# Patient Record
Sex: Female | Born: 1940 | Race: White | Hispanic: No | Smoking: Former smoker
Health system: Southern US, Community
[De-identification: ages and names within clinical notes are randomized; demographics above are authoritative.]

## PROBLEM LIST (undated history)

## (undated) DIAGNOSIS — M797 Fibromyalgia: Secondary | ICD-10-CM

## (undated) DIAGNOSIS — Z8619 Personal history of other infectious and parasitic diseases: Secondary | ICD-10-CM

## (undated) DIAGNOSIS — M1990 Unspecified osteoarthritis, unspecified site: Secondary | ICD-10-CM

## (undated) DIAGNOSIS — K5792 Diverticulitis of intestine, part unspecified, without perforation or abscess without bleeding: Secondary | ICD-10-CM

## (undated) DIAGNOSIS — H3530 Unspecified macular degeneration: Secondary | ICD-10-CM

## (undated) DIAGNOSIS — IMO0001 Reserved for inherently not codable concepts without codable children: Secondary | ICD-10-CM

## (undated) DIAGNOSIS — K219 Gastro-esophageal reflux disease without esophagitis: Secondary | ICD-10-CM

## (undated) DIAGNOSIS — I10 Essential (primary) hypertension: Secondary | ICD-10-CM

## (undated) DIAGNOSIS — I739 Peripheral vascular disease, unspecified: Secondary | ICD-10-CM

## (undated) DIAGNOSIS — R0600 Dyspnea, unspecified: Secondary | ICD-10-CM

## (undated) DIAGNOSIS — G459 Transient cerebral ischemic attack, unspecified: Secondary | ICD-10-CM

## (undated) DIAGNOSIS — I6529 Occlusion and stenosis of unspecified carotid artery: Secondary | ICD-10-CM

## (undated) DIAGNOSIS — I8390 Asymptomatic varicose veins of unspecified lower extremity: Secondary | ICD-10-CM

## (undated) DIAGNOSIS — I2510 Atherosclerotic heart disease of native coronary artery without angina pectoris: Secondary | ICD-10-CM

## (undated) DIAGNOSIS — IMO0002 Reserved for concepts with insufficient information to code with codable children: Secondary | ICD-10-CM

## (undated) DIAGNOSIS — C801 Malignant (primary) neoplasm, unspecified: Secondary | ICD-10-CM

## (undated) DIAGNOSIS — E785 Hyperlipidemia, unspecified: Secondary | ICD-10-CM

## (undated) DIAGNOSIS — H9190 Unspecified hearing loss, unspecified ear: Secondary | ICD-10-CM

## (undated) DIAGNOSIS — R011 Cardiac murmur, unspecified: Secondary | ICD-10-CM

## (undated) DIAGNOSIS — H269 Unspecified cataract: Secondary | ICD-10-CM

## (undated) HISTORY — DX: Unspecified cataract: H26.9

## (undated) HISTORY — DX: Unspecified hearing loss, unspecified ear: H91.90

## (undated) HISTORY — DX: Reserved for concepts with insufficient information to code with codable children: IMO0002

## (undated) HISTORY — DX: Transient cerebral ischemic attack, unspecified: G45.9

## (undated) HISTORY — DX: Personal history of other infectious and parasitic diseases: Z86.19

## (undated) HISTORY — DX: Reserved for inherently not codable concepts without codable children: IMO0001

## (undated) HISTORY — DX: Asymptomatic varicose veins of unspecified lower extremity: I83.90

## (undated) HISTORY — PX: OTHER SURGICAL HISTORY: SHX169

## (undated) HISTORY — DX: Essential (primary) hypertension: I10

## (undated) HISTORY — PX: BREAST BIOPSY: SHX20

## (undated) HISTORY — DX: Gastro-esophageal reflux disease without esophagitis: K21.9

## (undated) HISTORY — DX: Unspecified osteoarthritis, unspecified site: M19.90

## (undated) HISTORY — DX: Occlusion and stenosis of unspecified carotid artery: I65.29

## (undated) HISTORY — PX: APPENDECTOMY: SHX54

## (undated) HISTORY — DX: Atherosclerotic heart disease of native coronary artery without angina pectoris: I25.10

## (undated) HISTORY — DX: Malignant (primary) neoplasm, unspecified: C80.1

## (undated) HISTORY — DX: Diverticulitis of intestine, part unspecified, without perforation or abscess without bleeding: K57.92

## (undated) HISTORY — DX: Dyspnea, unspecified: R06.00

## (undated) HISTORY — DX: Hyperlipidemia, unspecified: E78.5

## (undated) HISTORY — DX: Peripheral vascular disease, unspecified: I73.9

## (undated) HISTORY — DX: Fibromyalgia: M79.7

## (undated) HISTORY — PX: ANGIOPLASTY: SHX39

## (undated) HISTORY — DX: Unspecified macular degeneration: H35.30

## (undated) HISTORY — PX: CARDIAC CATHETERIZATION: SHX172

---

## 1971-03-12 HISTORY — PX: TOTAL ABDOMINAL HYSTERECTOMY: SHX209

## 1986-03-11 HISTORY — PX: KIDNEY STONE SURGERY: SHX686

## 1995-03-12 HISTORY — PX: SPINE SURGERY: SHX786

## 2001-06-09 ENCOUNTER — Encounter: Payer: Self-pay | Admitting: Vascular Surgery

## 2001-06-11 ENCOUNTER — Ambulatory Visit (HOSPITAL_COMMUNITY): Admission: RE | Admit: 2001-06-11 | Discharge: 2001-06-11 | Payer: Self-pay | Admitting: Vascular Surgery

## 2001-07-02 ENCOUNTER — Ambulatory Visit (HOSPITAL_COMMUNITY): Admission: RE | Admit: 2001-07-02 | Discharge: 2001-07-03 | Payer: Self-pay | Admitting: Vascular Surgery

## 2003-01-03 ENCOUNTER — Ambulatory Visit (HOSPITAL_COMMUNITY): Admission: RE | Admit: 2003-01-03 | Discharge: 2003-01-03 | Payer: Self-pay | Admitting: Internal Medicine

## 2005-12-09 ENCOUNTER — Ambulatory Visit: Payer: Self-pay | Admitting: Cardiology

## 2005-12-13 ENCOUNTER — Ambulatory Visit: Payer: Self-pay | Admitting: Cardiology

## 2005-12-18 ENCOUNTER — Inpatient Hospital Stay (HOSPITAL_BASED_OUTPATIENT_CLINIC_OR_DEPARTMENT_OTHER): Admission: RE | Admit: 2005-12-18 | Discharge: 2005-12-18 | Payer: Self-pay | Admitting: Internal Medicine

## 2005-12-18 ENCOUNTER — Ambulatory Visit: Payer: Self-pay | Admitting: Cardiology

## 2006-01-16 ENCOUNTER — Ambulatory Visit: Payer: Self-pay | Admitting: Cardiology

## 2006-02-21 ENCOUNTER — Ambulatory Visit: Payer: Self-pay | Admitting: Cardiology

## 2006-04-02 ENCOUNTER — Encounter: Payer: Self-pay | Admitting: Cardiology

## 2006-08-19 ENCOUNTER — Ambulatory Visit: Payer: Self-pay | Admitting: Vascular Surgery

## 2006-11-25 ENCOUNTER — Ambulatory Visit: Payer: Self-pay | Admitting: Vascular Surgery

## 2006-11-28 ENCOUNTER — Ambulatory Visit: Payer: Self-pay

## 2006-12-03 ENCOUNTER — Ambulatory Visit: Payer: Self-pay | Admitting: Vascular Surgery

## 2006-12-03 ENCOUNTER — Inpatient Hospital Stay (HOSPITAL_COMMUNITY): Admission: RE | Admit: 2006-12-03 | Discharge: 2006-12-04 | Payer: Self-pay | Admitting: Vascular Surgery

## 2006-12-03 ENCOUNTER — Encounter: Payer: Self-pay | Admitting: Vascular Surgery

## 2006-12-03 HISTORY — PX: CAROTID ENDARTERECTOMY: SUR193

## 2006-12-23 ENCOUNTER — Ambulatory Visit: Payer: Self-pay | Admitting: Vascular Surgery

## 2007-03-12 HISTORY — PX: KNEE ARTHROSCOPY: SUR90

## 2007-06-30 ENCOUNTER — Ambulatory Visit: Payer: Self-pay | Admitting: Vascular Surgery

## 2007-12-07 ENCOUNTER — Encounter: Payer: Self-pay | Admitting: Cardiology

## 2008-01-08 ENCOUNTER — Ambulatory Visit: Payer: Self-pay | Admitting: Vascular Surgery

## 2008-02-19 ENCOUNTER — Encounter: Payer: Self-pay | Admitting: Cardiology

## 2008-05-19 ENCOUNTER — Encounter: Payer: Self-pay | Admitting: Cardiology

## 2008-07-26 ENCOUNTER — Ambulatory Visit: Payer: Self-pay | Admitting: Cardiology

## 2008-10-11 ENCOUNTER — Encounter: Payer: Self-pay | Admitting: Cardiology

## 2008-11-22 DIAGNOSIS — R0602 Shortness of breath: Secondary | ICD-10-CM | POA: Insufficient documentation

## 2008-11-22 DIAGNOSIS — E782 Mixed hyperlipidemia: Secondary | ICD-10-CM

## 2008-11-22 DIAGNOSIS — I10 Essential (primary) hypertension: Secondary | ICD-10-CM

## 2008-11-22 DIAGNOSIS — I739 Peripheral vascular disease, unspecified: Secondary | ICD-10-CM

## 2008-12-26 ENCOUNTER — Ambulatory Visit: Payer: Self-pay | Admitting: Vascular Surgery

## 2008-12-26 ENCOUNTER — Encounter: Payer: Self-pay | Admitting: Cardiology

## 2009-03-10 ENCOUNTER — Encounter: Payer: Self-pay | Admitting: Cardiology

## 2009-03-11 HISTORY — PX: JOINT REPLACEMENT: SHX530

## 2009-06-20 ENCOUNTER — Ambulatory Visit: Payer: Self-pay | Admitting: Vascular Surgery

## 2009-06-28 ENCOUNTER — Encounter: Payer: Self-pay | Admitting: Cardiology

## 2009-07-31 ENCOUNTER — Ambulatory Visit: Payer: Self-pay | Admitting: Cardiology

## 2009-07-31 DIAGNOSIS — I2510 Atherosclerotic heart disease of native coronary artery without angina pectoris: Secondary | ICD-10-CM

## 2010-01-19 ENCOUNTER — Telehealth (INDEPENDENT_AMBULATORY_CARE_PROVIDER_SITE_OTHER): Payer: Self-pay | Admitting: *Deleted

## 2010-01-26 ENCOUNTER — Ambulatory Visit: Payer: Self-pay | Admitting: Internal Medicine

## 2010-01-26 ENCOUNTER — Inpatient Hospital Stay (HOSPITAL_COMMUNITY)
Admission: RE | Admit: 2010-01-26 | Discharge: 2010-01-29 | Payer: Self-pay | Source: Home / Self Care | Admitting: Orthopedic Surgery

## 2010-02-27 ENCOUNTER — Ambulatory Visit: Payer: Self-pay | Admitting: Vascular Surgery

## 2010-03-16 ENCOUNTER — Telehealth (INDEPENDENT_AMBULATORY_CARE_PROVIDER_SITE_OTHER): Payer: Self-pay | Admitting: *Deleted

## 2010-04-12 NOTE — Progress Notes (Signed)
Summary: med change  Phone Note Call from Patient   Summary of Call: States she has been on Adalat CC 30mg .  Pharm has always filled with Nifediac.  Insurance will no longer cover this.  Okay to change to Nifedipine ER 30mg  daily.  Advised her that is probably okay, but will double check with MD.  Has about 6 pills left on old rx.  Patient verbalized understanding.  Initial call taken by: Hoover Brunette, LPN,  March 16, 2010 2:32 PM  Follow-up for Phone Call        yes no problem Follow-up by: Lewayne Bunting, MD, [facility],  March 16, 2010 2:51 PM  Additional Follow-up for Phone Call Additional follow up Details #1::        Patient notified.   Will send to [facility] at patient request. Additional Follow-up by: Hoover Brunette, LPN,  March 19, 2010 5:56 PM    New/Updated Medications: NIFEDIPINE 30 MG XR24H-TAB (NIFEDIPINE) Take 1 tablet by mouth once a day Prescriptions: NIFEDIPINE 30 MG XR24H-TAB (NIFEDIPINE) Take 1 tablet by mouth once a day  #30 x 6   Entered by:   Hoover Brunette, LPN   Authorized by:   Lewayne Bunting, MD, [facility]   Signed by:   Hoover Brunette, LPN on 52/84/1324   Method used:   Electronically to        [facility]* (retail)       [address]       [place], [place]  [address]       Ph: [phone]       Fax: [phone]   RxID:   [id number]

## 2010-04-12 NOTE — Assessment & Plan Note (Signed)
Summary: 1 YR FU PER MAY REMINDER   Visit Type:  Follow-up Primary Provider:  Dr. Kirstie Peri   History of Present Illness: the patient is a very pleasant 70 year old female with a history of single-vessel coronary artery disease with without recurrent chest pain. Her last cardiac catheterization was in 2007. Chest is of chronic dependent edema secondary to venous insufficiency. She also states her hypertension. She has chronic knee problems and is limited in her mobility. She has a historyof  vascular disease and is status post left carotid endarterectomy as well as angioplasty of the left lower extremity. She has type 2 diabetes mellitus hypertension and dyslipidemia.  The patient reports no cardiovascular symptoms. She denies any chest pain shortness of breath orthopnea PND. She does complain of significant fatigue which has worsened over the last 4 months. Unfortunately a significant insomnia and does not take her trazodone every night. Sometimes she takes melatonin. She has started exercising slowly but is unable to do much given her chronic knee problems. She also reports some indigestion likely related to her Fosamax. She also has fibromyalgias for which he takes Flexeril at times, which could also contribute to her fatigue.  Preventive Screening-Counseling & Management  Alcohol-Tobacco     Smoking Status: quit  Comments: quit smoking about 25 yrs ago, started smoking at 70 yrs old  Current Medications (verified): 1)  Prilosec Otc 20 Mg Tbec (Omeprazole Magnesium) .... Take 1 Tablet By Mouth Once A Day 2)  Demadex 20 Mg Tabs (Torsemide) .... Take 1-2 Tablets By Mouth Once Daily. 3)  Valturna 300-320 Mg Tabs (Aliskiren-Valsartan) .... Take 1 Tablet By Mouth Once A Day 4)  Crestor 10 Mg Tabs (Rosuvastatin Calcium) .... Take One By Mouth 3 Times Week 5)  Metoprolol Succinate 25 Mg Xr24h-Tab (Metoprolol Succinate) .... Take 1 Tablet By Mouth Once A Day 6)  Trazodone Hcl 50 Mg Tabs  (Trazodone Hcl) .... Take 1 Tablet By Mouth Once A Day As Needed 7)  Caltrate 600+d 600-400 Mg-Unit Tabs (Calcium Carbonate-Vitamin D) .... Take 1 Tablet By Mouth Four Times A Day 8)  Mag-Ox 400 400 Mg Tabs (Magnesium Oxide) .... Take 1 Tablet By Mouth Once A Day 9)  Folic Acid 800 Mcg Tabs (Folic Acid) .... Take 1 Tablet By Mouth Once A Day 10)  Fish Oil 1000 Mg Caps (Omega-3 Fatty Acids) .... Take 1 Tablet By Mouth Once A Day 11)  Ecotrin 325 Mg Tbec (Aspirin) .... Take 1 Tablet By Mouth Once A Day 12)  Multivitamins  Tabs (Multiple Vitamin) .... Take 1 Tablet By Mouth Once A Day 13)  Osteo Bi-Flex Regular Strength 250-200 Mg Tabs (Glucosamine-Chondroitin) .... Take 2 Tablets By Mouth Once A Day 14)  Vitamin D 1000 Unit Tabs (Cholecalciferol) .... Take 2 Tablet By Mouth Once A Day 15)  Adalat Cc 30 Mg Xr24h-Tab (Nifedipine) .... Take 1 Tablet By Mouth Once A Day 16)  Clonidine Hcl 0.1 Mg Tabs (Clonidine Hcl) .... Take One Tablet By Mouth Once Daily 17)  Januvia 100 Mg Tabs (Sitagliptin Phosphate) .... Take 1 Tablet By Mouth Once A Day 18)  Cyclobenzaprine Hcl 10 Mg Tabs (Cyclobenzaprine Hcl) .... Take 1 Tab By Mouth At Bedtime 19)  Alendronate Sodium 70 Mg Tabs (Alendronate Sodium) .... Take 1 Tablet By Mouth Once Per Week. 20)  Oxycodone-Acetaminophen 5-325 Mg Tabs (Oxycodone-Acetaminophen) .... As Needed 21)  Gaviscon Extra Relief Formula 160-105 Mg Chew (Alum Hydroxide-Mag Carbonate) .... As Needed  Allergies (verified): 1)  Septra Ds 2)  Erythromycin 3)  Motrin 4)  Prednisone 5)  Naprosyn 6)  Augmentin  Comments:  Nurse/Medical Assistant: The patient's medications were reviewed with the patient and were updated in the Medication List. Pt brought a list of medications to office visit.  Cyril Loosen, RN, BSN (Jul 31, 2009 10:28 AM)  Past History:  Past Medical History: Last updated: 11/22/2008 HYPERTENSION, UNSPECIFIED (ICD-401.9) HYPERLIPIDEMIA-MIXED (ICD-272.4) PVD  (ICD-443.9) DYSPNEA (ICD-786.05) Type 2 diabetes mellitus  Past Surgical History: Last updated: 11/22/2008 Breast biopsy.  Kidney stones.  Aspiration of a cyst in the right breast.  Abdominal Hysterectomy-Total Appendectomy Back Surgery  Family History: Last updated: 11/22/2008 Family History of Coronary Artery Disease:  Family History of CVA or Stroke:   Social History: Last updated: 11/22/2008 Retired  Married  Tobacco Use - Former.  Alcohol Use - no  Risk Factors: Smoking Status: quit (07/31/2009)  Vital Signs:  Patient profile:   70 year old female Height:      63 inches Weight:      209.50 pounds BMI:     37.25 Pulse rate:   60 / minute BP sitting:   116 / 79  (left arm) Cuff size:   large  Vitals Entered By: Cyril Loosen, RN, BSN (Jul 31, 2009 10:16 AM)  Nutrition Counseling: Patient's BMI is greater than 25 and therefore counseled on weight management options. Comments 1 yr follow up    Physical Exam  Additional Exam:  General: Well-developed, well-nourished in no distress head: Normocephalic and atraumatic eyes PERRLA/EOMI intact, conjunctiva and lids normal nose: No deformity or lesions mouth normal dentition, normal posterior pharynx neck: Supple, no JVD.  No masses, thyromegaly or abnormal cervical nodesStatus post left carotid endarterectomy lungs: Normal breath sounds bilaterally without wheezing.  Normal percussion heart: regular rate and rhythm with normal S1 and S2, no S3 or S4.  PMI is normal.  No pathological murmurs abdomen: Normal bowel sounds, abdomen is soft and nontender without masses, organomegaly or hernias noted.  No hepatosplenomegaly musculoskeletal: Back normal, normal gait muscle strength and tone normal pulsus: Pulse is normal in all 4 extremities Extremities: No peripheral pitting edema neurologic: Alert and oriented x 3 skin: Intact without lesions or rashes cervical nodes: No significant adenopathy psychologic: Normal  affect    Impression & Recommendations:  Problem # 1:  HYPERTENSION, UNSPECIFIED (ICD-401.9) nifedipine was refilled today The following medications were removed from the medication list:    Amlodipine Besylate 5 Mg Tabs (Amlodipine besylate) .Marland Kitchen... Take 1 tablet by mouth once a day Her updated medication list for this problem includes:    Demadex 20 Mg Tabs (Torsemide) .Marland Kitchen... Take 1-2 tablets by mouth once daily.    Valturna 300-320 Mg Tabs (Aliskiren-valsartan) .Marland Kitchen... Take 1 tablet by mouth once a day    Metoprolol Succinate 25 Mg Xr24h-tab (Metoprolol succinate) .Marland Kitchen... Take 1 tablet by mouth once a day    Ecotrin 325 Mg Tbec (Aspirin) .Marland Kitchen... Take 1 tablet by mouth once a day    Adalat Cc 30 Mg Xr24h-tab (Nifedipine) .Marland Kitchen... Take 1 tablet by mouth once a day    Clonidine Hcl 0.1 Mg Tabs (Clonidine hcl) .Marland Kitchen... Take one tablet by mouth once daily  Problem # 2:  PVD (ICD-443.9) Assessment: Comment Only status post left carotid endarterectomy and status post PTA of the left lower extremity.  Problem # 3:  CAD (ICD-414.00) single-vessel coronary artery is a snow recurrent chest pain. Continue current medical therapy The following medications were removed from the medication list:  Amlodipine Besylate 5 Mg Tabs (Amlodipine besylate) .Marland Kitchen... Take 1 tablet by mouth once a day Her updated medication list for this problem includes:    Metoprolol Succinate 25 Mg Xr24h-tab (Metoprolol succinate) .Marland Kitchen... Take 1 tablet by mouth once a day    Ecotrin 325 Mg Tbec (Aspirin) .Marland Kitchen... Take 1 tablet by mouth once a day    Adalat Cc 30 Mg Xr24h-tab (Nifedipine) .Marland Kitchen... Take 1 tablet by mouth once a day  Patient Instructions: 1)  Your physician recommends that you continue on your current medications as directed. Please refer to the Current Medication list given to you today. 2)  Follow up in  1 year Prescriptions: ADALAT CC 30 MG XR24H-TAB (NIFEDIPINE) Take 1 tablet by mouth once a day  #30 x 11   Entered by:    Hoover Brunette, LPN   Authorized by:   Lewayne Bunting, MD, [facility]   Signed by:   Hoover Brunette, LPN on 16/12/9602   Method used:   Electronically to        [facility]* (retail)       [address]       [place], [place]  [address]       Ph: [phone]       Fax: [phone]   RxID:   [id number]

## 2010-04-12 NOTE — Progress Notes (Signed)
Summary: Records Request  Faxed 2008 Stress to [facility] [place] [facility] ([phone]). Debby Freiberg  January 19, 2010 11:59 AM

## 2010-05-22 LAB — BASIC METABOLIC PANEL
BUN: 14 mg/dL (ref 6–23)
CO2: 25 mEq/L (ref 19–32)
Calcium: 8.3 mg/dL — ABNORMAL LOW (ref 8.4–10.5)
Chloride: 103 mEq/L (ref 96–112)
Creatinine, Ser: 1.24 mg/dL — ABNORMAL HIGH (ref 0.4–1.2)
GFR calc Af Amer: 56 mL/min — ABNORMAL LOW (ref >60)
GFR calc non Af Amer: 46 mL/min — ABNORMAL LOW (ref >60)
Glucose, Bld: 156 mg/dL — ABNORMAL HIGH (ref 70–99)
Potassium: 3.7 mEq/L (ref 3.5–5.1)
Potassium: 4.1 mEq/L (ref 3.5–5.1)
Sodium: 139 mEq/L (ref 135–145)

## 2010-05-22 LAB — PROTIME-INR
INR: 0.98 (ref 0.00–1.49)
INR: 1.13 (ref 0.00–1.49)
Prothrombin Time: 13.2 seconds (ref 11.6–15.2)
Prothrombin Time: 14.7 seconds (ref 11.6–15.2)

## 2010-05-22 LAB — CBC
HCT: 23.5 % — ABNORMAL LOW (ref 36.0–46.0)
HCT: 25 % — ABNORMAL LOW (ref 36.0–46.0)
HCT: 26 % — ABNORMAL LOW (ref 36.0–46.0)
HCT: 33.8 % — ABNORMAL LOW (ref 36.0–46.0)
Hemoglobin: 9.1 g/dL — ABNORMAL LOW (ref 12.0–15.0)
MCH: 31.7 pg (ref 26.0–34.0)
MCH: 32 pg (ref 26.0–34.0)
MCHC: 34.9 g/dL (ref 30.0–36.0)
MCHC: 35.2 g/dL (ref 30.0–36.0)
MCV: 92.5 fL (ref 78.0–100.0)
MCV: 92.7 fL (ref 78.0–100.0)
MCV: 92.9 fL (ref 78.0–100.0)
Platelets: 221 10*3/uL (ref 150–400)
Platelets: 327 10*3/uL (ref 150–400)
RBC: 2.83 MIL/uL — ABNORMAL LOW (ref 3.87–5.11)
RBC: 3.64 MIL/uL — ABNORMAL LOW (ref 3.87–5.11)
RDW: 14.4 % (ref 11.5–15.5)
RDW: 14.6 % (ref 11.5–15.5)
WBC: 10 10*3/uL (ref 4.0–10.5)
WBC: 8.9 10*3/uL (ref 4.0–10.5)

## 2010-05-22 LAB — URINALYSIS, ROUTINE W REFLEX MICROSCOPIC
Bilirubin Urine: NEGATIVE
Glucose, UA: NEGATIVE mg/dL
Ketones, ur: NEGATIVE mg/dL
Protein, ur: NEGATIVE mg/dL

## 2010-05-22 LAB — GLUCOSE, CAPILLARY
Glucose-Capillary: 117 mg/dL — ABNORMAL HIGH (ref 70–99)
Glucose-Capillary: 132 mg/dL — ABNORMAL HIGH (ref 70–99)
Glucose-Capillary: 137 mg/dL — ABNORMAL HIGH (ref 70–99)
Glucose-Capillary: 138 mg/dL — ABNORMAL HIGH (ref 70–99)
Glucose-Capillary: 150 mg/dL — ABNORMAL HIGH (ref 70–99)
Glucose-Capillary: 156 mg/dL — ABNORMAL HIGH (ref 70–99)
Glucose-Capillary: 165 mg/dL — ABNORMAL HIGH (ref 70–99)
Glucose-Capillary: 170 mg/dL — ABNORMAL HIGH (ref 70–99)
Glucose-Capillary: 175 mg/dL — ABNORMAL HIGH (ref 70–99)

## 2010-05-22 LAB — COMPREHENSIVE METABOLIC PANEL
Alkaline Phosphatase: 56 U/L (ref 39–117)
BUN: 25 mg/dL — ABNORMAL HIGH (ref 6–23)
CO2: 30 mEq/L (ref 19–32)
Chloride: 103 mEq/L (ref 96–112)
Creatinine, Ser: 1.19 mg/dL (ref 0.4–1.2)
GFR calc non Af Amer: 45 mL/min — ABNORMAL LOW (ref >60)
Glucose, Bld: 107 mg/dL — ABNORMAL HIGH (ref 70–99)
Potassium: 4.9 mEq/L (ref 3.5–5.1)
Total Bilirubin: 0.5 mg/dL (ref 0.3–1.2)

## 2010-05-22 LAB — URINE MICROSCOPIC-ADD ON

## 2010-05-22 LAB — TYPE AND SCREEN
ABO/RH(D): A POS
Antibody Screen: NEGATIVE

## 2010-05-22 LAB — APTT: aPTT: 29 seconds (ref 24–37)

## 2010-07-24 NOTE — Procedures (Signed)
CAROTID DUPLEX EXAM   INDICATION:  Follow up carotid disease, left side endarterectomy.   HISTORY:  Diabetes:  yes  Cardiac:  no  Hypertension:  yes  Smoking:  Previous, quit 25 years ago  Previous Surgery:  Left CEA on 11/30/2006  CV History:  Multiple TIAs  Amaurosis Fugax No, Paresthesias No, Hemiparesis No                                       RIGHT             LEFT  Brachial systolic pressure:         167               153  Brachial Doppler waveforms:         wnl               wnl  Vertebral direction of flow:        antegrade         antegrade  DUPLEX VELOCITIES (cm/sec)  CCA peak systolic                   124               121  ECA peak systolic                   212               143  ICA peak systolic                   94                154  ICA end diastolic                   25                23  PLAQUE MORPHOLOGY:                  calcific          heterogenous  PLAQUE AMOUNT:                      minimal           minimal  PLAQUE LOCATION:                    ICA/ECA           ICA   IMPRESSION:  1. Right internal carotid artery shows evidence of 20% to 39%      stenosis.  2. Patent left endarterectomy site.  3. Left internal carotid artery shows evidence of 40% to 59% stenosis      (low end of range).  4. Bilateral external carotid artery stenosis.   ___________________________________________  [name], M.D.   CB/MEDQ  D:  12/26/2008  T:  12/27/2008  Job:  [id number]

## 2010-07-24 NOTE — H&P (Signed)
HISTORY AND PHYSICAL EXAMINATION   November 25, 2006   Re:  Sava, [place] L                  DOB:  Feb 28, 1941   CHIEF COMPLAINT:  Severe left internal carotid stenosis, asymptomatic.   HISTORY OF PRESENT ILLNESS:  This 70 year old female patient has a  history of vascular occlusive disease, having previously undergone a PTA  of her left superficial femoral artery by Dr. Hart Rochester in 2003 for severe  claudication symptoms in the left calf.  These symptoms have remained  relieved following that procedure.  She has been known to have some  carotid occlusive disease and has been followed by Dr. Eliberto Ivory  previously, and she recently had a duplex scan ordered by Dr. Sherryll Burger on  September 15th which revealed severe progression of disease in her left  internal carotid artery to a 90% stenosis and severity.  She has minimal  disease on the right side.  She denied any hemispheric __________, TIAs,  amaurosis fugax, diplopia, blurred vision, or syncope.  She is now  scheduled for an elective left carotid endarterectomy for this  asymptomatic lesion.   PAST MEDICAL HISTORY:  1. Type 2 diabetes mellitus.  2. Hypertension.  3. Hyperlipidemia.  4. Coronary artery disease.  Previous cardiac cath by Dr. Andee Lineman with      a few blockages which were not severe.  She is currently      asymptomatic.  5. Negative for COPD, stroke.   PAST SURGICAL HISTORY:  1. Hysterectomy.  2. Appendectomy.  3. Breast biopsy.  4. Kidney stones.  5. Aspiration of a cyst in the right breast.  6. Surgery on the cervical disk at C5-6.  7. Drainage of an abscess and appendectomy.   FAMILY HISTORY:  Positive for stroke in her mother.  Coronary artery  disease and diabetes in her father as well as a stroke in her father.   SOCIAL HISTORY:  She is married, has one child, and is retired.  She has  not smoked in 21 years and does not use alcohol.   REVIEW OF SYSTEMS:  Denies anorexia, weight loss, fever,  chest pain,  dyspnea on exertion but does have some palpitations on occasion.  She  also has symptoms of reflux esophagitis on occasion.   ALLERGIES:  She has sensitivity to SEPTRA DS, ERYTHROMYCIN, MOTRIN,  PREDNISONE, NAPROSYN, AUGMENTIN, METOPROLOL.   PREVIOUS MEDICATIONS:  1. Prilosec OTC 20 mg a day.  2. Demadex 20 mg a day.  3. Sular 40 mg a day.  4. Diovan 320 mg a day.  5. Glucophage XR 500 mg a day.  6. Crestor 10 mg a day.  7. Propoxyphene 65/650 p.r.n.  8. Oxycodone 5/325 p.r.n.  9. Ambien 10 mg 3-4 per month.  10.Citrucel supplement with vitamin D 500 mg 2 daily.  11.Omega III 1000 mg a day.  12.Ecotrin 325 mg a day.  13.Women's Golden multivitamin with minerals 1 every other day.  14.Gaviscon p.r.n.  15.Atenolol 25 mg per day.   PHYSICAL EXAMINATION:  Blood pressure is 122/60, heart rate 68,  respirations 16.  GENERAL:  She is a healthy-appearing female in no apparent distress.  Alert and oriented x3.  NECK:  Supple with 3+ carotid pulses.  There is a soft bruit on the left  side.  No palpable adenopathy in the neck.  NEUROLOGIC:  Normal.  CHEST:  Clear to auscultation.  CARDIOVASCULAR:  Regular rhythm with no murmurs.  ABDOMEN:  Soft and nontender with no masses.  She has 3+ femoral and 2+ popliteal pulses palpable bilaterally.  Both  feet are well perfused.   I reviewed the carotid duplex exam performed at [facility],  which revealed a 90% left internal carotid stenosis and mild flow  reduction on the right side.   IMPRESSION:  1. Left internal carotid stenosis, severe.  Asymptomatic.  2. Type 2 diabetes mellitus.  3. Hypertension.  4. Hyperlipidemia.  5. Coronary artery disease, currently asymptomatic.   PLAN:  Admit the patient on September 24th for an elective left carotid  endarterectomy.  She will have a Cardiolite study performed between now  and that date, to be reviewed by the cardiologist to see if any  preoperative evaluation is  indicated.  Risks and benefits of the surgery  were thoroughly discussed, and she would like to proceed.   [name], M.D.  Electronically Signed   JDL/MEDQ  D:  11/25/2006  T:  11/26/2006  Job:  [id number]   cc:   Kirstie Peri, MD

## 2010-07-24 NOTE — Procedures (Signed)
CAROTID DUPLEX EXAM   INDICATION:  Follow up carotid artery disease.   HISTORY:  Diabetes:  Yes.  Cardiac:  Murmur.  Hypertension:  Yes.  Smoking:  Quit.  Previous Surgery:  Left CEA with DPA on 11/30/2006 by Dr. Hart Rochester.  CV History:  No.  Amaurosis Fugax No, Paresthesias No, Hemiparesis No.                                       RIGHT             LEFT  Brachial systolic pressure:         153               139  Brachial Doppler waveforms:         Biphasic          Biphasic  Vertebral direction of flow:        Antegrade         Antegrade  DUPLEX VELOCITIES (cm/sec)  CCA peak systolic                   85                108  ECA peak systolic                   214               195  ICA peak systolic                   93                143  ICA end diastolic                   29                45  PLAQUE MORPHOLOGY:                  Calcific          Homogenous  PLAQUE AMOUNT:                      Minimal           Minimal  PLAQUE LOCATION:                    ICA               ICA   IMPRESSION:  1. Right internal carotid artery shows evidence of 20-39% stenosis.  2. Left internal carotid artery velocities, status post carotid      endarterectomy, are suggestive of 40-59% stenosis (low end of      range).  3. No significant internal carotid artery velocity changes from      previous study.  4. Bilateral external carotid artery stenosis.   ___________________________________________  [name], M.D.   AS/MEDQ  D:  01/08/2008  T:  01/08/2008  Job:  [id number]

## 2010-07-24 NOTE — Assessment & Plan Note (Signed)
[facility]                          [facility] OFFICE NOTE   NAME:Canizares, DONOVAN GATCHEL                         MRN:          [id number]  DATE:07/26/2008                            DOB:          Dec 01, 1940    HISTORY OF PRESENT ILLNESS:  The patient is a 70 year old female with a  history of nonobstructive coronary artery disease.  The patient  underwent cardiac catheterization in 2007.  She is here for routine  followup.  Unfortunately, she remains quite hypertensive.  She denies  any chest pain.  She does report shortness of breath on moderate  exertion.  This has been a chronic problem; however, she has not seen a  change in her exercise tolerance overall.  Her husband reminded that our  office did not call for routine followup in 1 year.  Prior  echocardiographic study has also shown normal cardiac function and no  valvular heart disease.   MEDICATIONS:  1. Prilosec over-the-counter 20 mg p.o. daily.  2. Demadex 20 mg daily may take an extra p.r.n.  3. Valturna 300/320 mg p.o. daily.  4. Active plus met 50/850 mg p.o. daily.  5. Crestor 10 mg 3 times a week.  6. Metoprolol ER 50 mg p.o. daily.  7. Trazodone 50 mg p.o. nightly.  8. Calcium.  9. Vitamin D.  10.Magnesium t.i.d.  11.Folic acid 800 mcg p.o. daily.  12.Omega-3 fish oil.  13.Ecotrin 325 daily.  14.Multivitamin.  15.Osteo Bi-Flex daily.  16.Vitamin D 2000 units daily.   PHYSICAL EXAMINATION:  VITAL SIGNS:  Blood pressure is 186/88 in the  left arm and 110/78.  On repeat, heart rate 49, weight 197 pounds.  GENERAL:  Well-nourished, white female, in no apparent distress.  HEENT:  Pupils, eyes are clear.  Conjunctivae clear.  NECK:  Supple.  Normal carotid upstroke and no carotid bruits.  LUNGS:  Clear breath sounds bilaterally.  HEART:  Regular rate and rhythm with normal S1 and S2, and no murmur,  rubs, or gallops.  ABDOMEN:  Soft, nontender with no rebound or guarding.  Good  bowel  sounds.  EXTREMITIES:  No cyanosis, clubbing, or edema.   PROBLEM LIST:  1. Dyspnea stable.      a.     Nonobstructive coronary artery disease by catheterization       (70% mid right coronary artery stenosis).      b.     Normal left ventricular function.      c.     Deconditioning.      d.     Limited mobility due to chronic right knee condition.  2. Peripheral vascular disease.      a.     Status post left carotid endarterectomy.      b.     Status post percutaneous transluminal angioplasty left lower       extremity by Dr. Hart Rochester.  3. Type 2 diabetes mellitus.  4. Hypertension.  5. Dyslipidemia.   PLAN:  1. The patient's blood pressure is poorly controlled.  We will add      Adalat 30 mg  p.o. daily to her medical regimen given the fact that      she responds very well to Sular in the past, but became too      expensive as it was not covered by her insurance plan.  2. In 1-year from now, the patient will need a repeat stress test, but      this can be held off on for now.  3. We will decrease the patient's metoprolol to 25 mg p.o. daily.      Learta Codding, MD,FACC  Electronically Signed    GED/MedQ  DD: 07/26/2008  DT: 07/27/2008  Job #: [id number]   cc:   Kirstie Peri, MD

## 2010-07-24 NOTE — Procedures (Signed)
CAROTID DUPLEX EXAM   INDICATION:  Follow-up evaluation of known carotid artery disease.   HISTORY:  Diabetes:  Yes.  Cardiac:  No.  Hypertension:  Yes.  Smoking:  Previous smoker.  Previous Surgery:  Left carotid endarterectomy with Dacron patch  angioplasty on 11/30/06 by Dr. Hart Rochester.  CV History:  Previous duplex (preop):  0-39% right ICA stenosis and 80-  99% left ICA stenosis.  Amaurosis Fugax No, Paresthesias No, Hemiparesis No                                       RIGHT             LEFT  Brachial systolic pressure:         132               125  Brachial Doppler waveforms:         Triphasic         Triphasic  Vertebral direction of flow:        Antegrade         Antegrade  DUPLEX VELOCITIES (cm/sec)  CCA peak systolic                   91                104  ECA peak systolic                   285               154  ICA peak systolic                   85                118  ICA end diastolic                   23                38  PLAQUE MORPHOLOGY:                  Mixed             None  PLAQUE AMOUNT:                      Mild              None  PLAQUE LOCATION:                    Proximal ICA      None   IMPRESSION:  1. 20-39% right internal carotid artery stenosis.  2. No left internal carotid artery stenosis, status post      endarterectomy.   ___________________________________________  [name], M.D.   MC/MEDQ  D:  06/30/2007  T:  06/30/2007  Job:  [id number]

## 2010-07-24 NOTE — Procedures (Signed)
CAROTID DUPLEX EXAM   INDICATION:  Followup carotid artery disease, left carotid  endarterectomy.   HISTORY:  Diabetes:  Yes.  Cardiac:  No.  Hypertension:  Yes.  Smoking:  Previous.  Previous Surgery:  Left carotid endarterectomy 11/30/2006.  CV History:  The patient is currently asymptomatic.  Amaurosis Fugax No, Paresthesias No, Hemiparesis No                                       RIGHT             LEFT  Brachial systolic pressure:         168               165  Brachial Doppler waveforms:         WNL               WNL  Vertebral direction of flow:        Antegrade         Antegrade  DUPLEX VELOCITIES (cm/sec)  CCA peak systolic                   73                73  ECA peak systolic                   172               128  ICA peak systolic                   91                121  ICA end diastolic                   27                32  PLAQUE MORPHOLOGY:                  Heterogeneous  PLAQUE AMOUNT:                      Minimal  PLAQUE LOCATION:                    ICA and ECA   IMPRESSION:  Right internal carotid artery no hemodynamically  significant stenosis.  Patent and durable left carotid endarterectomy.  The left internal carotid artery shows evidence of 40%-59% stenosis (the  low end of range).  This is secondary to vessel tortuosity.  Right  external carotid artery stenosis.  A little bit of intimal thickening  within the right common carotid artery.   ___________________________________________  [name], M.D.   OD/MEDQ  D:  02/27/2010  T:  02/27/2010  Job:  [id number]

## 2010-07-24 NOTE — Discharge Summary (Signed)
Debra Hopkins, Debra Hopkins                  ACCOUNT NO.:  [id number]   MEDICAL RECORD NO.:  [id number]          PATIENT TYPE:  INP   LOCATION:  3312                         FACILITY:  [facility]   PHYSICIAN:  Quita Skye. Hart Rochester, M.D.  DATE OF BIRTH:  10-23-1940   DATE OF ADMISSION:  12/03/2006  DATE OF DISCHARGE:  12/04/2006                               DISCHARGE SUMMARY   FINAL DIAGNOSES:  1. Severe left internal carotid stenosis - asymptomatic.  2. Type-2 diabetes mellitus.  3. Hypertension.  4. Hyperlipidemia.  5. Coronary artery disease, currently asymptomatic.   HISTORY AND PRESENT ILLNESS:  This 70 year old female with a history of  vascular occlusive disease has previously undergone a left superficial  femoral PTA by Dr. Hart Rochester in 2003 for claudication symptoms in the left  calf, and these symptoms have continued to be relieved.  She has been  known to have carotid occlusive disease which has been followed by  duplex scanning, and a recent scan ordered by Dr. Sherryll Burger in September,  2008, revealed severe progression of disease on the left side with a  greater than 80-90% stenosis.  She has had no neurologic symptoms such  as hemiparesis, aphasia, amaurosis fugax, diplopia, blurred vision or  syncope; and she was scheduled for an elective left carotid  endarterectomy for this symptomatic lesion.   PAST MEDICAL HISTORY:  As above.   PREVIOUS SURGERY:  Includes hysterectomy, appendectomy, breast biopsy  and kidney stones.   HOSPITAL COURSE:  The patient was admitted on December 03, 2006, and  taken to the operating room for an elective left carotid endarterectomy.  The procedure was uneventful with Dr. Hart Rochester performing the procedure  and Dr. Durene Cal being the first assistant.  The patient awoke  promptly in the recovery room with no central neurologic deficits with a  stable blood pressure requiring no pressors.  She did have a mild left  marginal mandibular nerve paresis which should  resolve over the next few  weeks.  She was transferred to 3300 after a few hours of monitoring in  the Post-Anesthesia Care Unit and was stable hemodynamically.  She  swallowed well, tolerated her diet, ambulated with assistance and  independently.  On the first postoperative day, she had her IV  discontinued; voided without difficulty.  She was discharged with  instructions to cleanse the incision with soap and water and not drive  an automobile for 2 weeks and to gradually increase her activity level.   DISCHARGE MEDICATIONS:  1. Prilosec OTC 20 mg a day.  2. Demadex 20 mg a day.  3. Sular 40 mg a day.  4. Diovan 320 mg a day.  5. Glucophage XR 500 mg a day.  6. Crestor 10 mg a day.  7. Propoxyphene 65/650 p.r.n.  8. Oxycodone 5/325 p.r.n.  9. Ambien 10 mg 3-4 per month p.r.n. sleep.  10.Citrucel supplement with vitamin D 500 mg 2 daily.  11.Omega-3 at 1000 mg a day.  12.Ecotrin 325 mg a day.  13.Women's Golden Multivitamin with minerals 1 every other day.  14.Gaviscon p.r.n.  15.Atenolol 25  mg per day.  16.Tylox p.r.n. for pain.   PROCEDURES PERFORMED DURING HOSPITALIZATION:  On December 03, 2006, she  had a left carotid endarterectomy with Dacron patch angioplasty.  Surgeon Hart Rochester; first assistant Dr. Myra Gianotti.   FINAL DIAGNOSES:  As above.   FOLLOWUP:  The patient will return to see Dr. Hart Rochester in his office in 2  weeks for followup.   DISCHARGE CONDITION:  Improved.      [name], M.D.  Electronically Signed     JDL/MEDQ  D:  12/03/2006  T:  12/04/2006  Job:  [id number]

## 2010-07-24 NOTE — Assessment & Plan Note (Signed)
OFFICE VISIT   BISWAS, Baby L  DOB:  [birth year]                                       12/23/2006  EAVWU#:[id number]   OFFICE NOTE   The patient is status post left carotid endarterectomy and Dacron patch  angioplasty done on December 03, 2006 for severe, but asymptomatic left  internal carotid stenosis.  She has had no neurologic complications or  specific complaints since her discharge.  She swallows well, has had no  hoarseness and has resumed her aspirin 325 mg per day.  She did develop  a mild left marginal mandibular nerve paresis which I have discussed  with her and should resolve over the next few months.   On examination, blood pressure 170/64, heart rate 72, respirations 16.  Left neck incision is healed nicely.  Carotid pulses are 3+ with no  audible bruit on the left, a soft bruit on the right.  Neurologic exam  is normal.   Her carotid duplex exam preoperatively revealed some mild right external  carotid stenosis, but no significant internal carotid stenosis.  I have  reassured her regarding her progress and we will see her back in 6  months with a follow-up carotid duplex exam at that time unless she  develops any symptoms again.   [name], M.D.  Electronically Signed   JDL/MEDQ  D:  12/23/2006  T:  12/24/2006  Job:  [id number]

## 2010-07-24 NOTE — Assessment & Plan Note (Signed)
OFFICE VISIT   [name], Debra Hopkins  DOB:  March 19, 1940                                       06/30/2007  VZDGL#:[id number]   The patient is 6 months status post left carotid endarterectomy for  asymptomatic severe left internal carotid stenosis.  She had no  neurologic symptoms including hemiparesis, aphasia, amaurosis fugax,  diplopia, blurred vision or syncope.  She has some numbness anterior to  her left neck incision which is slightly improving and has had some  cramping in the left posterior aspect of her neck.  She does have  history of shoulder problems.  She also has no claudication symptoms in  her left calf having undergone PTA of left superficial femoral artery by  me in 2003.   His present blood pressure 145/79, heart rate 59, respirations 14.  Her  left neck incision is healing nicely with a prominent scar, but no  keloid or infection.  She has excellent carotid pulses with no bruits  audible.  Neurologic:  Normal.  Chest:  Clear to auscultation.  Abdomen:  Soft, nontender, no palpable masses.  She has 3+ femoral pulses  bilaterally with well-perfused lower extremities.   Carotid duplex exam reveals widely patent left internal carotid  endarterectomy site and right internal carotid with mild occlusive  disease.  Lower extremity ABIs are 1.0.   She is reassured regarding these findings.  She will continue to take  one Ecotrin daily and return in 6 months for carotid protocol.   [name], M.D.  Electronically Signed   JDL/MEDQ  D:  06/30/2007  T:  07/01/2007  Job:  [id number]   cc:   Kirstie Peri, MD

## 2010-07-24 NOTE — Op Note (Signed)
NAMELILLEIGH, Debra Hopkins                  ACCOUNT NO.:  [id number]   MEDICAL RECORD NO.:  [id number]          PATIENT TYPE:  INP   LOCATION:  3312                         FACILITY:  [facility]   PHYSICIAN:  Quita Skye. Hart Rochester, M.D.  DATE OF BIRTH:  1940/08/18   DATE OF PROCEDURE:  12/03/2006  DATE OF DISCHARGE:                               OPERATIVE REPORT   PREOPERATIVE DIAGNOSIS:  Severe left internal carotid stenosis -  asymptomatic.   POSTOPERATIVE DIAGNOSIS:  Severe left internal carotid stenosis -  asymptomatic.   OPERATION:  Left carotid endarterectomy with Dacron patch angioplasty.   SURGEON:  Quita Skye. Hart Rochester, M.D.   FIRST ASSISTANT:  1. Charlena Cross, M.D.   ANESTHESIA:  General endotracheal.   BRIEF HISTORY:  This patient has been followed with carotid occlusive  disease and a recent duplex scan revealed progression of disease to an  80-90% severity.  She is asymptomatic and has minimal disease in the  contralateral right side and is scheduled for left carotid  endarterectomy.   PROCEDURE:  The patient was taken the operating room and placed in the  supine position at which time satisfactory general endotracheal  anesthesia was administered.  The left neck was prepped with Betadine  scrub solution and draped in a routine sterile manner.  An incision was  made along the anterior border of the sternocleidomastoid muscle and  carried down through the subcutaneous tissue and platysma using the  Bovie.  The common facial vein and external jugular vein was ligated  with 3-0 silk ties and divided exposing the common, internal, and  external carotid arteries.  Care was taken not to injure the vagus or  hypoglossal nerves, both which were exposed.  There was calcified  atherosclerotic plaque at the carotid bifurcation extending up about 3  cm posteriorly, the distal vessel appeared normal.  A #10 shunt was  prepared and the patient was heparinized.   The carotid vessels were  occluded with vascular clamps.  A longitudinal  opening was made in the common carotid with a 15 blade and extended up  the internal carotid with Potts scissors to a point distal to the  disease.  A #10 shunt was inserted without difficulty re-establishing  flow in about two minutes.  Standard endarterectomy was then performed  using the elevator and Potts scissors with an eversion endarterectomy of  the external carotid.  The plaque feathered off in the distal internal  carotid artery nicely not requiring any tacking sutures.  The lumen was  thoroughly irrigated with heparin saline.  All loose debris carefully  removed and arteriotomy was closed with a patch using continuous 6-0  Prolene.  Prior to completion of closure, the shunt was removed after  about 30 minutes shunt time. Following antegrade and retrograde  flushing, closure was completed, re-establishing flow initially up the  external and then the internal branch.  The carotid was occluded for  less than two minutes for removal of the shunt.   Protamine was then given to reverse the heparin. Following adequate  hemostasis, the wound was irrigated with  saline and closed in layers  with Vicryl in a subcuticular fashion.  A sterile dressing was applied.  The patient was taken to the recovery room in satisfactory condition.      [name], M.D.  Electronically Signed     JDL/MEDQ  D:  12/03/2006  T:  12/03/2006  Job:  [id number]

## 2010-07-27 NOTE — Assessment & Plan Note (Signed)
[facility]                            [facility] OFFICE NOTE   NAME:Debra Hopkins, Debra Hopkins                         MRN:          [id number]  DATE:01/16/2006                            DOB:          October 16, 1940    REASON FOR VISIT:  Post-catheterization followup.   Debra Hopkins is a very pleasant 70 year old female, recently referred to Dr.  Andee Lineman, for evaluation of persistent exertional dyspnea and a recent  abnormal adenosine stress test. Despite the absence of any perfusion  defects, the stress test was notable for associated chest pain and dramatic  ST segment changes, suggestive of inferior lateral ischemia. The patient was  thus referred for a diagnostic cardiac catheterization.   However, a coronary angiography, performed by Dr. Charlies Constable, revealed non-  obstructive CAD with a 40% proximal LAD lesion and 60% mid/70% distal RCA  stenosis. Left ventricular function was normal.   Dr. Juanda Chance, commented that these lesions did not correspond with any  associated ischemia on the recent perfusion study.   Clinically, Debra Hopkins continues to have exertional dyspnea which is  significant when walking up an incline. Along the ground, however, she does  not have significant dyspnea. As noted earlier, she has Debra Hopkins exertional chest  discomfort. Her dyspnea seems to have worsened over these past few months.   MEDICATIONS:  Unchanged since October 5 office visit.   PHYSICAL EXAMINATION:  VITAL SIGNS: Blood pressure 120/58, pulse 50,  regular, weight 208.  GENERAL: 70 year old female sitting upright in Debra Hopkins apparent distress.  NECK: Palpable bilateral carotid pulses with bilateral carotid bruits.  LUNGS: Clear to auscultation.  HEART: Regular rate and rhythm (S1, S2) Debra Hopkins significant murmurs.  EXTREMITIES: Right groin stable with Debra Hopkins hematoma, ecchymosis, or bruit on  auscultation; a palpable right femoral and peripheral pulses.  NEUROLOGIC: Debra Hopkins focal deficit.   IMPRESSION:  1. Persistent exertional dyspnea.      a.     Non-obstructive coronary artery disease by recent       catheterization.      b.     Normal left ventricular function.  2. Peripheral vascular disease.      a.     60-79% left ICA stenosis.      b.     Status post PTA left lower extremity by Dr. Hart Rochester.  3. Type-2 diabetes mellitus.  4. Hypertension.  5. Dyslipidemia.   PLAN:  1. Following review of recent catheterization results with Dr. Andee Lineman, the      following additional workup is recommended: A D-Dimer to exclude      thromboembolic disease, 2D echocardiogram for assessment of left      ventricular function and rule out of underlying structural heart      disease, and the addition of low-dose Imdur in the event that the      exertional dyspnea is related to microvascular disease.  2. Return clinic followup with myself and Dr. Andee Lineman in one month for      review of test results and further recommendations.      Gene Serpe, PA-C  Electronically  Signed      Learta Codding, MD,FACC  Electronically Signed   GS/MedQ  DD: 01/16/2006  DT: 01/17/2006  Job #: [id number]   cc:   Weyman Pedro

## 2010-07-27 NOTE — Op Note (Signed)
[place]  Patient:    Debra Hopkins, Debra Hopkins Visit Number: [id number] MRN: [id number]          Service Type: DSU Location: [facility] 2899 15 Attending Physician:  Colvin Caroli Dictated by:   [name], M.D. Proc. Date: 06/11/01 Admit Date:  06/11/2001 Discharge Date: 06/11/2001                             Operative Report  PREOPERATIVE DIAGNOSIS:  Severe claudication left leg secondary to left iliac or superficial femoral occlusive disease.  PROCEDURE:  Abdominal aortogram with bilateral lower extremity runoff via left common femoral approach.  SURGEON:  Dr. Hart Rochester.  ANESTHESIA:  Local Xylocaine, versed 2 mg intravenously.  DESCRIPTION OF PROCEDURE:  The patient was taken to the [facility] Peripheral Endovascular lab and placed in the supine position at which time both groins were prepped with alcohol and draped in routine sterile manner.  The left common femoral artery was entered percutaneously after infiltrating with 1% Xylocaine.  A guidewire was passed into the suprarenal aorta and a 5 french sheath and dilator passed over the guidewire.  Dilator removed and standard pigtail catheter positioned in suprarenal aorta.  Flushing abdominal aortogram was performed injecting 20 cc. of contrast at 20 cc/sec.  This revealed the aorta to be normal in appearance with single renal arteries bilaterally which were widely patent.  The inferior mesenteric artery was small but patent and both internal, common, and external iliac arteries were widely patent.  The catheter was withdrawn into the terminal aorta and bilateral lower extremity runoff performed injecting 88 cc. of contrast at 8 cc/sec using a step procedure.  This revealed the iliac systems to be widely patent.  There was mild diffuse right superficial femoral occlusive disease with no significant stenosis and a patent popliteal artery with 3 vessel runoff on the right.  On the left  there was a severe superficial femoral stenosis about 8 cm. distal to its origin which caused about a 90% narrowing over about 1 cm.  There was another intraluminal filling defect about 3 cm distal to this and some mild to moderate plaque throughout.  The vessel, however, had no other areas of significant stenosis.  The popliteal artery was patent across the knee joint and there was 3 vessel runoff with a small anterior tibial artery.  These lesions were better identified using the peak hold technique both in the proximal thigh and at the popliteal level.  Having tolerated the procedure well the catheter was removed over the guidewire, the sheath removed, no complications ensured.  FINDINGS: 1. Widely patent aortoiliac system with single widely patent renal arteries    bilaterally. 2. Diffuse left superficial femoral occlusive disease with severe stenosis    proximal 3rd, 95%, over a short segment. 3. Normal runoff on the left with a small anterior tibial artery. 4. Mild diffuse right superficial femoral occlusive disease with no    significant stenosis and normal runoff. Dictated by:   [name], M.D. Attending Physician:  Colvin Caroli DD:  06/11/01 TD:  06/11/01 Job: [id number]

## 2010-07-27 NOTE — Assessment & Plan Note (Signed)
[facility]                          [facility] OFFICE NOTE   NAME:Debra Hopkins, Debra Hopkins                         MRN:          [id number]  DATE:02/21/2006                            DOB:          14-Aug-1940    HISTORY OF PRESENT ILLNESS:  The patient is a 70 year old female with a  history of nonobstructive coronary artery disease.  The patient  underwent recent catheterization.  She has been stable from a cardiac  perspective.  She reports no chest pain or shortness of breath.  She  does state that shortly after she takes all her blood pressure  medication she has a drop in blood pressure with a sensation of  dizziness and weakness.  Since her last office visit she had an  echocardiographic study, that which showed normal LV size and function  with no significant valvular lesions.  She also had a d-dimer  done  which was entirely within normal limits.   MEDICATIONS:  1. Prilosec over-the-counter 20 mg a day.  __________  1. Diovan 320 mg q.d.  2. Metformin.  3. Atenolol 50 mg a day.  4. Calcium.  5. Folic acid.  6. Omega-3.  7. Woman's Multivitamin.  8. Aspirin 325 mg a day.  9. Crestor 10 mg a day.  10.Isosorbide 50 mg p.o. q.d.   PHYSICAL EXAMINATION:  VITAL SIGNS:  Blood pressure 150/80, heart rate  60.  NECK:  Normal and soft, bilateral carotid bruits.  LUNGS:  Clear breath sounds bilaterally.  HEART:  Regular rate and rhythm.  ABDOMEN:  Soft.  EXTREMITIES:  No edema.   PROBLEMS:  1. Dyspnea      a.     Nonobstructive coronary artery disease by catheterization.      b.     Normal LV function.      c.     Deconditioning.  2. Peripheral vascular disease.      a.     67% left ICA stenosis.      b.     Status post PTA left lower extremity, Dr. Hart Rochester.  3. Type 2 diabetes mellitus  4. Hypertension.  5. Dyslipidemia.  6. Fluctuating blood pressure.   PLAN:  1. I have given a recommendation today for the patient to spread out  her antihypertensive medications, particularly the sooner she can      take at night and split Diovan up in the 20 half tablet twice a      day.  She can also stop the isosorbide in the interim.  2. I recommended to the patient to obtain a repeat carotid Doppler in      1 year.  The patient will follow up with Dr. Eliberto Ivory regarding her      medical problems.     Learta Codding, MD,FACC  Electronically Signed    GED/MedQ  DD: 02/21/2006  DT: 02/21/2006  Job #: [id number]

## 2010-07-27 NOTE — Cardiovascular Report (Signed)
NAMENYKERRIA, MACCONNELL                  ACCOUNT NO.:  [id number]   MEDICAL RECORD NO.:  [id number]          PATIENT TYPE:  OIB   LOCATION:  NA                           FACILITY:  [facility]   PHYSICIAN:  Bruce R. Juanda Chance, MD, FACCDATE OF BIRTH:  March 30, 1940   DATE OF PROCEDURE:  12/18/2005  DATE OF DISCHARGE:                              CARDIAC CATHETERIZATION   CLINICAL HISTORY:  Debra Hopkins  is a 70 year old and has diabetes, peripheral  vascular disease with previous PTA of the left lower extremity, hypertension  and hyperlipidemia.  She recently has had symptoms of dyspnea on exertion  and had an adenosine Cardiolite performed.  She had chest pain and marked  EKG changes, but no evidence of perfusion abnormality.  Because the  patient's high-risk profile, Dr. Andee Lineman made a decision to evaluate her with  angiography.   PROCEDURE:  The procedure was performed via the right femoral artery, with  an arterial sheath and 4 [place] preformed coronary catheters.  A femoral  arterial punch was performed and Omnipaque contrast was used.  She tolerated  procedure well and left the laboratory in satisfactory condition.   RESULTS:  Aortic pressure was 152/58 with mean of 93 and left ventricular  pressure is 152/11.   Left Main Coronary Artery:  The left main coronary artery is free of  significant disease.   Left Anterior Descending Artery:  The left anterior descending artery gave  rise to three diagonal branches and two septal perforators.  There was 40%  narrowing in the proximal LAD.   The Circumflex Artery:  The circumflex artery gave rise to a small ramus  branch, a large marginal branch and a small AV branch.  There were  irregularities in these vessels but no significant obstruction.   The Right Coronary Artery:  The right coronary artery is a moderate-sized  vessel that gave rise to a conus branch, two right ventricular branches, two  posterior descending branches and two posterolateral  branches.  There was  30%  proximal stenosis and 60% stenosis in the mid vessel.  There was  moderate calcification in the proximal mid vessel.  There was 70% stenosis  in the distal vessel after the posterior descending branch which was ostial  in location of posterior descending branch.   The Left Ventriculogram:  The left ventricular performed in the RAO  projection showed good wall motion with no areas of hypokinesis.  The  estimated ejection fraction was 60%.   A Distal Aortogram:  A distal aortogram was performed which showed patent  renal arteries and no significant aortic obstruction.   CONCLUSION:  Nonobstructive coronary artery disease with 40% narrowing in  the proximal LAD, minimal irregularities in the circumflex artery, 60%  narrowing in the mid-right coronary and 70% narrowing in the distal right  coronary with normal LV function.   RECOMMENDATIONS:  The patient has nonobstructive coronary disease.  I do not  think either of the lesions in the right coronary artery are enough to a be  flow-limiting and the scan did not suggest that.  I suspect her  dyspnea is  related to other causes.  Will arrange follow-up with Dr. Andee Lineman and Dr.  Eliberto Ivory, and they can decide regarding further evaluation.           ______________________________  Debra Beals Juanda Chance, MD, [facility]     BRB/MEDQ  D:  12/18/2005  T:  12/19/2005  Job:  [id number]   cc:   Learta Codding, MD,FACC  [facility] Pedro  Cardiopulmonary Laboratory

## 2010-07-27 NOTE — Op Note (Signed)
[place]. [facility]  Patient:    DESREE, LEAP Visit Number: [id number] MRN: [id number]          Service Type: DSU Location: [id number] Attending Physician:  Colvin Caroli Dictated by:   [name], M.D. Proc. 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  PROCEDURE:  Left superficial femoral artery PTA of two discrete lesions in the proximal third via antegrade puncture left common femoral artery.  SURGEON:  Quita Skye. Hart Rochester, M.D.  ANESTHESIA:  Local Xylocaine and Versed 2 mg intravenously.  COMPLICATIONS:  None.  DESCRIPTION OF PROCEDURE:  The patient was taken to the [facility] peripheral endovascular laboratory, placed in the supine position, at which time both groins were prepped with Betadine solution and draped in a routine sterile manner.  After infiltration with 1% Xylocaine the left common femoral artery was entered in an antegrade fashion percutaneously and a guidewire passed into the superficial femoral artery under fluoroscopic guidance.  A 5 French sheath and dilator were passed over the guidewire, the dilator and the guidewire removed.  Angiogram was then performed through the sheath which revealed two discrete lesions in the proximal third of the thigh.  One was 3 cm in length, the more proximal lesion, about 60% stenotic in severity, and the second was about 90-95% in severity and was over a 1-2 cm segment slightly more distally located.  A 0.014 wire was then used to cross the lesion and using a 4 x 2 Aviator PTA catheter the distal-most lesion (more severe) was then angioplastied after giving 3000 units of heparin intravenously.  The balloon was inflated at 10 atmospheres for 45 seconds and a post angioplasty angiogram revealed a good cosmetic result with  no evidence of dissection in the distal lesion.  The proximal lesion was then angioplastied using a 4 x 3 Powerflex catheter at 10 atmospheres for 45 seconds and a post angioplasty film revealed good size improvement of the vessel with a superficial dissection.  This was tacked down by inflating the balloon at 3 atmospheres for 90 seconds and a second study revealed this to be improved but some slight dissection distally which did not appear to be flow limiting.  Having tolerated the procedure well the guidewire was removed and after correction of the ACT the sheath was removed, adequate compression applied, and no complications ensued.  FINDINGS: 1. Two discrete left superficial femoral artery stenotic lesions, both in the    upper third in the thigh, the proximal one approximately 60% in severity    and the distal one approximately 90% in severity. 2. Successful percutaneous transluminal angioplasty of two superficial femoral    artery lesions in the left leg using a 4 x 2 Aviator balloon and a 4 x 3    Powerflex balloon. Dictated by:   [name], M.D. Attending Physician:  Colvin Caroli DD:  07/02/01 TD:  07/02/01 Job: [id number]

## 2010-07-27 NOTE — Assessment & Plan Note (Signed)
[facility]                            [facility] OFFICE NOTE   NAME:Debra Hopkins, Debra Hopkins                         MRN:          [id number]  DATE:12/13/2005                            DOB:          27-Sep-1940    REFERRED BY:  Dr. Weyman Pedro.   HISTORY OF PRESENT ILLNESS:  The patient is a 70 year old female with a  history of peripheral vascular disease including carotid artery disease and  lower extremity vascular disease. The patient was recently referred by Dr.  Eliberto Ivory for a stress Cardiolite study due to new onset symptoms of dyspnea  and dizziness on exertion. Baseline EKG demonstrates an abnormal sinus  rhythm. The patient received an adenosine infusion. She reported jaw and  neck pain during the drug infusion. There were dramatic ST segment changes  seen suggestive of ischemia in the inferior and lateral precordial leads.  The demographic images however did not show any focal perfusion defect. The  ejection fraction was 64%. However, given the patient's high pretest  probability of coronary artery disease and dramatic EKG changes, I felt in  reading the study that the patient could have balanced ischemia or small  vessel ischemic disease or  diabetes mellitus.   She denies substernal chest pain however on exertion, but she clearly has  significant dyspnea. The patient over the last several weeks has also  reported occasional palpitations. For a while, they were occurring on a  daily basis lasting minutes sometimes to several hours. In the course of the  last few weeks, Dr. Eliberto Ivory has made some changes and she will discontinue  the patient's Sular. No changes however were noted in the symptomatology and  more recently the patient received the addition of metoprolol. She stated  that her palpitations have somewhat subsided. There appears to be  significant weakness associated with palpitations but no shortness of  breath. It also appears that  at times her symptoms of palpitations and  shortness of breath are not related.   MEDICATIONS:  1. Prilosec OTC.  2. Demadex 20 mg a day.  3. Sular 10 mg a day.  4. Diovan 320 mg a day.  5. Metformin 5 mg a day.  6. Crestor 10 mg a day.  7. Propoxyphene.  8. Oxycodone.  9. Ambien.  10.Os-Cal.  11.Folic acid.  12.Omega 3.  13.Ecotrin.  14.Centrum Silver.  15.Gaviscon.  16.Metoprolol 25 mg p.o. b.i.d.   PAST MEDICAL HISTORY:  1. Status post disk disease with anterior cervical laminectomy.  2. History of palpitations.  3. History of peripheral vascular disease including coronary artery      disease with a 60-79% left ICA stenosis and 0-39% right ICA stenosis.  4. Lower extremity vascular disease status post PTA of the left lower      extremity by Dr. Hart Rochester. The patient is followed on an annual basis.  5. Diabetes mellitus.  6. Dyslipidemia.  7. Hypertension.   SOCIAL HISTORY:  The patient lives with her husband in [place]. She used to  smoke but quit approximately 20 years ago. She used to smoke  a pack a day.   FAMILY HISTORY:  Notable for father with coronary artery disease.   REVIEW OF SYSTEMS:  As per HPI. No nausea or vomiting, no fever or chills.  No melena or hematochezia, no orthopnea or PND. Positive for palpitations  but no syncope. Positive for dizziness and weakness. The remainder of the  review of systems is negative.   PHYSICAL EXAMINATION:  VITAL SIGNS:  Blood pressure is 124/70, heart rate 60  beats per minute.  HEENT:  Pupils __________ , conjunctiva clear.  NECK:  Supple, normal carotid upstroke. There is a left carotid bruit. JVD  is 6 cm. A well healed scar on the left side of the neck from anterior  cervical laminectomy.  LUNGS:  Clear breath sounds bilaterally.  HEART:  Regular rate and rhythm with normal S1, S2. No pathological murmurs.  ABDOMEN:  Soft and nontender with no rebound or guarding and good bowel  sounds.  EXTREMITIES:  No cyanosis,  clubbing or edema.  NEUROLOGIC:  The patient is alert, oriented and grossly nonfocal.  PERIPHERAL:  Reveals absent dorsalis pedis pulses in the left leg and a very  faint posterior tibial in the left leg. Dorsalis pedis and posterior tibial  in the right leg are palpable. Femoral pulses are palpable bilaterally.   LABORATORY DATA:  Laboratory work is pending.   PROBLEM LIST:  See above.  1. Dyspnea:  Rule out angina equivalent.      a.     Abnormal Cardiolite study with significant EKG changes during       adenosine fusion.      b.     Normal perfusion images.      c.     High pretest probability for coronary artery disease.  2. Peripheral vascular disease status post PTA of the left lower      extremity.  3. Diabetes mellitus.  4. Hypertension.  5. Dyslipidemia.   PLAN:  The patient's symptoms are somewhat atypical but she clearly has  dyspnea on exertion. In the setting of the marked abnormal EKG changes  during drug infusion, I suspect the patient has significant underlying  coronary artery disease. We will refer her for a cardiac catheterization.  Discussed the risks and benefits of the procedure and she is willing to  proceed in or J[facility] lad next week. Her husband has also had prior cardiac  catheterization and both are familiar with the procedure.  1. The patient can continue on her current medical regimen including beta      blocker, aspirin, calcium channel blocker and statin drug therapy.  2. The patient will followup with [place] in 1 month or earlier after her      cardiac catheterization.       Learta Codding, MD,FACC     GED/MedQ  DD:  12/13/2005  DT:  12/16/2005  Job #:  [id number]   cc:   Weyman Pedro

## 2010-07-27 NOTE — Op Note (Signed)
NAME:  Debra Hopkins, Debra Hopkins                            ACCOUNT NO.:  [id number]   MEDICAL RECORD NO.:  [id number]                   PATIENT TYPE:  AMB   LOCATION:  DAY                                  FACILITY:  [facility]   PHYSICIAN:  R. Roetta Sessions, M.D.              DATE OF BIRTH:  1940-04-29   DATE OF PROCEDURE:  01/03/2003  DATE OF DISCHARGE:                                 OPERATIVE REPORT   PROCEDURE:  Esophagogastroduodenoscopy with Elease Hashimoto dilation followed by  screening colonoscopy.   ENDOSCOPIST:  Jonathon Bellows, M.D.   INDICATION FOR PROCEDURE:  Patient is a 70 year old lady with a history of  Schatzki's ring previously dilated back in the 1990s at [facility],  who now has recurrent esophageal dysphagia.  She is sent over a screening  colonoscopy as well.  EGD and colonoscopy are now being done.  This approach  has been discussed with the patient at length and potential risks, benefits  and alternatives have been reviewed.  Please see my handwritten H&P for more  information.   PROCEDURAL NOTE:  O2 saturation, blood pressure, pulse and respirations were  monitored throughout the entirety of the procedure.   CONSCIOUS SEDATION:  Versed 6 mg IV, Demerol 125 mg IV in divided doses.   INSTRUMENT:  Olympus video chip pediatric colonoscope and Olympus adult  gastroscope.   FINDINGS:  EGD examination of the tubular esophagus revealed noncritical-  appearing Schatzki's ring; esophageal mucosa otherwise appeared normal.  The  EG junction was easily traversed.   STOMACH:  The gastric cavity was empty.  It insufflated well with air.  Thorough examination of the gastric mucosa including a retroflexed view of  the proximal stomach and esophagogastric junction demonstrated no  abnormalities, pylorus patent and easily traversed.   DUODENUM:  Bulb and second portion appeared normal.   THERAPEUTIC/DIAGNOSTIC MANEUVERS PERFORMED:  A 56-French Maloney dilator was  passed to full  insertion with ease.  Subsequently, a 58-French Maloney  dilator was passed to full insertion with ease.  A look back revealed no  apparent complication related to passage of the dilator.  The patient  tolerated the procedure well.   COLONOSCOPY:  Digital rectal examination revealed no abnormalities.   ENDOSCOPIC FINDINGS:  Prep was adequate.   RECTUM:  Examination of the rectal mucosa including a retroflexed view of  the anal verge revealed anal papillae and internal hemorrhoids only.   COLON:  The colonic mucosa was surveyed from the rectosigmoid junction  through the left, transverse, and right colon to the area of the appendiceal  orifice, ileocecal valve and cecum.  These structures were well-seen and  photographed for the record.  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  The remainder of the colonic mucosa appeared normal.  From  the level of the cecum and ileocecal valve, the scope was slowly withdrawn.  All previously mentioned mucosal  surfaces were again seen and no other  abnormalities were observed.  The patient tolerated the procedure well and  was reactive at endoscopy.   IMPRESSION:   ESOPHAGOGASTRODUODENOSCOPY:  1. Noncritical-appearing Schatzki's ring, otherwise, normal esophagus,     status post dilation, as described above.  2. Normal stomach, normal first and second duodenum.   COLONOSCOPIC FINDINGS:  1. Anal papillae and internal hemorrhoids, otherwise, normal rectum.  2. Sigmoid diverticulum.  3. Remainder of colonic mucosa appeared normal.   RECOMMENDATIONS:  1. Diverticulosis literature provided to Debra Hopkins.  2. Continue Prilosec 20 mg orally daily.  3. Repeat colonoscopy in 10 years.  4. She is to follow up with Dr. Eliberto Ivory as needed.      ___________________________________________                                            Jonathon Bellows, M.D.   RMR/MEDQ  D:  01/03/2003  T:  01/03/2003  Job:  [id number] [id number]   cc:   [facility] [place], [place] Carylon Perches.D.

## 2010-08-21 ENCOUNTER — Encounter: Payer: Self-pay | Admitting: Cardiology

## 2010-09-05 ENCOUNTER — Ambulatory Visit: Payer: Self-pay | Admitting: Cardiology

## 2010-09-13 ENCOUNTER — Encounter: Payer: Self-pay | Admitting: Cardiology

## 2010-09-17 ENCOUNTER — Encounter: Payer: Self-pay | Admitting: Adult Health

## 2010-09-17 ENCOUNTER — Ambulatory Visit (INDEPENDENT_AMBULATORY_CARE_PROVIDER_SITE_OTHER): Payer: Medicare Other | Admitting: Adult Health

## 2010-09-17 ENCOUNTER — Encounter: Payer: Self-pay | Admitting: *Deleted

## 2010-09-17 VITALS — BP 128/63 | HR 62 | Ht 63.0 in | Wt 181.0 lb

## 2010-09-17 DIAGNOSIS — I739 Peripheral vascular disease, unspecified: Secondary | ICD-10-CM

## 2010-09-17 DIAGNOSIS — R0602 Shortness of breath: Secondary | ICD-10-CM

## 2010-09-17 DIAGNOSIS — I2510 Atherosclerotic heart disease of native coronary artery without angina pectoris: Secondary | ICD-10-CM

## 2010-09-17 DIAGNOSIS — E785 Hyperlipidemia, unspecified: Secondary | ICD-10-CM

## 2010-09-17 DIAGNOSIS — I10 Essential (primary) hypertension: Secondary | ICD-10-CM

## 2010-09-17 NOTE — Assessment & Plan Note (Signed)
Followed by Dr. Sherryll Burger.  Continue low cholesterol diet.

## 2010-09-17 NOTE — Assessment & Plan Note (Signed)
Well controlled at present. No changes at this time. 

## 2010-09-17 NOTE — Assessment & Plan Note (Signed)
Debra Hopkins is doing fair from our standpoint. Continues fatigue and intermittent claudication symptoms in the left leg.  EKG shows some changes in the anterior leads. Will plan to repeat stress test (lexiscan) to evaluate from progression of CAD with known history of same. She is willing to proceed. This will be done in [facility] with results to be sent to Dr. Sherryll Burger in [place].

## 2010-09-17 NOTE — Progress Notes (Signed)
HPI: The patient is a very pleasant 70 year old female patient of Dr. Andee Lineman with a history of single-vessel coronary artery disease with without recurrent chest pain. Her last cardiac catheterization was in 2007. Chest is of chronic dependent edema secondary to venous insufficiency. She also states her hypertension. She has chronic knee problems and is limited in her mobility. She has a historyof  vascular disease and is status post left carotid endarterectomy as well as angioplasty of the left lower extremity. She has type 2 diabetes mellitus hypertension , dyslipidemia, and fibromyalgia with chronic fatigue.     She was admitted to [facility] 02/15/2010 for TKR and arthroplasty in the setting of severe osteoarthritis of the right knee.  She was seen by Dr. Johney Frame on consult at that time secondary to hypertension post-operatively. It was felt that this was related to post-operative pain.  She was placed on hydralazine temporarily and BP stabalized.   Since last visit with Dr. Andee Lineman one year ago, she has done well. She continues to have complaints of intermittent claudication symptoms in the left leg which limits her exercise capacity. She is followed by Dr. Hart Rochester for this and sees him a couple of times a year.  Her last stress test was in 2008 showing no ischemia. Last echo in 2010 at [facility] in [place]. I do not have those records to review on this visit.  Allergies  Allergen Reactions  . Erythromycin     REACTION: Upset stomach  . Ibuprofen     REACTION: nausea  . YNW:GNFAOZHYQMV+HQIONGEXB+MWUXLKGMWN Acid+Aspartame     REACTION: hives  . Naproxen     REACTION: severe nausea  . Prednisone     REACTION: itchy rash  . Sulfamethoxazole W/Trimethoprim     REACTION: Upset stomach    Current Outpatient Prescriptions  Medication Sig Dispense Refill  . Alum Hydroxide-Mag Carbonate (GAVISCON EXTRA RELIEF FORMULA) 160-105 MG CHEW Chew by mouth as needed.        Marland Kitchen aspirin (ECOTRIN)  325 MG EC tablet Take 325 mg by mouth daily.        . Calcium Carbonate-Vitamin D (CALTRATE 600+D) 600-400 MG-UNIT per tablet Take 1 tablet by mouth 4 (four) times daily.        . cholecalciferol (VITAMIN D) 1000 UNITS tablet Take 1,000 Units by mouth daily. Take 2 tabs       . cloNIDine (CATAPRES) 0.1 MG tablet Take 0.1 mg by mouth 2 (two) times daily.       . folic acid (FOLVITE) 800 MCG tablet Take 400 mcg by mouth daily.        Marland Kitchen glipiZIDE (GLUCOTROL) 2.5 MG 24 hr tablet Take 2.5 mg by mouth daily.        Marland Kitchen losartan (COZAAR) 100 MG tablet Take 100 mg by mouth daily.        . magnesium oxide (MAG-OX 400) 400 MG tablet Take 400 mg by mouth daily.        . Multiple Vitamins-Minerals (MULTIVITAL) tablet Take 1 tablet by mouth daily.        Marland Kitchen NIFEdipine (PROCARDIA-XL/ADALAT CC) 30 MG 24 hr tablet Take 30 mg by mouth daily.        . Omega-3 Fatty Acids (FISH OIL) 1000 MG CAPS Take by mouth daily.        Marland Kitchen omeprazole (PRILOSEC OTC) 20 MG tablet Take 20 mg by mouth daily.        Marland Kitchen oxyCODONE-acetaminophen (PERCOCET) 5-325 MG per tablet Take 1 tablet  by mouth as needed.        . simvastatin (ZOCOR) 40 MG tablet Take 40 mg by mouth at bedtime.        . torsemide (DEMADEX) 20 MG tablet Take 20 mg by mouth daily. Take 1-2 tabs       . traZODone (DESYREL) 50 MG tablet Take 50 mg by mouth daily as needed.        Marland Kitchen DISCONTD: alendronate (FOSAMAX) 70 MG tablet Take 70 mg by mouth every 7 (seven) days. Take with a full glass of water on an empty stomach.       . DISCONTD: Aliskiren-Valsartan (VALTURNA) 300-320 MG TABS Take by mouth daily as needed.        Marland Kitchen DISCONTD: cyclobenzaprine (FLEXERIL) 10 MG tablet Take 10 mg by mouth at bedtime.        Marland Kitchen DISCONTD: metoprolol succinate (TOPROL-XL) 25 MG 24 hr tablet Take 25 mg by mouth daily.        Marland Kitchen DISCONTD: rosuvastatin (CRESTOR) 10 MG tablet Take 10 mg by mouth 3 (three) times a week.        Marland Kitchen DISCONTD: sitaGLIPtin (JANUVIA) 100 MG tablet Take 100 mg by mouth  daily.          Past Medical History  Diagnosis Date  . Unspecified essential hypertension   . Other and unspecified hyperlipidemia   . PVD (peripheral vascular disease)   . Dyspnea   . DM2 (diabetes mellitus, type 2)     Past Surgical History  Procedure Date  . Breast biopsy   . Kidney stone surgery   . Aspiration of a cyst in the right breast   . Total abdominal hysterectomy   . Appendectomy   . Back surgery     EAV:WUJWJX of systems complete and found to be negative unless listed above PHYSICAL EXAM BP 128/63  Pulse 62  Ht 5\' 3"  (1.6 m)  Wt 181 lb (82.101 kg)  BMI 32.06 kg/m2  SpO2 96% General: Well developed, well nourished, in no acute distress Head: Eyes PERRLA, No xanthomas.   Normal cephalic and atramatic  Lungs: Clear bilaterally to auscultation and percussion. Heart: HRRR S1 S2 2/6 systolic murmur at the LSB..  Pulses are 2+ & equal.            No carotid bruit. No JVD.  No abdominal bruits. No femoral bruits. Abdomen: Bowel sounds are positive, abdomen soft and non-tender without masses or                  Hernia's noted. Msk:  Back normal, normal gait. Normal strength and tone for age. Extremities: No clubbing, cyanosis or edema.  DP +1 Neuro: Alert and oriented X 3. Psych:  Good affect, responds appropriately EKG: Sinus bradycardia rate of 59 bpm.  Evidence of anterior t-wave abnormalities, inversion.  ASSESSMENT AND PLAN

## 2010-09-17 NOTE — Assessment & Plan Note (Signed)
She is to continue to follow with Dr. Hart Rochester. Pulses are palpable in the Left leg.

## 2010-09-17 NOTE — Patient Instructions (Addendum)
Your physician recommends that you schedule a follow-up appointment ZO:XWRUE tests Your physician has requested that you have an echocardiogram. Echocardiography is a painless test that uses sound waves to create images of your heart. It provides your doctor with information about the size and shape of your heart and how well your heart's chambers and valves are working. This procedure takes approximately one hour. There are no restrictions for this procedure.  Your physician has requested that you have a lexiscan myoview. For further information please visit [email or web address] Please follow instruction sheet, as given.

## 2010-09-17 NOTE — Assessment & Plan Note (Signed)
Will reevaluate with ECHO to assess aortic valve and LV fx. She will follow-up to discuss the results.

## 2010-09-18 ENCOUNTER — Encounter: Payer: Self-pay | Admitting: *Deleted

## 2010-09-26 ENCOUNTER — Other Ambulatory Visit: Payer: Self-pay | Admitting: Cardiology

## 2010-09-26 DIAGNOSIS — I2510 Atherosclerotic heart disease of native coronary artery without angina pectoris: Secondary | ICD-10-CM

## 2010-10-03 ENCOUNTER — Ambulatory Visit (INDEPENDENT_AMBULATORY_CARE_PROVIDER_SITE_OTHER): Payer: Medicare Other | Admitting: *Deleted

## 2010-10-03 ENCOUNTER — Encounter (HOSPITAL_COMMUNITY)
Admission: RE | Admit: 2010-10-03 | Discharge: 2010-10-03 | Disposition: A | Discharge status: Home / Self Care | Payer: Medicare Other | Source: Ambulatory Visit | Attending: Cardiology | Admitting: Cardiology

## 2010-10-03 ENCOUNTER — Ambulatory Visit (HOSPITAL_COMMUNITY)
Admission: RE | Admit: 2010-10-03 | Discharge: 2010-10-03 | Disposition: A | Discharge status: Home / Self Care | Payer: Medicare Other | Source: Ambulatory Visit | Attending: Adult Health | Admitting: Adult Health

## 2010-10-03 ENCOUNTER — Encounter (HOSPITAL_COMMUNITY): Payer: Self-pay

## 2010-10-03 DIAGNOSIS — I10 Essential (primary) hypertension: Secondary | ICD-10-CM | POA: Insufficient documentation

## 2010-10-03 DIAGNOSIS — E785 Hyperlipidemia, unspecified: Secondary | ICD-10-CM

## 2010-10-03 DIAGNOSIS — E119 Type 2 diabetes mellitus without complications: Secondary | ICD-10-CM | POA: Insufficient documentation

## 2010-10-03 DIAGNOSIS — I2510 Atherosclerotic heart disease of native coronary artery without angina pectoris: Secondary | ICD-10-CM | POA: Insufficient documentation

## 2010-10-03 DIAGNOSIS — I739 Peripheral vascular disease, unspecified: Secondary | ICD-10-CM

## 2010-10-03 DIAGNOSIS — R0602 Shortness of breath: Secondary | ICD-10-CM

## 2010-10-03 MED ORDER — TECHNETIUM TC 99M TETROFOSMIN IV KIT
10.0000 | PACK | Freq: Once | INTRAVENOUS | Status: AC | PRN
  Administered 2010-10-03: 10.44 via INTRAVENOUS

## 2010-10-03 MED ORDER — TECHNETIUM TC 99M TETROFOSMIN IV KIT
30.0000 | PACK | Freq: Once | INTRAVENOUS | Status: AC | PRN
  Administered 2010-10-03: 30 via INTRAVENOUS

## 2010-10-03 NOTE — Progress Notes (Signed)
*  PRELIMINARY RESULTS* Echocardiogram 2D Echocardiogram has been performed.  Conrad Dover 10/03/2010, 12:43 PM

## 2010-10-03 NOTE — Progress Notes (Signed)
Stress Lab Nurses Notes - [facility]  ANNALICIA RENFREW 10/03/2010  Reason for doing test: CAD  Type of test: Steffanie Dunn  Nurse performing test: Parke Poisson, RN  Nuclear Medicine Tech: Lyndel Pleasure  Echo Tech: Not Applicable  MD performing test: R. Dietrich Pates  Family MD: Sherryll Burger  Test explained and consent signed: yes  IV started: 22g jelco, Saline lock flushed, No redness or edema and Saline lock started in radiology  Symptoms: SOB  Treatment/Intervention: None  Reason test stopped: protocol completed  After recovery IV was: Discontinued via X-ray tech  Patient to return to Nuc. Med at :  Patient discharged: Home  Patient's Condition upon discharge was: stable  Comments: symptoms resolved in recovery.  Erskine Speed T

## 2010-10-03 NOTE — Progress Notes (Deleted)
Exercise Treadmill Test  Pre-Exercise Testing Evaluation Rhythm: {CHL RHYTHM BASELINE EKG FOR ETT:21021046}  Rate: {CHL RATE BASELINE EKG FOR ETT:21021048}   PR:  {CHL PR BASELINE EKG FOR ETT:21021049} QRS:  {CHL QRS BASELINE EKG FOR ETT:21021050}  QT:  {CHL QT BASELINE EKG FOR ETT:21021051} QTc: {CHL QTC BASELINE EKG FOR ETT:21021052}   P axis: {CHL AXIS BASELINE EKG FOR ETT:21021053}  QRS axis:  {CHL AXIS BASELINE EKG FOR ETT:21021053}  ST Segments:  {CHL ST SEGMENTS BASELINE EKG FOR ETT:21021054}     Test  Exercise Tolerance Test Ordering MD: {CHL LB CARDIOLOGY MD FOR ETT:21021055}  Interpreting MD:  {CHL LB CARDIOLOGY MD FOR ETT:21021055}  Unique Test No: ***  Treadmill:  {CHL TREADMILL # FOR ETT:21021057}  Indication for ETT: {CHL INDICATION FOR ETT:21021058}  Contraindication to ETT: {CHL CONTRAINDICATION TO ETT:21021059}   Stress Modality: {CHL STRESS MODALITY FOR ETT:21021060}  Cardiac Imaging Performed: {CHL CARDIAC IMAGING PERFORMED FOR ETT:21021063}   Protocol: {CHL PROTOCOL FOR ETT:21021061}  Max BP:  ***/***  Max MPHR (bpm):  *** 85% MPR (bpm):  ***  MPHR obtained (bpm):  *** % MPHR obtained:  ***  Reached 85% MPHR (min:sec):  *** Total Exercise Time (min-sec):  ***  Workload in METS:  *** Borg Scale: ***  Reason ETT Terminated:  {CHL REASON TERMINATED FOR ETT:21021064}    ST Segment Analysis At Rest: {CHL ST SEGMENT AT REST FOR ETT:21021065} With Exercise: {CHL ST SEGMENT WITH EXERCISE FOR ETT:21021066}  Other Information Arrhythmia:  {CHL ARRHYTHMIA FOR ETT:21021070} Angina during ETT:  {CHL ANGINA DURING ETT:21021071} Quality of ETT:  {CHL QUALITY OF ETT:21021072}  ETT Interpretation:  {CHL INTERPRETATION FOR ETT:21021073}  Comments: ***  Recommendations: ***   

## 2010-10-05 ENCOUNTER — Encounter: Payer: Self-pay | Admitting: Adult Health

## 2010-10-05 ENCOUNTER — Ambulatory Visit (INDEPENDENT_AMBULATORY_CARE_PROVIDER_SITE_OTHER): Payer: Medicare Other | Admitting: Adult Health

## 2010-10-05 DIAGNOSIS — I10 Essential (primary) hypertension: Secondary | ICD-10-CM

## 2010-10-05 DIAGNOSIS — I2510 Atherosclerotic heart disease of native coronary artery without angina pectoris: Secondary | ICD-10-CM

## 2010-10-05 NOTE — Assessment & Plan Note (Signed)
Stress test completed 2 days ago and read in the office today by Dr. Dietrich Pates demonstrates normal perfusion.  No evidence of stress induced ischemia. Echocardiogram was also normal with EF of 60%-65%.  No WMA. She is given reassurance of her cardiac status. Questions are answered.  She will follow-up with Dr. Andee Lineman in one year unless symptomatic.

## 2010-10-05 NOTE — Assessment & Plan Note (Signed)
Currently well controlled no medication changes.

## 2010-10-05 NOTE — Progress Notes (Signed)
HPI: The patient is a very pleasant 70 year old female patient of Dr. Andee Lineman with a history of single-vessel coronary artery disease with without recurrent chest pain. Her last cardiac catheterization was in 2007. Chest is of chronic dependent edema secondary to venous insufficiency. She also states her hypertension. She has chronic knee problems and is limited in her mobility. She has a historyof  vascular disease and is status post left carotid endarterectomy as well as angioplasty of the left lower extremity. She has type 2 diabetes mellitus hypertension , dyslipidemia, and fibromyalgia with chronic fatigue.     She was admitted to [facility] 02/15/2010 for TKR and arthroplasty in the setting of severe osteoarthritis of the right knee.  She was seen by Dr. Johney Frame on consult at that time secondary to hypertension post-operatively. It was felt that this was related to post-operative pain.  She was placed on hydralazine temporarily and BP stabalized.   Since last visit with Dr. Andee Lineman one year ago, she has done well. She continues to have complaints of intermittent claudication symptoms in the left leg which limits her exercise capacity. She is followed by Dr. Hart Rochester for this and sees him a couple of times a year.  Her last stress test was in 2008 showing no ischemia. Last echo in 2010 at [facility] in [place].     On last visit a stress test and echocardiogram were ordered. She is here on follow-up to discuss the results. She is asymptomatic at this time.  Allergies  Allergen Reactions  . Erythromycin     REACTION: Upset stomach  . Ibuprofen     REACTION: nausea  . EAV:[id number]+GNFAOZHYQ+MVHQIONGEX Acid+Aspartame     REACTION: hives  . Naproxen     REACTION: severe nausea  . Prednisone     REACTION: itchy rash  . Sulfamethoxazole W/Trimethoprim     REACTION: Upset stomach    Current Outpatient Prescriptions  Medication Sig Dispense Refill  . Alum Hydroxide-Mag Carbonate  (GAVISCON EXTRA RELIEF FORMULA) 160-105 MG CHEW Chew by mouth as needed.        Marland Kitchen aspirin (ECOTRIN) 325 MG EC tablet Take 325 mg by mouth daily.        Marland Kitchen aspirin 81 MG tablet Take 81 mg by mouth as directed. Every other day       . Calcium Carbonate-Vitamin D (CALTRATE 600+D) 600-400 MG-UNIT per tablet Take 1 tablet by mouth 4 (four) times daily.        . cholecalciferol (VITAMIN D) 1000 UNITS tablet Take 1,000 Units by mouth daily. Take 2 tabs       . cloNIDine (CATAPRES) 0.1 MG tablet Take 0.1 mg by mouth 2 (two) times daily.       . folic acid (FOLVITE) 800 MCG tablet Take 400 mcg by mouth daily.        Marland Kitchen glipiZIDE (GLUCOTROL) 2.5 MG 24 hr tablet Take 2.5 mg by mouth daily.        . Glucosamine-Chondroitin (OSTEO BI-FLEX REGULAR STRENGTH PO) Take 1 tablet by mouth daily.        Marland Kitchen losartan (COZAAR) 100 MG tablet Take 100 mg by mouth daily.        . magnesium oxide (MAG-OX 400) 400 MG tablet Take 400 mg by mouth daily.        . Multiple Vitamins-Minerals (MULTIVITAL) tablet Take 1 tablet by mouth daily.        Marland Kitchen NIFEdipine (PROCARDIA-XL/ADALAT CC) 30 MG 24 hr tablet Take 30 mg by  mouth daily.        . Omega-3 Fatty Acids (FISH OIL) 1000 MG CAPS Take by mouth daily.        Marland Kitchen omeprazole (PRILOSEC OTC) 20 MG tablet Take 20 mg by mouth daily.        Marland Kitchen oxyCODONE-acetaminophen (PERCOCET) 5-325 MG per tablet Take 1 tablet by mouth as needed.        . simvastatin (ZOCOR) 40 MG tablet Take 40 mg by mouth at bedtime.        . torsemide (DEMADEX) 20 MG tablet Take 20 mg by mouth daily. Take 1-2 tabs       . traZODone (DESYREL) 50 MG tablet Take 50 mg by mouth daily as needed.          Past Medical History  Diagnosis Date  . Unspecified essential hypertension   . Other and unspecified hyperlipidemia   . PVD (peripheral vascular disease)   . Dyspnea   . DM2 (diabetes mellitus, type 2)   . Diabetes mellitus     Past Surgical History  Procedure Date  . Breast biopsy   . Kidney stone surgery   .  Aspiration of a cyst in the right breast   . Total abdominal hysterectomy   . Appendectomy   . Back surgery     ZOX:WRUEAV of systems complete and found to be negative unless listed above PHYSICAL EXAM BP 118/61  Pulse 51  Ht 5\' 3"  (1.6 m)  Wt 174 lb (78.926 kg)  BMI 30.82 kg/m2  SpO2 97% General: Well developed, well nourished, in no acute distress Lungs: Clear bilaterally to auscultation and percussion. Heart: HRRR S1 S2 2/6 systolic murmur at the LSB..  Pulses are 2+ & equal.            No carotid bruit. No JVD.  No abdominal bruits. No femoral bruits. Extremities: No clubbing, cyanosis or edema.  DP +1  ASSESSMENT AND PLAN

## 2010-10-10 ENCOUNTER — Encounter: Payer: Self-pay | Admitting: *Deleted

## 2010-11-15 ENCOUNTER — Other Ambulatory Visit: Payer: Self-pay | Admitting: *Deleted

## 2010-11-15 MED ORDER — NIFEDIPINE ER 30 MG PO TB24: 30.0000 mg | ORAL_TABLET | Freq: Every day | ORAL | Status: DC

## 2010-12-18 ENCOUNTER — Ambulatory Visit (INDEPENDENT_AMBULATORY_CARE_PROVIDER_SITE_OTHER): Payer: Medicare Other | Admitting: Internal Medicine

## 2010-12-18 ENCOUNTER — Encounter (INDEPENDENT_AMBULATORY_CARE_PROVIDER_SITE_OTHER): Payer: Self-pay | Admitting: Internal Medicine

## 2010-12-18 VITALS — BP 126/68 | HR 72 | Temp 97.4°F | Resp 18 | Ht 63.0 in | Wt 179.0 lb

## 2010-12-18 DIAGNOSIS — K5900 Constipation, unspecified: Secondary | ICD-10-CM

## 2010-12-18 DIAGNOSIS — K219 Gastro-esophageal reflux disease without esophagitis: Secondary | ICD-10-CM

## 2010-12-18 DIAGNOSIS — K5909 Other constipation: Secondary | ICD-10-CM

## 2010-12-18 NOTE — Patient Instructions (Addendum)
Continue anti-reflex measures. Start pantoprazole 40 mg by mouth 30 minutes for breakfast daily. Can take Citrucel one tablespoonful every day Call if pantoprazole does not work but wait  at least 2 weeks.

## 2010-12-18 NOTE — Consult Note (Signed)
Referring Provider: Shah, Ashish, MD Primary Care Physician:  SHAH,ASHISH, MD Primary Gastroenterologist:  Dr. Braidyn Peace    Chief Complaint   Patient presents with   .  Gastrophageal Reflux        HPI:  Patient is is a 70 y.o. female A. show Dr. Shah's who is here for scheduled visit to discuss management of her chronic GERD.  She has several year history of GERD. She has been on Prilosec since she has developed osteoporosis she decided to discontinue Prilosec. She took it every other day for 2 months and then stopped it. However she began to have heartburn and burning in her throat as well as regurgitation. She went back on Prilosec and it is not working. She saw Dr. Shah will give her prescription for pantoprazole and after reading the side effects she she is not sure if she wanted to take this medication. He continues to experience heartburn and regurgitation and it is having an impact on quality of her life. She denies dysphagia or a done aphasia nausea or vomiting. He complains of constipation. She says she has lost 35 pounds over the last year or so which she believes is secondary to metformin. She complains of occasional right lower quadrant abdominal pain and once in a while she may have a pain on the left side. This pain is mild and resolves spontaneously. Her colonoscopy by me last year in May 2011 and was found to have mild sigmoid diverticulosis and has quit eating nuts and she believes she is having less pain.   Current medications Current Outpatient Prescriptions   Medication  Sig  Dispense  Refill   .  Alum Hydroxide-Mag Carbonate (GAVISCON EXTRA RELIEF FORMULA) 160-105 MG CHEW  Chew by mouth as needed.           .  Alum Hydroxide-Mag Carbonate (GAVISCON PO)  Take by mouth. As needed          .  aspirin (ECOTRIN) 325 MG EC tablet  Take 325 mg by mouth daily. Patient states that she alternates taking the 325 mg and the 81 mg... She takes them every other day         .  aspirin 81 MG  tablet  Take 81 mg by mouth as directed. Every other day          .  beta carotene w/minerals (OCUVITE) tablet  Take 1 tablet by mouth daily.           .  Biotin 1000 MCG tablet  Take 1,000 mcg by mouth daily.           .  Calcium Carbonate-Vitamin D (CALTRATE 600+D) 600-400 MG-UNIT per tablet  Take 1 tablet by mouth 3 (three) times daily.          .  cholecalciferol (VITAMIN D) 1000 UNITS tablet  Take 2,000 Units by mouth daily.          .  cloNIDine (CATAPRES) 0.1 MG tablet  Take 0.1 mg by mouth 2 (two) times daily.          .  folic acid (FOLVITE) 800 MCG tablet  Take 400 mcg by mouth daily.           .  Glucosamine-Chondroitin (OSTEO BI-FLEX REGULAR STRENGTH PO)  Take 1 tablet by mouth daily.           .  losartan (COZAAR) 100 MG tablet  Take 100 mg by mouth daily.           .    metFORMIN (GLUCOPHAGE) 500 MG tablet  Take 500 mg by mouth 2 (two) times daily with a meal.           .  methylcellulose (CITRUCEL) oral powder  Take by mouth. 1 TBSP Twice Weekly          .  Multiple Vitamins-Minerals (MULTIVITAL) tablet  Take 1 tablet by mouth daily.           .  NIFEdipine (PROCARDIA-XL/ADALAT CC) 30 MG 24 hr tablet  Take 1 tablet (30 mg total) by mouth daily.   30 tablet   11   .  Omega-3 Fatty Acids (FISH OIL) 1000 MG CAPS  Take by mouth daily.           .  oxyCODONE-acetaminophen (PERCOCET) 5-325 MG per tablet  Take 1 tablet by mouth as needed.           .  torsemide (DEMADEX) 20 MG tablet  Take 20 mg by mouth daily. Take 1-2 tabs          .  traZODone (DESYREL) 50 MG tablet  Take 50 mg by mouth daily as needed.             past medical history She has chronic GERD and history of Schatzki's ring which was dilated twice by Dr. Rourk initially in 90.s  and more recently in October 2004. She is screening colonoscopy in October 2004 another one in May 2011 because of heme positive stools. She has mild sigmoid diverticulosis. She has osteoporosis. 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 She had angioplasty to her artery in left leg in April 2003 and left carotid endarterectomy in September 2008 Appendectomy in 1963 Hysterectomy in 1970 Neck surgery in February in 1997 for disc disease and she had a bone graft from her left hip She had right knee replacement in November 2011 and had arthroscopy back in September 2009      Allergies as of 12/18/2010 - Review Complete 12/18/2010   Allergen  Reaction  Noted   .  Augmentin  Hives  12/18/2010   .  Ciprofloxacin  Nausea Only  12/18/2010   .  Erythromycin       .  Ibuprofen       .  Kdc:amoxicillin+saccharin+clavulanic acid+aspartame       .  Naproxen       .  Prednisone       .  Sulfamethoxazole w/trimethoprim                Physical Exam: BP 126/68  Pulse 72  Temp(Src) 97.4 F (36.3 C) (Oral)  Resp 18  Ht 5' 3" (1.6 m)  Wt 179 lb (81.194 kg)  BMI 31.71 kg/m2 Conjunctiva is pink. Sclerae nonicteric.   Oropharyngeal mucosa is normal   Neck masses or thyromegaly noted.   Cardiac exam with regular rhythm normal S1 and S2. No murmur or gallop noted.   Abdomen is full. Bowel sounds are normal. On palpation soft abdomen without tenderness organomegaly or masses. Rectal examination deferred.   No peripheral edema or clubbing noted.   Assessment   #1. Chronic GERD.patient is having daily symptoms. Prilosec worked for years but not anymore. PPI therapy may or may not be contradicting to her osteoporosis I am afraid her GERD symptoms cannot be noted since she

## 2010-12-20 LAB — URINALYSIS, ROUTINE W REFLEX MICROSCOPIC
Bilirubin Urine: NEGATIVE
Ketones, ur: NEGATIVE
Nitrite: NEGATIVE
Specific Gravity, Urine: 1.016
Urobilinogen, UA: 0.2

## 2010-12-20 LAB — COMPREHENSIVE METABOLIC PANEL
AST: 23
Albumin: 4.3
BUN: 19
Calcium: 10
Chloride: 103
Creatinine, Ser: 1.09
GFR calc non Af Amer: 50 — ABNORMAL LOW
Total Bilirubin: 0.3

## 2010-12-20 LAB — TYPE AND SCREEN

## 2010-12-20 LAB — BASIC METABOLIC PANEL
CO2: 26
Chloride: 108
GFR calc Af Amer: >60
Potassium: 3.9
Sodium: 137

## 2010-12-20 LAB — CBC
HCT: 29.3 — ABNORMAL LOW
Hemoglobin: 12.4
Hemoglobin: 9.8 — ABNORMAL LOW
MCV: 92
RBC: 3.19 — ABNORMAL LOW
RBC: 4.02
WBC: 7

## 2010-12-20 LAB — PROTIME-INR: INR: 1.1

## 2010-12-20 LAB — ABO/RH: ABO/RH(D): A POS

## 2010-12-20 LAB — APTT: aPTT: 28

## 2011-01-14 ENCOUNTER — Encounter (INDEPENDENT_AMBULATORY_CARE_PROVIDER_SITE_OTHER): Payer: Self-pay | Admitting: Internal Medicine

## 2011-01-14 NOTE — Progress Notes (Signed)
Referring Provider: Kirstie Peri, MD Primary Care Physician:  Kirstie Peri, MD Primary Gastroenterologist:  Dr. Karilyn Cota    Chief Complaint   Patient presents with   .  Gastrophageal Reflux        HPI:  Patient is is a 70 y.o. female A. show Dr. Margaretmary Eddy who is here for scheduled visit to discuss management of her chronic GERD.  She has several year history of GERD. She has been on Prilosec since she has developed osteoporosis she decided to discontinue Prilosec. She took it every other day for 2 months and then stopped it. However she began to have heartburn and burning in her throat as well as regurgitation. She went back on Prilosec and it is not working. She saw Dr. Sherryll Burger will give her prescription for pantoprazole and after reading the side effects she she is not sure if she wanted to take this medication. He continues to experience heartburn and regurgitation and it is having an impact on quality of her life. She denies dysphagia or a done aphasia nausea or vomiting. He complains of constipation. She says she has lost 35 pounds over the last year or so which she believes is secondary to metformin. She complains of occasional right lower quadrant abdominal pain and once in a while she may have a pain on the left side. This pain is mild and resolves spontaneously. Her colonoscopy by me last year in May 2011 and was found to have mild sigmoid diverticulosis and has quit eating nuts and she believes she is having less pain.   Current medications Current Outpatient Prescriptions   Medication  Sig  Dispense  Refill   .  Alum Hydroxide-Mag Carbonate (GAVISCON EXTRA RELIEF FORMULA) 160-105 MG CHEW  Chew by mouth as needed.           .  Alum Hydroxide-Mag Carbonate (GAVISCON PO)  Take by mouth. As needed          .  aspirin (ECOTRIN) 325 MG EC tablet  Take 325 mg by mouth daily. Patient states that she alternates taking the 325 mg and the 81 mg... She takes them every other day         .  aspirin 81 MG  tablet  Take 81 mg by mouth as directed. Every other day          .  beta carotene w/minerals (OCUVITE) tablet  Take 1 tablet by mouth daily.           .  Biotin 1000 MCG tablet  Take 1,000 mcg by mouth daily.           .  Calcium Carbonate-Vitamin D (CALTRATE 600+D) 600-400 MG-UNIT per tablet  Take 1 tablet by mouth 3 (three) times daily.          .  cholecalciferol (VITAMIN D) 1000 UNITS tablet  Take 2,000 Units by mouth daily.          .  cloNIDine (CATAPRES) 0.1 MG tablet  Take 0.1 mg by mouth 2 (two) times daily.          .  folic acid (FOLVITE) 800 MCG tablet  Take 400 mcg by mouth daily.           .  Glucosamine-Chondroitin (OSTEO BI-FLEX REGULAR STRENGTH PO)  Take 1 tablet by mouth daily.           Marland Kitchen  losartan (COZAAR) 100 MG tablet  Take 100 mg by mouth daily.           Marland Kitchen  metFORMIN (GLUCOPHAGE) 500 MG tablet  Take 500 mg by mouth 2 (two) times daily with a meal.           .  methylcellulose (CITRUCEL) oral powder  Take by mouth. 1 TBSP Twice Weekly          .  Multiple Vitamins-Minerals (MULTIVITAL) tablet  Take 1 tablet by mouth daily.           Marland Kitchen  NIFEdipine (PROCARDIA-XL/ADALAT CC) 30 MG 24 hr tablet  Take 1 tablet (30 mg total) by mouth daily.   30 tablet   11   .  Omega-3 Fatty Acids (FISH OIL) 1000 MG CAPS  Take by mouth daily.           Marland Kitchen  oxyCODONE-acetaminophen (PERCOCET) 5-325 MG per tablet  Take 1 tablet by mouth as needed.           .  torsemide (DEMADEX) 20 MG tablet  Take 20 mg by mouth daily. Take 1-2 tabs          .  traZODone (DESYREL) 50 MG tablet  Take 50 mg by mouth daily as needed.             past medical history She has chronic GERD and history of Schatzki's ring which was dilated twice by Dr. Jena Gauss initially in 90.s  and more recently in October 2004. She is screening colonoscopy in October 2004 another one in May 2011 because of heme positive stools. She has mild sigmoid diverticulosis. She has osteoporosis. 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 She had angioplasty to her artery in left leg in April 2003 and left carotid endarterectomy in September 2008 Appendectomy in 1963 Hysterectomy in 1970 Neck surgery in February in 1997 for disc disease and she had a bone graft from her left hip She had right knee replacement in November 2011 and had arthroscopy back in September 2009      Allergies as of 12/18/2010 - Review Complete 12/18/2010   Allergen  Reaction  Noted   .  Augmentin  Hives  12/18/2010   .  Ciprofloxacin  Nausea Only  12/18/2010   .  Erythromycin       .  Ibuprofen       .  BJY:NWGNFAOZHYQ+MVHQIONGE+XBMWUXLKGM acid+aspartame       .  Naproxen       .  Prednisone       .  Sulfamethoxazole w/trimethoprim                Physical Exam: BP 126/68  Pulse 72  Temp(Src) 97.4 F (36.3 C) (Oral)  Resp 18  Ht 5\' 3"  (1.6 m)  Wt 179 lb (81.194 kg)  BMI 31.71 kg/m2 Conjunctiva is pink. Sclerae nonicteric.   Oropharyngeal mucosa is normal   Neck masses or thyromegaly noted.   Cardiac exam with regular rhythm normal S1 and S2. No murmur or gallop noted.   Abdomen is full. Bowel sounds are normal. On palpation soft abdomen without tenderness organomegaly or masses. Rectal examination deferred.   No peripheral edema or clubbing noted.   Assessment   #1. Chronic GERD.patient is having daily symptoms. Prilosec worked for years but not anymore. PPI therapy may or may not be contradicting to her osteoporosis I am afraid her GERD symptoms cannot be noted since she  is quite symptomatic. I agree the idea of therapy with pantoprazole which is one of the weaker PPIs and if it works she can stay on it. #2. Chronic constipation. It is felt to be secondary to her medications particularly trazodone. She should be taking Citrucel every day rather than  twice a week and if it works great otherwise MiraLAX could be added. She is up-to-date on her colonoscopies. #3. Regarding her osteoporosis I would like for her to talk with Dr. Sherryll Burger if she is a candidate for parenteral therapy. Recommendations Continue anti-reflex measures Take pantoprazole 40 mg daily 30 minutes before breakfast by mouth. Take Citrucel one tablespoonful daily. If no benefit noted with pantoprazole within 2-3 weeks will switch her to another PPI. Patient will let us know.

## 2011-01-15 ENCOUNTER — Telehealth (INDEPENDENT_AMBULATORY_CARE_PROVIDER_SITE_OTHER): Payer: Self-pay | Admitting: Internal Medicine

## 2011-01-15 NOTE — Telephone Encounter (Signed)
Continues to have esophageal burning and acid reflux. Protonix is not controlling her symptoms. Will switch her to Dexilant. Will give her a month supply to try.

## 2011-01-15 NOTE — Telephone Encounter (Signed)
Was told by Dr. Karilyn Cota to call and let him know how she is doing on the Protonics. Debra Hopkins is having burning at the bottom of her esophagus. Would like a return call to [phone].

## 2011-01-15 NOTE — Telephone Encounter (Signed)
Patient states that she is having burning at the bottom of the esophagus but that it is not all the time, She is taking the Protonix 40 mg about 30 min. Prior to her breakfast, She has bee on the Prilosec for years and the Protonix is the medication that her doctor put her on. She would like to be advised as what to do. She is following a GERD diet, avoiding things that may cause this burning.

## 2011-01-16 ENCOUNTER — Telehealth (INDEPENDENT_AMBULATORY_CARE_PROVIDER_SITE_OTHER): Payer: Self-pay | Admitting: Internal Medicine

## 2011-01-16 NOTE — Telephone Encounter (Signed)
Was given samples this morning and would like to know how to take them. Please return the call to [phone].

## 2011-01-17 NOTE — Telephone Encounter (Signed)
Patient was called and directed to take the Dexilant 1 by mouth 30 minutes before breakfast.

## 2011-02-11 ENCOUNTER — Telehealth (INDEPENDENT_AMBULATORY_CARE_PROVIDER_SITE_OTHER): Payer: Self-pay | Admitting: *Deleted

## 2011-02-11 NOTE — Telephone Encounter (Signed)
Patient called and said she took her last Dexilant. She need an rx faxed to pharmacy. The return phone number is [phone].

## 2011-02-12 ENCOUNTER — Encounter: Payer: Self-pay | Admitting: Vascular Surgery

## 2011-02-12 NOTE — Telephone Encounter (Signed)
Dexilant 60 mg called to [facility] per Dr. Karilyn Cota. Patient to take 1 by mouth every morning. #30 with 11 refills.

## 2011-02-13 NOTE — Telephone Encounter (Signed)
Called to let Debra Hopkins know her medication was called in to [facility]. DMM

## 2011-02-18 ENCOUNTER — Encounter: Payer: Self-pay | Admitting: Vascular Surgery

## 2011-02-19 ENCOUNTER — Encounter: Payer: Self-pay | Admitting: Vascular Surgery

## 2011-02-19 ENCOUNTER — Encounter (INDEPENDENT_AMBULATORY_CARE_PROVIDER_SITE_OTHER): Payer: Medicare Other | Admitting: *Deleted

## 2011-02-19 ENCOUNTER — Ambulatory Visit (INDEPENDENT_AMBULATORY_CARE_PROVIDER_SITE_OTHER): Payer: Medicare Other | Admitting: Vascular Surgery

## 2011-02-19 ENCOUNTER — Other Ambulatory Visit (INDEPENDENT_AMBULATORY_CARE_PROVIDER_SITE_OTHER): Payer: Medicare Other | Admitting: *Deleted

## 2011-02-19 VITALS — BP 153/80 | HR 61 | Resp 16 | Ht 63.0 in | Wt 180.0 lb

## 2011-02-19 DIAGNOSIS — Z48812 Encounter for surgical aftercare following surgery on the circulatory system: Secondary | ICD-10-CM

## 2011-02-19 DIAGNOSIS — I6529 Occlusion and stenosis of unspecified carotid artery: Secondary | ICD-10-CM

## 2011-02-19 DIAGNOSIS — I70219 Atherosclerosis of native arteries of extremities with intermittent claudication, unspecified extremity: Secondary | ICD-10-CM

## 2011-02-19 NOTE — Progress Notes (Signed)
Subjective:     Patient ID: Debra Hopkins, female   DOB: May 19, 1940, 70 y.o.   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 She underwent left carotid endarterectomy in 2008 is had no neurologic symptoms since that time. She denies lateralizing weakness, amaurosis fugax, diplopia, blurred vision, or syncope. She has had no previous stroke. She does have some moderate left leg claudication symptoms which he states requires her to stop after walking 55 yards and she must rest briefly and then proceed. She has no rest pain or history of nonhealing ulcerations. She did have PTA to left superficial femoral artery lesions performed by me in 2003.  Past Medical History  Diagnosis Date  . Unspecified essential hypertension   . Other and unspecified hyperlipidemia   . PVD (peripheral vascular disease)   . Dyspnea   . GERD (gastroesophageal reflux disease)   . Arthritis   . Diabetes mellitus     Type 2  . Fibromyalgia   . TIA (transient ischemic attack)   . Cataracts, bilateral   . Varicose veins   . Impaired hearing   . History of shingles   . CAD (coronary artery disease)   . Hemorrhoids   . Diverticulitis   . DDD (degenerative disc disease)   . Macular degeneration     History  Substance Use Topics  . Smoking status: Former Smoker    Types: Cigarettes    Quit date: 03/11/1985  . Smokeless tobacco: Never Used  . Alcohol Use: No    Family History  Problem Relation Age of Onset  . Coronary artery disease Other   . Stroke Mother   . Hypertension Mother   . Stroke Father   . Hypertension Father   . Diabetes Father   . Coronary artery disease Father   . Diabetes Sister   . Hypertension Sister   . Lung cancer Brother   . Sinusitis Brother   . Healthy Brother   . Diabetes Son   . Hypertension Son     Allergies  Allergen Reactions  . Augmentin Hives  . Ciprofloxacin Nausea  Only    Dizziness  . Erythromycin     REACTION: Upset stomach  . Ibuprofen     REACTION: nausea  . JYN:WGNFAOZHYQM+VHQIONGEX+BMWUXLKGMW Acid+Aspartame     REACTION: hives  . Naproxen     REACTION: severe nausea  . Prednisone     REACTION: itchy rash  . Sulfamethoxazole W/Trimethoprim     REACTION: Upset stomach    Current outpatient prescriptions:Alum Hydroxide-Mag Carbonate (GAVISCON EXTRA RELIEF FORMULA) 160-105 MG CHEW, Chew by mouth as needed.  , Disp: , Rfl: ;  Alum Hydroxide-Mag Carbonate (GAVISCON PO), Take by mouth. As needed , Disp: , Rfl: ;  aspirin (ECOTRIN) 325 MG EC tablet, Take 325 mg by mouth daily. Patient states that she alternates taking the 325 mg and the 81 mg... She takes them every other day, Disp: , Rfl:  aspirin 81 MG tablet, Take 81 mg by mouth as directed. Every other day , Disp: , Rfl: ;  beta carotene w/minerals (OCUVITE) tablet, Take 1 tablet by mouth daily.  , Disp: , Rfl: ;  Biotin 1000 MCG tablet, Take 1,000 mcg by mouth daily.  , Disp: , Rfl: ;  calcium carbonate (TUMS - DOSED IN MG ELEMENTAL CALCIUM) 500 MG chewable tablet, Chew 1 tablet by mouth as needed.  , Disp: , Rfl:  Calcium Carbonate-Vitamin  D (CALTRATE 600+D) 600-400 MG-UNIT per tablet, Take 1 tablet by mouth 3 (three) times daily. , Disp: , Rfl: ;  cholecalciferol (VITAMIN D) 1000 UNITS tablet, Take 2,000 Units by mouth daily. , Disp: , Rfl: ;  cloNIDine (CATAPRES) 0.1 MG tablet, Take 0.1 mg by mouth 2 (two) times daily. , Disp: , Rfl: ;  dexlansoprazole (DEXILANT) 60 MG capsule, Take 60 mg by mouth daily.  , Disp: , Rfl:  folic acid (FOLVITE) 800 MCG tablet, Take 400 mcg by mouth daily.  , Disp: , Rfl: ;  Glucosamine-Chondroitin (OSTEO BI-FLEX REGULAR STRENGTH PO), Take 1 tablet by mouth daily.  , Disp: , Rfl: ;  losartan (COZAAR) 100 MG tablet, Take 100 mg by mouth daily.  , Disp: , Rfl: ;  metFORMIN (GLUCOPHAGE) 500 MG tablet, Take 500 mg by mouth 2 (two) times daily with a meal.  , Disp: , Rfl:    methylcellulose (CITRUCEL) oral powder, Take by mouth. 1 TBSP Twice Weekly , Disp: , Rfl: ;  Multiple Vitamins-Minerals (MULTIVITAL) tablet, Take 1 tablet by mouth daily.  , Disp: , Rfl: ;  NIFEdipine (PROCARDIA-XL/ADALAT CC) 30 MG 24 hr tablet, Take 1 tablet (30 mg total) by mouth daily., Disp: 30 tablet, Rfl: 11;  Omega-3 Fatty Acids (FISH OIL) 1000 MG CAPS, Take by mouth daily.  , Disp: , Rfl:  oxyCODONE-acetaminophen (PERCOCET) 5-325 MG per tablet, Take 1 tablet by mouth as needed.  , Disp: , Rfl: ;  simvastatin (ZOCOR) 20 MG tablet, Take 10 mg by mouth at bedtime.  , Disp: , Rfl: ;  torsemide (DEMADEX) 20 MG tablet, Take 20 mg by mouth daily. Take 1-2 tabs , Disp: , Rfl: ;  traZODone (DESYREL) 50 MG tablet, Take 50 mg by mouth daily as needed.  , Disp: , Rfl:   BP 153/80  Pulse 61  Resp 16  Ht 5\' 3"  (1.6 m)  Wt 180 lb (81.647 kg)  BMI 31.89 kg/m2  SpO2 97%  Body mass index is 31.89 kg/(m^2).         Review of Systems Denies chest pain also denies dyspnea on exertion, PND, orthopnea. Does have swelling in her legs and occasional esophageal reflux symptoms. Other systems are negative and complete review of    Objective:   Physical Exam blood pressure 103 radio rate 61 respirations 16 General she is well-developed well-nourished female no apparent stress alert and oriented x3 Lungs no rhonchi or wheezing Cardiovascular regular rhythm no murmurs carotid pulses 3+ with soft bruit on the left [place] in the right Neurologic exam normal Abdomen soft nontender with no masses 3+ femoral 2+ popliteal pulse on the right with 2+ femoral and 2+ popliteal pulse on the left. Both feet are well-perfused. Skin no lesions Musculoskeletal free major deformities Today I ordered a carotid duplex exam and lower extremity arterial Dopplers which are reviewed and interpreted. There is no flow reduction of the left carotid endarterectomy site. There is no significant flow reduction the right internal  carotid. 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

## 2011-02-19 NOTE — Progress Notes (Signed)
Addended by: Adria Dill L on: 02/19/2011 03:47 PM   Modules accepted: Orders

## 2011-03-07 NOTE — Procedures (Unsigned)
CAROTID DUPLEX EXAM  INDICATION:  Left carotid endarterectomy.  HISTORY: Diabetes:  Yes. Cardiac:  No. Hypertension:  Yes. Smoking:  Previous. Previous Surgery:  Left carotid endarterectomy on 11/30/2006. CV History:  Currently asymptomatic. Amaurosis Fugax No, Paresthesias No, Hemiparesis No.                                      RIGHT             LEFT Brachial systolic pressure:         151               144 Brachial Doppler waveforms:         Normal            Normal Vertebral direction of flow:        Antegrade         Antegrade DUPLEX VELOCITIES (cm/sec) CCA peak systolic                   75                81 ECA peak systolic                   173               108 ICA peak systolic                   71                96 ICA end diastolic                   21                28 PLAQUE MORPHOLOGY:                  Heterogenous PLAQUE AMOUNT:                      Mild              None PLAQUE LOCATION:                    ICA/ECA  IMPRESSION: 1. Patent left carotid endarterectomy site with no hemodynamically     significant stenoses of the bilateral internal carotid arteries. 2. No significant change noted when compared to the previous     examination on 02/27/2010.  ___________________________________________ Quita Skye. Hart Rochester, M.D.  CH/MEDQ  D:  02/21/2011  T:  02/21/2011  Job:  [id number]

## 2011-04-08 ENCOUNTER — Telehealth (INDEPENDENT_AMBULATORY_CARE_PROVIDER_SITE_OTHER): Payer: Self-pay | Admitting: *Deleted

## 2011-04-08 NOTE — Telephone Encounter (Signed)
Debra Hopkins would like to know if [name] has spoke with Dr. Karilyn Cota about another medication she can in place of the Dexilant 60 mg. The return phone number is [phone].

## 2011-04-10 NOTE — Telephone Encounter (Signed)
I talked with the patient today. She has tried Nexium, pantoprazole, omeprazole , prilosec. I told the patient that I would check with her Insurance company and see about a prior authorization. The patient will be called when this is addressed with the [employer].

## 2011-05-07 ENCOUNTER — Other Ambulatory Visit (INDEPENDENT_AMBULATORY_CARE_PROVIDER_SITE_OTHER): Payer: Self-pay | Admitting: Internal Medicine

## 2012-02-24 ENCOUNTER — Encounter: Payer: Self-pay | Admitting: Vascular Surgery

## 2012-02-25 ENCOUNTER — Ambulatory Visit (INDEPENDENT_AMBULATORY_CARE_PROVIDER_SITE_OTHER): Payer: Medicare Other | Admitting: Vascular Surgery

## 2012-02-25 ENCOUNTER — Encounter: Payer: Self-pay | Admitting: Neurosurgery

## 2012-02-25 ENCOUNTER — Ambulatory Visit (INDEPENDENT_AMBULATORY_CARE_PROVIDER_SITE_OTHER): Payer: Medicare Other | Admitting: Neurosurgery

## 2012-02-25 VITALS — BP 153/73 | HR 69 | Resp 16 | Ht 63.0 in | Wt 178.0 lb

## 2012-02-25 DIAGNOSIS — I739 Peripheral vascular disease, unspecified: Secondary | ICD-10-CM

## 2012-02-25 DIAGNOSIS — Z48812 Encounter for surgical aftercare following surgery on the circulatory system: Secondary | ICD-10-CM

## 2012-02-25 DIAGNOSIS — I6529 Occlusion and stenosis of unspecified carotid artery: Secondary | ICD-10-CM

## 2012-02-25 DIAGNOSIS — I70219 Atherosclerosis of native arteries of extremities with intermittent claudication, unspecified extremity: Secondary | ICD-10-CM

## 2012-02-25 NOTE — Progress Notes (Signed)
[facility] Carotid Office Note  CC: Carotid and PAD surveillance Referring Physician: Hart Rochester  History of Present Illness: 71 year old female patient of Dr. Hart Rochester who status post left CEA in September 2008, left SFA PTA with stent 2003. The patient denies any signs or symptoms of CVA, TIA, amaurosis fugax or any neural deficit. The patient denies claudication except for very long walks, and this is not life style limiting, she denies rest pain and has no open ulcerations on her lower extremities. The patient's husband did expire unexpectedly several months ago and she is having difficulty with this.  Past Medical History  Diagnosis Date  . Unspecified essential hypertension   . Other and unspecified hyperlipidemia   . PVD (peripheral vascular disease)   . Dyspnea   . GERD (gastroesophageal reflux disease)   . Arthritis   . Diabetes mellitus     Type 2  . Fibromyalgia   . TIA (transient ischemic attack)   . Cataracts, bilateral   . Varicose veins   . Impaired hearing   . History of shingles   . CAD (coronary artery disease)   . Hemorrhoids   . Diverticulitis   . DDD (degenerative disc disease)   . Macular degeneration     ROS: [x]  Positive   [ ]  Denies    General: [ ]  Weight loss, [ ]  Fever, [ ]  chills Neurologic: [ ]  Dizziness, [ ]  Blackouts, [ ]  Seizure [ ]  Stroke, [ ]  "Mini stroke", [ ]  Slurred speech, [ ]  Temporary blindness; [ ]  weakness in arms or legs, [ ]  Hoarseness Cardiac: [ ]  Chest pain/pressure, [ ]  Shortness of breath at rest [ ]  Shortness of breath with exertion, [ ]  Atrial fibrillation or irregular heartbeat Vascular: [ ]  Pain in legs with walking, [ ]  Pain in legs at rest, [ ]  Pain in legs at night,  [ ]  Non-healing ulcer, [ ]  Blood clot in vein/DVT,   Pulmonary: [ ]  Home oxygen, [ ]  Productive cough, [ ]  Coughing up blood, [ ]  Asthma,  [ ]  Wheezing Musculoskeletal:  [ ]  Arthritis, [ ]  Low back pain, [ ]  Joint pain Hematologic: [  ] Easy Bruising, [ ]  Anemia; [ ]  Hepatitis Gastrointestinal: [ ]  Blood in stool, [ ]  Gastroesophageal Reflux/heartburn, [ ]  Trouble swallowing Urinary: [ ]  chronic Kidney disease, [ ]  on HD - [ ]  MWF or [ ]  TTHS, [ ]  Burning with urination, [ ]  Difficulty urinating Skin: [ ]  Rashes, [ ]  Wounds Psychological: [ ]  Anxiety, [ ]  Depression   Social History History  Substance Use Topics  . Smoking status: Former Smoker    Types: Cigarettes    Quit date: 03/11/1985  . Smokeless tobacco: Never Used  . Alcohol Use: No    Family History Family History  Problem Relation Age of Onset  . Coronary artery disease Other   . Stroke Mother   . Hypertension Mother   . Stroke Father   . Hypertension Father   . Diabetes Father   . Coronary artery disease Father   . Diabetes Sister   . Hypertension Sister   . Lung cancer Brother   . Sinusitis Brother   . Healthy Brother   . Diabetes Son   . Hypertension Son     Allergies  Allergen Reactions  . Amoxicillin-Pot Clavulanate     REACTION: hives  . Amoxicillin-Pot Clavulanate Hives  . Ciprofloxacin Nausea Only    Dizziness  . Erythromycin  REACTION: Upset stomach  . Ibuprofen     REACTION: nausea  . Naproxen     REACTION: severe nausea  . Prednisone     REACTION: itchy rash  . Sulfamethoxazole W-Trimethoprim     REACTION: Upset stomach    Current Outpatient Prescriptions  Medication Sig Dispense Refill  . aspirin (ECOTRIN) 325 MG EC tablet Take 325 mg by mouth daily. Patient states that she alternates taking the 325 mg and the 81 mg... She takes them every other day      . aspirin 81 MG tablet Take 81 mg by mouth as directed. Every other day       . beta carotene w/minerals (OCUVITE) tablet Take 1 tablet by mouth daily.        . Biotin 1000 MCG tablet Take 1,000 mcg by mouth daily.        . calcium carbonate (TUMS - DOSED IN MG ELEMENTAL CALCIUM) 500 MG chewable tablet Chew 1 tablet by mouth as needed.        . cloNIDine  (CATAPRES) 0.1 MG tablet Take 0.1 mg by mouth 2 (two) times daily.       . folic acid (FOLVITE) 800 MCG tablet Take 400 mcg by mouth daily.        . Glucosamine-Chondroitin (OSTEO BI-FLEX REGULAR STRENGTH PO) Take 1 tablet by mouth daily.        Marland Kitchen losartan (COZAAR) 100 MG tablet Take 100 mg by mouth daily.        . metFORMIN (GLUCOPHAGE) 500 MG tablet Take 500 mg by mouth 2 (two) times daily with a meal.        . methylcellulose (CITRUCEL) oral powder Take by mouth. 1 TBSP Twice Weekly       . Multiple Vitamins-Minerals (MULTIVITAL) tablet Take 1 tablet by mouth daily.        Marland Kitchen NEXIUM 40 MG capsule TAKE 1 CAPSULE BY MOUTH BEFORE BREAKFAST  30 capsule  5  . NIFEdipine (PROCARDIA-XL/ADALAT CC) 30 MG 24 hr tablet Take 1 tablet (30 mg total) by mouth daily.  30 tablet  11  . Omega-3 Fatty Acids (FISH OIL) 1000 MG CAPS Take by mouth daily.        Marland Kitchen oxyCODONE-acetaminophen (PERCOCET) 5-325 MG per tablet Take 1 tablet by mouth as needed.        . simvastatin (ZOCOR) 20 MG tablet Take 20 mg by mouth at bedtime.       . torsemide (DEMADEX) 20 MG tablet Take 20 mg by mouth daily. Take 1-2 tabs       . traZODone (DESYREL) 50 MG tablet Take 50 mg by mouth daily as needed.        . Alum Hydroxide-Mag Carbonate (GAVISCON EXTRA RELIEF FORMULA) 160-105 MG CHEW Chew by mouth as needed.        . cholecalciferol (VITAMIN D) 1000 UNITS tablet Take 2,000 Units by mouth daily.       Marland Kitchen dexlansoprazole (DEXILANT) 60 MG capsule Take 60 mg by mouth daily.          Physical Examination  Filed Vitals:   02/25/12 1411  BP: 153/73  Pulse: 69  Resp:     Body mass index is 31.53 kg/(m^2).  General:  WDWN in NAD Gait: Normal HEENT: WNL Eyes: Pupils equal Pulmonary: normal non-labored breathing , without Rales, rhonchi,  wheezing Cardiac: RRR, without  Murmurs, rubs or gallops; Abdomen: soft, NT, no masses Skin: no rashes, ulcers noted  Vascular Exam Pulses: 3+  radial pulses bilaterally, palpable femoral  pulses bilaterally, she has PT and DP pulses bilaterally Carotid bruits: Carotid pulses to auscultation no bruits are heard Extremities without ischemic changes, no Gangrene , no cellulitis; no open wounds;  Musculoskeletal: no muscle wasting or atrophy   Neurologic: A&O X 3; Appropriate Affect ; SENSATION: normal; MOTOR FUNCTION:  moving all extremities equally. Speech is fluent/normal  Non-Invasive Vascular Imaging CAROTID DUPLEX 02/25/2012  Right ICA 20 - 39 % stenosis Left ICA 20 - 39 % stenosis ABIs today are 0.77 on the right, 0.63 on the left, she has triphasic bilaterally, the ABIs had declined somewhat since previous exam one year ago but she is again triphasic and has no claudication or rest pain  ASSESSMENT/PLAN: Patient with only mild claudication with greater than 1 mile walking, the patient will followup in one year with repeat carotid duplex and ABIs. She knows to call the office if she has difficulty in the interim. The patient's questions were encouraged and answered, she is in agreement with this plan. Next  Lauree Chandler ANP   Clinic MD: Hart Rochester

## 2012-02-25 NOTE — Progress Notes (Signed)
Carotid duplex performed @ [facility] 02/25/2012

## 2012-02-25 NOTE — Progress Notes (Signed)
Ankle brachial indices performed @ [facility] 02/25/2012 

## 2012-03-30 ENCOUNTER — Other Ambulatory Visit: Payer: Self-pay | Admitting: *Deleted

## 2012-03-30 DIAGNOSIS — I739 Peripheral vascular disease, unspecified: Secondary | ICD-10-CM

## 2012-03-30 DIAGNOSIS — I6529 Occlusion and stenosis of unspecified carotid artery: Secondary | ICD-10-CM

## 2012-12-07 ENCOUNTER — Telehealth: Payer: Self-pay

## 2012-12-07 NOTE — Telephone Encounter (Signed)
She just came up in normal recall from 2004 apparently. We had no way of knowing she had one in 2011 with Dr. Karilyn Cota.  She can f/u with Dr. Karilyn Cota.

## 2012-12-07 NOTE — Telephone Encounter (Signed)
LATE ENTRY:  Pt called on Friday and said she received a letter that it was time for her to have her next colonoscopy. She said it was no way she just had one by NUR in 2011.   Our records indicate her last one here was with DR. Rourk  01/03/2003 . She had one by Dr. Karilyn Cota 08/04/2009 at [facility]  The report on 08/01/2009 had:  Final diagnoses: 1. Examination performed to cecum 2. A few small diverticula at sigmoid colon, external hemorrhoids, otherwise normal examination.   Recommendations:  1. Standard instructions given.   She is not having any problems now, and said she does not need one and when she does she wants to have the next one with Dr. Karilyn Cota.

## 2012-12-24 ENCOUNTER — Emergency Department (HOSPITAL_COMMUNITY): Payer: Medicare Other

## 2012-12-24 ENCOUNTER — Emergency Department (HOSPITAL_COMMUNITY)
Admission: EM | Admit: 2012-12-24 | Discharge: 2012-12-24 | Disposition: A | Discharge status: Home / Self Care | Payer: Medicare Other | Attending: Emergency Medicine | Admitting: Emergency Medicine

## 2012-12-24 ENCOUNTER — Encounter (HOSPITAL_COMMUNITY): Payer: Self-pay | Admitting: Emergency Medicine

## 2012-12-24 DIAGNOSIS — E785 Hyperlipidemia, unspecified: Secondary | ICD-10-CM | POA: Insufficient documentation

## 2012-12-24 DIAGNOSIS — Z7982 Long term (current) use of aspirin: Secondary | ICD-10-CM | POA: Insufficient documentation

## 2012-12-24 DIAGNOSIS — Z87891 Personal history of nicotine dependence: Secondary | ICD-10-CM | POA: Insufficient documentation

## 2012-12-24 DIAGNOSIS — Z8619 Personal history of other infectious and parasitic diseases: Secondary | ICD-10-CM | POA: Insufficient documentation

## 2012-12-24 DIAGNOSIS — I2510 Atherosclerotic heart disease of native coronary artery without angina pectoris: Secondary | ICD-10-CM | POA: Insufficient documentation

## 2012-12-24 DIAGNOSIS — X58XXXA Exposure to other specified factors, initial encounter: Secondary | ICD-10-CM | POA: Insufficient documentation

## 2012-12-24 DIAGNOSIS — Y939 Activity, unspecified: Secondary | ICD-10-CM | POA: Insufficient documentation

## 2012-12-24 DIAGNOSIS — K219 Gastro-esophageal reflux disease without esophagitis: Secondary | ICD-10-CM | POA: Insufficient documentation

## 2012-12-24 DIAGNOSIS — Y929 Unspecified place or not applicable: Secondary | ICD-10-CM | POA: Insufficient documentation

## 2012-12-24 DIAGNOSIS — E119 Type 2 diabetes mellitus without complications: Secondary | ICD-10-CM | POA: Insufficient documentation

## 2012-12-24 DIAGNOSIS — I10 Essential (primary) hypertension: Secondary | ICD-10-CM | POA: Insufficient documentation

## 2012-12-24 DIAGNOSIS — M129 Arthropathy, unspecified: Secondary | ICD-10-CM | POA: Insufficient documentation

## 2012-12-24 DIAGNOSIS — Z8669 Personal history of other diseases of the nervous system and sense organs: Secondary | ICD-10-CM | POA: Insufficient documentation

## 2012-12-24 DIAGNOSIS — Z880 Allergy status to penicillin: Secondary | ICD-10-CM | POA: Insufficient documentation

## 2012-12-24 DIAGNOSIS — Z8673 Personal history of transient ischemic attack (TIA), and cerebral infarction without residual deficits: Secondary | ICD-10-CM | POA: Insufficient documentation

## 2012-12-24 DIAGNOSIS — IMO0001 Reserved for inherently not codable concepts without codable children: Secondary | ICD-10-CM | POA: Insufficient documentation

## 2012-12-24 DIAGNOSIS — S0300XA Dislocation of jaw, unspecified side, initial encounter: Secondary | ICD-10-CM | POA: Insufficient documentation

## 2012-12-24 DIAGNOSIS — Z79899 Other long term (current) drug therapy: Secondary | ICD-10-CM | POA: Insufficient documentation

## 2012-12-24 MED ORDER — SODIUM CHLORIDE 0.9 % IV BOLUS (SEPSIS)
1000.0000 mL | Freq: Once | INTRAVENOUS | Status: AC
  Administered 2012-12-24: 1000 mL via INTRAVENOUS

## 2012-12-24 MED ORDER — PROPOFOL 10 MG/ML IV BOLUS
200.0000 mg | Freq: Once | INTRAVENOUS | Status: AC
  Administered 2012-12-24: 70 mg via INTRAVENOUS
  Filled 2012-12-24: qty 20

## 2012-12-24 MED ORDER — HYDROCODONE-ACETAMINOPHEN 5-325 MG PO TABS: 1.0000 | ORAL_TABLET | Freq: Four times a day (QID) | ORAL | Status: DC | PRN

## 2012-12-24 NOTE — ED Notes (Signed)
Procedure uneventfull.

## 2012-12-24 NOTE — ED Notes (Signed)
Received report from off going RN and introduced self to the pt.  Continue to monitor the pt 

## 2012-12-24 NOTE — ED Provider Notes (Addendum)
CSN: [id number]     Arrival date & time 12/24/12  1448 History   First MD Initiated Contact with Patient 12/24/12 1500     Chief Complaint  Patient presents with  . Unable to close mouth    (Consider location/radiation/quality/duration/timing/severity/associated sxs/prior Treatment) HPI   This is a 72 year old female who presents with a TMJ dislocation. Patient was at the dentist this morning she states at 11 AM she had her mouth and was unable to close it. She reports pain in her jaw. Both the dentist and the patient's doctor tried to reduce the dislocation without drugs. They were unsuccessful. Patient denies any other complaints at this time.  Past medical history is significant for diabetes, TIA, coronary artery disease. Past Medical History  Diagnosis Date  . Unspecified essential hypertension   . Other and unspecified hyperlipidemia   . PVD (peripheral vascular disease)   . Dyspnea   . GERD (gastroesophageal reflux disease)   . Arthritis   . Diabetes mellitus     Type 2  . Fibromyalgia   . TIA (transient ischemic attack)   . Cataracts, bilateral   . Varicose veins   . Impaired hearing   . History of shingles   . CAD (coronary artery disease)   . Hemorrhoids   . Diverticulitis   . DDD (degenerative disc disease)   . Macular degeneration    Past Surgical History  Procedure Laterality Date  . Breast biopsy    . Kidney stone surgery    . Aspiration of a cyst in the right breast    . Total abdominal hysterectomy    . Appendectomy    . Cardiac catheterization    . Spine surgery  1997    C5-c6 fusion  . Carotid endarterectomy  2008    left CEA  . Knee arthroscopy  2009  . Joint replacement  2011    Right total knee replacement   Family History  Problem Relation Age of Onset  . Coronary artery disease Other   . Stroke Mother   . Hypertension Mother   . Stroke Father   . Hypertension Father   . Diabetes Father   . Coronary artery disease Father   . Diabetes  Sister   . Hypertension Sister   . Lung cancer Brother   . Sinusitis Brother   . Healthy Brother   . Diabetes Son   . Hypertension Son    History  Substance Use Topics  . Smoking status: Former Smoker    Types: Cigarettes    Quit date: 03/11/1985  . Smokeless tobacco: Never Used  . Alcohol Use: No   OB History   Grav Para Term Preterm Abortions TAB SAB Ect Mult Living                 Review of Systems  HENT:       Mouth pain and stiffness  Respiratory: Negative for shortness of breath.   Cardiovascular: Negative for chest pain.    Allergies  Amoxicillin-pot clavulanate; Amoxicillin-pot clavulanate; Ciprofloxacin; Erythromycin; Ibuprofen; Naproxen; Prednisone; and Sulfamethoxazole-trimethoprim  Home Medications   Current Outpatient Rx  Name  Route  Sig  Dispense  Refill  . ALPRAZolam (XANAX) 0.5 MG tablet   Oral   Take 0.5 mg by mouth once.         Marland Kitchen aspirin (ECOTRIN) 325 MG EC tablet   Oral   Take 325 mg by mouth daily. Patient states that she alternates taking the 325 mg and the  81 mg... She takes them every other day         . Biotin 1000 MCG tablet   Oral   Take 1,000 mcg by mouth daily.           . calcium carbonate (TUMS - DOSED IN MG ELEMENTAL CALCIUM) 500 MG chewable tablet   Oral   Chew 1 tablet by mouth as needed.           . cholecalciferol (VITAMIN D) 1000 UNITS tablet   Oral   Take 2,000 Units by mouth daily.          . cloNIDine (CATAPRES) 0.1 MG tablet   Oral   Take 0.1 mg by mouth 2 (two) times daily.          . cloNIDine (CATAPRES) 0.1 MG tablet   Oral   Take 0.1 mg by mouth 3 (three) times daily.         Marland Kitchen co-enzyme Q-10 30 MG capsule   Oral   Take 100 mg by mouth daily.         Marland Kitchen esomeprazole (NEXIUM) 40 MG capsule   Oral   Take 40 mg by mouth daily before breakfast.         . folic acid (FOLVITE) 800 MCG tablet   Oral   Take 400 mcg by mouth daily.           . Glucosamine-Chondroitin (OSTEO BI-FLEX  REGULAR STRENGTH PO)   Oral   Take 1 tablet by mouth daily.           . hydrALAZINE (APRESOLINE) 25 MG tablet   Oral   Take 25 mg by mouth daily.         Marland Kitchen levothyroxine (SYNTHROID, LEVOTHROID) 25 MCG tablet   Oral   Take 25 mcg by mouth daily before breakfast.         . losartan (COZAAR) 100 MG tablet   Oral   Take 100 mg by mouth daily.           . metFORMIN (GLUCOPHAGE) 500 MG tablet   Oral   Take 500 mg by mouth 2 (two) times daily with a meal.           . methylcellulose (CITRUCEL) oral powder   Oral   Take by mouth. 1 TBSP Twice Weekly          . Multiple Minerals-Vitamins (CALCIUM CITRATE PLUS PO)   Oral   Take 3 tablets by mouth daily. 1000mg  of calcium with 400units of vitamin D and 500mg  of magnesium.         . Multiple Vitamins-Minerals (MULTIVITAL) tablet   Oral   Take 3 tablets by mouth daily.          . Multiple Vitamins-Minerals (PRESERVISION AREDS 2 PO)   Oral   Take 2 tablets by mouth daily.         . Omega-3 Fatty Acids (FISH OIL) 1000 MG CAPS   Oral   Take 1,100 mg by mouth daily.          Marland Kitchen oxyCODONE-acetaminophen (PERCOCET) 5-325 MG per tablet   Oral   Take 1 tablet by mouth every 4 (four) hours as needed for pain.          . simvastatin (ZOCOR) 20 MG tablet   Oral   Take 20 mg by mouth at bedtime.          . torsemide (DEMADEX) 20 MG tablet   Oral  Take 20-40 mg by mouth daily. Take 1-2 tabs         . traZODone (DESYREL) 50 MG tablet   Oral   Take 100 mg by mouth at bedtime.          . beta carotene w/minerals (OCUVITE) tablet   Oral   Take 1 tablet by mouth daily.           Marland Kitchen HYDROcodone-acetaminophen (NORCO/VICODIN) 5-325 MG per tablet   Oral   Take 1 tablet by mouth every 6 (six) hours as needed for pain.   10 tablet   0    BP 119/72  Pulse 65  Temp(Src) 97.9 F (36.6 C) (Oral)  Resp 11  Wt 188 lb (85.276 kg)  BMI 33.31 kg/m2  SpO2 100% Physical Exam  Nursing note and vitals  reviewed. Constitutional: She is oriented to person, place, and time. She appears well-developed and well-nourished. No distress.  HENT:  Head: Normocephalic and atraumatic.  Mouth/Throat: Oropharynx is clear and moist.  Mouth held in a an open position.  Unable to close fully.  Palpation of bilateral TMJs reveals symmetry and decreased ROM.  Eyes: Pupils are equal, round, and reactive to light.  Neck: Neck supple.  Cardiovascular: Normal rate and normal heart sounds.   Pulmonary/Chest: Effort normal. No respiratory distress.  Neurological: She is alert and oriented to person, place, and time.  Skin: Skin is warm and dry.  Psychiatric: She has a normal mood and affect.    ED Course  Reduction of dislocation Date/Time: 12/25/2012 12:41 AM Performed by: Ross Marcus, F Authorized by: Ross Marcus, F Consent: written consent obtained. Risks and benefits: risks, benefits and alternatives were discussed Consent given by: patient Patient understanding: patient states understanding of the procedure being performed Local anesthesia used: no Patient sedated: yes Sedation type: moderate (conscious) sedation Sedatives: propofol Vitals: Vital signs were monitored during sedation. Patient tolerance: Patient tolerated the procedure well with no immediate complications. Comments: Reduction of  bilateral TMJ dislocation.    Procedural sedation Performed by: Ross Marcus, F Consent: Verbal consent obtained. Risks and benefits: risks, benefits and alternatives were discussed Required items: required blood products, implants, devices, and special equipment available Patient identity confirmed: arm band and provided demographic data Time out: Immediately prior to procedure a "time out" was called to verify the correct patient, procedure, equipment, support staff and site/side marked as required.  Sedation type: moderate (conscious) sedation NPO time confirmed and  considedered  Sedatives: PROPOFOL  Physician Time at Bedside: 20 min  Vitals: Vital signs were monitored during sedation. Cardiac Monitor, pulse oximeter Patient tolerance: Patient tolerated the procedure well with no immediate complications. Comments: Pt with uneventful recovered. Returned to pre-procedural sedation baseline    (including critical care time) Labs Review Labs Reviewed - No data to display Imaging Review Dg Orthopantogram  12/24/2012   CLINICAL DATA:  TMJ dislocation during dental procedure. Pain.  EXAM: ORTHOPANTOGRAM/PANORAMIC  COMPARISON:  None.  FINDINGS: Multiple teeth are missing. Multiple areas of dental amalgam. No mandibular fracture. No obvious TMJ asymmetry. I suppose it is possible that there is bilateral anterior TMJ dislocation. If further investigation desired, CT maxillofacial recommended.  IMPRESSION: No visible TMJ asymmetry. Correlate clinically for bilateral TMJ dislocation. No visible mandibular fracture.   Electronically Signed   By: Davonna Belling M.D.   On: 12/24/2012 17:21    EKG Interpretation   None       MDM   1. TMJ (dislocation of temporomandibular joint), initial encounter  Patient presents with acute TMJ dislocation. Panorex is no asymmetry suggesting bilateral TMJ involvement. Patient was consented for conscious sedation and relocation. Patient tolerated procedure well. Patient was encouraged to follow a soft diet for the next 2-3 days. She's to followup with her primary care physician. She'll be given a short course of pain medication as she is likely to be sore.  After history, exam, and medical workup I feel the patient has been appropriately medically screened and is safe for discharge home. Pertinent diagnoses were discussed with the patient. Patient was given return precautions.     Shon Baton, MD 12/25/12 1610  Shon Baton, MD 01/05/13 [phone]

## 2012-12-24 NOTE — ED Notes (Signed)
Pt in xray

## 2012-12-24 NOTE — ED Notes (Signed)
Pt tx at dentist today at 9.  At 11 she opened her mouth and was unable to close it.  Pain to both jaws.  Dr Warren Danes attempted to reduce jaw, but was unsuccessful.  Pt told to come here for reduction with sedation.

## 2013-01-22 ENCOUNTER — Other Ambulatory Visit: Payer: Self-pay | Admitting: Vascular Surgery

## 2013-01-22 DIAGNOSIS — I6529 Occlusion and stenosis of unspecified carotid artery: Secondary | ICD-10-CM

## 2013-01-22 DIAGNOSIS — Z48812 Encounter for surgical aftercare following surgery on the circulatory system: Secondary | ICD-10-CM

## 2013-03-08 ENCOUNTER — Encounter: Payer: Self-pay | Admitting: Family

## 2013-03-09 ENCOUNTER — Encounter: Payer: Self-pay | Admitting: Family

## 2013-03-09 ENCOUNTER — Other Ambulatory Visit (HOSPITAL_COMMUNITY): Payer: Medicare Other

## 2013-03-09 ENCOUNTER — Ambulatory Visit (HOSPITAL_COMMUNITY)
Admission: RE | Admit: 2013-03-09 | Discharge: 2013-03-09 | Disposition: A | Discharge status: Home / Self Care | Payer: Medicare Other | Source: Ambulatory Visit | Attending: Family | Admitting: Family

## 2013-03-09 ENCOUNTER — Ambulatory Visit (INDEPENDENT_AMBULATORY_CARE_PROVIDER_SITE_OTHER)
Admission: RE | Admit: 2013-03-09 | Discharge: 2013-03-09 | Disposition: A | Discharge status: Home / Self Care | Payer: Medicare Other | Source: Ambulatory Visit | Attending: Family | Admitting: Family

## 2013-03-09 ENCOUNTER — Encounter (HOSPITAL_COMMUNITY): Payer: Medicare Other

## 2013-03-09 ENCOUNTER — Ambulatory Visit (INDEPENDENT_AMBULATORY_CARE_PROVIDER_SITE_OTHER): Payer: Medicare Other | Admitting: Family

## 2013-03-09 VITALS — BP 123/75 | HR 63 | Resp 16 | Ht 63.0 in | Wt 188.0 lb

## 2013-03-09 DIAGNOSIS — Z48812 Encounter for surgical aftercare following surgery on the circulatory system: Secondary | ICD-10-CM

## 2013-03-09 DIAGNOSIS — I739 Peripheral vascular disease, unspecified: Secondary | ICD-10-CM

## 2013-03-09 DIAGNOSIS — I6529 Occlusion and stenosis of unspecified carotid artery: Secondary | ICD-10-CM

## 2013-03-09 NOTE — Patient Instructions (Signed)
Stroke Prevention Some medical conditions and behaviors are associated with an increased chance of having a stroke. You may prevent a stroke by making healthy choices and managing medical conditions. Reduce your risk of having a stroke by:  Staying physically active. Get at least 30 minutes of activity on most or all days.  Not smoking. It may also be helpful to avoid exposure to secondhand smoke.  Limiting alcohol use. Moderate alcohol use is considered to be:  No more than 2 drinks per day for men.  No more than 1 drink per day for nonpregnant women.  Eating healthy foods.  Include 5 or more servings of fruits and vegetables a day.  Certain diets may be prescribed to address high blood pressure, high cholesterol, diabetes, or obesity.  Managing your cholesterol levels.  A low-saturated fat, low-trans fat, low-cholesterol, and high-fiber diet may control cholesterol levels.  Take any prescribed medicines to control cholesterol as directed by your caregiver.  Managing your diabetes.  A controlled-carbohydrate, controlled-sugar diet is recommended to manage diabetes.  Take any prescribed medicines to control diabetes as directed by your caregiver.  Controlling your high blood pressure (hypertension).  A low-salt (sodium), low-saturated fat, low-trans fat, and low-cholesterol diet is recommended to manage high blood pressure.  Take any prescribed medicines to control hypertension as directed by your caregiver.  Maintaining a healthy weight.  A reduced-calorie, low-sodium, low-saturated fat, low-trans fat, low-cholesterol diet is recommended to manage weight.  Stopping drug abuse.  Avoiding birth control pills.  Talk to your caregiver about the risks of taking birth control pills if you are over 35 years old, smoke, get migraines, or have ever had a blood clot.  Getting evaluated for sleep disorders (sleep apnea).  Talk to your caregiver about getting a sleep evaluation  if you snore a lot or have excessive sleepiness.  Taking medicines as directed by your caregiver.  For some people, aspirin or blood thinners (anticoagulants) are helpful in reducing the risk of forming abnormal blood clots that can lead to stroke. If you have the irregular heart rhythm of atrial fibrillation, you should be on a blood thinner unless there is a good reason you cannot take them.  Understand all your medicine instructions. SEEK IMMEDIATE MEDICAL CARE IF:   You have sudden weakness or numbness of the face, arm, or leg, especially on one side of the body.  You have sudden confusion.  You have trouble speaking (aphasia) or understanding.  You have sudden trouble seeing in one or both eyes.  You have sudden trouble walking.  You have dizziness.  You have a loss of balance or coordination.  You have a sudden, severe headache with no known cause.  You have new chest pain or an irregular heartbeat. Any of these symptoms may represent a serious problem that is an emergency. Do not wait to see if the symptoms will go away. Get medical help right away. Call your local emergency services (911 in [place] Do not drive yourself to the hospital. Document Released: 04/04/2004 Document Revised: 05/20/2011 Document Reviewed: 08/28/2012 [employer] Patient Information 2014 [employer]   Peripheral Vascular Disease Peripheral Vascular Disease (PVD), also called Peripheral Arterial Disease (PAD), is a circulation problem caused by cholesterol (atherosclerotic plaque) deposits in the arteries. PVD commonly occurs in the lower extremities (legs) but it can occur in other areas of the body, such as your arms. The cholesterol buildup in the arteries reduces blood flow which can cause pain and other serious problems. The presence   of PVD can place a person at risk for Coronary Artery Disease (CAD).  CAUSES  Causes of PVD can be many. It is usually associated with more than one risk factor such  as:   High Cholesterol.  Smoking.  Diabetes.  Lack of exercise or inactivity.  High blood pressure (hypertension).  Obesity.  Family history. SYMPTOMS   When the lower extremities are affected, patients with PVD may experience:  Leg pain with exertion or physical activity. This is called INTERMITTENT CLAUDICATION. This may present as cramping or numbness with physical activity. The location of the pain is associated with the level of blockage. For example, blockage at the abdominal level (distal abdominal aorta) may result in buttock or hip pain. Lower leg arterial blockage may result in calf pain.  As PVD becomes more severe, pain can develop with less physical activity.  In people with severe PVD, leg pain may occur at rest.  Other PVD signs and symptoms:  Leg numbness or weakness.  Coldness in the affected leg or foot, especially when compared to the other leg.  A change in leg color.  Patients with significant PVD are more prone to ulcers or sores on toes, feet or legs. These may take longer to heal or may reoccur. The ulcers or sores can become infected.  If signs and symptoms of PVD are ignored, gangrene may occur. This can result in the loss of toes or loss of an entire limb.  Not all leg pain is related to PVD. Other medical conditions can cause leg pain such as:  Blood clots (embolism) or Deep Vein Thrombosis.  Inflammation of the blood vessels (vasculitis).  Spinal stenosis. DIAGNOSIS  Diagnosis of PVD can involve several different types of tests. These can include:  Pulse Volume Recording Method (PVR). This test is simple, painless and does not involve the use of X-rays. PVR involves measuring and comparing the blood pressure in the arms and legs. An ABI (Ankle-Brachial Index) is calculated. The normal ratio of blood pressures is 1. As this number becomes smaller, it indicates more severe disease.  < 0.95  indicates significant narrowing in one or more leg  vessels.  <0.8 there will usually be pain in the foot, leg or buttock with exercise.  <0.4 will usually have pain in the legs at rest.  <0.25  usually indicates limb threatening PVD.  Doppler detection of pulses in the legs. This test is painless and checks to see if you have a pulses in your legs/feet.  A dye or contrast material (a substance that highlights the blood vessels so they show up on x-ray) may be given to help your caregiver better see the arteries for the following tests. The dye is eliminated from your body by the kidney's. Your caregiver may order blood work to check your kidney function and other laboratory values before the following tests are performed:  Magnetic Resonance Angiography (MRA). An MRA is a picture study of the blood vessels and arteries. The MRA machine uses a large magnet to produce images of the blood vessels.  Computed Tomography Angiography (CTA). A CTA is a specialized x-ray that looks at how the blood flows in your blood vessels. An IV may be inserted into your arm so contrast dye can be injected.  Angiogram. Is a procedure that uses x-rays to look at your blood vessels. This procedure is minimally invasive, meaning a small incision (cut) is made in your groin. A small tube (catheter) is then inserted into the artery   of your groin. The catheter is guided to the blood vessel or artery your caregiver wants to examine. Contrast dye is injected into the catheter. X-rays are then taken of the blood vessel or artery. After the images are obtained, the catheter is taken out. TREATMENT  Treatment of PVD involves many interventions which may include:  Lifestyle changes:  Quitting smoking.  Exercise.  Following a low fat, low cholesterol diet.  Control of diabetes.  Foot care is very important to the PVD patient. Good foot care can help prevent infection.  Medication:  Cholesterol-lowering medicine.  Blood pressure medicine.  Anti-platelet  drugs.  Certain medicines may reduce symptoms of Intermittent Claudication.  Interventional/Surgical options:  Angioplasty. An Angioplasty is a procedure that inflates a balloon in the blocked artery. This opens the blocked artery to improve blood flow.  Stent Implant. A wire mesh tube (stent) is placed in the artery. The stent expands and stays in place, allowing the artery to remain open.  Peripheral Bypass Surgery. This is a surgical procedure that reroutes the blood around a blocked artery to help improve blood flow. This type of procedure may be performed if Angioplasty or stent implants are not an option. SEEK IMMEDIATE MEDICAL CARE IF:   You develop pain or numbness in your arms or legs.  Your arm or leg turns cold, becomes blue in color.  You develop redness, warmth, swelling and pain in your arms or legs. MAKE SURE YOU:   Understand these instructions.  Will watch your condition.  Will get help right away if you are not doing well or get worse. Document Released: 04/04/2004 Document Revised: 05/20/2011 Document Reviewed: 03/01/2008 [employer] Patient Information 2014 [employer].  

## 2013-03-09 NOTE — Progress Notes (Signed)
[facility] [place] HISTORY AND PHYSICAL   MRN : [id number]  History of Present Illness:   Debra Hopkins is a 72 y.o. female patient of Dr. Hart Rochester who status post left CEA in September 2008, left SFA PTA with stent 2003. She returns today for surveillance. Cramping in both calves equally after walking about 1/4 mile, no change in claudication symptoms in the last year; denies non-healing wounds, denies rest pain.  States her last TIA was about 20 years ago as manifested by feeling dizzy and nauseated for about 15 minutes; she was then told by a medical provider that she had TIA's. She did not have hemiplegia, monocular blindness, facial drooping, or aphasia. Her DM is in good control, she stopped smoking 28 years ago, she takes a daily ASA and a statin, she is obese, and she walks 30 minutes daily at least 5 days/week. She rarely drinks ETOH.     Current Outpatient Prescriptions  Medication Sig Dispense Refill  . AFEDITAB CR 30 MG 24 hr tablet 30 mg daily.       Marland Kitchen ALPRAZolam (XANAX) 0.5 MG tablet Take 0.5 mg by mouth once.      Marland Kitchen aspirin (ECOTRIN) 325 MG EC tablet Take 325 mg by mouth daily. Patient states that she alternates taking the 325 mg and the 81 mg... She takes them every other day      . beta carotene w/minerals (OCUVITE) tablet Take 1 tablet by mouth daily.        . Biotin 1000 MCG tablet Take 1,000 mcg by mouth daily.        . calcium carbonate (TUMS - DOSED IN MG ELEMENTAL CALCIUM) 500 MG chewable tablet Chew 1 tablet by mouth as needed.        . cholecalciferol (VITAMIN D) 1000 UNITS tablet Take 2,000 Units by mouth daily.       . cloNIDine (CATAPRES) 0.1 MG tablet Take 0.1 mg by mouth 2 (two) times daily.       . cloNIDine (CATAPRES) 0.1 MG tablet Take 0.1 mg by mouth 3 (three) times daily.      Marland Kitchen co-enzyme Q-10 30 MG capsule Take 100 mg by mouth daily.      Marland Kitchen esomeprazole (NEXIUM) 40 MG capsule Take 40 mg by mouth daily before breakfast.      . folic  acid (FOLVITE) 800 MCG tablet Take 400 mcg by mouth daily.        . Glucosamine-Chondroitin (OSTEO BI-FLEX REGULAR STRENGTH PO) Take 1 tablet by mouth daily.        . hydrALAZINE (APRESOLINE) 25 MG tablet Take 25 mg by mouth daily.      Marland Kitchen HYDROcodone-acetaminophen (NORCO/VICODIN) 5-325 MG per tablet Take 1 tablet by mouth every 6 (six) hours as needed for pain.  10 tablet  0  . levothyroxine (SYNTHROID, LEVOTHROID) 25 MCG tablet Take 25 mcg by mouth daily before breakfast.      . losartan (COZAAR) 100 MG tablet Take 100 mg by mouth daily.        . metFORMIN (GLUCOPHAGE) 500 MG tablet Take 500 mg by mouth 2 (two) times daily with a meal.        . methylcellulose (CITRUCEL) oral powder Take by mouth. 1 TBSP Twice Weekly       . Multiple Minerals-Vitamins (CALCIUM CITRATE PLUS PO) Take 3 tablets by mouth daily. 1000mg  of calcium with 400units of vitamin D and 500mg  of magnesium.      Marland Kitchen  Multiple Vitamins-Minerals (MULTIVITAL) tablet Take 3 tablets by mouth daily.       . Multiple Vitamins-Minerals (PRESERVISION AREDS 2 PO) Take 2 tablets by mouth daily.      . Omega-3 Fatty Acids (FISH OIL) 1000 MG CAPS Take 1,100 mg by mouth daily.       Marland Kitchen oxyCODONE-acetaminophen (PERCOCET) 5-325 MG per tablet Take 1 tablet by mouth every 4 (four) hours as needed for pain.       . simvastatin (ZOCOR) 20 MG tablet Take 20 mg by mouth at bedtime.       . torsemide (DEMADEX) 20 MG tablet Take 20-40 mg by mouth daily. Take 1-2 tabs      . traZODone (DESYREL) 50 MG tablet Take 100 mg by mouth at bedtime.        No current facility-administered medications for this visit.     Past Medical History  Diagnosis Date  . Unspecified essential hypertension   . Other and unspecified hyperlipidemia   . PVD (peripheral vascular disease)   . Dyspnea   . GERD (gastroesophageal reflux disease)   . Arthritis   . Diabetes mellitus     Type 2  . Fibromyalgia   . TIA (transient ischemic attack)   . Cataracts, bilateral   .  Varicose veins   . Impaired hearing   . History of shingles   . CAD (coronary artery disease)   . Hemorrhoids   . Diverticulitis   . DDD (degenerative disc disease)   . Macular degeneration   . Carotid artery occlusion     Past Surgical History  Procedure Laterality Date  . Breast biopsy    . Kidney stone surgery    . Aspiration of a cyst in the right breast    . Total abdominal hysterectomy    . Appendectomy    . Cardiac catheterization    . Spine surgery  1997    C5-c6 fusion  . Carotid endarterectomy  2008    left CEA  . Knee arthroscopy  2009  . Joint replacement  2011    Right total knee replacement    Social History History  Substance Use Topics  . Smoking status: Former Smoker    Types: Cigarettes    Quit date: 03/11/1985  . Smokeless tobacco: Never Used  . Alcohol Use: No    Family History Family History  Problem Relation Age of Onset  . Coronary artery disease Other   . Stroke Mother   . Hypertension Mother   . Stroke Father   . Hypertension Father   . Diabetes Father   . Coronary artery disease Father   . Diabetes Sister   . Hypertension Sister   . Lung cancer Brother   . Sinusitis Brother   . Healthy Brother   . Diabetes Son   . Hypertension Son    Allergies  Allergen Reactions  . Amoxicillin-Pot Clavulanate     REACTION: hives  . Amoxicillin-Pot Clavulanate Hives  . Ciprofloxacin Nausea Only    Dizziness  . Erythromycin     REACTION: Upset stomach  . Ibuprofen     REACTION: nausea  . Naproxen     REACTION: severe nausea  . Prednisone     REACTION: itchy rash  . Sulfamethoxazole-Trimethoprim     REACTION: Upset stomach     REVIEW OF SYSTEMS: See HPI for pertinent positives and negatives.  Physical Examination Filed Vitals:   03/09/13 1146 03/09/13 1150  BP: 135/73 123/75  Pulse: 66 63  Resp:  16  Height:  5\' 3"  (1.6 m)  Weight:  188 lb (85.276 kg)  SpO2:  99%   Body mass index is 33.31 kg/(m^2).  General:  Obese  female in NAD Gait: Normal HENT: WNL Eyes: Pupils equal Pulmonary: normal non-labored breathing , without Rales, rhonchi,  wheezing Cardiac: RRR, with  Murmur  Abdomen: soft, NT, no masses Skin: no rashes, ulcers noted;  no Gangrene , no cellulitis; no open wounds;   Vascular Exam/Pulses: VASCULAR EXAM  Carotid Bruits Left Right    transmitted cardiac murmur Positive                             VASCULAR EXAM: Extremities without ischemic changes, 1+ bilateral pitting pretibial edema without Gangrene; without open wounds.                                                                                                          LE Pulses LEFT RIGHT       FEMORAL  2+ palpable  2+ palpable        POPLITEAL  not palpable   not palpable       POSTERIOR TIBIAL  not palpable   not palpable        DORSALIS PEDIS      ANTERIOR TIBIAL not palpable  not palpable      Musculoskeletal: no muscle wasting or atrophy; no edema  Neurologic: A&O X 3; Appropriate Affect ;  SENSATION: normal; MOTOR FUNCTION: 4/5 Symmetric, CN 2-12 intact Speech is fluent/normal   Significant Diagnostic Studies:   Non-Invasive Vascular Imaging (03/09/2013): Carotid Duplex: Right ICA: <40% stenosis Left ICA: patent CEA site  Significant stenosis of right ECA. Bilateral vertebral artery is antegrade. No significant change from previous exam.  ABI's: Right: 0.79 with monophasic waveforms; Left: 0.95 with triphasic and monophasic waveforms Previous (02/25/12): Right: 0.77; Left: 0.63.   ASSESSMENT: Debra Hopkins is a 72 y.o. female patient who status post left CEA in September 2008, left SFA PTA with stent 2003. She has had no TIA's in 20 years. Her carotid Duplex today indicates minimal right ICA stenosis and patent left ICA which is the CEA site. Her left ABI has improved to normal, her right ABI remains at moderate arterial occlusive disease. Her claudication is more severe than the ABI's  indicate as the measure of atherosclerosis; she has bilateral calf pain after walking about 1/4 mile.   PLAN:   Based on today's study results and exam, patient advised to return in 1 year for ABI's and bilateral carotid Duplex. I discussed in depth with the patient the nature of atherosclerosis, and emphasized the importance of maximal medical management including strict control of blood pressure, blood glucose, and lipid levels, obtaining regular exercise, and continued cessation of smoking.  The patient is aware that without maximal medical management the underlying atherosclerotic disease process will progress, limiting the benefit of any interventions.  The patient was given information about stroke prevention and what symptoms should prompt  the patient to seek immediate medical care. . The patient was given information about PAD including signs, symptoms, treatment, what symptoms should prompt the patient to seek immediate medical care, and risk reduction measures to take.  Charisse March, RN, MSN, FNP-C [facility] Office: [phone]  Clinic MD: Hart Rochester 03/09/2013 11:55 AM

## 2013-09-27 ENCOUNTER — Encounter (INDEPENDENT_AMBULATORY_CARE_PROVIDER_SITE_OTHER): Payer: Self-pay | Admitting: *Deleted

## 2013-09-27 ENCOUNTER — Other Ambulatory Visit (INDEPENDENT_AMBULATORY_CARE_PROVIDER_SITE_OTHER): Payer: Self-pay | Admitting: *Deleted

## 2013-09-27 ENCOUNTER — Ambulatory Visit (INDEPENDENT_AMBULATORY_CARE_PROVIDER_SITE_OTHER): Payer: Medicare Other | Admitting: Internal Medicine

## 2013-09-27 ENCOUNTER — Encounter (INDEPENDENT_AMBULATORY_CARE_PROVIDER_SITE_OTHER): Payer: Self-pay | Admitting: Internal Medicine

## 2013-09-27 VITALS — BP 126/64 | HR 60 | Temp 98.1°F | Ht 63.0 in | Wt 199.6 lb

## 2013-09-27 DIAGNOSIS — R1310 Dysphagia, unspecified: Secondary | ICD-10-CM

## 2013-09-27 DIAGNOSIS — IMO0001 Reserved for inherently not codable concepts without codable children: Secondary | ICD-10-CM

## 2013-09-27 DIAGNOSIS — E039 Hypothyroidism, unspecified: Secondary | ICD-10-CM | POA: Insufficient documentation

## 2013-09-27 DIAGNOSIS — M797 Fibromyalgia: Secondary | ICD-10-CM | POA: Insufficient documentation

## 2013-09-27 NOTE — Patient Instructions (Signed)
EGD/ED. The risks and benefits such as perforation, bleeding, and infection were reviewed with the patient and is agreeable. 

## 2013-09-27 NOTE — Progress Notes (Signed)
Subjective:     Patient ID: Debra Hopkins, female   DOB: 1940/07/30, 73 y.o.   MRN: [id number]  HPI Here today with c/o of dysphagia. She tells me she can feel all her pills go down when she swallows. She has pain when she swallows her pills. Sometimes she will choke on water.  When she eats her food, the first bite makes her cough. She does ot have any problems swallowing red meats. Hx of dysphagia and underwent an EGD/ED in the past.  She does have acid reflux and takes Nexium for this.  Appetite is good. No unintentional weight loss. No abdominal pain.  She usually has a BM one a day. No melena or bright red rectal bleeding.  She tells me she has constipation.     01/03/2003 EGD/ED: dysphagia: IMPRESSION:  ESOPHAGOGASTRODUODENOSCOPY:  1. Noncritical-appearing Schatzki's ring, otherwise, normal esophagus,  status post dilation, as described above.  2. Normal stomach, normal first and second duodenum.   Review of Systems Past Medical History  Diagnosis Date  . Unspecified essential hypertension   . Other and unspecified hyperlipidemia   . PVD (peripheral vascular disease)   . Dyspnea   . GERD (gastroesophageal reflux disease)   . Arthritis   . Diabetes mellitus     Type 2  . Fibromyalgia   . TIA (transient ischemic attack)   . Cataracts, bilateral   . Varicose veins   . Impaired hearing   . History of shingles   . CAD (coronary artery disease)   . Hemorrhoids   . Diverticulitis   . DDD (degenerative disc disease)   . Macular degeneration   . Carotid artery occlusion     Past Surgical History  Procedure Laterality Date  . Breast biopsy    . Kidney stone surgery  1988  . Aspiration of a cyst in the right breast    . Appendectomy    . Cardiac catheterization    . Spine surgery  1997    C5-c6 fusion  . Knee arthroscopy  2009  . Joint replacement  2011    Right total knee replacement  . Total abdominal hysterectomy  1973  . Carotid endarterectomy  12-03-06    left  CEA    Allergies  Allergen Reactions  . Amoxicillin-Pot Clavulanate     REACTION: hives  . Amoxicillin-Pot Clavulanate Hives  . Ciprofloxacin Nausea Only    Dizziness  . Erythromycin     REACTION: Upset stomach  . Motrin [Ibuprofen]     REACTION: nausea  . Naproxen     REACTION: severe nausea  . Prednisone     REACTION: itchy rash  . Sulfamethoxazole-Trimethoprim     REACTION: Upset stomach    Current Outpatient Prescriptions on File Prior to Visit  Medication Sig Dispense Refill  . AFEDITAB CR 30 MG 24 hr tablet 30 mg daily.       . Biotin 1000 MCG tablet Take 1,000 mcg by mouth daily.        . calcium carbonate (TUMS - DOSED IN MG ELEMENTAL CALCIUM) 500 MG chewable tablet Chew 1 tablet by mouth as needed.        . cloNIDine (CATAPRES) 0.1 MG tablet Take 0.1 mg by mouth 3 (three) times daily.      Marland Kitchen co-enzyme Q-10 30 MG capsule Take 100 mg by mouth daily.      Marland Kitchen esomeprazole (NEXIUM) 40 MG capsule Take 40 mg by mouth daily before breakfast.      .  folic acid (FOLVITE) 161 MCG tablet Take 400 mcg by mouth daily.        . Glucosamine-Chondroitin (OSTEO BI-FLEX REGULAR STRENGTH PO) Take 1 tablet by mouth daily.        . hydrALAZINE (APRESOLINE) 25 MG tablet Take 25 mg by mouth daily.      Marland Kitchen HYDROcodone-acetaminophen (NORCO/VICODIN) 5-325 MG per tablet Take 1 tablet by mouth every 6 (six) hours as needed for pain.  10 tablet  0  . levothyroxine (SYNTHROID, LEVOTHROID) 25 MCG tablet Take 50 mcg by mouth daily before breakfast.       . losartan (COZAAR) 100 MG tablet Take 100 mg by mouth daily.        . metFORMIN (GLUCOPHAGE) 500 MG tablet Take 500 mg by mouth 2 (two) times daily with a meal.        . methylcellulose (CITRUCEL) oral powder Take by mouth. 1 TBSP Twice Weekly       . Multiple Minerals-Vitamins (CALCIUM CITRATE PLUS PO) Take 3 tablets by mouth daily. 1000mg  of calcium with 400units of vitamin D and 500mg  of magnesium.      . Multiple Vitamins-Minerals (MULTIVITAL)  tablet Take 3 tablets by mouth daily.       . Multiple Vitamins-Minerals (PRESERVISION AREDS 2 PO) Take 2 tablets by mouth daily.      . Omega-3 Fatty Acids (FISH OIL) 1000 MG CAPS Take 1,100 mg by mouth daily.       Marland Kitchen oxyCODONE-acetaminophen (PERCOCET) 5-325 MG per tablet Take 1 tablet by mouth every 4 (four) hours as needed for pain.       . simvastatin (ZOCOR) 20 MG tablet Take 20 mg by mouth at bedtime.       . torsemide (DEMADEX) 20 MG tablet Take 20-40 mg by mouth daily. Take 1-2 tabs      . traZODone (DESYREL) 50 MG tablet Take 100 mg by mouth at bedtime.       . cloNIDine (CATAPRES) 0.1 MG tablet Take 0.1 mg by mouth 2 (two) times daily.        No current facility-administered medications on file prior to visit.        Objective:   Physical Exam  Filed Vitals:   09/27/13 1455  BP: 126/64  Pulse: 60  Temp: 98.1 F (36.7 C)  Height: 5\' 3"  (1.6 m)  Weight: 199 lb 9.6 oz (90.538 kg)   Alert and oriented. Skin warm and dry. Oral mucosa is moist.   . Sclera anicteric, conjunctivae is pink. Thyroid not enlarged. No cervical lymphadenopathy. Lungs clear. Heart regular rate and rhythm.  Abdomen is soft. Bowel sounds are positive. No hepatomegaly. No abdominal masses felt. No tenderness.  No edema to lower extremities. Patient is alert and oriented.Alert and oriented. Skin warm and dry. Oral mucosa is moist.   . Sclera anicteric, conjunctivae is pink. Thyroid not enlarged. No cervical lymphadenopathy. Lungs clear. Heart regular rate and rhythm.  Abdomen is soft. Bowel sounds are positive. No hepatomegaly. No abdominal masses felt. No tenderness.  No edema to lower extremities.       Assessment:    Solid food dysphagia and pill dysphagia. Hx of Schatzki ring.     Plan:     EGD/Ed.   The risks and benefits such as perforation, bleeding, and infection were reviewed with the patient and is agreeable.

## 2013-09-28 ENCOUNTER — Encounter (HOSPITAL_COMMUNITY): Payer: Self-pay | Admitting: Pharmacy Technician

## 2013-10-01 ENCOUNTER — Ambulatory Visit (HOSPITAL_COMMUNITY)
Admission: RE | Admit: 2013-10-01 | Discharge: 2013-10-01 | Disposition: A | Discharge status: Home / Self Care | Payer: Medicare Other | Source: Ambulatory Visit | Attending: Internal Medicine | Admitting: Internal Medicine

## 2013-10-01 ENCOUNTER — Encounter (HOSPITAL_COMMUNITY)
Admission: RE | Disposition: A | Discharge status: Home / Self Care | Payer: Self-pay | Source: Ambulatory Visit | Attending: Internal Medicine

## 2013-10-01 DIAGNOSIS — I2510 Atherosclerotic heart disease of native coronary artery without angina pectoris: Secondary | ICD-10-CM | POA: Insufficient documentation

## 2013-10-01 DIAGNOSIS — E785 Hyperlipidemia, unspecified: Secondary | ICD-10-CM | POA: Diagnosis not present

## 2013-10-01 DIAGNOSIS — K222 Esophageal obstruction: Secondary | ICD-10-CM | POA: Diagnosis not present

## 2013-10-01 DIAGNOSIS — Z87891 Personal history of nicotine dependence: Secondary | ICD-10-CM | POA: Diagnosis not present

## 2013-10-01 DIAGNOSIS — K219 Gastro-esophageal reflux disease without esophagitis: Secondary | ICD-10-CM | POA: Diagnosis present

## 2013-10-01 DIAGNOSIS — I10 Essential (primary) hypertension: Secondary | ICD-10-CM | POA: Insufficient documentation

## 2013-10-01 DIAGNOSIS — K449 Diaphragmatic hernia without obstruction or gangrene: Secondary | ICD-10-CM | POA: Diagnosis not present

## 2013-10-01 DIAGNOSIS — Z79899 Other long term (current) drug therapy: Secondary | ICD-10-CM | POA: Insufficient documentation

## 2013-10-01 DIAGNOSIS — E119 Type 2 diabetes mellitus without complications: Secondary | ICD-10-CM | POA: Diagnosis not present

## 2013-10-01 DIAGNOSIS — R1310 Dysphagia, unspecified: Secondary | ICD-10-CM

## 2013-10-01 HISTORY — PX: MALONEY DILATION: SHX5535

## 2013-10-01 HISTORY — PX: ESOPHAGOGASTRODUODENOSCOPY: SHX5428

## 2013-10-01 HISTORY — PX: SAVORY DILATION: SHX5439

## 2013-10-01 HISTORY — PX: BALLOON DILATION: SHX5330

## 2013-10-01 LAB — GLUCOSE, CAPILLARY: Glucose-Capillary: 121 mg/dL — ABNORMAL HIGH (ref 70–99)

## 2013-10-01 SURGERY — EGD (ESOPHAGOGASTRODUODENOSCOPY)
Anesthesia: Moderate Sedation

## 2013-10-01 MED ORDER — STERILE WATER FOR IRRIGATION IR SOLN
Status: DC | PRN
  Administered 2013-10-01: 14:00:00

## 2013-10-01 MED ORDER — MEPERIDINE HCL 50 MG/ML IJ SOLN
INTRAMUSCULAR | Status: AC
  Filled 2013-10-01: qty 1

## 2013-10-01 MED ORDER — BUTAMBEN-TETRACAINE-BENZOCAINE 2-2-14 % EX AERO
INHALATION_SPRAY | CUTANEOUS | Status: DC | PRN
  Administered 2013-10-01: 2 via TOPICAL

## 2013-10-01 MED ORDER — METOCLOPRAMIDE HCL 10 MG PO TABS: 10.0000 mg | ORAL_TABLET | Freq: Three times a day (TID) | ORAL | Status: DC

## 2013-10-01 MED ORDER — SODIUM CHLORIDE 0.9 % IV SOLN
INTRAVENOUS | Status: DC
  Administered 2013-10-01: 12:00:00 via INTRAVENOUS

## 2013-10-01 MED ORDER — MIDAZOLAM HCL 5 MG/5ML IJ SOLN
INTRAMUSCULAR | Status: DC | PRN
  Administered 2013-10-01 (×2): 1 mg via INTRAVENOUS
  Administered 2013-10-01: 2 mg via INTRAVENOUS
  Administered 2013-10-01 (×2): 1 mg via INTRAVENOUS
  Administered 2013-10-01 (×2): 2 mg via INTRAVENOUS

## 2013-10-01 MED ORDER — MEPERIDINE HCL 50 MG/ML IJ SOLN
INTRAMUSCULAR | Status: DC | PRN
  Administered 2013-10-01 (×2): 25 mg via INTRAVENOUS

## 2013-10-01 MED ORDER — MIDAZOLAM HCL 5 MG/5ML IJ SOLN
INTRAMUSCULAR | Status: AC
  Filled 2013-10-01: qty 10

## 2013-10-01 NOTE — Op Note (Signed)
EGD PROCEDURE REPORT  PATIENT:  Debra Hopkins  MR#:  [id number] Birthdate:  01-20-1941, 73 y.o., female Endoscopist:  Dr. Rogene Houston, MD Referred By:  Dr. Monico Blitz, MD Procedure Date: 10/01/2013  Procedure:   EGD with ED.  Indications:  Patient is 73 year old Caucasian female with chronic GERD and now presents with a four-month history of dysphagia. She also has cough that would not go away.            Informed Consent:  The risks, benefits, alternatives & imponderables which include, but are not limited to, bleeding, infection, perforation, drug reaction and potential missed lesion have been reviewed.  The potential for biopsy, lesion removal, esophageal dilation, etc. have also been discussed.  Questions have been answered.  All parties agreeable.  Please see history & physical in medical record for more information.  Medications:  Demerol 50 mg IV Versed 10 mg IV Cetacaine spray topically for oropharyngeal anesthesia  Description of procedure:  The endoscope was introduced through the mouth and advanced to the second portion of the duodenum without difficulty or limitations. The mucosal surfaces were surveyed very carefully during advancement of the scope and upon withdrawal.  Findings:  Hypopharynx; Prominent inter arytenoid fold. Esophagus:  Normal mucosa of the esophagus. Noncritical Schatzki's ring. GEJ:  39 cm Hiatus:  39 cm Stomach:  Stomach was empty and distended very well with insufflation. Folds in the proximal stomach were normal. Examination of mucosa at gastric body, antrum, pyloric channel, angularis, fundus and cardia was normal. Duodenum:  Normal bulbar and post bulbar mucosa.  Therapeutic/Diagnostic Maneuvers Performed:   Esophagus dilated by passing 66 French Maloney dilator to full insertion. Endoscope was passed again. Ring was noted to be intact and therefore disrupted with focal biopsy at 3 sites but no tissue was saved.   Complications:   None  Impression: Prominent inter arytenoid fold. Small sliding hiatal hernia with noncritical Schatzki's ring. Esophagus dilated by passing 37 French Maloney dilator ring was intact and disrupted with focal biopsy three sites  Recommendations:  Continue anti-reflux measures and Nexium as before. Short-term therapy with metoclopramide 10 mg 3 times a day if she is able to tolerate. If cough does not improve with aggressive therapy for GERD, she will need ENT evaluation.  REHMAN,NAJEEB U  10/01/2013  2:00 PM  CC: Dr. Monico Blitz, MD & Dr. Rayne Du ref. provider found

## 2013-10-01 NOTE — H&P (Addendum)
Debra Hopkins is an 73 y.o. female.   Chief Complaint: Patient is here for EGD and ED. HPI: Patient is 73 year old Caucasian female with chronic GERD maintenance PPI who presents with 3-4 month history of dysphagia primarily to solids she to liquids. He points to make certain that he is at a bolus obstruction and would eventually passes after a few seconds a few minutes. She has breakthrough heartburn once or twice a week. She denies nausea vomiting abdominal pain melena or rectal bleeding. He has history of Schatzki's ring and last dilation was in October 2004. She says she said the coughing spells since last winter and she got better for a while. She also reports  left chest pain where she is tender to touch. She states this pain started last night when she was coughing.  Past Medical History  Diagnosis Date  . Unspecified essential hypertension   . Other and unspecified hyperlipidemia   . PVD (peripheral vascular disease)   . Dyspnea   . GERD (gastroesophageal reflux disease)   . Arthritis   . Diabetes mellitus     Type 2  . Fibromyalgia   . TIA (transient ischemic attack)   . Cataracts, bilateral   . Varicose veins   . Impaired hearing   . History of shingles   . CAD (coronary artery disease)   . Hemorrhoids   . Diverticulitis   . DDD (degenerative disc disease)   . Macular degeneration   . Carotid artery occlusion     Past Surgical History  Procedure Laterality Date  . Breast biopsy    . Kidney stone surgery  1988  . Aspiration of a cyst in the right breast    . Appendectomy    . Cardiac catheterization    . Spine surgery  1997    C5-c6 fusion  . Knee arthroscopy  2009  . Joint replacement  2011    Right total knee replacement  . Total abdominal hysterectomy  1973  . Carotid endarterectomy  12-03-06    left CEA  . Angioplasty      left leg 2003    Family History  Problem Relation Age of Onset  . Coronary artery disease Other   . Stroke Mother   . Hypertension  Mother   . Stroke Father   . Hypertension Father   . Diabetes Father   . Coronary artery disease Father   . Diabetes Sister   . Hypertension Sister   . Lung cancer Brother   . Sinusitis Brother   . Healthy Brother   . Diabetes Son   . Hypertension Son    Social History:  reports that she quit smoking about 28 years ago. Her smoking use included Cigarettes. She smoked 0.00 packs per day. She has never used smokeless tobacco. She reports that she does not drink alcohol or use illicit drugs.  Allergies:  Allergies  Allergen Reactions  . Amoxicillin-Pot Clavulanate     REACTION: hives  . Amoxicillin-Pot Clavulanate Hives  . Ciprofloxacin Nausea Only    Dizziness  . Erythromycin     REACTION: Upset stomach  . Motrin [Ibuprofen]     REACTION: nausea  . Naproxen     REACTION: severe nausea  . Prednisone     REACTION: itchy rash  . Sulfamethoxazole-Trimethoprim     REACTION: Upset stomach    Medications Prior to Admission  Medication Sig Dispense Refill  . AFEDITAB CR 30 MG 24 hr tablet 30 mg daily.       Marland Kitchen  Biotin 1000 MCG tablet Take 1,000 mcg by mouth daily.        . calcium carbonate (TUMS - DOSED IN MG ELEMENTAL CALCIUM) 500 MG chewable tablet Chew 1 tablet by mouth daily.       . clobetasol cream (TEMOVATE) 0.86 % Apply 1 application topically 2 (two) times daily.      . cloNIDine (CATAPRES) 0.1 MG tablet Take 0.1 mg by mouth 3 (three) times daily.      Marland Kitchen co-enzyme Q-10 30 MG capsule Take 100 mg by mouth daily.      Marland Kitchen esomeprazole (NEXIUM) 40 MG capsule Take 40 mg by mouth daily before breakfast.      . folic acid (FOLVITE) 578 MCG tablet Take 800 mcg by mouth daily.       . Glucosamine-Chondroitin (OSTEO BI-FLEX REGULAR STRENGTH PO) Take 1 tablet by mouth daily.        . hydrALAZINE (APRESOLINE) 25 MG tablet Take 25 mg by mouth daily.      Marland Kitchen levothyroxine (SYNTHROID, LEVOTHROID) 25 MCG tablet Take 50 mcg by mouth daily before breakfast.       . losartan (COZAAR) 100 MG  tablet Take 100 mg by mouth daily.        . metFORMIN (GLUMETZA) 500 MG (MOD) 24 hr tablet Take 500 mg by mouth 2 (two) times daily with a meal.      . methylcellulose (CITRUCEL) oral powder Take by mouth. 1 TBSP Twice Weekly       . Multiple Minerals-Vitamins (CALCIUM CITRATE PLUS PO) Take 3 tablets by mouth daily. 1000mg  of calcium with 400units of vitamin D and 500mg  of magnesium.      . Multiple Vitamins-Minerals (MULTIVITAL) tablet Take 3 tablets by mouth daily.       . Multiple Vitamins-Minerals (PRESERVISION AREDS 2 PO) Take 2 tablets by mouth daily.      . Omega-3 Fatty Acids (FISH OIL) 1000 MG CAPS Take 1,100 mg by mouth daily.       . simvastatin (ZOCOR) 20 MG tablet Take 20 mg by mouth at bedtime.       . torsemide (DEMADEX) 20 MG tablet Take 10-20 mg by mouth 2 (two) times daily. 20 mg in the morning and 10 mg at lunch.      . traZODone (DESYREL) 50 MG tablet Take 100 mg by mouth at bedtime.       Marland Kitchen oxyCODONE-acetaminophen (PERCOCET) 5-325 MG per tablet Take 1 tablet by mouth every 4 (four) hours as needed for pain.         Results for orders placed during the hospital encounter of 10/01/13 (from the past 48 hour(s))  GLUCOSE, CAPILLARY     Status: Abnormal   Collection Time    10/01/13 12:07 PM      Result Value Ref Range   Glucose-Capillary 121 (*) 70 - 99 mg/dL   No results found.  ROS  Blood pressure 134/58, pulse 65, temperature 97.7 F (36.5 C), temperature source Oral, resp. rate 12, SpO2 95.00%. Physical Exam  Constitutional: She appears well-developed and well-nourished.  HENT:  Mouth/Throat: Oropharynx is clear and moist.  Eyes: Conjunctivae are normal. No scleral icterus.  Neck: No thyromegaly present.  Cardiovascular: Normal rate, regular rhythm and normal heart sounds.   No murmur heard. Chest wall tenderness over left third costochondral region  Respiratory: Effort normal and breath sounds normal.  GI: Soft. She exhibits no distension and no mass. There  is no tenderness.  Musculoskeletal: She exhibits  no edema.  Lymphadenopathy:    She has no cervical adenopathy.  Neurological: She is alert.  Skin: Skin is warm and dry.     Assessment/Plan Dysphagia primarily to solids. Chronic GERD. EGD with  ED.  REHMAN,NAJEEB U 10/01/2013, 1:23 PM

## 2013-10-01 NOTE — Discharge Instructions (Signed)
Resume usual medications and diet. Metoclopramide 10 mg by mouth 30 minutes before each meal daily. Stop this medication if you experience side effects and call office. No driving for 24 hours. Progress report in 2 weeks.

## 2013-10-04 ENCOUNTER — Encounter (HOSPITAL_COMMUNITY): Payer: Self-pay | Admitting: Internal Medicine

## 2013-10-07 NOTE — Patient Instructions (Signed)
Your procedure is scheduled on:  10/14/13  Report to [facility] at 10:00 AM.  Call this number if you have problems the morning of surgery: [phone]   Remember:   Do not eat food or drink liquids after midnight.   Take these medicines the morning of surgery with A SIP OF WATER: Afeditab CR, Clonidine, Nexium, Hydralazine, Levothyroxine and Losartan. May take your Oxycodone if needed.   Do not wear jewelry, make-up or nail polish.  Do not wear lotions, powders, or perfumes. You may wear deodorant.  Do not bring valuables to the hospital.  [facility] is not responsible for any belongings or valuables.               Contacts, dentures or bridgework may not be worn into surgery.               Patients discharged the day of surgery will not be allowed to drive home.   Special Instructions: Start using your eye drops prior to surgery as directed by your eye doctor.   Please read over the following fact sheets that you were given: Anesthesia Post-op Instructions and Care and Recovery After Surgery     Cataract Surgery  A cataract is a clouding of the lens of the eye. When a lens becomes cloudy, vision is reduced based on the degree and nature of the clouding. Surgery may be needed to improve vision. Surgery removes the cloudy lens and usually replaces it with a substitute lens (intraocular lens, IOL). LET YOUR EYE DOCTOR KNOW ABOUT:  Allergies to food or medicine.  Medicines taken including herbs, eyedrops, over-the-counter medicines, and creams.  Use of steroids (by mouth or creams).  Previous problems with anesthetics or numbing medicine.  History of bleeding problems or blood clots.  Previous surgery.  Other health problems, including diabetes and kidney problems.  Possibility of pregnancy, if this applies. RISKS AND COMPLICATIONS  Infection.  Inflammation of the eyeball (endophthalmitis) that can spread to both eyes (sympathetic ophthalmia).  Poor wound healing.  If  an IOL is inserted, it can later fall out of proper position. This is very uncommon.  Clouding of the part of your eye that holds an IOL in place. This is called an "after-cataract." These are uncommon, but easily treated. BEFORE THE PROCEDURE  Do not eat or drink anything except small amounts of water for 8 to 12 before your surgery, or as directed by your caregiver.  Unless you are told otherwise, continue any eyedrops you have been prescribed.  Talk to your primary caregiver about all other medicines that you take (both prescription and non-prescription). In some cases, you may need to stop or change medicines near the time of your surgery. This is most important if you are taking blood-thinning medicine.Do not stop medicines unless you are told to do so.  Arrange for someone to drive you to and from the procedure.  Do not put contact lenses in either eye on the day of your surgery. PROCEDURE There is more than one method for safely removing a cataract. Your doctor can explain the differences and help determine which is best for you. Phacoemulsification surgery is the most common form of cataract surgery.  An injection is given behind the eye or eyedrops are given to make this a painless procedure.  A small cut (incision) is made on the edge of the clear, dome-shaped surface that covers the front of the eye (cornea).  A tiny probe is painlessly inserted into the  eye. This device gives off ultrasound waves that soften and break up the cloudy center of the lens. This makes it easier for the cloudy lens to be removed by suction.  An IOL may be implanted.  The normal lens of the eye is covered by a clear capsule. Part of that capsule is intentionally left in the eye to support the IOL.  Your surgeon may or may not use stitches to close the incision. There are other forms of cataract surgery that require a larger incision and stiches to close the eye. This approach is taken in cases  where the doctor feels that the cataract cannot be easily removed using phacoemulsification. AFTER THE PROCEDURE  When an IOL is implanted, it does not need care. It becomes a permanent part of your eye and cannot be seen or felt.  Your doctor will schedule follow-up exams to check on your progress.  Review your other medicines with your doctor to see which can be resumed after surgery.  Use eyedrops or take medicine as prescribed by your doctor. Document Released: 02/14/2011 Document Revised: 05/20/2011 Document Reviewed: 02/14/2011 [facility] Patient Information 2013 [place].    PATIENT INSTRUCTIONS POST-ANESTHESIA  IMMEDIATELY FOLLOWING SURGERY:  Do not drive or operate machinery for the first twenty four hours after surgery.  Do not make any important decisions for twenty four hours after surgery or while taking narcotic pain medications or sedatives.  If you develop intractable nausea and vomiting or a severe headache please notify your doctor immediately.  FOLLOW-UP:  Please make an appointment with your surgeon as instructed. You do not need to follow up with anesthesia unless specifically instructed to do so.  WOUND CARE INSTRUCTIONS (if applicable):  Keep a dry clean dressing on the anesthesia/puncture wound site if there is drainage.  Once the wound has quit draining you may leave it open to air.  Generally you should leave the bandage intact for twenty four hours unless there is drainage.  If the epidural site drains for more than 36-48 hours please call the anesthesia department.  QUESTIONS?:  Please feel free to call your physician or the hospital operator if you have any questions, and they will be happy to assist you.

## 2013-10-08 ENCOUNTER — Encounter (HOSPITAL_COMMUNITY)
Admission: RE | Admit: 2013-10-08 | Discharge: 2013-10-08 | Disposition: A | Discharge status: Home / Self Care | Payer: Medicare Other | Source: Ambulatory Visit | Attending: Ophthalmology | Admitting: Ophthalmology

## 2013-10-08 ENCOUNTER — Encounter (HOSPITAL_COMMUNITY): Payer: Self-pay | Admitting: Pharmacy Technician

## 2013-10-08 ENCOUNTER — Other Ambulatory Visit (HOSPITAL_COMMUNITY): Payer: Self-pay | Admitting: Internal Medicine

## 2013-10-11 ENCOUNTER — Encounter (HOSPITAL_COMMUNITY): Payer: Self-pay | Admitting: Pharmacy Technician

## 2013-10-11 ENCOUNTER — Encounter (HOSPITAL_COMMUNITY)
Admission: RE | Admit: 2013-10-11 | Discharge: 2013-10-11 | Disposition: A | Discharge status: Home / Self Care | Payer: Medicare Other | Source: Ambulatory Visit | Attending: Ophthalmology | Admitting: Ophthalmology

## 2013-10-11 ENCOUNTER — Encounter (HOSPITAL_COMMUNITY): Payer: Self-pay

## 2013-10-11 DIAGNOSIS — E079 Disorder of thyroid, unspecified: Secondary | ICD-10-CM | POA: Diagnosis not present

## 2013-10-11 DIAGNOSIS — I10 Essential (primary) hypertension: Secondary | ICD-10-CM | POA: Diagnosis not present

## 2013-10-11 DIAGNOSIS — Z79899 Other long term (current) drug therapy: Secondary | ICD-10-CM | POA: Diagnosis not present

## 2013-10-11 DIAGNOSIS — E119 Type 2 diabetes mellitus without complications: Secondary | ICD-10-CM | POA: Diagnosis not present

## 2013-10-11 DIAGNOSIS — K219 Gastro-esophageal reflux disease without esophagitis: Secondary | ICD-10-CM | POA: Diagnosis not present

## 2013-10-11 DIAGNOSIS — H2589 Other age-related cataract: Secondary | ICD-10-CM | POA: Diagnosis not present

## 2013-10-11 DIAGNOSIS — E785 Hyperlipidemia, unspecified: Secondary | ICD-10-CM | POA: Diagnosis not present

## 2013-10-11 LAB — BASIC METABOLIC PANEL
Anion gap: 12 (ref 5–15)
BUN: 32 mg/dL — AB (ref 6–23)
CALCIUM: 9.5 mg/dL (ref 8.4–10.5)
CO2: 28 meq/L (ref 19–32)
Chloride: 99 mEq/L (ref 96–112)
Creatinine, Ser: 1.12 mg/dL — ABNORMAL HIGH (ref 0.50–1.10)
GFR calc Af Amer: 55 mL/min — ABNORMAL LOW (ref >90)
GFR calc non Af Amer: 47 mL/min — ABNORMAL LOW (ref >90)
Glucose, Bld: 114 mg/dL — ABNORMAL HIGH (ref 70–99)
Potassium: 4.9 mEq/L (ref 3.7–5.3)
SODIUM: 139 meq/L (ref 137–147)

## 2013-10-11 LAB — HEMOGLOBIN AND HEMATOCRIT, BLOOD
HCT: 29.5 % — ABNORMAL LOW (ref 36.0–46.0)
Hemoglobin: 9.9 g/dL — ABNORMAL LOW (ref 12.0–15.0)

## 2013-10-11 NOTE — Telephone Encounter (Signed)
Per NUR may fill with 1 refill. Patient will need a OV prior to any further refills.

## 2013-10-14 ENCOUNTER — Ambulatory Visit (HOSPITAL_COMMUNITY)
Admission: RE | Admit: 2013-10-14 | Discharge: 2013-10-14 | Disposition: A | Discharge status: Home / Self Care | Payer: Medicare Other | Source: Ambulatory Visit | Attending: Ophthalmology | Admitting: Ophthalmology

## 2013-10-14 ENCOUNTER — Encounter (HOSPITAL_COMMUNITY): Payer: Medicare Other | Admitting: Anesthesiology

## 2013-10-14 ENCOUNTER — Ambulatory Visit (HOSPITAL_COMMUNITY): Payer: Medicare Other | Admitting: Anesthesiology

## 2013-10-14 ENCOUNTER — Encounter (HOSPITAL_COMMUNITY): Payer: Self-pay | Admitting: *Deleted

## 2013-10-14 ENCOUNTER — Encounter (HOSPITAL_COMMUNITY)
Admission: RE | Disposition: A | Discharge status: Home / Self Care | Payer: Self-pay | Source: Ambulatory Visit | Attending: Ophthalmology

## 2013-10-14 DIAGNOSIS — Z79899 Other long term (current) drug therapy: Secondary | ICD-10-CM | POA: Insufficient documentation

## 2013-10-14 DIAGNOSIS — I10 Essential (primary) hypertension: Secondary | ICD-10-CM | POA: Diagnosis not present

## 2013-10-14 DIAGNOSIS — H2589 Other age-related cataract: Secondary | ICD-10-CM | POA: Insufficient documentation

## 2013-10-14 DIAGNOSIS — K219 Gastro-esophageal reflux disease without esophagitis: Secondary | ICD-10-CM | POA: Insufficient documentation

## 2013-10-14 DIAGNOSIS — E785 Hyperlipidemia, unspecified: Secondary | ICD-10-CM | POA: Insufficient documentation

## 2013-10-14 DIAGNOSIS — E119 Type 2 diabetes mellitus without complications: Secondary | ICD-10-CM | POA: Diagnosis not present

## 2013-10-14 DIAGNOSIS — E079 Disorder of thyroid, unspecified: Secondary | ICD-10-CM | POA: Insufficient documentation

## 2013-10-14 HISTORY — PX: CATARACT EXTRACTION W/PHACO: SHX586

## 2013-10-14 LAB — GLUCOSE, CAPILLARY: Glucose-Capillary: 132 mg/dL — ABNORMAL HIGH (ref 70–99)

## 2013-10-14 SURGERY — PHACOEMULSIFICATION, CATARACT, WITH IOL INSERTION
Anesthesia: Monitor Anesthesia Care | Site: Eye | Laterality: Right

## 2013-10-14 MED ORDER — EPINEPHRINE HCL 1 MG/ML IJ SOLN
INTRAOCULAR | Status: DC | PRN
  Administered 2013-10-14: 11:00:00

## 2013-10-14 MED ORDER — PHENYLEPHRINE HCL 2.5 % OP SOLN
1.0000 [drp] | OPHTHALMIC | Status: AC
  Administered 2013-10-14 (×3): 1 [drp] via OPHTHALMIC

## 2013-10-14 MED ORDER — CYCLOPENTOLATE-PHENYLEPHRINE 0.2-1 % OP SOLN
1.0000 [drp] | OPHTHALMIC | Status: AC
  Administered 2013-10-14 (×3): 1 [drp] via OPHTHALMIC

## 2013-10-14 MED ORDER — LIDOCAINE HCL (PF) 1 % IJ SOLN
INTRAOCULAR | Status: DC | PRN
  Administered 2013-10-14: 11:00:00 via OPHTHALMIC

## 2013-10-14 MED ORDER — MIDAZOLAM HCL 2 MG/2ML IJ SOLN
1.0000 mg | INTRAMUSCULAR | Status: DC | PRN
  Administered 2013-10-14: 2 mg via INTRAVENOUS

## 2013-10-14 MED ORDER — ONDANSETRON HCL 4 MG/2ML IJ SOLN: 4.0000 mg | Freq: Once | INTRAMUSCULAR | Status: DC | PRN

## 2013-10-14 MED ORDER — LIDOCAINE HCL 3.5 % OP GEL
1.0000 "application " | Freq: Once | OPHTHALMIC | Status: AC
  Administered 2013-10-14: 1 via OPHTHALMIC

## 2013-10-14 MED ORDER — FENTANYL CITRATE 0.05 MG/ML IJ SOLN
INTRAMUSCULAR | Status: AC
  Filled 2013-10-14: qty 2

## 2013-10-14 MED ORDER — PROVISC 10 MG/ML IO SOLN
INTRAOCULAR | Status: DC | PRN
  Administered 2013-10-14: 0.85 mL via INTRAOCULAR

## 2013-10-14 MED ORDER — GLYCOPYRROLATE 0.2 MG/ML IJ SOLN
INTRAMUSCULAR | Status: DC | PRN
  Administered 2013-10-14: 0.1 mg via INTRAVENOUS

## 2013-10-14 MED ORDER — MIDAZOLAM HCL 2 MG/2ML IJ SOLN
INTRAMUSCULAR | Status: DC | PRN
  Administered 2013-10-14: 2 mg via INTRAVENOUS

## 2013-10-14 MED ORDER — BSS IO SOLN
INTRAOCULAR | Status: DC | PRN
  Administered 2013-10-14: 15 mL via INTRAOCULAR

## 2013-10-14 MED ORDER — MIDAZOLAM HCL 2 MG/2ML IJ SOLN
INTRAMUSCULAR | Status: AC
  Filled 2013-10-14: qty 2

## 2013-10-14 MED ORDER — POVIDONE-IODINE 5 % OP SOLN
OPHTHALMIC | Status: DC | PRN
  Administered 2013-10-14: 1 via OPHTHALMIC

## 2013-10-14 MED ORDER — CYCLOPENTOLATE-PHENYLEPHRINE OP SOLN OPTIME - NO CHARGE
OPHTHALMIC | Status: DC | PRN
  Administered 2013-10-14: 2 [drp] via OPHTHALMIC

## 2013-10-14 MED ORDER — LACTATED RINGERS IV SOLN
INTRAVENOUS | Status: DC
  Administered 2013-10-14: 11:00:00 via INTRAVENOUS

## 2013-10-14 MED ORDER — GLYCOPYRROLATE 0.2 MG/ML IJ SOLN
INTRAMUSCULAR | Status: AC
  Filled 2013-10-14: qty 1

## 2013-10-14 MED ORDER — FENTANYL CITRATE 0.05 MG/ML IJ SOLN: 25.0000 ug | INTRAMUSCULAR | Status: DC | PRN

## 2013-10-14 MED ORDER — NEOMYCIN-POLYMYXIN-DEXAMETH 3.5-10000-0.1 OP SUSP
OPHTHALMIC | Status: DC | PRN
  Administered 2013-10-14: 2 [drp] via OPHTHALMIC

## 2013-10-14 MED ORDER — FENTANYL CITRATE 0.05 MG/ML IJ SOLN
25.0000 ug | INTRAMUSCULAR | Status: AC
  Administered 2013-10-14: 25 ug via INTRAVENOUS

## 2013-10-14 MED ORDER — LIDOCAINE 3.5 % OP GEL OPTIME - NO CHARGE
OPHTHALMIC | Status: DC | PRN
  Administered 2013-10-14: 2 [drp] via OPHTHALMIC

## 2013-10-14 MED ORDER — TETRACAINE HCL 0.5 % OP SOLN
1.0000 [drp] | OPHTHALMIC | Status: AC
  Administered 2013-10-14 (×3): 1 [drp] via OPHTHALMIC

## 2013-10-14 SURGICAL SUPPLY — 34 items
CAPSULAR TENSION RING-AMO (OPHTHALMIC RELATED) IMPLANT
CLOTH BEACON ORANGE TIMEOUT ST (SAFETY) ×2 IMPLANT
EYE SHIELD UNIVERSAL CLEAR (GAUZE/BANDAGES/DRESSINGS) ×2 IMPLANT
GLOVE BIO SURGEON STRL SZ 6.5 (GLOVE) IMPLANT
GLOVE BIO SURGEONS STRL SZ 6.5 (GLOVE)
GLOVE BIOGEL PI IND STRL 6.5 (GLOVE) IMPLANT
GLOVE BIOGEL PI IND STRL 7.0 (GLOVE) IMPLANT
GLOVE BIOGEL PI IND STRL 7.5 (GLOVE) IMPLANT
GLOVE BIOGEL PI INDICATOR 6.5 (GLOVE)
GLOVE BIOGEL PI INDICATOR 7.0 (GLOVE) ×2
GLOVE BIOGEL PI INDICATOR 7.5 (GLOVE)
GLOVE ECLIPSE 6.5 STRL STRAW (GLOVE) IMPLANT
GLOVE ECLIPSE 7.0 STRL STRAW (GLOVE) IMPLANT
GLOVE ECLIPSE 7.5 STRL STRAW (GLOVE) IMPLANT
GLOVE EXAM NITRILE LRG STRL (GLOVE) IMPLANT
GLOVE EXAM NITRILE MD LF STRL (GLOVE) ×2 IMPLANT
GLOVE SKINSENSE NS SZ6.5 (GLOVE)
GLOVE SKINSENSE NS SZ7.0 (GLOVE)
GLOVE SKINSENSE STRL SZ6.5 (GLOVE) IMPLANT
GLOVE SKINSENSE STRL SZ7.0 (GLOVE) IMPLANT
KIT VITRECTOMY (OPHTHALMIC RELATED) IMPLANT
PAD ARMBOARD 7.5X6 YLW CONV (MISCELLANEOUS) ×2 IMPLANT
PROC W NO LENS (INTRAOCULAR LENS)
PROC W SPEC LENS (INTRAOCULAR LENS)
PROCESS W NO LENS (INTRAOCULAR LENS) IMPLANT
PROCESS W SPEC LENS (INTRAOCULAR LENS) IMPLANT
RETRACTOR IRIS SIGHTPATH (OPHTHALMIC RELATED) ×1 IMPLANT
RING MALYGIN (MISCELLANEOUS) IMPLANT
SIGHTPATH CAT PROC W REG LENS (Ophthalmic Related) ×3 IMPLANT
SYRINGE LUER LOK 1CC (MISCELLANEOUS) ×2 IMPLANT
TAPE SURG TRANSPORE 1 IN (GAUZE/BANDAGES/DRESSINGS) IMPLANT
TAPE SURGICAL TRANSPORE 1 IN (GAUZE/BANDAGES/DRESSINGS) ×2
VISCOELASTIC ADDITIONAL (OPHTHALMIC RELATED) IMPLANT
WATER STERILE IRR 250ML POUR (IV SOLUTION) ×2 IMPLANT

## 2013-10-14 NOTE — Anesthesia Preprocedure Evaluation (Signed)
Anesthesia Evaluation  Patient identified by MRN, date of birth, ID band Patient awake    Reviewed: Allergy & Precautions, H&P , NPO status , Patient's Chart, lab work & pertinent test results  Airway Mallampati: II TM Distance: >3 FB     Dental  (+) Teeth Intact   Pulmonary shortness of breath, former smoker,  breath sounds clear to auscultation        Cardiovascular hypertension, Pt. on medications + CAD and + Peripheral Vascular Disease Rhythm:Regular Rate:Normal     Neuro/Psych TIA   GI/Hepatic GERD-  Controlled and Medicated,  Endo/Other  diabetes, Type 2, Oral Hypoglycemic AgentsHypothyroidism   Renal/GU      Musculoskeletal  (+) Fibromyalgia -  Abdominal   Peds  Hematology   Anesthesia Other Findings   Reproductive/Obstetrics                           Anesthesia Physical Anesthesia Plan  ASA: III  Anesthesia Plan: MAC   Post-op Pain Management:    Induction: Intravenous  Airway Management Planned: Nasal Cannula  Additional Equipment:   Intra-op Plan:   Post-operative Plan:   Informed Consent: I have reviewed the patients History and Physical, chart, labs and discussed the procedure including the risks, benefits and alternatives for the proposed anesthesia with the patient or authorized representative who has indicated his/her understanding and acceptance.     Plan Discussed with:   Anesthesia Plan Comments:         Anesthesia Quick Evaluation

## 2013-10-14 NOTE — Transfer of Care (Signed)
Immediate Anesthesia Transfer of Care Note  Patient: Debra Hopkins  Procedure(s) Performed: Procedure(s) (LRB): CATARACT EXTRACTION PHACO AND INTRAOCULAR LENS PLACEMENT (IOC) (Right)  Patient Location: Shortstay  Anesthesia Type: MAC  Level of Consciousness: awake  Airway & Oxygen Therapy: Patient Spontanous Breathing   Post-op Assessment: Report given to PACU RN, Post -op Vital signs reviewed and stable and Patient moving all extremities  Post vital signs: Reviewed and stable  Complications: No apparent anesthesia complications

## 2013-10-14 NOTE — H&P (Signed)
I have reviewed the H&P, the patient was re-examined, and I have identified no interval changes in medical condition and plan of care since the history and physical of record  

## 2013-10-14 NOTE — Op Note (Signed)
Date of Admission: 10/14/2013  Date of Surgery: 10/14/2013   Pre-Op Dx: Cataract Right Eye  Post-Op Dx: Senile Combined  Cataract Right  Eye,  Dx Code 366.19  Surgeon: Tonny Branch, M.D.  Assistants: None  Anesthesia: Topical with MAC  Indications: Painless, progressive loss of vision with compromise of daily activities.  Surgery: Cataract Extraction with Intraocular lens Implant Right Eye  Discription: The patient had dilating drops and viscous lidocaine placed into the Right eye in the pre-op holding area. After transfer to the operating room, a time out was performed. The patient was then prepped and draped. Beginning with a 24 degree blade a paracentesis port was made at the surgeon's 2 o'clock position. The anterior chamber was then filled with 1% non-preserved lidocaine. This was followed by filling the anterior chamber with Provisc.  A 2.66mm keratome blade was used to make a clear corneal incision at the temporal limbus.  A bent cystatome needle was used to create a continuous tear capsulotomy. Hydrodissection was performed with balanced salt solution on a Fine canula. The lens nucleus was then removed using the phacoemulsification handpiece. Residual cortex was removed with the I&A handpiece. The anterior chamber and capsular bag were refilled with Provisc. A posterior chamber intraocular lens was placed into the capsular bag with it's injector. The implant was positioned with the Kuglan hook. The Provisc was then removed from the anterior chamber and capsular bag with the I&A handpiece. Stromal hydration of the main incision and paracentesis port was performed with BSS on a Fine canula. The wounds were tested for leak which was negative. The patient tolerated the procedure well. There were no operative complications. The patient was then transferred to the recovery room in stable condition.  Complications: None  Specimen: None  EBL: None  Prosthetic device: Hoya iSert 250, power 18.5 D,  SN [id number].

## 2013-10-14 NOTE — Anesthesia Postprocedure Evaluation (Signed)
  Anesthesia Post-op Note  Patient: Debra Hopkins  Procedure(s) Performed: Procedure(s) (LRB): CATARACT EXTRACTION PHACO AND INTRAOCULAR LENS PLACEMENT (IOC) (Right)  Patient Location:  Short Stay  Anesthesia Type: MAC  Level of Consciousness: awake  Airway and Oxygen Therapy: Patient Spontanous Breathing  Post-op Pain: none  Post-op Assessment: Post-op Vital signs reviewed, Patient's Cardiovascular Status Stable, Respiratory Function Stable, Patent Airway, No signs of Nausea or vomiting and Pain level controlled  Post-op Vital Signs: Reviewed and stable  Complications: No apparent anesthesia complications

## 2013-10-14 NOTE — Anesthesia Procedure Notes (Signed)
Procedure Name: MAC Date/Time: 10/14/2013 11:01 AM Performed by: Vista Deck Pre-anesthesia Checklist: Patient identified, Emergency Drugs available, Suction available, Timeout performed and Patient being monitored Patient Re-evaluated:Patient Re-evaluated prior to inductionOxygen Delivery Method: Nasal Cannula

## 2013-10-15 ENCOUNTER — Encounter (HOSPITAL_COMMUNITY): Payer: Self-pay | Admitting: Ophthalmology

## 2013-10-25 ENCOUNTER — Telehealth (INDEPENDENT_AMBULATORY_CARE_PROVIDER_SITE_OTHER): Payer: Self-pay | Admitting: *Deleted

## 2013-10-25 NOTE — Telephone Encounter (Signed)
2 1/2 week PRN - Dr. Laural Golden prescribed her Reglan and she is affair to take it. Would like for the nurse to return her call at [phone].

## 2013-10-27 NOTE — Telephone Encounter (Signed)
Patient called.She stated the following: The medication ( Reglan) was working. She read the side effects which she normally does not do. She was experiencing nervousness , and rapid heartbeat. She is taking medications that she should not take with this medication. Examples: Trazodone , Codeine that is in a cough medication. She has a history of Murmur , Hypertension,Diabetes. It was prescribed to her a take 1 by mouth Three times daily, patient states that she was only taking 1 a day , in the morning. Since she stopped she feels better , just still has some rapid heart beat. Stopping the medication was like stopping the Nexium , it was 3 fold.   Patient ask, with the report of her procedure , could she just take the Nexium for her treatment.

## 2013-10-27 NOTE — Telephone Encounter (Signed)
Forwarded to Dr.Rehman to address. 

## 2013-10-28 ENCOUNTER — Encounter (HOSPITAL_COMMUNITY): Payer: Self-pay | Admitting: Pharmacy Technician

## 2013-11-01 NOTE — Telephone Encounter (Signed)
Patient is presently on Nexium; she has been off metoclopramide for 5 days. Cough 50% better. Patient will return for office visit in 3 months.

## 2013-11-02 ENCOUNTER — Encounter (HOSPITAL_COMMUNITY)
Admission: RE | Admit: 2013-11-02 | Discharge: 2013-11-02 | Disposition: A | Discharge status: Home / Self Care | Payer: Medicare Other | Source: Ambulatory Visit | Attending: Ophthalmology | Admitting: Ophthalmology

## 2013-11-02 ENCOUNTER — Encounter (HOSPITAL_COMMUNITY): Payer: Self-pay

## 2013-11-02 NOTE — Telephone Encounter (Signed)
LM for patient to return the call.  

## 2013-11-02 NOTE — Pre-Procedure Instructions (Signed)
Pre op phone call completed.  No changes noted in health history to note.  Advised to take the following medications am of procedure with small sip of water: Clonidine, Nexium, Synthroid, Procardia and oxycodone if needed.  Arrival date and time discussed and patient verbalized understanding of information and denied any questions.

## 2013-11-03 MED ORDER — CYCLOPENTOLATE-PHENYLEPHRINE OP SOLN OPTIME - NO CHARGE
OPHTHALMIC | Status: AC
  Filled 2013-11-03: qty 2

## 2013-11-03 MED ORDER — PHENYLEPHRINE HCL 2.5 % OP SOLN
OPHTHALMIC | Status: AC
  Filled 2013-11-03: qty 15

## 2013-11-03 MED ORDER — LIDOCAINE HCL 3.5 % OP GEL
OPHTHALMIC | Status: AC
  Filled 2013-11-03: qty 1

## 2013-11-03 MED ORDER — LIDOCAINE HCL (PF) 1 % IJ SOLN
INTRAMUSCULAR | Status: AC
  Filled 2013-11-03: qty 2

## 2013-11-03 MED ORDER — NEOMYCIN-POLYMYXIN-DEXAMETH 3.5-10000-0.1 OP SUSP
OPHTHALMIC | Status: AC
  Filled 2013-11-03: qty 5

## 2013-11-03 MED ORDER — TETRACAINE HCL 0.5 % OP SOLN
OPHTHALMIC | Status: AC
  Filled 2013-11-03: qty 2

## 2013-11-04 ENCOUNTER — Ambulatory Visit (HOSPITAL_COMMUNITY)
Admission: RE | Admit: 2013-11-04 | Discharge: 2013-11-04 | Disposition: A | Discharge status: Home / Self Care | Payer: Medicare Other | Source: Ambulatory Visit | Attending: Ophthalmology | Admitting: Ophthalmology

## 2013-11-04 ENCOUNTER — Encounter (HOSPITAL_COMMUNITY): Payer: Medicare Other | Admitting: Anesthesiology

## 2013-11-04 ENCOUNTER — Ambulatory Visit (HOSPITAL_COMMUNITY): Payer: Medicare Other | Admitting: Anesthesiology

## 2013-11-04 ENCOUNTER — Encounter (HOSPITAL_COMMUNITY): Payer: Self-pay | Admitting: *Deleted

## 2013-11-04 ENCOUNTER — Encounter (HOSPITAL_COMMUNITY)
Admission: RE | Disposition: A | Discharge status: Home / Self Care | Payer: Self-pay | Source: Ambulatory Visit | Attending: Ophthalmology

## 2013-11-04 DIAGNOSIS — Z79899 Other long term (current) drug therapy: Secondary | ICD-10-CM | POA: Diagnosis not present

## 2013-11-04 DIAGNOSIS — IMO0001 Reserved for inherently not codable concepts without codable children: Secondary | ICD-10-CM | POA: Diagnosis not present

## 2013-11-04 DIAGNOSIS — H2589 Other age-related cataract: Secondary | ICD-10-CM | POA: Insufficient documentation

## 2013-11-04 DIAGNOSIS — E039 Hypothyroidism, unspecified: Secondary | ICD-10-CM | POA: Diagnosis not present

## 2013-11-04 DIAGNOSIS — E119 Type 2 diabetes mellitus without complications: Secondary | ICD-10-CM | POA: Diagnosis not present

## 2013-11-04 DIAGNOSIS — E7800 Pure hypercholesterolemia, unspecified: Secondary | ICD-10-CM | POA: Diagnosis not present

## 2013-11-04 DIAGNOSIS — K219 Gastro-esophageal reflux disease without esophagitis: Secondary | ICD-10-CM | POA: Insufficient documentation

## 2013-11-04 DIAGNOSIS — I10 Essential (primary) hypertension: Secondary | ICD-10-CM | POA: Diagnosis not present

## 2013-11-04 DIAGNOSIS — Z01812 Encounter for preprocedural laboratory examination: Secondary | ICD-10-CM | POA: Insufficient documentation

## 2013-11-04 HISTORY — PX: CATARACT EXTRACTION W/PHACO: SHX586

## 2013-11-04 LAB — GLUCOSE, CAPILLARY: Glucose-Capillary: 141 mg/dL — ABNORMAL HIGH (ref 70–99)

## 2013-11-04 SURGERY — PHACOEMULSIFICATION, CATARACT, WITH IOL INSERTION
Anesthesia: Monitor Anesthesia Care | Site: Eye | Laterality: Left

## 2013-11-04 MED ORDER — FENTANYL CITRATE 0.05 MG/ML IJ SOLN
25.0000 ug | INTRAMUSCULAR | Status: AC
  Administered 2013-11-04 (×2): 25 ug via INTRAVENOUS

## 2013-11-04 MED ORDER — LACTATED RINGERS IV SOLN
INTRAVENOUS | Status: DC
  Administered 2013-11-04: 1000 mL via INTRAVENOUS

## 2013-11-04 MED ORDER — LIDOCAINE HCL (PF) 1 % IJ SOLN
INTRAMUSCULAR | Status: DC | PRN
  Administered 2013-11-04: .6 mL

## 2013-11-04 MED ORDER — PHENYLEPHRINE HCL 2.5 % OP SOLN
1.0000 [drp] | OPHTHALMIC | Status: AC
  Administered 2013-11-04 (×3): 1 [drp] via OPHTHALMIC

## 2013-11-04 MED ORDER — LIDOCAINE HCL 3.5 % OP GEL
1.0000 "application " | Freq: Once | OPHTHALMIC | Status: AC
  Administered 2013-11-04: 1 via OPHTHALMIC

## 2013-11-04 MED ORDER — EPINEPHRINE HCL 1 MG/ML IJ SOLN
INTRAMUSCULAR | Status: AC
  Filled 2013-11-04: qty 1

## 2013-11-04 MED ORDER — FENTANYL CITRATE 0.05 MG/ML IJ SOLN
INTRAMUSCULAR | Status: AC
  Filled 2013-11-04: qty 2

## 2013-11-04 MED ORDER — MIDAZOLAM HCL 2 MG/2ML IJ SOLN
INTRAMUSCULAR | Status: AC
  Filled 2013-11-04: qty 2

## 2013-11-04 MED ORDER — LIDOCAINE 3.5 % OP GEL OPTIME - NO CHARGE
OPHTHALMIC | Status: DC | PRN
  Administered 2013-11-04: 1 [drp] via OPHTHALMIC

## 2013-11-04 MED ORDER — EPINEPHRINE HCL 1 MG/ML IJ SOLN
INTRAOCULAR | Status: DC | PRN
  Administered 2013-11-04: 11:00:00

## 2013-11-04 MED ORDER — POVIDONE-IODINE 5 % OP SOLN
OPHTHALMIC | Status: DC | PRN
  Administered 2013-11-04: 1 via OPHTHALMIC

## 2013-11-04 MED ORDER — NEOMYCIN-POLYMYXIN-DEXAMETH 3.5-10000-0.1 OP SUSP
OPHTHALMIC | Status: DC | PRN
  Administered 2013-11-04: 2 [drp] via OPHTHALMIC

## 2013-11-04 MED ORDER — MIDAZOLAM HCL 2 MG/2ML IJ SOLN
1.0000 mg | INTRAMUSCULAR | Status: DC | PRN
  Administered 2013-11-04 (×2): 2 mg via INTRAVENOUS

## 2013-11-04 MED ORDER — TETRACAINE HCL 0.5 % OP SOLN
1.0000 [drp] | OPHTHALMIC | Status: AC
  Administered 2013-11-04 (×3): 1 [drp] via OPHTHALMIC

## 2013-11-04 MED ORDER — PROVISC 10 MG/ML IO SOLN
INTRAOCULAR | Status: DC | PRN
  Administered 2013-11-04: 0.85 mL via INTRAOCULAR

## 2013-11-04 MED ORDER — BSS IO SOLN
INTRAOCULAR | Status: DC | PRN
  Administered 2013-11-04: 15 mL

## 2013-11-04 MED ORDER — MIDAZOLAM HCL 2 MG/2ML IJ SOLN
INTRAMUSCULAR | Status: AC
Start: 2013-11-04 — End: 2013-11-04
  Filled 2013-11-04: qty 2

## 2013-11-04 MED ORDER — CYCLOPENTOLATE-PHENYLEPHRINE 0.2-1 % OP SOLN
1.0000 [drp] | OPHTHALMIC | Status: AC
  Administered 2013-11-04 (×3): 1 [drp] via OPHTHALMIC

## 2013-11-04 SURGICAL SUPPLY — 34 items
CAPSULAR TENSION RING-AMO (OPHTHALMIC RELATED) IMPLANT
CLOTH BEACON ORANGE TIMEOUT ST (SAFETY) ×2 IMPLANT
EYE SHIELD UNIVERSAL CLEAR (GAUZE/BANDAGES/DRESSINGS) ×2 IMPLANT
GLOVE BIO SURGEON STRL SZ 6.5 (GLOVE) IMPLANT
GLOVE BIO SURGEONS STRL SZ 6.5 (GLOVE)
GLOVE BIOGEL PI IND STRL 6.5 (GLOVE) IMPLANT
GLOVE BIOGEL PI IND STRL 7.0 (GLOVE) IMPLANT
GLOVE BIOGEL PI IND STRL 7.5 (GLOVE) IMPLANT
GLOVE BIOGEL PI INDICATOR 6.5 (GLOVE) ×2
GLOVE BIOGEL PI INDICATOR 7.0 (GLOVE) ×2
GLOVE BIOGEL PI INDICATOR 7.5 (GLOVE)
GLOVE ECLIPSE 6.5 STRL STRAW (GLOVE) IMPLANT
GLOVE ECLIPSE 7.0 STRL STRAW (GLOVE) IMPLANT
GLOVE ECLIPSE 7.5 STRL STRAW (GLOVE) IMPLANT
GLOVE EXAM NITRILE LRG STRL (GLOVE) IMPLANT
GLOVE EXAM NITRILE MD LF STRL (GLOVE) ×2 IMPLANT
GLOVE SKINSENSE NS SZ6.5 (GLOVE)
GLOVE SKINSENSE NS SZ7.0 (GLOVE)
GLOVE SKINSENSE STRL SZ6.5 (GLOVE) IMPLANT
GLOVE SKINSENSE STRL SZ7.0 (GLOVE) IMPLANT
KIT VITRECTOMY (OPHTHALMIC RELATED) IMPLANT
PAD ARMBOARD 7.5X6 YLW CONV (MISCELLANEOUS) ×2 IMPLANT
PROC W NO LENS (INTRAOCULAR LENS)
PROC W SPEC LENS (INTRAOCULAR LENS)
PROCESS W NO LENS (INTRAOCULAR LENS) IMPLANT
PROCESS W SPEC LENS (INTRAOCULAR LENS) IMPLANT
RETRACTOR IRIS SIGHTPATH (OPHTHALMIC RELATED) IMPLANT
RING MALYGIN (MISCELLANEOUS) IMPLANT
SIGHTPATH CAT PROC W REG LENS (Ophthalmic Related) ×3 IMPLANT
SYRINGE LUER LOK 1CC (MISCELLANEOUS) ×2 IMPLANT
TAPE SURG TRANSPORE 1 IN (GAUZE/BANDAGES/DRESSINGS) IMPLANT
TAPE SURGICAL TRANSPORE 1 IN (GAUZE/BANDAGES/DRESSINGS) ×2
VISCOELASTIC ADDITIONAL (OPHTHALMIC RELATED) IMPLANT
WATER STERILE IRR 250ML POUR (IV SOLUTION) ×2 IMPLANT

## 2013-11-04 NOTE — Op Note (Signed)
Date of Admission: 11/04/2013  Date of Surgery: 11/04/2013   Pre-Op Dx: Cataract Left Eye  Post-Op Dx: Senile Combined  Cataract Left  Eye,  Dx Code 366.19  Surgeon: Tonny Branch, M.D.  Assistants: None  Anesthesia: Topical with MAC  Indications: Painless, progressive loss of vision with compromise of daily activities.  Surgery: Cataract Extraction with Intraocular lens Implant Left Eye  Discription: The patient had dilating drops and viscous lidocaine placed into the Left eye in the pre-op holding area. After transfer to the operating room, a time out was performed. The patient was then prepped and draped. Beginning with a 103 degree blade a paracentesis port was made at the surgeon's 2 o'clock position. The anterior chamber was then filled with 1% non-preserved lidocaine. This was followed by filling the anterior chamber with Provisc.  A 2.22mm keratome blade was used to make a clear corneal incision at the temporal limbus.  A bent cystatome needle was used to create a continuous tear capsulotomy. Hydrodissection was performed with balanced salt solution on a Fine canula. The lens nucleus was then removed using the phacoemulsification handpiece. Residual cortex was removed with the I&A handpiece. The anterior chamber and capsular bag were refilled with Provisc. A posterior chamber intraocular lens was placed into the capsular bag with it's injector. The implant was positioned with the Kuglan hook. The Provisc was then removed from the anterior chamber and capsular bag with the I&A handpiece. Stromal hydration of the main incision and paracentesis port was performed with BSS on a Fine canula. The wounds were tested for leak which was negative. The patient tolerated the procedure well. There were no operative complications. The patient was then transferred to the recovery room in stable condition.  Complications: None  Specimen: None  EBL: None  Prosthetic device: Hoya iSert 250, power 20.0 D, SN  [id number].

## 2013-11-04 NOTE — Telephone Encounter (Signed)
Apt has been scheduled for 02/08/14 at 11:30 am with Dr. Laural Golden

## 2013-11-04 NOTE — Discharge Instructions (Signed)

## 2013-11-04 NOTE — H&P (Signed)
I have reviewed the H&P, the patient was re-examined, and I have identified no interval changes in medical condition and plan of care since the history and physical of record  

## 2013-11-04 NOTE — Anesthesia Postprocedure Evaluation (Signed)
  Anesthesia Post-op Note  Patient: Debra Hopkins  Procedure(s) Performed: Procedure(s) with comments: CATARACT EXTRACTION PHACO AND INTRAOCULAR LENS PLACEMENT (IOC) (Left) - CDE 8.57  Patient Location: Short Stay  Anesthesia Type:MAC  Level of Consciousness: awake, alert , oriented and patient cooperative  Airway and Oxygen Therapy: Patient Spontanous Breathing  Post-op Pain: none  Post-op Assessment: Post-op Vital signs reviewed, Patient's Cardiovascular Status Stable, Respiratory Function Stable, Patent Airway and Pain level controlled  Post-op Vital Signs: Reviewed and stable  Last Vitals:  Filed Vitals:   11/04/13 1045  BP: 115/65  Pulse:   Temp:   Resp: 18    Complications: No apparent anesthesia complications

## 2013-11-04 NOTE — Transfer of Care (Signed)
Immediate Anesthesia Transfer of Care Note  Patient: Debra Hopkins  Procedure(s) Performed: Procedure(s) with comments: CATARACT EXTRACTION PHACO AND INTRAOCULAR LENS PLACEMENT (IOC) (Left) - CDE 8.57  Patient Location: Short Stay  Anesthesia Type:MAC  Level of Consciousness: awake, alert , oriented and patient cooperative  Airway & Oxygen Therapy: Patient Spontanous Breathing  Post-op Assessment: Report given to PACU RN, Post -op Vital signs reviewed and stable and Patient moving all extremities  Post vital signs: Reviewed and stable  Complications: No apparent anesthesia complications

## 2013-11-04 NOTE — Anesthesia Preprocedure Evaluation (Signed)
Anesthesia Evaluation  Patient identified by MRN, date of birth, ID band Patient awake    Reviewed: Allergy & Precautions, H&P , NPO status , Patient's Chart, lab work & pertinent test results  Airway Mallampati: II TM Distance: >3 FB     Dental  (+) Teeth Intact   Pulmonary shortness of breath, former smoker,  breath sounds clear to auscultation        Cardiovascular hypertension, Pt. on medications + CAD and + Peripheral Vascular Disease Rhythm:Regular Rate:Normal     Neuro/Psych TIA   GI/Hepatic GERD-  Controlled and Medicated,  Endo/Other  diabetes, Type 2, Oral Hypoglycemic AgentsHypothyroidism   Renal/GU      Musculoskeletal  (+) Fibromyalgia -  Abdominal   Peds  Hematology   Anesthesia Other Findings   Reproductive/Obstetrics                           Anesthesia Physical Anesthesia Plan  ASA: III  Anesthesia Plan: MAC   Post-op Pain Management:    Induction: Intravenous  Airway Management Planned: Nasal Cannula  Additional Equipment:   Intra-op Plan:   Post-operative Plan:   Informed Consent: I have reviewed the patients History and Physical, chart, labs and discussed the procedure including the risks, benefits and alternatives for the proposed anesthesia with the patient or authorized representative who has indicated his/her understanding and acceptance.     Plan Discussed with:   Anesthesia Plan Comments:         Anesthesia Quick Evaluation

## 2013-11-05 ENCOUNTER — Encounter (HOSPITAL_COMMUNITY): Payer: Self-pay | Admitting: Ophthalmology

## 2013-12-11 ENCOUNTER — Other Ambulatory Visit (INDEPENDENT_AMBULATORY_CARE_PROVIDER_SITE_OTHER): Payer: Self-pay | Admitting: Internal Medicine

## 2013-12-13 ENCOUNTER — Other Ambulatory Visit (INDEPENDENT_AMBULATORY_CARE_PROVIDER_SITE_OTHER): Payer: Self-pay | Admitting: Internal Medicine

## 2014-02-08 ENCOUNTER — Ambulatory Visit (INDEPENDENT_AMBULATORY_CARE_PROVIDER_SITE_OTHER): Payer: Medicare Other | Admitting: Internal Medicine

## 2014-02-08 ENCOUNTER — Encounter (INDEPENDENT_AMBULATORY_CARE_PROVIDER_SITE_OTHER): Payer: Self-pay | Admitting: Internal Medicine

## 2014-02-08 VITALS — BP 124/66 | HR 64 | Temp 97.5°F | Resp 18 | Ht 63.0 in | Wt 204.1 lb

## 2014-02-08 DIAGNOSIS — R05 Cough: Secondary | ICD-10-CM

## 2014-02-08 DIAGNOSIS — R059 Cough, unspecified: Secondary | ICD-10-CM

## 2014-02-08 DIAGNOSIS — K219 Gastro-esophageal reflux disease without esophagitis: Secondary | ICD-10-CM | POA: Insufficient documentation

## 2014-02-08 NOTE — Progress Notes (Addendum)
Presenting complaint;  Follow for GERD dysphagia and cough.  Database;  Patient is 73 year old Caucasian female was chronic GERD and presented back in July with dysphagia a few months duration as well as cough. She underwent EGD with ED on 10/01/2013. She had known critical Schatzki's ring. This ring was disrupted by passing before [place] dilator and focal biopsy. She has small sliding hiatal hernia. She was maintained on Nexium and prescription was given for metoclopramide. Patient called a few weeks later that metoclopramide was making her nervous and and noted palpitation. Therefore this medication was stopped with resolution of side effects. She now returns for scheduled visit.  Subjective;  Patient states her dysphagia has resolved. She is not having any heartburn regurgitation or need to clear her throat. However she continues to complain of cough which occurs primarily while she is eating her meals. Cough is more pronounced at breakfast time. At times it is intractable and she she cannot breathe and feels like she will pass out. She does not have postprandial or nocturnal cough. She denies abdominal pain melena or rectal bleeding. She had chest x-ray on 08/12/2013 and was within normal limits. She has tried other PPIs but these don't work. She also has tried OTC Nexium and it does not work. Her co-pay every month is $45.   Current Medications: Outpatient Encounter Prescriptions as of 02/08/2014  Medication Sig  . aspirin (ECOTRIN LOW STRENGTH) 81 MG EC tablet Take 81 mg by mouth daily. Swallow whole.  . calcium carbonate (TUMS - DOSED IN MG ELEMENTAL CALCIUM) 500 MG chewable tablet Chew 1 tablet by mouth daily.   . cloNIDine (CATAPRES) 0.1 MG tablet Take 0.1 mg by mouth 3 (three) times daily.  Marland Kitchen co-enzyme Q-10 30 MG capsule Take 100 mg by mouth daily.  Marland Kitchen esomeprazole (NEXIUM) 40 MG capsule Take 40 mg by mouth daily before breakfast.  . FLUZONE HIGH-DOSE 0.5 ML SUSY   .  Glucosamine-Chondroitin (OSTEO BI-FLEX REGULAR STRENGTH PO) Take 1 tablet by mouth daily.    . hydrALAZINE (APRESOLINE) 25 MG tablet Take 25 mg by mouth daily.  Marland Kitchen levothyroxine (SYNTHROID, LEVOTHROID) 50 MCG tablet Take 50 mcg by mouth daily before breakfast.  . losartan (COZAAR) 100 MG tablet Take 100 mg by mouth daily.    . metFORMIN (GLUCOPHAGE) 500 MG tablet Take 500 mg by mouth 2 (two) times daily with a meal.  . methylcellulose (CITRUCEL) oral powder Take by mouth. 1 TBSP Twice Weekly   . Multiple Vitamins-Minerals (PRESERVISION AREDS 2 PO) Take 2 tablets by mouth daily.  Marland Kitchen NIFEdipine (PROCARDIA-XL/ADALAT-CC/NIFEDICAL-XL) 30 MG 24 hr tablet Take 30 mg by mouth daily.  . Omega-3 Fatty Acids (FISH OIL) 1000 MG CAPS Take 1,100 mg by mouth daily.   Marland Kitchen OVER THE COUNTER MEDICATION Nature's Gummies  For hair nails and skin - Patient states that she chews 2 by mouth daily.  Marland Kitchen OVER THE COUNTER MEDICATION Rainbow light for women's one a day- Patient taked 1 by mouth daily  . simvastatin (ZOCOR) 20 MG tablet Take 20 mg by mouth at bedtime.   . torsemide (DEMADEX) 20 MG tablet Take 10-20 mg by mouth daily. 20 mg in the morning and 10 mg at lunch.  . traZODone (DESYREL) 50 MG tablet Take 100 mg by mouth at bedtime.   . [DISCONTINUED] Biotin 1000 MCG tablet Take 1,000 mcg by mouth daily.    . [DISCONTINUED] clobetasol cream (TEMOVATE) 1.47 % Apply 1 application topically 2 (two) times daily.  . [DISCONTINUED]  esomeprazole (NEXIUM) 40 MG capsule Take 40 mg by mouth daily at 12 noon.  . [DISCONTINUED] folic acid (FOLVITE) 768 MCG tablet Take 800 mcg by mouth daily.   . [DISCONTINUED] metoCLOPramide (REGLAN) 10 MG tablet TAKE 1 TABLET BY MOUTH THREE TIMES DAILY BEFORE MEALS (Patient not taking: Reported on 02/08/2014)  . [DISCONTINUED] Multiple Minerals-Vitamins (CALCIUM CITRATE PLUS PO) Take 3 tablets by mouth daily. 1000mg  of calcium with 400units of vitamin D and 500mg  of magnesium.  . [DISCONTINUED]  Multiple Vitamins-Minerals (MULTIVITAL) tablet Take 3 tablets by mouth daily.   . [DISCONTINUED] oxyCODONE-acetaminophen (PERCOCET) 5-325 MG per tablet Take 1 tablet by mouth every 4 (four) hours as needed for pain.      Objective: Blood pressure 124/66, pulse 64, temperature 97.5 F (36.4 C), temperature source Oral, resp. rate 18, height 5\' 3"  (1.6 m), weight 204 lb 1.6 oz (92.579 kg). Patient is alert and in no acute distress. Conjunctiva is pink. Sclera is nonicteric Oropharyngeal mucosa is normal. No neck masses or thyromegaly noted. Cardiac exam with regular rhythm normal S1 and S2. No murmur or gallop noted. Lungs are clear to auscultation. Abdomen is full but soft and nontender without organomegaly or masses. Trace edema around both ankles slightly more pronounced on the left side.   Assessment:  #1.  GERD. She has chronic GERD. Typical symptoms are well-controlled with therapy. #2. Dysphagia has completely resolved since Schatzki's ring was disrupted on EGD of 10/01/2013. #3. Persistent cough which is primarily experienced at mealtime. This symptom is not typical of GERD and I wonder if she is aspirating while she is eating. There is no history of aspiration pneumonia. Since cough did not respond to therapy further evaluation is warranted.   Plan:  Continue anti-reflex measures and Nexium at 40 mg by mouth every morning. ENT consultation with Dr. Barrington Ellison. If ENT evaluation is negative will consider referral to speech pathologist. Office visit in 1 year.

## 2014-02-08 NOTE — Patient Instructions (Addendum)
Consultation with Dr. Redmond Pulling in [facility]. Call if swallowing difficulty recurs

## 2014-03-10 ENCOUNTER — Ambulatory Visit: Payer: Medicare Other | Admitting: Family

## 2014-03-10 ENCOUNTER — Encounter (HOSPITAL_COMMUNITY): Payer: Medicare Other

## 2014-03-10 ENCOUNTER — Other Ambulatory Visit (HOSPITAL_COMMUNITY): Payer: Medicare Other

## 2014-03-23 ENCOUNTER — Encounter: Payer: Self-pay | Admitting: Family

## 2014-03-24 ENCOUNTER — Ambulatory Visit (HOSPITAL_COMMUNITY)
Admission: RE | Admit: 2014-03-24 | Discharge: 2014-03-24 | Disposition: A | Discharge status: Home / Self Care | Payer: Medicare Other | Source: Ambulatory Visit | Attending: Family | Admitting: Family

## 2014-03-24 ENCOUNTER — Ambulatory Visit (INDEPENDENT_AMBULATORY_CARE_PROVIDER_SITE_OTHER): Payer: Medicare Other | Admitting: Family

## 2014-03-24 ENCOUNTER — Encounter: Payer: Self-pay | Admitting: Family

## 2014-03-24 ENCOUNTER — Ambulatory Visit (INDEPENDENT_AMBULATORY_CARE_PROVIDER_SITE_OTHER)
Admission: RE | Admit: 2014-03-24 | Discharge: 2014-03-24 | Disposition: A | Discharge status: Home / Self Care | Payer: Medicare Other | Source: Ambulatory Visit | Attending: Family | Admitting: Family

## 2014-03-24 VITALS — BP 147/80 | HR 80 | Resp 16 | Ht 63.0 in | Wt 201.0 lb

## 2014-03-24 DIAGNOSIS — Z87891 Personal history of nicotine dependence: Secondary | ICD-10-CM

## 2014-03-24 DIAGNOSIS — Z48812 Encounter for surgical aftercare following surgery on the circulatory system: Secondary | ICD-10-CM

## 2014-03-24 DIAGNOSIS — I6523 Occlusion and stenosis of bilateral carotid arteries: Secondary | ICD-10-CM

## 2014-03-24 DIAGNOSIS — E119 Type 2 diabetes mellitus without complications: Secondary | ICD-10-CM

## 2014-03-24 DIAGNOSIS — I739 Peripheral vascular disease, unspecified: Secondary | ICD-10-CM

## 2014-03-24 DIAGNOSIS — Z9889 Other specified postprocedural states: Secondary | ICD-10-CM

## 2014-03-24 DIAGNOSIS — E669 Obesity, unspecified: Secondary | ICD-10-CM

## 2014-03-24 NOTE — Patient Instructions (Signed)

## 2014-03-24 NOTE — Progress Notes (Signed)
[facility] [place] HISTORY AND PHYSICAL   MRN : [id number]  History of Present Illness:   Debra Hopkins is a 74 y.o. female patient of Dr. Kellie Simmering who is status post left CEA in September 2008, left SFA PTA with stent 2003. She returns today for surveillance. Cramping in left calf after walking about 1/2 block, no change in claudication symptoms in the last year; denies non-healing wounds, denies rest pain, denies claudication symptoms in right leg. Pt admits to not walking as much due to claudication and painful arthritis in her feet at times. States her last TIA was about 1995 as manifested by feeling dizzy and nauseated for about 15 minutes; she was then told by a medical provider that she had TIA's. She did not have hemiplegia, monocular blindness, facial drooping, or aphasia. Her DM is in good control, she stopped smoking in the 1990's, she takes a daily ASA and a statin, she is obese. She rarely drinks ETOH.  Pt Diabetic: Yes, states in good control Pt smoker: former smoker, quit in the 1990's  Pt meds include: Statin :Yes ASA: Yes Other anticoagulants/antiplatelets: no  Current Outpatient Prescriptions  Medication Sig Dispense Refill  . aspirin (ECOTRIN LOW STRENGTH) 81 MG EC tablet Take 81 mg by mouth daily. Swallow whole.    . cloNIDine (CATAPRES) 0.1 MG tablet Take 0.1 mg by mouth 3 (three) times daily.    Marland Kitchen co-enzyme Q-10 30 MG capsule Take 100 mg by mouth daily.    Marland Kitchen esomeprazole (NEXIUM) 40 MG capsule Take 40 mg by mouth daily before breakfast.    . Glucosamine-Chondroitin (OSTEO BI-FLEX REGULAR STRENGTH PO) Take 1 tablet by mouth daily.      . hydrALAZINE (APRESOLINE) 25 MG tablet Take 25 mg by mouth daily.    Marland Kitchen levothyroxine (SYNTHROID, LEVOTHROID) 50 MCG tablet Take 50 mcg by mouth daily before breakfast.    . losartan (COZAAR) 100 MG tablet Take 100 mg by mouth daily.      . metFORMIN (GLUCOPHAGE) 500 MG tablet Take 500 mg by mouth 2 (two) times  daily with a meal.    . methylcellulose (CITRUCEL) oral powder Take by mouth. 1 TBSP Twice Weekly     . Multiple Vitamins-Minerals (PRESERVISION AREDS 2 PO) Take 2 tablets by mouth daily.    Marland Kitchen NIFEdipine (PROCARDIA-XL/ADALAT-CC/NIFEDICAL-XL) 30 MG 24 hr tablet Take 30 mg by mouth daily.    . Omega-3 Fatty Acids (FISH OIL) 1000 MG CAPS Take 1,100 mg by mouth daily.     Marland Kitchen OVER THE COUNTER MEDICATION Nature's Gummies  For hair nails and skin - Patient states that she chews 2 by mouth daily.    Marland Kitchen OVER THE COUNTER MEDICATION Rainbow light for women's one a day- Patient taked 1 by mouth daily    . simvastatin (ZOCOR) 20 MG tablet Take 20 mg by mouth at bedtime.     . torsemide (DEMADEX) 20 MG tablet Take 10-20 mg by mouth daily. 20 mg in the morning and 10 mg at lunch.    . traZODone (DESYREL) 50 MG tablet Take 100 mg by mouth at bedtime.     . calcium carbonate (TUMS - DOSED IN MG ELEMENTAL CALCIUM) 500 MG chewable tablet Chew 1 tablet by mouth daily.     Marland Kitchen FLUZONE HIGH-DOSE 0.5 ML SUSY   0   No current facility-administered medications for this visit.    Past Medical History  Diagnosis Date  . Unspecified essential hypertension   . Other  and unspecified hyperlipidemia   . PVD (peripheral vascular disease)   . Dyspnea   . GERD (gastroesophageal reflux disease)   . Arthritis   . Diabetes mellitus     Type 2  . Fibromyalgia   . TIA (transient ischemic attack)   . Cataracts, bilateral   . Varicose veins   . Impaired hearing   . History of shingles   . CAD (coronary artery disease)   . Hemorrhoids   . Diverticulitis   . DDD (degenerative disc disease)   . Macular degeneration   . Carotid artery occlusion     Social History History  Substance Use Topics  . Smoking status: Former Smoker    Types: Cigarettes    Quit date: 03/11/1985  . Smokeless tobacco: Never Used  . Alcohol Use: No    Family History Family History  Problem Relation Age of Onset  . Coronary artery disease  Other   . Stroke Mother   . Hypertension Mother   . Stroke Father   . Hypertension Father   . Diabetes Father   . Coronary artery disease Father   . Diabetes Sister   . Hypertension Sister   . Lung cancer Brother   . Sinusitis Brother   . Healthy Brother   . Diabetes Son   . Hypertension Son     Surgical History Past Surgical History  Procedure Laterality Date  . Breast biopsy    . Kidney stone surgery  1988  . Aspiration of a cyst in the right breast    . Appendectomy    . Cardiac catheterization    . Spine surgery  1997    C5-c6 fusion  . Knee arthroscopy  2009  . Joint replacement  2011    Right total knee replacement  . Total abdominal hysterectomy  1973  . Carotid endarterectomy  12-03-06    left CEA  . Angioplasty      left leg 2003  . Esophagogastroduodenoscopy N/A 10/01/2013    Procedure: ESOPHAGOGASTRODUODENOSCOPY (EGD);  Surgeon: Rogene Houston, MD;  [facility];  Service: Endoscopy;  Laterality: N/A;  120  . Balloon dilation N/A 10/01/2013    Procedure: BALLOON DILATION;  Surgeon: Rogene Houston, MD;  [facility]: [facility];  Service: Endoscopy;  Laterality: N/A;  Venia Minks dilation N/A 10/01/2013    Procedure: Venia Minks DILATION;  Surgeon: Rogene Houston, MD;  [facility];  Service: Endoscopy;  Laterality: N/A;  . Savory dilation N/A 10/01/2013    Procedure: SAVORY DILATION;  Surgeon: Rogene Houston, MD;  [facility];  Service: Endoscopy;  Laterality: N/A;  . Cataract extraction w/phaco Right 10/14/2013    Procedure: CATARACT EXTRACTION PHACO AND INTRAOCULAR LENS PLACEMENT (IOC);  Surgeon: Tonny Branch, MD;  Location: [facility];  Service: Ophthalmology;  Laterality: Right;  CDE 7.20  . Cataract extraction w/phaco Left 11/04/2013    Procedure: CATARACT EXTRACTION PHACO AND INTRAOCULAR LENS PLACEMENT (IOC);  Surgeon: Tonny Branch, MD;  Location: [facility];  Service: Ophthalmology;  Laterality: Left;  CDE 8.57    Allergies  Allergen  Reactions  . Amoxicillin-Pot Clavulanate     REACTION: hives  . Amoxicillin-Pot Clavulanate Hives  . Ciprofloxacin Nausea Only    Dizziness  . Erythromycin     REACTION: Upset stomach  . Motrin [Ibuprofen]     REACTION: nausea  . Naproxen     REACTION: severe nausea  . Prednisone     REACTION: itchy rash  .  Sulfamethoxazole-Trimethoprim     REACTION: Upset stomach    Current Outpatient Prescriptions  Medication Sig Dispense Refill  . aspirin (ECOTRIN LOW STRENGTH) 81 MG EC tablet Take 81 mg by mouth daily. Swallow whole.    . cloNIDine (CATAPRES) 0.1 MG tablet Take 0.1 mg by mouth 3 (three) times daily.    Marland Kitchen co-enzyme Q-10 30 MG capsule Take 100 mg by mouth daily.    Marland Kitchen esomeprazole (NEXIUM) 40 MG capsule Take 40 mg by mouth daily before breakfast.    . Glucosamine-Chondroitin (OSTEO BI-FLEX REGULAR STRENGTH PO) Take 1 tablet by mouth daily.      . hydrALAZINE (APRESOLINE) 25 MG tablet Take 25 mg by mouth daily.    Marland Kitchen levothyroxine (SYNTHROID, LEVOTHROID) 50 MCG tablet Take 50 mcg by mouth daily before breakfast.    . losartan (COZAAR) 100 MG tablet Take 100 mg by mouth daily.      . metFORMIN (GLUCOPHAGE) 500 MG tablet Take 500 mg by mouth 2 (two) times daily with a meal.    . methylcellulose (CITRUCEL) oral powder Take by mouth. 1 TBSP Twice Weekly     . Multiple Vitamins-Minerals (PRESERVISION AREDS 2 PO) Take 2 tablets by mouth daily.    Marland Kitchen NIFEdipine (PROCARDIA-XL/ADALAT-CC/NIFEDICAL-XL) 30 MG 24 hr tablet Take 30 mg by mouth daily.    . Omega-3 Fatty Acids (FISH OIL) 1000 MG CAPS Take 1,100 mg by mouth daily.     Marland Kitchen OVER THE COUNTER MEDICATION Nature's Gummies  For hair nails and skin - Patient states that she chews 2 by mouth daily.    Marland Kitchen OVER THE COUNTER MEDICATION Rainbow light for women's one a day- Patient taked 1 by mouth daily    . simvastatin (ZOCOR) 20 MG tablet Take 20 mg by mouth at bedtime.     . torsemide (DEMADEX) 20 MG tablet Take 10-20 mg by mouth daily. 20 mg in  the morning and 10 mg at lunch.    . traZODone (DESYREL) 50 MG tablet Take 100 mg by mouth at bedtime.     . calcium carbonate (TUMS - DOSED IN MG ELEMENTAL CALCIUM) 500 MG chewable tablet Chew 1 tablet by mouth daily.     Marland Kitchen FLUZONE HIGH-DOSE 0.5 ML SUSY   0   No current facility-administered medications for this visit.     REVIEW OF SYSTEMS: See HPI for pertinent positives and negatives.  Physical Examination Filed Vitals:   03/24/14 1152 03/24/14 1159  BP: 133/65 147/80  Pulse: 51 80  Resp: 16   Height: 5\' 3"  (1.6 m)   Weight: 201 lb (91.173 kg)    Body mass index is 35.61 kg/(m^2).   General: Obese female in NAD Gait: Normal HENT: WNL Eyes: Pupils equal Pulmonary: normal non-labored breathing , without Rales, rhonchi, wheezing Cardiac: RRR, with Murmur  Abdomen: soft, NT, no palpable masses Skin: no rashes, ulcers; no Gangrene , no cellulitis; no open wounds;   Vascular Exam/Pulses: VASCULAR EXAM  Carotid Bruits Left Right   transmitted cardiac murmur Positive     VASCULAR EXAM: Extremities without ischemic changes, 1+ bilateral pitting pretibial edema without Gangrene; without open wounds.     LE Pulses LEFT RIGHT   FEMORAL 2+ palpable 2+ palpable    POPLITEAL not palpable  not palpable   POSTERIOR TIBIAL not palpable  not palpable    DORSALIS PEDIS  ANTERIOR TIBIAL not palpable  not palpable      Musculoskeletal: no muscle wasting or atrophy; trace bilateral LE pretiibial edema  Neurologic: A&O X 3; Appropriate Affect ;  SENSATION: normal; MOTOR FUNCTION: 4/5 Symmetric, CN 2-12 intact Speech is fluent/normal    Non-Invasive Vascular Imaging (03/24/2014):   CEREBROVASCULAR DUPLEX EVALUATION    INDICATION: Carotid artery  disease     PREVIOUS INTERVENTION(S): Left carotid endarterectomy 11/30/2006.    DUPLEX EXAM:     RIGHT  LEFT  Peak Systolic Velocities (cm/s) End Diastolic Velocities (cm/s) Plaque LOCATION Peak Systolic Velocities (cm/s) End Diastolic Velocities (cm/s) Plaque  72 14  CCA PROXIMAL 75 17   77 16  CCA MID 79 22   64 18  CCA DISTAL 89 28   286 20 HT ECA 82 11   83 23 HT ICA PROXIMAL 76 21   88 28  ICA MID 94 28   76 29  ICA DISTAL 118 36     1.14 ICA / CCA Ratio (PSV) NA  Antegrade  Vertebral Flow Antegrade   456 Brachial Systolic Pressure (mmHg) 256  Triphasic  Brachial Artery Waveforms Triphasic     Plaque Morphology:  HM = Homogeneous, HT = Heterogeneous, CP = Calcific Plaque, SP = Smooth Plaque, IP = Irregular Plaque     ADDITIONAL FINDINGS:     IMPRESSION: Right internal carotid artery velocities suggest a <40% stenosis. Patent left carotid endarterectomy site with no evidence of hyperplasia or restenosis.  Right external carotid artery stenosis present.     Compared to the previous exam:  No significant change in comparison to the last exam on 03/09/2013.    ABI (Date: 03/24/2014)  R: 0.53 (03/09/2013, 0.79), DP: monophasic, PT: monophasic, TBI: 0.29  L: 0.75 (0.95), DP: biphasic, PT: biphasic, TBI: 0.52   ASSESSMENT:  KEIR FOLAND is a 74 y.o. female who is status post left CEA in September 2008, left SFA PTA with stent 2003. She has no stroke history, her last TIA was in 1995. Today's carotid Duplex reveals minimal right ICA stenosis and a patent left ICA s/p endarterectomy. No significant change in comparison to the last exam on 03/09/2013.  Steady decline of bilateral ABI's over the last couple of years, no tissue loss, she does not walk much due to painful feet. Face to face time with patient was 25 minutes. Over 50% of this time was spent on counseling and coordination of care.    PLAN:   Graduated walking program, daily seated leg exercises. Based on  today's exam and non-invasive vascular lab results, and after discussing with Dr. Oneida Alar,  the patient will follow up in 1 year with the following tests: carotid Duplex and ABI's, she knows to return sooner if needed. I discussed in depth with the patient the nature of atherosclerosis, and emphasized the importance of maximal medical management including strict control of blood pressure, blood glucose, and lipid levels, obtaining regular exercise, and cessation of smoking.  The patient is aware that without maximal medical management the underlying atherosclerotic disease process will progress, limiting the benefit of any interventions.  The patient was given information about stroke prevention and what symptoms should prompt the patient to seek immediate medical care.  The patient was given information about PAD including signs, symptoms, treatment, what symptoms should prompt the patient to seek immediate medical care, and risk reduction measures to take. Thank you for allowing [place] to participate in this patient's care.  Clemon Chambers, RN, MSN, FNP-C [facility] Office: [phone]  Clinic MD: [facility] 03/24/2014 12:17 PM

## 2014-03-25 NOTE — Addendum Note (Signed)
Addended by: Mena Goes on: 03/25/2014 09:14 AM   Modules accepted: Orders

## 2014-08-22 ENCOUNTER — Ambulatory Visit (INDEPENDENT_AMBULATORY_CARE_PROVIDER_SITE_OTHER): Payer: Medicare Other | Admitting: Internal Medicine

## 2014-08-22 ENCOUNTER — Encounter (INDEPENDENT_AMBULATORY_CARE_PROVIDER_SITE_OTHER): Payer: Self-pay | Admitting: Internal Medicine

## 2014-08-22 ENCOUNTER — Other Ambulatory Visit (INDEPENDENT_AMBULATORY_CARE_PROVIDER_SITE_OTHER): Payer: Self-pay | Admitting: Internal Medicine

## 2014-08-22 VITALS — BP 128/46 | HR 68 | Temp 97.7°F | Ht 63.0 in | Wt 205.8 lb

## 2014-08-22 DIAGNOSIS — D508 Other iron deficiency anemias: Secondary | ICD-10-CM

## 2014-08-22 LAB — ANEMIA PANEL 1
ABS RETIC: 67.8 10*3/uL (ref 19.0–186.0)
Basophils Absolute: 0 10*3/uL (ref 0.0–0.1)
Basophils Relative: 0 % (ref 0–1)
EOS ABS: 0.1 10*3/uL (ref 0.0–0.7)
Eosinophils Relative: 1 % (ref 0–5)
HCT: 29.4 % — ABNORMAL LOW (ref 36.0–46.0)
Hemoglobin: 9.9 g/dL — ABNORMAL LOW (ref 12.0–15.0)
LYMPHS ABS: 1.5 10*3/uL (ref 0.7–4.0)
Lymphocytes Relative: 24 % (ref 12–46)
MCH: 30.7 pg (ref 26.0–34.0)
MCHC: 33.7 g/dL (ref 30.0–36.0)
MCV: 91 fL (ref 78.0–100.0)
MONO ABS: 0.6 10*3/uL (ref 0.1–1.0)
MONOS PCT: 10 % (ref 3–12)
MPV: 9.3 fL (ref 8.6–12.4)
Neutro Abs: 4.1 10*3/uL (ref 1.7–7.7)
Neutrophils Relative %: 65 % (ref 43–77)
PLATELETS: 468 10*3/uL — AB (ref 150–400)
RBC.: 3.23 MIL/uL — ABNORMAL LOW (ref 3.87–5.11)
RBC: 3.23 MIL/uL — ABNORMAL LOW (ref 3.87–5.11)
RDW: 16.5 % — AB (ref 11.5–15.5)
Retic Ct Pct: 2.1 % (ref 0.4–2.3)
WBC: 6.3 10*3/uL (ref 4.0–10.5)

## 2014-08-22 NOTE — Progress Notes (Addendum)
Subjective:    Patient ID: Debra Hopkins, female    DOB: 01-22-1941, 74 y.o.   MRN: [id number]  HPI Referred to our office for progressive anemia.  PCP Dr. Manuella Ghazi, [facility].  Patient tells me her hemoglobin was 9.7 in May 07/30/2014.There has been no weight loss.  She denies any acid reflux with the Nexium. Appetite is fair. No dysphagia.  She has had some left lower abdominal pain off and on for 2-3 months. Rates pain 4-5 when it occurs. She describes it has a twinge. She usually has a BM daily. Sometimes she may skin a day or two. She denies seeing any blood. Her stool cards at [facility] were negative. Her hemoglobin from Epic has ran in this range for several years.  07/30/2014 H and H 9.7 and 28.9. MCV 91. ,ferritin 152, T4 1.52  Her last colonoscopy was in 2011 which revealed a few small diverticula at sigmoid colon, external hemorrhoids, otherwise normal examination .  10/01/2013 EGD/ED:   Indications: Patient is 74 year old Caucasian female with chronic GERD and now presents with a four-month history of dysphagia. She also has cough that would not go away. Impression: Prominent inter arytenoid fold. Small sliding hiatal hernia with noncritical Schatzki's ring. Esophagus dilated by passing 50 French Maloney dilator ring was intact and disrupted with focal biopsy three sites  Review of Systems Past Medical History  Diagnosis Date  . Unspecified essential hypertension   . Other and unspecified hyperlipidemia   . PVD (peripheral vascular disease)   . Dyspnea   . GERD (gastroesophageal reflux disease)   . Arthritis   . Diabetes mellitus     Type 2  . Fibromyalgia   . TIA (transient ischemic attack)   . Cataracts, bilateral   . Varicose veins   . Impaired hearing   . History of shingles   . CAD (coronary artery disease)   . Hemorrhoids   . Diverticulitis   . DDD (degenerative disc disease)   .  Macular degeneration   . Carotid artery occlusion     Past Surgical History  Procedure Laterality Date  . Breast biopsy    . Kidney stone surgery  1988  . Aspiration of a cyst in the right breast    . Appendectomy    . Cardiac catheterization    . Spine surgery  1997    C5-c6 fusion  . Knee arthroscopy  2009  . Joint replacement  2011    Right total knee replacement  . Total abdominal hysterectomy  1973  . Carotid endarterectomy  12-03-06    left CEA  . Angioplasty      left leg 2003  . Esophagogastroduodenoscopy N/A 10/01/2013    Procedure: ESOPHAGOGASTRODUODENOSCOPY (EGD);  Surgeon: Rogene Houston, MD;  [facility];  Service: Endoscopy;  Laterality: N/A;  120  . Balloon dilation N/A 10/01/2013    Procedure: BALLOON DILATION;  Surgeon: Rogene Houston, MD;  [facility];  Service: Endoscopy;  Laterality: N/A;  Venia Minks dilation N/A 10/01/2013    Procedure: Venia Minks DILATION;  Surgeon: Rogene Houston, MD;  [facility];  Service: Endoscopy;  Laterality: N/A;  . Savory dilation N/A 10/01/2013    Procedure: SAVORY DILATION;  Surgeon: Rogene Houston, MD;  [facility];  Service: Endoscopy;  Laterality: N/A;  . Cataract extraction w/phaco Right 10/14/2013    Procedure: CATARACT EXTRACTION PHACO AND INTRAOCULAR LENS PLACEMENT (IOC);  Surgeon: Tonny Branch, MD;  Location: [facility];  Service: Ophthalmology;  Laterality: Right;  CDE 7.20  . Cataract extraction w/phaco Left 11/04/2013    Procedure: CATARACT EXTRACTION PHACO AND INTRAOCULAR LENS PLACEMENT (IOC);  Surgeon: Tonny Branch, MD;  Location: [facility];  Service: Ophthalmology;  Laterality: Left;  CDE 8.57    Allergies  Allergen Reactions  . Amoxicillin-Pot Clavulanate     REACTION: hives  . Amoxicillin-Pot Clavulanate Hives  . Ciprofloxacin Nausea Only    Dizziness  . Erythromycin     REACTION: Upset stomach  . Motrin [Ibuprofen]     REACTION: nausea  . Naproxen     REACTION: severe nausea   . Prednisone     REACTION: itchy rash  . Sulfamethoxazole-Trimethoprim     REACTION: Upset stomach    Current Outpatient Prescriptions on File Prior to Visit  Medication Sig Dispense Refill  . aspirin (ECOTRIN LOW STRENGTH) 81 MG EC tablet Take 81 mg by mouth daily. Swallow whole.    . calcium carbonate (TUMS - DOSED IN MG ELEMENTAL CALCIUM) 500 MG chewable tablet Chew 1 tablet by mouth daily.     . cloNIDine (CATAPRES) 0.1 MG tablet Take 0.1 mg by mouth 3 (three) times daily.    Marland Kitchen co-enzyme Q-10 30 MG capsule Take 100 mg by mouth daily.    Marland Kitchen esomeprazole (NEXIUM) 40 MG capsule Take 40 mg by mouth daily before breakfast.    . Glucosamine-Chondroitin (OSTEO BI-FLEX REGULAR STRENGTH PO) Take 1 tablet by mouth daily.      . hydrALAZINE (APRESOLINE) 25 MG tablet Take 25 mg by mouth daily.    Marland Kitchen levothyroxine (SYNTHROID, LEVOTHROID) 50 MCG tablet Take 50 mcg by mouth daily before breakfast.    . losartan (COZAAR) 100 MG tablet Take 100 mg by mouth daily.      . metFORMIN (GLUCOPHAGE) 500 MG tablet Take 500 mg by mouth 2 (two) times daily with a meal.    . methylcellulose (CITRUCEL) oral powder Take by mouth. 1 TBSP Twice Weekly     . Multiple Vitamins-Minerals (PRESERVISION AREDS 2 PO) Take 2 tablets by mouth daily.    Marland Kitchen NIFEdipine (PROCARDIA-XL/ADALAT-CC/NIFEDICAL-XL) 30 MG 24 hr tablet Take 30 mg by mouth daily.    . Omega-3 Fatty Acids (FISH OIL) 1000 MG CAPS Take 1,100 mg by mouth daily.     Marland Kitchen OVER THE COUNTER MEDICATION Rainbow light for women's one a day- Patient taked 1 by mouth daily    . simvastatin (ZOCOR) 20 MG tablet Take 10 mg by mouth at bedtime.     . torsemide (DEMADEX) 20 MG tablet Take 10-20 mg by mouth daily. 20 mg in the morning and 10 mg at lunch.    . traZODone (DESYREL) 50 MG tablet Take 100 mg by mouth at bedtime.     Marland Kitchen FLUZONE HIGH-DOSE 0.5 ML SUSY   0   No current facility-administered medications on file prior to visit.        Objective:   Physical ExamBlood  pressure 128/46, pulse 68, temperature 97.7 F (36.5 C), height 5\' 3"  (1.6 m), weight 205 lb 12.8 oz (93.35 kg). Alert and oriented. Skin warm and dry. Oral mucosa is moist.   . Sclera anicteric, conjunctivae is pink. Thyroid not enlarged. No cervical lymphadenopathy. Lungs clear. Heart regular rate and rhythm. Murmur heard  Abdomen is soft. Bowel sounds are positive. No hepatomegaly. No abdominal masses felt. No tenderness. 1+ edema to lower extremities.  Stool brown and guaiac negative.    Developer: [phone] (  01/2018) Card: Lot [id number] (12/18)     Assessment & Plan:  Anemia, Ferritin is normal. Will get an Anemia profile and repeat CBC. Her hemoglobin has been in the 9.7 in this range since 2008. Will schedule a colonoscopy with Dr. Laural Golden.

## 2014-08-22 NOTE — Patient Instructions (Signed)
Iron studies. Colonoscopy. The risks and benefits such as perforation, bleeding, and infection were reviewed with the patient and is agreeable.

## 2014-08-23 LAB — VITAMIN B12: Vitamin B-12: 1453 pg/mL — ABNORMAL HIGH (ref 211–911)

## 2014-08-23 LAB — IRON AND TIBC
%SAT: 27 % (ref 20–55)
Iron: 79 ug/dL (ref 42–145)
TIBC: 290 ug/dL (ref 250–470)
UIBC: 211 ug/dL (ref 125–400)

## 2014-08-23 LAB — FERRITIN: Ferritin: 145 ng/mL (ref 10–291)

## 2014-08-24 ENCOUNTER — Other Ambulatory Visit (INDEPENDENT_AMBULATORY_CARE_PROVIDER_SITE_OTHER): Payer: Self-pay | Admitting: *Deleted

## 2014-08-24 DIAGNOSIS — D649 Anemia, unspecified: Secondary | ICD-10-CM

## 2014-08-29 ENCOUNTER — Telehealth (INDEPENDENT_AMBULATORY_CARE_PROVIDER_SITE_OTHER): Payer: Self-pay | Admitting: *Deleted

## 2014-08-29 NOTE — Telephone Encounter (Signed)
Patient needs trilyte 

## 2014-09-01 MED ORDER — PEG 3350-KCL-NA BICARB-NACL 420 G PO SOLR: 4000.0000 mL | Freq: Once | ORAL | Status: DC

## 2014-09-19 ENCOUNTER — Encounter (HOSPITAL_COMMUNITY): Payer: Self-pay | Admitting: *Deleted

## 2014-09-19 ENCOUNTER — Encounter (HOSPITAL_COMMUNITY)
Admission: RE | Disposition: A | Discharge status: Home Health | Payer: Self-pay | Source: Ambulatory Visit | Attending: Internal Medicine

## 2014-09-19 ENCOUNTER — Ambulatory Visit (HOSPITAL_COMMUNITY)
Admission: RE | Admit: 2014-09-19 | Discharge: 2014-09-19 | Disposition: A | Discharge status: Home Health | Payer: Medicare Other | Source: Ambulatory Visit | Attending: Internal Medicine | Admitting: Internal Medicine

## 2014-09-19 DIAGNOSIS — Z7982 Long term (current) use of aspirin: Secondary | ICD-10-CM | POA: Insufficient documentation

## 2014-09-19 DIAGNOSIS — D649 Anemia, unspecified: Secondary | ICD-10-CM | POA: Diagnosis not present

## 2014-09-19 DIAGNOSIS — D125 Benign neoplasm of sigmoid colon: Secondary | ICD-10-CM | POA: Diagnosis not present

## 2014-09-19 DIAGNOSIS — R1030 Lower abdominal pain, unspecified: Secondary | ICD-10-CM

## 2014-09-19 DIAGNOSIS — K5730 Diverticulosis of large intestine without perforation or abscess without bleeding: Secondary | ICD-10-CM | POA: Insufficient documentation

## 2014-09-19 DIAGNOSIS — R194 Change in bowel habit: Secondary | ICD-10-CM | POA: Diagnosis not present

## 2014-09-19 DIAGNOSIS — Z79899 Other long term (current) drug therapy: Secondary | ICD-10-CM | POA: Diagnosis not present

## 2014-09-19 DIAGNOSIS — I10 Essential (primary) hypertension: Secondary | ICD-10-CM | POA: Diagnosis not present

## 2014-09-19 DIAGNOSIS — Z87891 Personal history of nicotine dependence: Secondary | ICD-10-CM | POA: Diagnosis not present

## 2014-09-19 DIAGNOSIS — I2510 Atherosclerotic heart disease of native coronary artery without angina pectoris: Secondary | ICD-10-CM | POA: Insufficient documentation

## 2014-09-19 DIAGNOSIS — E119 Type 2 diabetes mellitus without complications: Secondary | ICD-10-CM | POA: Diagnosis not present

## 2014-09-19 DIAGNOSIS — K921 Melena: Secondary | ICD-10-CM | POA: Diagnosis not present

## 2014-09-19 DIAGNOSIS — Z96651 Presence of right artificial knee joint: Secondary | ICD-10-CM | POA: Insufficient documentation

## 2014-09-19 HISTORY — PX: COLONOSCOPY: SHX5424

## 2014-09-19 LAB — GLUCOSE, CAPILLARY: Glucose-Capillary: 134 mg/dL — ABNORMAL HIGH (ref 65–99)

## 2014-09-19 SURGERY — COLONOSCOPY
Anesthesia: Moderate Sedation

## 2014-09-19 MED ORDER — SIMETHICONE 40 MG/0.6ML PO SUSP
ORAL | Status: DC | PRN
  Administered 2014-09-19: 08:00:00

## 2014-09-19 MED ORDER — SODIUM CHLORIDE 0.9 % IV SOLN
INTRAVENOUS | Status: DC
  Administered 2014-09-19: 07:00:00 via INTRAVENOUS

## 2014-09-19 MED ORDER — MEPERIDINE HCL 50 MG/ML IJ SOLN
INTRAMUSCULAR | Status: DC | PRN
  Administered 2014-09-19 (×2): 25 mg via INTRAVENOUS

## 2014-09-19 MED ORDER — MIDAZOLAM HCL 5 MG/5ML IJ SOLN
INTRAMUSCULAR | Status: AC
  Filled 2014-09-19: qty 10

## 2014-09-19 MED ORDER — MEPERIDINE HCL 50 MG/ML IJ SOLN
INTRAMUSCULAR | Status: AC
  Filled 2014-09-19: qty 1

## 2014-09-19 MED ORDER — MIDAZOLAM HCL 5 MG/5ML IJ SOLN
INTRAMUSCULAR | Status: DC | PRN
  Administered 2014-09-19 (×3): 2 mg via INTRAVENOUS

## 2014-09-19 NOTE — Op Note (Signed)
COLONOSCOPY PROCEDURE REPORT  PATIENT:  Debra Hopkins  MR#:  [id number] Birthdate:  [birth year], 74 y.o., female Endoscopist:  Dr. Rogene Houston, MD Referred By:  Ms. Particia Nearing, NP  Procedure Date: 09/19/2014  Procedure:   Colonoscopy  Indications:  Patient is 74 year old Caucasian female who presents with few months history of lower abdominal pain change in caliber for stools and anemia concerning for GI blood loss although it has not been documented. He had single episode of melena. Patient's last EGD was in July 2015 and colonoscopy was in 2011. Patient is undergoing diagnostic colonoscopy.  Informed Consent:  The procedure and risks were reviewed with the patient and informed consent was obtained.  Medications:  Demerol 50 mg IV Versed 6 mg IV  Description of procedure:  After a digital rectal exam was performed, that colonoscope was advanced from the anus through the rectum and colon to the area of the cecum, ileocecal valve and appendiceal orifice. The cecum was deeply intubated. These structures were well-seen and photographed for the record. From the level of the cecum and ileocecal valve, the scope was slowly and cautiously withdrawn. The mucosal surfaces were carefully surveyed utilizing scope tip to flexion to facilitate fold flattening as needed. The scope was pulled down into the rectum where a thorough exam including retroflexion was performed.  Findings:   Prep excellent. 6 mm polyp cold snared from mid sigmoid colon in 2 pieces. Smaller piece was lost. Scattered diverticula at sigmoid colon. Normal rectal mucosa and anorectal junction.   Therapeutic/Diagnostic Maneuvers Performed:  See above  Complications:  None  Cecal Withdrawal Time:  14  minutes  Impression:  Examination performed to cecum. 6 mm polyp cold snared from sigmoid colon in two pieces. Smaller piece was lost. Mild sigmoid colon diverticulosis.  Recommendations:  Standard instructions  given. Hemoccults 1 if she has black stools. I will contact patient with biopsy results and further recommendations.  Bryson Palen U  09/19/2014 8:24 AM  CC: Dr. Monico Blitz, MD & Dr. Rayne Du ref. provider found

## 2014-09-19 NOTE — H&P (Addendum)
Debra Hopkins is an 74 y.o. female.   Chief Complaint: Patient is here for colonoscopy. HPI: Patient is 74 year old Caucasian female who presents with few months history of lower abdominal pain and change in caliber for stools. She also has been noted have drop in her H&H but there is no history of rectal bleeding in her stool was guaiac negative. She has history of GERD and underwent EGD and July 2015. She also underwent esophageal dilation and she is able to swallow fine. His last colonoscopy was in 2011. She does not take OTC NSAIDs other than low-dose aspirin. Family history is negative for CRC.  Past Medical History  Diagnosis Date  . Unspecified essential hypertension   . Other and unspecified hyperlipidemia   . PVD (peripheral vascular disease)   . Dyspnea   . GERD (gastroesophageal reflux disease)   . Arthritis   . Diabetes mellitus     Type 2  . Fibromyalgia   . TIA (transient ischemic attack)   . Cataracts, bilateral   . Varicose veins   . Impaired hearing   . History of shingles   . CAD (coronary artery disease)   . Hemorrhoids   . Diverticulitis   . DDD (degenerative disc disease)   . Macular degeneration   . Carotid artery occlusion     Past Surgical History  Procedure Laterality Date  . Breast biopsy    . Kidney stone surgery  1988  . Aspiration of a cyst in the right breast    . Appendectomy    . Cardiac catheterization    . Spine surgery  1997    C5-c6 fusion  . Knee arthroscopy  2009  . Joint replacement  2011    Right total knee replacement  . Total abdominal hysterectomy  1973  . Carotid endarterectomy  12-03-06    left CEA  . Angioplasty      left leg 2003  . Esophagogastroduodenoscopy N/A 10/01/2013    Procedure: ESOPHAGOGASTRODUODENOSCOPY (EGD);  Surgeon: Rogene Houston, MD;  [facility]: [facility];  Service: Endoscopy;  Laterality: N/A;  120  . Balloon dilation N/A 10/01/2013    Procedure: BALLOON DILATION;  Surgeon: Rogene Houston, MD;   Location: [facility];  Service: Endoscopy;  Laterality: N/A;  Venia Minks dilation N/A 10/01/2013    Procedure: Venia Minks DILATION;  Surgeon: Rogene Houston, MD;  Location: [facility];  Service: Endoscopy;  Laterality: N/A;  . Savory dilation N/A 10/01/2013    Procedure: SAVORY DILATION;  Surgeon: Rogene Houston, MD;  Location: [facility];  Service: Endoscopy;  Laterality: N/A;  . Cataract extraction w/phaco Right 10/14/2013    Procedure: CATARACT EXTRACTION PHACO AND INTRAOCULAR LENS PLACEMENT (IOC);  Surgeon: Tonny Branch, MD;  Location: [facility];  Service: Ophthalmology;  Laterality: Right;  CDE 7.20  . Cataract extraction w/phaco Left 11/04/2013    Procedure: CATARACT EXTRACTION PHACO AND INTRAOCULAR LENS PLACEMENT (IOC);  Surgeon: Tonny Branch, MD;  Location: [facility];  Service: Ophthalmology;  Laterality: Left;  CDE 8.57    Family History  Problem Relation Age of Onset  . Coronary artery disease Other   . Stroke Mother   . Hypertension Mother   . Stroke Father   . Hypertension Father   . Diabetes Father   . Coronary artery disease Father   . Diabetes Sister   . Hypertension Sister   . Lung cancer Brother   . Sinusitis Brother   . Healthy Brother   .  Diabetes Son   . Hypertension Son    Social History:  reports that she quit smoking about 29 years ago. Her smoking use included Cigarettes. She has never used smokeless tobacco. She reports that she does not drink alcohol or use illicit drugs.  Allergies:  Allergies  Allergen Reactions  . Amoxicillin-Pot Clavulanate     REACTION: hives  . Amoxicillin-Pot Clavulanate Hives  . Ciprofloxacin Nausea Only    Dizziness  . Erythromycin     REACTION: Upset stomach  . Motrin [Ibuprofen]     REACTION: nausea  . Naproxen     REACTION: severe nausea  . Prednisone     REACTION: itchy rash  . Sulfamethoxazole-Trimethoprim     REACTION: Upset stomach    Medications Prior to Admission  Medication Sig Dispense Refill  . aspirin  (ECOTRIN LOW STRENGTH) 81 MG EC tablet Take 81 mg by mouth daily. Swallow whole.    . calcium carbonate (TUMS - DOSED IN MG ELEMENTAL CALCIUM) 500 MG chewable tablet Chew 1 tablet by mouth daily.     . cloNIDine (CATAPRES) 0.1 MG tablet Take 0.1 mg by mouth 3 (three) times daily.    Marland Kitchen co-enzyme Q-10 30 MG capsule Take 100 mg by mouth daily.    Marland Kitchen esomeprazole (NEXIUM) 40 MG capsule Take 40 mg by mouth daily before breakfast.    . FLUZONE HIGH-DOSE 0.5 ML SUSY   0  . Glucosamine-Chondroitin (OSTEO BI-FLEX REGULAR STRENGTH PO) Take 1 tablet by mouth daily.      . hydrALAZINE (APRESOLINE) 25 MG tablet Take 25 mg by mouth daily.    Marland Kitchen levothyroxine (SYNTHROID, LEVOTHROID) 50 MCG tablet Take 50 mcg by mouth daily before breakfast.    . losartan (COZAAR) 100 MG tablet Take 100 mg by mouth daily.      . metFORMIN (GLUCOPHAGE) 500 MG tablet Take 500 mg by mouth 2 (two) times daily with a meal.    . methylcellulose (CITRUCEL) oral powder Take by mouth. 1 TBSP Twice Weekly     . Multiple Vitamins-Minerals (PRESERVISION AREDS 2 PO) Take 2 tablets by mouth daily.    Marland Kitchen NIFEdipine (PROCARDIA-XL/ADALAT-CC/NIFEDICAL-XL) 30 MG 24 hr tablet Take 30 mg by mouth daily.    . Omega-3 Fatty Acids (FISH OIL) 1000 MG CAPS Take 1,100 mg by mouth daily.     Marland Kitchen OVER THE COUNTER MEDICATION Rainbow light for women's one a day- Patient taked 1 by mouth daily    . oxyCODONE-acetaminophen (PERCOCET/ROXICET) 5-325 MG per tablet Take 0.5 tablets by mouth every 4 (four) hours as needed for moderate pain or severe pain.     . polyethylene glycol-electrolytes (NULYTELY/GOLYTELY) 420 G solution Take 4,000 mLs by mouth once. 4000 mL 0  . simvastatin (ZOCOR) 20 MG tablet Take 10 mg by mouth at bedtime.     . torsemide (DEMADEX) 20 MG tablet Take 10-20 mg by mouth daily. 20 mg in the morning and 10 mg at lunch.    . traZODone (DESYREL) 50 MG tablet Take 100 mg by mouth at bedtime.       No results found for this or any previous visit  (from the past 48 hour(s)). No results found.  ROS  Blood pressure 127/48, pulse 65, temperature 98 F (36.7 C), temperature source Oral, resp. rate 16, height 5\' 3"  (1.6 m), weight 200 lb (90.719 kg), SpO2 97 %. Physical Exam  Constitutional: She appears well-developed and well-nourished.  HENT:  Mouth/Throat: Oropharynx is clear and moist.  Eyes: Conjunctivae are  normal. No scleral icterus.  Neck: No thyromegaly present.  Left carotid endarterectomy scar  Cardiovascular: Normal rate, regular rhythm and normal heart sounds.   No murmur heard. Respiratory: Effort normal and breath sounds normal.  GI: Soft. She exhibits distension (mild tenderness across lower abdomen).  Musculoskeletal: She exhibits no edema.  Lymphadenopathy:    She has no cervical adenopathy.  Neurological: She is alert.  Skin: Skin is warm and dry.     Assessment/Plan Change in bowel habits and lower abdominal pain. Anemia and history of melena(single episode). Patient had EGD in July 2015. Diagnostic colonoscopy.  REHMAN,NAJEEB U 09/19/2014, 7:36 AM

## 2014-09-19 NOTE — Discharge Instructions (Signed)
Resume usual medications and high fiber diet. No driving for 24 hours. Hemoccults if stools turned black again. Physician will call with biopsy results.  Colonoscopy, Care After Refer to this sheet in the next few weeks. These instructions provide you with information on caring for yourself after your procedure. Your health care provider may also give you more specific instructions. Your treatment has been planned according to current medical practices, but problems sometimes occur. Call your health care provider if you have any problems or questions after your procedure. WHAT TO EXPECT AFTER THE PROCEDURE  After your procedure, it is typical to have the following:  A small amount of blood in your stool.  Moderate amounts of gas and mild abdominal cramping or bloating. HOME CARE INSTRUCTIONS  Do not drive, operate machinery, or sign important documents for 24 hours.  You may shower and resume your regular physical activities, but move at a slower pace for the first 24 hours.  Take frequent rest periods for the first 24 hours.  Walk around or put a warm pack on your abdomen to help reduce abdominal cramping and bloating.  Drink enough fluids to keep your urine clear or pale yellow.  You may resume your normal diet as instructed by your health care provider. Avoid heavy or fried foods that are hard to digest.  Avoid drinking alcohol for 24 hours or as instructed by your health care provider.  Only take over-the-counter or prescription medicines as directed by your health care provider.  If a tissue sample (biopsy) was taken during your procedure:  Do not take aspirin or blood thinners for 7 days, or as instructed by your health care provider.  Do not drink alcohol for 7 days, or as instructed by your health care provider.  Eat soft foods for the first 24 hours. SEEK MEDICAL CARE IF: You have persistent spotting of blood in your stool 2-3 days after the procedure. SEEK IMMEDIATE  MEDICAL CARE IF:  You have more than a small spotting of blood in your stool.  You pass large blood clots in your stool.  Your abdomen is swollen (distended).  You have nausea or vomiting.  You have a fever.  You have increasing abdominal pain that is not relieved with medicine.  High-Fiber Diet Fiber is found in fruits, vegetables, and grains. A high-fiber diet encourages the addition of more whole grains, legumes, fruits, and vegetables in your diet. The recommended amount of fiber for adult males is 38 g per day. For adult females, it is 25 g per day. Pregnant and lactating women should get 28 g of fiber per day. If you have a digestive or bowel problem, ask your caregiver for advice before adding high-fiber foods to your diet. Eat a variety of high-fiber foods instead of only a select few type of foods.  PURPOSE  To increase stool bulk.  To make bowel movements more regular to prevent constipation.  To lower cholesterol.  To prevent overeating. WHEN IS THIS DIET USED?  It may be used if you have constipation and hemorrhoids.  It may be used if you have uncomplicated diverticulosis (intestine condition) and irritable bowel syndrome.  It may be used if you need help with weight management.  It may be used if you want to add it to your diet as a protective measure against atherosclerosis, diabetes, and cancer. SOURCES OF FIBER  Whole-grain breads and cereals.  Fruits, such as apples, oranges, bananas, berries, prunes, and pears.  Vegetables, such as green  peas, carrots, sweet potatoes, beets, broccoli, cabbage, spinach, and artichokes.  Legumes, such split peas, soy, lentils.  Almonds. FIBER CONTENT IN FOODS Starches and Grains / Dietary Fiber (g)  Cheerios, 1 cup / 3 g  Corn Flakes cereal, 1 cup / 0.7 g  Rice crispy treat cereal, 1 cup / 0.3 g  Instant oatmeal (cooked),  cup / 2 g  Frosted wheat cereal, 1 cup / 5.1 g  Brown, long-grain rice (cooked), 1  cup / 3.5 g  White, long-grain rice (cooked), 1 cup / 0.6 g  Enriched macaroni (cooked), 1 cup / 2.5 g Legumes / Dietary Fiber (g)  Baked beans (canned, plain, or vegetarian),  cup / 5.2 g  Kidney beans (canned),  cup / 6.8 g  Pinto beans (cooked),  cup / 5.5 g Breads and Crackers / Dietary Fiber (g)  Plain or honey graham crackers, 2 squares / 0.7 g  Saltine crackers, 3 squares / 0.3 g  Plain, salted pretzels, 10 pieces / 1.8 g  Whole-wheat bread, 1 slice / 1.9 g  White bread, 1 slice / 0.7 g  Raisin bread, 1 slice / 1.2 g  Plain bagel, 3 oz / 2 g  Flour tortilla, 1 oz / 0.9 g  Corn tortilla, 1 small / 1.5 g  Hamburger or hotdog bun, 1 small / 0.9 g Fruits / Dietary Fiber (g)  Apple with skin, 1 medium / 4.4 g  Sweetened applesauce,  cup / 1.5 g  Banana,  medium / 1.5 g  Grapes, 10 grapes / 0.4 g  Orange, 1 small / 2.3 g  Raisin, 1.5 oz / 1.6 g  Melon, 1 cup / 1.4 g Vegetables / Dietary Fiber (g)  Green beans (canned),  cup / 1.3 g  Carrots (cooked),  cup / 2.3 g  Broccoli (cooked),  cup / 2.8 g  Peas (cooked),  cup / 4.4 g  Mashed potatoes,  cup / 1.6 g  Lettuce, 1 cup / 0.5 g  Corn (canned),  cup / 1.6 g  Tomato,  cup / 1.1 g  Colon Polyps Polyps are lumps of extra tissue growing inside the body. Polyps can grow in the large intestine (colon). Most colon polyps are noncancerous (benign). However, some colon polyps can become cancerous over time. Polyps that are larger than a pea may be harmful. To be safe, caregivers remove and test all polyps. CAUSES  Polyps form when mutations in the genes cause your cells to grow and divide even though no more tissue is needed. RISK FACTORS There are a number of risk factors that can increase your chances of getting colon polyps. They include:  Being older than 50 years.  Family history of colon polyps or colon cancer.  Long-term colon diseases, such as colitis or Crohn disease.  Being  overweight.  Smoking.  Being inactive.  Drinking too much alcohol. SYMPTOMS  Most small polyps do not cause symptoms. If symptoms are present, they may include:  Blood in the stool. The stool may look dark red or black.  Constipation or diarrhea that lasts longer than 1 week. DIAGNOSIS People often do not know they have polyps until their caregiver finds them during a regular checkup. Your caregiver can use 4 tests to check for polyps:  Digital rectal exam. The caregiver wears gloves and feels inside the rectum. This test would find polyps only in the rectum.  Barium enema. The caregiver puts a liquid called barium into your rectum before taking X-rays of  your colon. Barium makes your colon look white. Polyps are dark, so they are easy to see in the X-ray pictures.  Sigmoidoscopy. A thin, flexible tube (sigmoidoscope) is placed into your rectum. The sigmoidoscope has a light and tiny camera in it. The caregiver uses the sigmoidoscope to look at the last third of your colon.  Colonoscopy. This test is like sigmoidoscopy, but the caregiver looks at the entire colon. This is the most common method for finding and removing polyps. TREATMENT  Any polyps will be removed during a sigmoidoscopy or colonoscopy. The polyps are then tested for cancer. PREVENTION  To help lower your risk of getting more colon polyps:  Eat plenty of fruits and vegetables. Avoid eating fatty foods.  Do not smoke.  Avoid drinking alcohol.  Exercise every day.  Lose weight if recommended by your caregiver.  Eat plenty of calcium and folate. Foods that are rich in calcium include milk, cheese, and broccoli. Foods that are rich in folate include chickpeas, kidney beans, and spinach. HOME CARE INSTRUCTIONS Keep all follow-up appointments as directed by your caregiver. You may need periodic exams to check for polyps. SEEK MEDICAL CARE IF: You notice bleeding during a bowel  movement. Diverticulosis Diverticulosis is the condition that develops when small pouches (diverticula) form in the wall of your colon. Your colon, or large intestine, is where water is absorbed and stool is formed. The pouches form when the inside layer of your colon pushes through weak spots in the outer layers of your colon. CAUSES  No one knows exactly what causes diverticulosis. RISK FACTORS  Being older than 60. Your risk for this condition increases with age. Diverticulosis is rare in people younger than 40 years. By age 24, almost everyone has it.  Eating a low-fiber diet.  Being frequently constipated.  Being overweight.  Not getting enough exercise.  Smoking.  Taking over-the-counter pain medicines, like aspirin and ibuprofen. SYMPTOMS  Most people with diverticulosis do not have symptoms. DIAGNOSIS  Because diverticulosis often has no symptoms, health care providers often discover the condition during an exam for other colon problems. In many cases, a health care provider will diagnose diverticulosis while using a flexible scope to examine the colon (colonoscopy). TREATMENT  If you have never developed an infection related to diverticulosis, you may not need treatment. If you have had an infection before, treatment may include:  Eating more fruits, vegetables, and grains.  Taking a fiber supplement.  Taking a live bacteria supplement (probiotic).  Taking medicine to relax your colon. HOME CARE INSTRUCTIONS   Drink at least 6-8 glasses of water each day to prevent constipation.  Try not to strain when you have a bowel movement.  Keep all follow-up appointments. If you have had an infection before:  Increase the fiber in your diet as directed by your health care provider or dietitian.  Take a dietary fiber supplement if your health care provider approves.  Only take medicines as directed by your health care provider. SEEK MEDICAL CARE IF:   You have  abdominal pain.  You have bloating.  You have cramps.  You have not gone to the bathroom in 3 days. SEEK IMMEDIATE MEDICAL CARE IF:   Your pain gets worse.  Yourbloating becomes very bad.  You have a fever or chills, and your symptoms suddenly get worse.  You begin vomiting.  You have bowel movements that are bloody or black. MAKE SURE YOU:  Understand these instructions.  Will watch your condition.  Will  get help right away if you are not doing well or get worse. Document Released: 11/23/2003 Document Revised: 03/02/2013 Document Reviewed: 01/20/2013 [facility] Patient Information 2015 [place], [place]. This information is not intended to replace advice given to you by your health care provider. Make sure you discuss any questions you have with your health care provider.

## 2014-09-22 ENCOUNTER — Encounter (INDEPENDENT_AMBULATORY_CARE_PROVIDER_SITE_OTHER): Payer: Self-pay

## 2014-09-23 ENCOUNTER — Encounter (HOSPITAL_COMMUNITY): Payer: Self-pay | Admitting: Internal Medicine

## 2014-10-03 ENCOUNTER — Encounter (INDEPENDENT_AMBULATORY_CARE_PROVIDER_SITE_OTHER): Payer: Self-pay | Admitting: *Deleted

## 2014-10-12 ENCOUNTER — Encounter (INDEPENDENT_AMBULATORY_CARE_PROVIDER_SITE_OTHER): Payer: Self-pay | Admitting: *Deleted

## 2014-10-17 ENCOUNTER — Other Ambulatory Visit (INDEPENDENT_AMBULATORY_CARE_PROVIDER_SITE_OTHER): Payer: Self-pay | Admitting: *Deleted

## 2014-10-17 DIAGNOSIS — K922 Gastrointestinal hemorrhage, unspecified: Secondary | ICD-10-CM

## 2014-10-17 DIAGNOSIS — D509 Iron deficiency anemia, unspecified: Secondary | ICD-10-CM

## 2014-10-24 ENCOUNTER — Ambulatory Visit (HOSPITAL_COMMUNITY)
Admission: RE | Admit: 2014-10-24 | Discharge: 2014-10-24 | Disposition: A | Discharge status: Home / Self Care | Payer: Medicare Other | Source: Ambulatory Visit | Attending: Internal Medicine | Admitting: Internal Medicine

## 2014-10-24 ENCOUNTER — Encounter (HOSPITAL_COMMUNITY): Payer: Self-pay | Admitting: *Deleted

## 2014-10-24 ENCOUNTER — Encounter (HOSPITAL_COMMUNITY)
Admission: RE | Disposition: A | Discharge status: Home / Self Care | Payer: Self-pay | Source: Ambulatory Visit | Attending: Internal Medicine

## 2014-10-24 DIAGNOSIS — Z87891 Personal history of nicotine dependence: Secondary | ICD-10-CM | POA: Insufficient documentation

## 2014-10-24 DIAGNOSIS — R195 Other fecal abnormalities: Secondary | ICD-10-CM | POA: Insufficient documentation

## 2014-10-24 DIAGNOSIS — E785 Hyperlipidemia, unspecified: Secondary | ICD-10-CM | POA: Diagnosis not present

## 2014-10-24 DIAGNOSIS — Q2733 Arteriovenous malformation of digestive system vessel: Secondary | ICD-10-CM | POA: Diagnosis not present

## 2014-10-24 DIAGNOSIS — Z96651 Presence of right artificial knee joint: Secondary | ICD-10-CM | POA: Diagnosis not present

## 2014-10-24 DIAGNOSIS — K449 Diaphragmatic hernia without obstruction or gangrene: Secondary | ICD-10-CM | POA: Diagnosis not present

## 2014-10-24 DIAGNOSIS — K2970 Gastritis, unspecified, without bleeding: Secondary | ICD-10-CM | POA: Insufficient documentation

## 2014-10-24 DIAGNOSIS — Z79899 Other long term (current) drug therapy: Secondary | ICD-10-CM | POA: Diagnosis not present

## 2014-10-24 DIAGNOSIS — K219 Gastro-esophageal reflux disease without esophagitis: Secondary | ICD-10-CM | POA: Insufficient documentation

## 2014-10-24 DIAGNOSIS — D509 Iron deficiency anemia, unspecified: Secondary | ICD-10-CM | POA: Insufficient documentation

## 2014-10-24 DIAGNOSIS — K921 Melena: Secondary | ICD-10-CM

## 2014-10-24 DIAGNOSIS — Z7982 Long term (current) use of aspirin: Secondary | ICD-10-CM | POA: Diagnosis not present

## 2014-10-24 DIAGNOSIS — K2210 Ulcer of esophagus without bleeding: Secondary | ICD-10-CM | POA: Diagnosis not present

## 2014-10-24 DIAGNOSIS — I10 Essential (primary) hypertension: Secondary | ICD-10-CM | POA: Diagnosis not present

## 2014-10-24 DIAGNOSIS — I781 Nevus, non-neoplastic: Secondary | ICD-10-CM | POA: Diagnosis not present

## 2014-10-24 DIAGNOSIS — E119 Type 2 diabetes mellitus without complications: Secondary | ICD-10-CM | POA: Diagnosis not present

## 2014-10-24 DIAGNOSIS — K2950 Unspecified chronic gastritis without bleeding: Secondary | ICD-10-CM | POA: Diagnosis not present

## 2014-10-24 DIAGNOSIS — K922 Gastrointestinal hemorrhage, unspecified: Secondary | ICD-10-CM

## 2014-10-24 HISTORY — PX: ESOPHAGOGASTRODUODENOSCOPY: SHX5428

## 2014-10-24 HISTORY — PX: GIVENS CAPSULE STUDY: SHX5432

## 2014-10-24 LAB — GLUCOSE, CAPILLARY: GLUCOSE-CAPILLARY: 156 mg/dL — AB (ref 65–99)

## 2014-10-24 SURGERY — EGD (ESOPHAGOGASTRODUODENOSCOPY)
Anesthesia: Moderate Sedation

## 2014-10-24 MED ORDER — MIDAZOLAM HCL 5 MG/5ML IJ SOLN
INTRAMUSCULAR | Status: DC | PRN
  Administered 2014-10-24 (×3): 2 mg via INTRAVENOUS
  Administered 2014-10-24: 3 mg via INTRAVENOUS

## 2014-10-24 MED ORDER — NALOXONE HCL 0.4 MG/ML IJ SOLN
INTRAMUSCULAR | Status: AC
  Filled 2014-10-24: qty 1

## 2014-10-24 MED ORDER — BUTAMBEN-TETRACAINE-BENZOCAINE 2-2-14 % EX AERO
INHALATION_SPRAY | CUTANEOUS | Status: DC | PRN
  Administered 2014-10-24: 2 via TOPICAL

## 2014-10-24 MED ORDER — FLUMAZENIL 0.5 MG/5ML IV SOLN
INTRAVENOUS | Status: AC
  Filled 2014-10-24: qty 5

## 2014-10-24 MED ORDER — SODIUM CHLORIDE 0.9 % IV SOLN: INTRAVENOUS | Status: DC

## 2014-10-24 MED ORDER — EPINEPHRINE HCL 0.1 MG/ML IJ SOSY
PREFILLED_SYRINGE | INTRAMUSCULAR | Status: AC
  Filled 2014-10-24: qty 10

## 2014-10-24 MED ORDER — PROMETHAZINE HCL 25 MG/ML IJ SOLN
INTRAMUSCULAR | Status: AC
  Filled 2014-10-24: qty 1

## 2014-10-24 MED ORDER — ATROPINE SULFATE 1 MG/ML IJ SOLN
INTRAMUSCULAR | Status: AC
  Filled 2014-10-24: qty 1

## 2014-10-24 MED ORDER — MIDAZOLAM HCL 5 MG/5ML IJ SOLN
INTRAMUSCULAR | Status: AC
  Filled 2014-10-24: qty 10

## 2014-10-24 MED ORDER — MEPERIDINE HCL 50 MG/ML IJ SOLN
INTRAMUSCULAR | Status: AC
  Filled 2014-10-24: qty 1

## 2014-10-24 MED ORDER — MEPERIDINE HCL 50 MG/ML IJ SOLN
INTRAMUSCULAR | Status: DC | PRN
  Administered 2014-10-24 (×2): 25 mg via INTRAVENOUS

## 2014-10-24 NOTE — Op Note (Signed)
EGD PROCEDURE REPORT  PATIENT:  Debra Hopkins  MR#:  [id number] Birthdate:  [birth year], 74 y.o., female Endoscopist:  Dr. Rogene Houston, MD Referred By:  Dr. Audree Camel, MD  Procedure Date: 10/24/2014  Procedure:   EGD with endoscopic placement of given capsule.  Indications:  Patient is 74 year old Caucasian female who has chronic GERD and maintain PPI who underwent colonoscopy about 5 weeks ago for change in her bowel habits and now returns with drop in her H&H as well as iron studies showing iron deficiency anemia and she's also heme-positive stool. She is therefore undergoing EGD. If no bleeding lesion is found in upper GI tract given capsule could be placed into duodenum endoscopically.            Informed Consent:  The risks, benefits, alternatives & imponderables which include, but are not limited to, bleeding, infection, perforation, drug reaction and potential missed lesion have been reviewed.  The potential for biopsy, lesion removal, esophageal dilation, etc. have also been discussed.  Questions have been answered.  All parties agreeable.  Please see history & physical in medical record for more information.  Medications:  Demerol 50 mg IV Versed 9 mg IV Cetacaine spray topically for oropharyngeal anesthesia  Description of procedure:  The endoscope was introduced through the mouth and advanced to the second portion of the duodenum without difficulty or limitations. The mucosal surfaces were surveyed very carefully during advancement of the scope and upon withdrawal.  Findings:  Esophagus:  Mucosa of the esophagus was normal. Single erosion noted at GE junction. GEJ:  36 cm Hiatus:  39 cm Stomach:  Stomach was empty and distended very well with insufflation. Folds in the proximal stomach were normal. Examination of mucosa at gastric body revealed few small hyperplastic appearing polyps. Patchy linear erythema noted to antral mucosa without features of telangiectasia. Pyloric  channel was patent. Angularis fundus and cardia were examined by retroflexion of scope and were normal.  Duodenum:  Normal bulbar and post bulbar mucosa.  Therapeutic/Diagnostic Maneuvers Performed:   Given capsule was placed endoscopically into the duodenal bulb.  Complications:  None  Impression: Small sliding hiatal hernia with single erosion and GE junction. Nonerosive antral gastritis.  Recommendations:  Standard instructions given. I will be contacting patient with also given capsule study later this week.  Raniah Karan U  10/24/2014  8:23 AM  CC: Dr. Monico Blitz, MD & Dr. Rayne Du ref. provider found

## 2014-10-24 NOTE — H&P (Signed)
Debra Hopkins is an 74 y.o. female.   Chief Complaint: Patient is here for EGD and possible given capsule study(if EGD reveals no evidence of GI blood loss). HPI: Patient is 74 year old Caucasian female with multiple medical problems who presents with drop in H&H lab studies consistent with iron deficiency anemia. She was also noted to have heme-positive stool. She was seen by Dr.Darovsky and received iron infusion less than 2 weeks ago. Another one is scheduled later this week. She says she's been getting weaker and weaker. She denies melena or rectal bleeding abdominal pain nausea vomiting or dysphagia. She says heartburns well controlled PPI. She has good appetite her weight stable. She is on low-dose aspirin but does not take other OTC NSAIDs. She had colonoscopy but 5 weeks ago with removal of single small polyp.  Last EGD was in July 2015 revealing small sliding hiatal hernia and Schatzki's ring and esophagus was dilated by passing 54 [place] Maloney dilator and ring was further disrupted with cold biopsy.    Past Medical History  Diagnosis Date  . Unspecified essential hypertension   . Other and unspecified hyperlipidemia   . PVD (peripheral vascular disease)   . Dyspnea   . GERD (gastroesophageal reflux disease)   . Arthritis   . Diabetes mellitus     Type 2  . Fibromyalgia   . TIA (transient ischemic attack)   . Cataracts, bilateral   . Varicose veins   . Impaired hearing   . History of shingles   . CAD (coronary artery disease)   . Hemorrhoids   . Diverticulitis   . DDD (degenerative disc disease)   . Macular degeneration   . Carotid artery occlusion     Past Surgical History  Procedure Laterality Date  . Breast biopsy    . Kidney stone surgery  1988  . Aspiration of a cyst in the right breast    . Appendectomy    . Cardiac catheterization    . Spine surgery  1997    C5-c6 fusion  . Knee arthroscopy  2009  . Joint replacement  2011    Right total knee replacement   . Total abdominal hysterectomy  1973  . Carotid endarterectomy  12-03-06    left CEA  . Angioplasty      left leg 2003  . Esophagogastroduodenoscopy N/A 10/01/2013    Procedure: ESOPHAGOGASTRODUODENOSCOPY (EGD);  Surgeon: Rogene Houston, MD;  Location: [facility];  Service: Endoscopy;  Laterality: N/A;  120  . Balloon dilation N/A 10/01/2013    Procedure: BALLOON DILATION;  Surgeon: Rogene Houston, MD;  Location: [facility];  Service: Endoscopy;  Laterality: N/A;  Venia Minks dilation N/A 10/01/2013    Procedure: Venia Minks DILATION;  Surgeon: Rogene Houston, MD;  Location: [facility];  Service: Endoscopy;  Laterality: N/A;  . Savory dilation N/A 10/01/2013    Procedure: SAVORY DILATION;  Surgeon: Rogene Houston, MD;  Location: [facility];  Service: Endoscopy;  Laterality: N/A;  . Cataract extraction w/phaco Right 10/14/2013    Procedure: CATARACT EXTRACTION PHACO AND INTRAOCULAR LENS PLACEMENT (IOC);  Surgeon: Tonny Branch, MD;  Location: [facility];  Service: Ophthalmology;  Laterality: Right;  CDE 7.20  . Cataract extraction w/phaco Left 11/04/2013    Procedure: CATARACT EXTRACTION PHACO AND INTRAOCULAR LENS PLACEMENT (IOC);  Surgeon: Tonny Branch, MD;  Location: [facility];  Service: Ophthalmology;  Laterality: Left;  CDE 8.57  . Colonoscopy N/A 09/19/2014    Procedure: COLONOSCOPY;  Surgeon: Rogene Houston, MD;  Location: [facility];  Service: Endoscopy;  Laterality: N/A;  7:30-8:30    Family History  Problem Relation Age of Onset  . Coronary artery disease Other   . Stroke Mother   . Hypertension Mother   . Stroke Father   . Hypertension Father   . Diabetes Father   . Coronary artery disease Father   . Diabetes Sister   . Hypertension Sister   . Lung cancer Brother   . Sinusitis Brother   . Healthy Brother   . Diabetes Son   . Hypertension Son    Social History:  reports that she quit smoking about 29 years ago. Her smoking use included Cigarettes. She has never used smokeless  tobacco. She reports that she does not drink alcohol or use illicit drugs.  Allergies:  Allergies  Allergen Reactions  . Amoxicillin-Pot Clavulanate     REACTION: hives  . Amoxicillin-Pot Clavulanate Hives  . Ciprofloxacin Nausea Only    Dizziness  . Erythromycin     REACTION: Upset stomach  . Motrin [Ibuprofen]     REACTION: nausea  . Naproxen     REACTION: severe nausea  . Prednisone     REACTION: itchy rash  . Sulfamethoxazole-Trimethoprim     REACTION: Upset stomach    Medications Prior to Admission  Medication Sig Dispense Refill  . aspirin (ECOTRIN LOW STRENGTH) 81 MG EC tablet Take 81 mg by mouth daily. Swallow whole.    . cloNIDine (CATAPRES) 0.1 MG tablet Take 0.1 mg by mouth 3 (three) times daily.    Marland Kitchen co-enzyme Q-10 30 MG capsule Take 100 mg by mouth daily.    Marland Kitchen esomeprazole (NEXIUM) 40 MG capsule Take 40 mg by mouth daily before breakfast.    . FLUZONE HIGH-DOSE 0.5 ML SUSY   0  . Glucosamine-Chondroitin (OSTEO BI-FLEX REGULAR STRENGTH PO) Take 1 tablet by mouth daily.      . hydrALAZINE (APRESOLINE) 25 MG tablet Take 25 mg by mouth daily.    Marland Kitchen levothyroxine (SYNTHROID, LEVOTHROID) 50 MCG tablet Take 50 mcg by mouth daily before breakfast.    . losartan (COZAAR) 100 MG tablet Take 100 mg by mouth daily.      . metFORMIN (GLUCOPHAGE) 500 MG tablet Take 500 mg by mouth 2 (two) times daily with a meal.    . methylcellulose (CITRUCEL) oral powder Take by mouth. 1 TBSP Twice Weekly     . Multiple Vitamins-Minerals (PRESERVISION AREDS 2 PO) Take 2 tablets by mouth daily.    Marland Kitchen NIFEdipine (PROCARDIA-XL/ADALAT-CC/NIFEDICAL-XL) 30 MG 24 hr tablet Take 30 mg by mouth daily.    . Omega-3 Fatty Acids (FISH OIL) 1000 MG CAPS Take 1,100 mg by mouth daily.     Marland Kitchen OVER THE COUNTER MEDICATION Rainbow light for women's one a day- Patient taked 1 by mouth daily    . oxyCODONE-acetaminophen (PERCOCET/ROXICET) 5-325 MG per tablet Take 0.5 tablets by mouth every 4 (four) hours as needed  for moderate pain or severe pain.     . simvastatin (ZOCOR) 20 MG tablet Take 10 mg by mouth at bedtime.     . torsemide (DEMADEX) 20 MG tablet Take 10-20 mg by mouth daily. 20 mg in the morning and 10 mg at lunch.    . traZODone (DESYREL) 50 MG tablet Take 100 mg by mouth at bedtime.       Results for orders placed or performed during the hospital encounter of 10/24/14 (from the past 48  hour(s))  Glucose, capillary     Status: Abnormal   Collection Time: 10/24/14  7:03 AM  Result Value Ref Range   Glucose-Capillary 156 (H) 65 - 99 mg/dL   No results found.  ROS  Blood pressure 139/56, pulse 66, temperature 97.7 F (36.5 C), temperature source Oral, resp. rate 15, height 5\' 3"  (1.6 m), weight 200 lb (90.719 kg), SpO2 100 %. Physical Exam  Constitutional: She appears well-developed and well-nourished.  HENT:  Mouth/Throat: Oropharynx is clear and moist.  Eyes: Conjunctivae are normal. No scleral icterus.  Neck: No thyromegaly present.  Cardiovascular: Normal rate, regular rhythm and normal heart sounds.   No murmur heard. Respiratory: Effort normal and breath sounds normal.  GI: Soft. She exhibits no distension and no mass. There is no tenderness.  Musculoskeletal: She exhibits no edema.  Lymphadenopathy:    She has no cervical adenopathy.  Neurological: She is alert.  Skin: Skin is warm and dry.     Assessment/Plan Iron deficiency anemia and heme positive stool. No leading lesion found on colonoscopy. 5 weeks ago. Chronic GERD. Diagnostic esophagogastroduodenoscopy and followed by given capsule study if needed.  Lavontay Kirk U 10/24/2014, 7:38 AM

## 2014-10-24 NOTE — Discharge Instructions (Signed)
Clear liquids at 10:30 AM. Half a sandwich of 12:30 PM and resume usual diet starting 4:30 PM Resume usual medications. No driving for 24 hours. Physician will call with results of given capsule study tomorrow.  Gastrointestinal Endoscopy, Care After Refer to this sheet in the next few weeks. These instructions provide you with information on caring for yourself after your procedure. Your caregiver may also give you more specific instructions. Your treatment has been planned according to current medical practices, but problems sometimes occur. Call your caregiver if you have any problems or questions after your procedure. HOME CARE INSTRUCTIONS  If you were given medicine to help you relax (sedative), do not drive, operate machinery, or sign important documents for 24 hours.  Avoid alcohol and hot or warm beverages for the first 24 hours after the procedure.  Only take over-the-counter or prescription medicines for pain, discomfort, or fever as directed by your caregiver. You may resume taking your normal medicines unless your caregiver tells you otherwise. Ask your caregiver when you may resume taking medicines that may cause bleeding, such as aspirin, clopidogrel, or warfarin.  You may return to your normal diet and activities on the day after your procedure, or as directed by your caregiver. Walking may help to reduce any bloated feeling in your abdomen.  Drink enough fluids to keep your urine clear or pale yellow.  You may gargle with salt water if you have a sore throat. SEEK IMMEDIATE MEDICAL CARE IF:  You have severe nausea or vomiting.  You have severe abdominal pain, abdominal cramps that last longer than 6 hours, or abdominal swelling (distention).  You have severe shoulder or back pain.  You have trouble swallowing.  You have shortness of breath, your breathing is shallow, or you are breathing faster than normal.  You have a fever or a rapid heartbeat.  You vomit blood or  material that looks like coffee grounds.  You have bloody, black, or tarry stools. MAKE SURE YOU:  Understand these instructions.  Will watch your condition.  Will get help right away if you are not doing well or get worse. Document Released: 10/10/2003 Document Revised: 07/12/2013 Document Reviewed: 05/28/2011 [facility] Patient Information 2015 [place], [place]. This information is not intended to replace advice given to you by your health care provider. Make sure you discuss any questions you have with your health care provider.  Hiatal Hernia A hiatal hernia occurs when part of your stomach slides above the muscle that separates your abdomen from your chest (diaphragm). You can be born with a hiatal hernia (congenital), or it may develop over time. In almost all cases of hiatal hernia, only the top part of the stomach pushes through.  Many people have a hiatal hernia with no symptoms. The larger the hernia, the more likely that you will have symptoms. In some cases, a hiatal hernia allows stomach acid to flow back into the tube that carries food from your mouth to your stomach (esophagus). This may cause heartburn symptoms. Severe heartburn symptoms may mean you have developed a condition called gastroesophageal reflux disease (GERD).  CAUSES  Hiatal hernias are caused by a weakness in the opening (hiatus) where your esophagus passes through your diaphragm to attach to the upper part of your stomach. You may be born with a weakness in your hiatus, or a weakness can develop. RISK FACTORS Older age is a major risk factor for a hiatal hernia. Anything that increases pressure on your diaphragm can also increase your risk  of a hiatal hernia. This includes:  Pregnancy.  Excess weight.  Frequent constipation. SIGNS AND SYMPTOMS  People with a hiatal hernia often have no symptoms. If symptoms develop, they are almost always caused by GERD. They may  include:  Heartburn.  Belching.  Indigestion.  Trouble swallowing.  Coughing or wheezing.  Sore throat.  Hoarseness.  Chest pain. DIAGNOSIS  A hiatal hernia is sometimes found during an exam for another problem. Your health care provider may suspect a hiatal hernia if you have symptoms of GERD. Tests may be done to diagnose GERD. These may include:  X-rays of your stomach or chest.  An upper gastrointestinal (GI) series. This is an X-ray exam of your GI tract involving the use of a chalky liquid that you swallow. The liquid shows up clearly on the X-ray.  Endoscopy. This is a procedure to look into your stomach using a thin, flexible tube that has a tiny camera and light on the end of it. TREATMENT  If you have no symptoms, you may not need treatment. If you have symptoms, treatment may include:  Dietary and lifestyle changes to help reduce GERD symptoms.  Medicines. These may include:  Over-the-counter antacids.  Medicines that make your stomach empty more quickly.  Medicines that block the production of stomach acid (H2 blockers).  Stronger medicines to reduce stomach acid (proton pump inhibitors).  You may need surgery to repair the hernia if other treatments are not helping. HOME CARE INSTRUCTIONS   Take all medicines as directed by your health care provider.  Quit smoking, if you smoke.  Try to achieve and maintain a healthy body weight.  Eat frequent small meals instead of three large meals a day. This keeps your stomach from getting too full.  Eat slowly.  Do not lie down right after eating.  Do noteat 1-2 hours before bed.   Do not drink beverages with caffeine. These include cola, coffee, cocoa, and tea.  Do not drink alcohol.  Avoid foods that can make symptoms of GERD worse. These may include:  Fatty foods.  Citrus fruits.  Other foods and drinks that contain acid.  Avoid putting pressure on your belly. Anything that puts pressure on  your belly increases the amount of acid that may be pushed up into your esophagus.   Avoid bending over, especially after eating.  Raise the head of your bed by putting blocks under the legs. This keeps your head and esophagus higher than your stomach.  Do not wear tight clothing around your chest or stomach.  Try not to strain when having a bowel movement, when urinating, or when lifting heavy objects. SEEK MEDICAL CARE IF:  Your symptoms are not controlled with medicines or lifestyle changes.  You are having trouble swallowing.  You have coughing or wheezing that will not go away. SEEK IMMEDIATE MEDICAL CARE IF:  Your pain is getting worse.  Your pain spreads to your arms, neck, jaw, teeth, or back.  You have shortness of breath.  You sweat for no reason.  You feel sick to your stomach (nauseous) or vomit.  You vomit blood.  You have bright red blood in your stools.  You have black, tarry stools.  Document Released: 05/18/2003 Document Revised: 07/12/2013 Document Reviewed: 02/12/2013 [facility] Patient Information 2015 [employer], [place]. This information is not intended to replace advice given to you by your health care provider. Make sure you discuss any questions you have with your health care provider.

## 2014-10-27 DIAGNOSIS — K5520 Angiodysplasia of colon without hemorrhage: Secondary | ICD-10-CM | POA: Diagnosis not present

## 2014-10-27 DIAGNOSIS — K31819 Angiodysplasia of stomach and duodenum without bleeding: Secondary | ICD-10-CM | POA: Diagnosis not present

## 2014-10-27 DIAGNOSIS — D509 Iron deficiency anemia, unspecified: Secondary | ICD-10-CM | POA: Diagnosis not present

## 2014-10-27 DIAGNOSIS — K921 Melena: Secondary | ICD-10-CM | POA: Diagnosis not present

## 2014-10-27 NOTE — Op Note (Signed)
Small Bowel Givens Capsule Study Procedure date:  10/24/2014  Referring Provider:  Audree Camel, MD PCP:  Dr. Monico Blitz, MD  Indication for procedure:  Patient is 74 year old Caucasian female who underwent colonoscopy on 09/19/2014 for change in bowel habits and lower abdominal pain. Her stool was guaiac negative and iron studies were normal. She presented to Dr. Jacquiline Doe with drop in her H&H, heme positive stool and iron studies consistent with iron deficiency anemia. She has received parenteral iron infusion. She underwent esophagogastroduodenoscopy and no bleeding lesion was found in upper GI tract. Therefore given capsule was placed endoscopically into duodenum to complete her workup.    Findings:   Given capsule was dropped into duodenal bulb. On subsequent images refluxed back into the stomach for about 7 minutes and then passed again into duodenum. 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 Fresh blood noted on images at 1:18:07, 1:18:32 and 1:19:48. 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      First Gastric image:  Not available as given capsule placed into duodenum endoscopically . First Duodenal image: 1 min and 35 sec  First Ileo-Cecal Valve image: 5 hr 42 min and 36 sec  First Cecal image: 5 hr 42 min and 36 sec  Gastric Passage time: Cannot be calculated. 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 Small amount of fresh blood noted coating jejunal mucosa. 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 No ulcers polyps or masses identified. Study results reviewed with patient over the phone. If H&H cannot be maintained above 10 g despite parenteral iron would consider referral to [facility] for small bowel endoscopy.

## 2014-10-28 ENCOUNTER — Encounter (HOSPITAL_COMMUNITY): Payer: Self-pay | Admitting: Internal Medicine

## 2015-02-21 ENCOUNTER — Ambulatory Visit (INDEPENDENT_AMBULATORY_CARE_PROVIDER_SITE_OTHER): Payer: Medicare Other | Admitting: Internal Medicine

## 2015-02-21 ENCOUNTER — Encounter (INDEPENDENT_AMBULATORY_CARE_PROVIDER_SITE_OTHER): Payer: Self-pay | Admitting: Internal Medicine

## 2015-02-21 ENCOUNTER — Encounter (INDEPENDENT_AMBULATORY_CARE_PROVIDER_SITE_OTHER): Payer: Self-pay

## 2015-02-21 VITALS — BP 120/62 | HR 66 | Temp 98.2°F | Ht 63.0 in | Wt 208.8 lb

## 2015-02-21 DIAGNOSIS — D649 Anemia, unspecified: Secondary | ICD-10-CM | POA: Insufficient documentation

## 2015-02-21 DIAGNOSIS — D500 Iron deficiency anemia secondary to blood loss (chronic): Secondary | ICD-10-CM

## 2015-02-21 NOTE — Progress Notes (Addendum)
Subjective:    Patient ID: Debra Hopkins, female    DOB: 31-May-1940, 74 y.o.   MRN: [id number]  HPI Here today for f/u for progressive anemia. She underwent a colonoscopy which did not reveal source of bleeding. Long hx of anemia.  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 There was fresh blood noted on images.  She has had 2 iron infusions and is scheduled for another iron infusion in January  2017 in [place] at the [facility]. She tells me she is doing good. She hurts all over from arthritis and fibromyalgia. Her appetite is okay. No weight loss. She has a BM daily. She says her BMs look like mustard. No melena or BRRB.    02/10/2015 H andH  9.5 and 27.9, "MCV 93.3, platelet ct 462., Iron 37, TIBC 254, $ Saturation 15.     07/30/2014 H and H 9.7 and 28.9. MCV 91. ,ferritin 152, T4 1.52      10/24/2014: EGD with endoscopic placement of given capsule.  Indications: Patient is 74 year old Caucasian female who has chronic GERD and maintain PPI who underwent colonoscopy about 5 weeks ago for change in her bowel habits and now returns with drop in her H&H as well as iron studies showing iron deficiency anemia and she's also heme-positive stool. She is therefore undergoing EGD. If no bleeding lesion is found in upper GI tract given capsule could be placed into duodenum endoscopically.  Impression: Small sliding hiatal hernia with single erosion and GE junction. Nonerosive antral gastritis.   Findings:  Given capsule was dropped into duodenal bulb. On subsequent images refluxed back into the stomach for about 7 minutes and then passed again into duodenum. 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 Fresh blood noted on images at 1:18:07, 1:18:32 and 1:19:48. 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 If H&H cannot be maintained above 10 g despite parenteral iron would consider referral to [facility] for small bowel endoscopy.  09/19/2014: Colonoscopy  Indications: Patient is 74 year old Caucasian female who presents with few months history of lower abdominal pain change in caliber for stools and anemia concerning for GI blood loss although it has not been documented. He had single episode of melena. Patient's last EGD was in July 2015 and colonoscopy was in 2011. Patient is undergoing diagnostic colonoscopy.  Impression:  Examination performed to cecum. 6 mm polyp cold snared from sigmoid colon in two pieces. Smaller piece was lost. Mild sigmoid colon diverticulosis.      Colonoscopy was in 2011 which revealed a few small diverticula at sigmoid colon, external hemorrhoids, otherwise normal examination .  10/01/2013 EGD/ED:  Indications: Patient is 74 year old Caucasian female with chronic GERD and now presents with a four-month history of dysphagia. She also has cough that would not go away. Impression: Prominent inter arytenoid fold. Small sliding hiatal hernia with noncritical Schatzki's ring. Esophagus dilated by passing 40 French Maloney dilator ring was intact and disrupted with focal biopsy three sites      CBC    Component Value Date/Time   WBC 6.3 08/22/2014 0934   RBC 3.23* 08/22/2014 0934   RBC 3.23* 08/22/2014 0934   HGB 9.9* 08/22/2014 0934   HCT 29.4* 08/22/2014 0934   PLT 468* 08/22/2014 0934   MCV 91.0 08/22/2014 0934   MCH 30.7 08/22/2014 0934   MCHC 33.7 08/22/2014 0934   RDW  16.5* 08/22/2014 0934   LYMPHSABS 1.5 08/22/2014 0934   MONOABS 0.6 08/22/2014 0934   EOSABS 0.1 08/22/2014 0934   BASOSABS 0.0 08/22/2014 0934       Review of Systems Past Medical  History  Diagnosis Date  . Unspecified essential hypertension   . Other and unspecified hyperlipidemia   . PVD (peripheral vascular disease) ([place])   . Dyspnea   . GERD (gastroesophageal reflux disease)   . Arthritis   . Diabetes mellitus     Type 2  . Fibromyalgia   . TIA (transient ischemic attack)   . Cataracts, bilateral   . Varicose veins   . Impaired hearing   . History of shingles   . CAD (coronary artery disease)   . Hemorrhoids   . Diverticulitis   . DDD (degenerative disc disease)   . Macular degeneration   . Carotid artery occlusion     Past Surgical History  Procedure Laterality Date  . Breast biopsy    . Kidney stone surgery  1988  . Aspiration of a cyst in the right breast    . Appendectomy    . Cardiac catheterization    . Spine surgery  1997    C5-c6 fusion  . Knee arthroscopy  2009  . Joint replacement  2011    Right total knee replacement  . Total abdominal hysterectomy  1973  . Carotid endarterectomy  12-03-06    left CEA  . Angioplasty      left leg 2003  . Esophagogastroduodenoscopy N/A 10/01/2013    Procedure: ESOPHAGOGASTRODUODENOSCOPY (EGD);  Surgeon: Rogene Houston, MD;  [facility];  Service: Endoscopy;  Laterality: N/A;  120  . Balloon dilation N/A 10/01/2013    Procedure: BALLOON DILATION;  Surgeon: Rogene Houston, MD;  [facility];  Service: Endoscopy;  Laterality: N/A;  Venia Minks dilation N/A 10/01/2013    Procedure: Venia Minks DILATION;  Surgeon: Rogene Houston, MD;  [facility];  Service: Endoscopy;  Laterality: N/A;  . Savory dilation N/A 10/01/2013    Procedure: SAVORY DILATION;  Surgeon: Rogene Houston, MD;  [facility];  Service: Endoscopy;  Laterality: N/A;  . Cataract extraction w/phaco Right 10/14/2013    Procedure: CATARACT EXTRACTION PHACO AND INTRAOCULAR LENS PLACEMENT (IOC);  Surgeon: Tonny Branch, MD;  Location: [facility];  Service: Ophthalmology;  Laterality: Right;  CDE 7.20  .  Cataract extraction w/phaco Left 11/04/2013    Procedure: CATARACT EXTRACTION PHACO AND INTRAOCULAR LENS PLACEMENT (IOC);  Surgeon: Tonny Branch, MD;  Location: [facility];  Service: Ophthalmology;  Laterality: Left;  CDE 8.57  . Colonoscopy N/A 09/19/2014    Procedure: COLONOSCOPY;  Surgeon: Rogene Houston, MD;  [facility];  Service: Endoscopy;  Laterality: N/A;  7:30-8:30  . Esophagogastroduodenoscopy N/A 10/24/2014    Procedure: ESOPHAGOGASTRODUODENOSCOPY (EGD);  Surgeon: Rogene Houston, MD;  [facility];  Service: Endoscopy;  Laterality: N/A;  730  . Givens capsule study N/A 10/24/2014    Procedure: GIVENS CAPSULE STUDY;  Surgeon: Rogene Houston, MD;  [facility];  Service: Endoscopy;  Laterality: N/A;    Allergies  Allergen Reactions  . Amoxicillin-Pot Clavulanate     REACTION: hives  . Amoxicillin-Pot Clavulanate Hives  . Ciprofloxacin Nausea Only    Dizziness  . Erythromycin     REACTION: Upset stomach  . Motrin [Ibuprofen]     REACTION: nausea  . Naproxen     REACTION:  severe nausea  . Prednisone     REACTION: itchy rash  . Sulfamethoxazole-Trimethoprim     REACTION: Upset stomach    Current Outpatient Prescriptions on File Prior to Visit  Medication Sig Dispense Refill  . aspirin (ECOTRIN LOW STRENGTH) 81 MG EC tablet Take 81 mg by mouth daily. Swallow whole.    . cloNIDine (CATAPRES) 0.1 MG tablet Take 0.1 mg by mouth 3 (three) times daily.    Marland Kitchen co-enzyme Q-10 30 MG capsule Take 100 mg by mouth daily.    Marland Kitchen esomeprazole (NEXIUM) 40 MG capsule Take 40 mg by mouth daily before breakfast.    . Glucosamine-Chondroitin (OSTEO BI-FLEX REGULAR STRENGTH PO) Take 1 tablet by mouth daily.      . hydrALAZINE (APRESOLINE) 25 MG tablet Take 25 mg by mouth daily.    Marland Kitchen levothyroxine (SYNTHROID, LEVOTHROID) 50 MCG tablet Take 50 mcg by mouth daily before breakfast.    . losartan (COZAAR) 100 MG tablet Take 100 mg by mouth daily.      . metFORMIN  (GLUCOPHAGE) 500 MG tablet Take 500 mg by mouth 2 (two) times daily with a meal.    . methylcellulose (CITRUCEL) oral powder Take by mouth. 1 TBSP Twice Weekly     . Multiple Vitamins-Minerals (PRESERVISION AREDS 2 PO) Take 2 tablets by mouth daily.    Marland Kitchen NIFEdipine (PROCARDIA-XL/ADALAT-CC/NIFEDICAL-XL) 30 MG 24 hr tablet Take 30 mg by mouth daily.    . Omega-3 Fatty Acids (FISH OIL) 1000 MG CAPS Take 1,100 mg by mouth daily.     Marland Kitchen OVER THE COUNTER MEDICATION Rainbow light for women's one a day- Patient taked 1 by mouth daily    . oxyCODONE-acetaminophen (PERCOCET/ROXICET) 5-325 MG per tablet Take 0.5 tablets by mouth every 4 (four) hours as needed for moderate pain or severe pain.     . simvastatin (ZOCOR) 20 MG tablet Take 10 mg by mouth at bedtime.     . torsemide (DEMADEX) 20 MG tablet Take 10-20 mg by mouth daily. 20 mg in the morning and 10 mg at lunch.    . traZODone (DESYREL) 50 MG tablet Take 100 mg by mouth at bedtime.     Marland Kitchen FLUZONE HIGH-DOSE 0.5 ML SUSY   0   No current facility-administered medications on file prior to visit.        Objective:   Physical Exam.Blood pressure 120/62, pulse 66, temperature 98.2 F (36.8 C), height 5\' 3"  (1.6 m), weight 208 lb 12.8 oz (94.711 kg). Alert and oriented. Skin warm and dry. Oral mucosa is moist.   . Sclera anicteric, conjunctivae is pink. Thyroid not enlarged. No cervical lymphadenopathy. Lungs clear. Heart regular rate and rhythm.  Abdomen is soft. Bowel sounds are positive. No hepatomegaly. No abdominal masses felt. No tenderness.  No edema to lower extremities.          Assessment & Plan:  Anemia. ? Bleeding in small bowel. CBC 1 week after next iron infusion which will be January 10. I discussed with Dr. Laural Golden. If hemoglobin below 10 will refer to [facility] for small bowel endoscopy.

## 2015-02-21 NOTE — Patient Instructions (Addendum)
CBC after Iron infusion. If below 10 will refer to [facility]

## 2015-02-23 ENCOUNTER — Telehealth (INDEPENDENT_AMBULATORY_CARE_PROVIDER_SITE_OTHER): Payer: Self-pay | Admitting: *Deleted

## 2015-02-23 DIAGNOSIS — D509 Iron deficiency anemia, unspecified: Secondary | ICD-10-CM

## 2015-02-23 NOTE — Telephone Encounter (Signed)
.  Per Debra Hopkins patient is to have labs 03/21/15.

## 2015-02-27 ENCOUNTER — Other Ambulatory Visit (INDEPENDENT_AMBULATORY_CARE_PROVIDER_SITE_OTHER): Payer: Self-pay | Admitting: *Deleted

## 2015-02-27 ENCOUNTER — Encounter (INDEPENDENT_AMBULATORY_CARE_PROVIDER_SITE_OTHER): Payer: Self-pay | Admitting: *Deleted

## 2015-02-27 DIAGNOSIS — D509 Iron deficiency anemia, unspecified: Secondary | ICD-10-CM

## 2015-03-03 ENCOUNTER — Telehealth: Payer: Self-pay | Admitting: Vascular Surgery

## 2015-03-03 NOTE — Telephone Encounter (Signed)
Patient called to inquire about appointment times and billing for her January 2017 appointment. I have explained the split billing for the ultrasounds that are considered outpatient radiology to Debra Hopkins and she voiced understanding that the billing has 3 components for the facility and the reading of the study as well as the office visit.

## 2015-03-15 ENCOUNTER — Telehealth (INDEPENDENT_AMBULATORY_CARE_PROVIDER_SITE_OTHER): Payer: Self-pay | Admitting: Internal Medicine

## 2015-03-15 NOTE — Telephone Encounter (Signed)
Debra Hopkins called saying she had iron fusion on 1.3.17 and will have it again on 1.10.17. She wants to know if she should wait until after the 1.10.17 infusion appt to have her labs. Please give her a call regarding this.  Pt's ph# [phone] Thank you.

## 2015-03-15 NOTE — Telephone Encounter (Signed)
Patient was called and made aware that she may wait and have lab done after next infusion.

## 2015-03-24 ENCOUNTER — Encounter: Payer: Self-pay | Admitting: Family

## 2015-03-28 ENCOUNTER — Ambulatory Visit (HOSPITAL_COMMUNITY)
Admission: RE | Admit: 2015-03-28 | Discharge: 2015-03-28 | Disposition: A | Discharge status: Home / Self Care | Payer: Medicare Other | Source: Ambulatory Visit | Attending: Family | Admitting: Family

## 2015-03-28 ENCOUNTER — Encounter: Payer: Self-pay | Admitting: Family

## 2015-03-28 ENCOUNTER — Ambulatory Visit (INDEPENDENT_AMBULATORY_CARE_PROVIDER_SITE_OTHER)
Admission: RE | Admit: 2015-03-28 | Discharge: 2015-03-28 | Disposition: A | Discharge status: Home / Self Care | Payer: Medicare Other | Source: Ambulatory Visit | Attending: Family | Admitting: Family

## 2015-03-28 ENCOUNTER — Ambulatory Visit (INDEPENDENT_AMBULATORY_CARE_PROVIDER_SITE_OTHER): Payer: Medicare Other | Admitting: Family

## 2015-03-28 VITALS — BP 129/63 | HR 63 | Ht 63.0 in | Wt 208.1 lb

## 2015-03-28 DIAGNOSIS — E785 Hyperlipidemia, unspecified: Secondary | ICD-10-CM | POA: Insufficient documentation

## 2015-03-28 DIAGNOSIS — Z48812 Encounter for surgical aftercare following surgery on the circulatory system: Secondary | ICD-10-CM

## 2015-03-28 DIAGNOSIS — I6523 Occlusion and stenosis of bilateral carotid arteries: Secondary | ICD-10-CM | POA: Diagnosis not present

## 2015-03-28 DIAGNOSIS — I6521 Occlusion and stenosis of right carotid artery: Secondary | ICD-10-CM | POA: Insufficient documentation

## 2015-03-28 DIAGNOSIS — Z9889 Other specified postprocedural states: Secondary | ICD-10-CM | POA: Diagnosis not present

## 2015-03-28 DIAGNOSIS — I739 Peripheral vascular disease, unspecified: Secondary | ICD-10-CM

## 2015-03-28 DIAGNOSIS — Z87891 Personal history of nicotine dependence: Secondary | ICD-10-CM | POA: Diagnosis not present

## 2015-03-28 DIAGNOSIS — E119 Type 2 diabetes mellitus without complications: Secondary | ICD-10-CM | POA: Insufficient documentation

## 2015-03-28 DIAGNOSIS — I10 Essential (primary) hypertension: Secondary | ICD-10-CM | POA: Diagnosis not present

## 2015-03-28 DIAGNOSIS — Z959 Presence of cardiac and vascular implant and graft, unspecified: Secondary | ICD-10-CM

## 2015-03-28 NOTE — Progress Notes (Signed)
Chief Complaint: Extracranial Carotid Artery Stenosis   History of Present Illness  Debra Hopkins is a 75 y.o. female patient of Dr. Kellie Simmering who is status post left CEA in September 2008, left SFA PTA with stent 2003. She returns today for surveillance. Cramping in left calf after walking about 1/2 block, no change in claudication symptoms in the last few years; denies non-healing wounds, denies rest pain, reports mild right calf claudication is minor. Pt admits to not walking as much due to claudication and painful arthritis in her feet at times., lack of padding in the soles of her feet  States her last TIA was about 1995 as manifested by feeling dizzy and nauseated for about 15 minutes; she was then told by a medical provider that she had TIA's. She did not have hemiplegia, monocular blindness, facial drooping, or aphasia. Her DM is in good control, she stopped smoking in the 1990's, she takes a daily ASA and a statin, she is obese. She rarely drinks ETOH.  On endoscopy it was found she has a hiatal hernia, diverticulosis; a small amount of fresh blood was found in the small intestine; she is anemic, had iron infusions.   Pt Diabetic: Yes, states in good control Pt smoker: former smoker, quit in the 1990's  Pt meds include: Statin :Yes ASA: Yes Other anticoagulants/antiplatelets: no    Past Medical History  Diagnosis Date  . Unspecified essential hypertension   . Other and unspecified hyperlipidemia   . PVD (peripheral vascular disease) ([place])   . Dyspnea   . GERD (gastroesophageal reflux disease)   . Arthritis   . Diabetes mellitus     Type 2  . Fibromyalgia   . TIA (transient ischemic attack)   . Cataracts, bilateral   . Varicose veins   . Impaired hearing   . History of shingles   . CAD (coronary artery disease)   . Hemorrhoids   . Diverticulitis   . DDD (degenerative disc disease)   . Macular degeneration   . Carotid artery occlusion     Social History Social  History  Substance Use Topics  . Smoking status: Former Smoker    Types: Cigarettes    Quit date: 03/11/1985  . Smokeless tobacco: Never Used  . Alcohol Use: No    Family History Family History  Problem Relation Age of Onset  . Coronary artery disease Other   . Stroke Mother   . Hypertension Mother   . Stroke Father   . Hypertension Father   . Diabetes Father   . Coronary artery disease Father   . Diabetes Sister   . Hypertension Sister   . Lung cancer Brother   . Sinusitis Brother   . Healthy Brother   . Diabetes Son   . Hypertension Son     Surgical History Past Surgical History  Procedure Laterality Date  . Breast biopsy    . Kidney stone surgery  1988  . Aspiration of a cyst in the right breast    . Appendectomy    . Cardiac catheterization    . Spine surgery  1997    C5-c6 fusion  . Knee arthroscopy  2009  . Joint replacement  2011    Right total knee replacement  . Total abdominal hysterectomy  1973  . Carotid endarterectomy  12-03-06    left CEA  . Angioplasty      left leg 2003  . Esophagogastroduodenoscopy N/A 10/01/2013    Procedure: ESOPHAGOGASTRODUODENOSCOPY (EGD);  Surgeon:  Rogene Houston, MD;  [facility];  Service: Endoscopy;  Laterality: N/A;  120  . Balloon dilation N/A 10/01/2013    Procedure: BALLOON DILATION;  Surgeon: Rogene Houston, MD;  [facility];  Service: Endoscopy;  Laterality: N/A;  Venia Minks dilation N/A 10/01/2013    Procedure: Venia Minks DILATION;  Surgeon: Rogene Houston, MD;  [facility];  Service: Endoscopy;  Laterality: N/A;  . Savory dilation N/A 10/01/2013    Procedure: SAVORY DILATION;  Surgeon: Rogene Houston, MD;  [facility];  Service: Endoscopy;  Laterality: N/A;  . Cataract extraction w/phaco Right 10/14/2013    Procedure: CATARACT EXTRACTION PHACO AND INTRAOCULAR LENS PLACEMENT (IOC);  Surgeon: Tonny Branch, MD;  Location: [facility];  Service: Ophthalmology;  Laterality: Right;   CDE 7.20  . Cataract extraction w/phaco Left 11/04/2013    Procedure: CATARACT EXTRACTION PHACO AND INTRAOCULAR LENS PLACEMENT (IOC);  Surgeon: Tonny Branch, MD;  Location: [facility];  Service: Ophthalmology;  Laterality: Left;  CDE 8.57  . Colonoscopy N/A 09/19/2014    Procedure: COLONOSCOPY;  Surgeon: Rogene Houston, MD;  [facility];  Service: Endoscopy;  Laterality: N/A;  7:30-8:30  . Esophagogastroduodenoscopy N/A 10/24/2014    Procedure: ESOPHAGOGASTRODUODENOSCOPY (EGD);  Surgeon: Rogene Houston, MD;  [facility];  Service: Endoscopy;  Laterality: N/A;  730  . Givens capsule study N/A 10/24/2014    Procedure: GIVENS CAPSULE STUDY;  Surgeon: Rogene Houston, MD;  [facility];  Service: Endoscopy;  Laterality: N/A;    Allergies  Allergen Reactions  . Amoxicillin-Pot Clavulanate     REACTION: hives  . Amoxicillin-Pot Clavulanate Hives  . Ciprofloxacin Nausea Only    Dizziness  . Erythromycin     REACTION: Upset stomach  . Motrin [Ibuprofen]     REACTION: nausea  . Naproxen     REACTION: severe nausea  . Prednisone     REACTION: itchy rash  . Sulfamethoxazole-Trimethoprim     REACTION: Upset stomach    Current Outpatient Prescriptions  Medication Sig Dispense Refill  . Alum Hydroxide-Mag Trisilicate (GAVISCON) [id number] MG CHEW Chew 2-3 tablets by mouth.    Marland Kitchen aspirin (ECOTRIN LOW STRENGTH) 81 MG EC tablet Take 81 mg by mouth daily. Swallow whole.    . cloNIDine (CATAPRES) 0.1 MG tablet Take 0.1 mg by mouth 3 (three) times daily.    Marland Kitchen esomeprazole (NEXIUM) 40 MG capsule Take 40 mg by mouth daily before breakfast.    . Glucosamine-Chondroitin (OSTEO BI-FLEX REGULAR STRENGTH PO) Take 1 tablet by mouth daily.      . hydrALAZINE (APRESOLINE) 25 MG tablet Take 25 mg by mouth daily.    Marland Kitchen levothyroxine (SYNTHROID, LEVOTHROID) 50 MCG tablet Take 50 mcg by mouth daily before breakfast.    . losartan (COZAAR) 100 MG tablet Take 100 mg by mouth daily.      .  metFORMIN (GLUCOPHAGE) 500 MG tablet Take 500 mg by mouth 2 (two) times daily with a meal.    . methylcellulose (CITRUCEL) oral powder Take by mouth. 1 TBSP Twice Weekly     . Multiple Vitamins-Minerals (PRESERVISION AREDS 2 PO) Take 2 tablets by mouth daily.    Marland Kitchen NIFEdipine (PROCARDIA-XL/ADALAT-CC/NIFEDICAL-XL) 30 MG 24 hr tablet Take 30 mg by mouth daily.    . Omega-3 Fatty Acids (FISH OIL) 1000 MG CAPS Take 1,100 mg by mouth daily.     Marland Kitchen OVER THE COUNTER MEDICATION Rainbow light for women's  one a day- Patient taked 1 by mouth daily    . oxyCODONE-acetaminophen (PERCOCET/ROXICET) 5-325 MG per tablet Take 0.5 tablets by mouth every 4 (four) hours as needed for moderate pain or severe pain.     . simvastatin (ZOCOR) 20 MG tablet Take 10 mg by mouth at bedtime.     . torsemide (DEMADEX) 20 MG tablet Take 10-20 mg by mouth daily. 20 mg in the morning and 10 mg at lunch.    . traZODone (DESYREL) 50 MG tablet Take 100 mg by mouth at bedtime.     . vitamin B-12 (CYANOCOBALAMIN) 1000 MCG tablet Take 1,000 mcg by mouth daily.    Marland Kitchen co-enzyme Q-10 30 MG capsule Take 100 mg by mouth daily. Reported on 03/28/2015    . FLUZONE HIGH-DOSE 0.5 ML SUSY Reported on 03/28/2015  0   No current facility-administered medications for this visit.    Review of Systems : See HPI for pertinent positives and negatives.  Physical Examination  Filed Vitals:   03/28/15 1131 03/28/15 1134  BP: 126/65 129/63  Pulse: 63   Height: 5\' 3"  (1.6 m)   Weight: 208 lb 1.6 oz (94.394 kg)   SpO2: 98%    Body mass index is 36.87 kg/(m^2).  General: Obese female in NAD  Gait: Normal  HENT: WNL  Eyes: Pupils equal  Pulmonary: normal non-labored breathing , without rales, rhonchi, wheezing  Cardiac: RRR, + Murmur  Abdomen: soft, NT, no palpable masses  Skin: no rashes, ulcers; no Gangrene , no cellulitis; no open wounds;  Vascular Exam/Pulses:  VASCULAR EXAM  Carotid Bruits  Left  Right    transmitted cardiac murmur   Positive   VASCULAR EXAM:  Extremities without ischemic changes, 1+ bilateral pitting pretibial edema  without Gangrene; without open wounds.  LE Pulses  LEFT  RIGHT   FEMORAL  2+ palpable  2+ palpable   POPLITEAL  not palpable  not palpable   POSTERIOR TIBIAL  not palpable  not palpable   DORSALIS PEDIS  ANTERIOR TIBIAL  not palpable  not palpable   Musculoskeletal: no muscle wasting or atrophy; trace bilateral LE pretiibial edema  Neurologic: A&O X 3; Appropriate Affect ;  SENSATION: normal;  MOTOR FUNCTION: 4/5 Symmetric, CN 2-12 intact  Speech is fluent/normal   Non-Invasive Vascular Imaging CAROTID DUPLEX 03/28/2015   Right ICA: <40% stenosis. Left ICA: Widely patent CEA site with no restenosis. No significant change compared to prior exam.  ABI (Date: 03/28/2015)  R: 0.53 (0.53, 03/24/14), DP: monophasic, PT: monophasic, TBI: 0.26  L: 0.82 (0.75), DP: monophasic, PT: triphasic, TBI: 0.49    Assessment: Debra Hopkins is a 75 y.o. female who is status post left CEA in September 2008, left SFA PTA with stent 2003. She had a TIA in 1995, none since then. Cramping in left calf after walking about 1/2 block, no change in claudication symptoms in the last few years; denies non-healing wounds, denies rest pain, reports mild right calf claudication is minor. Pt admits to not walking as much due to claudication and painful arthritis in her feet at times., lack of padding in the soles of her feet.  Today's carotid duplex suggests minimal left ICA stenosis no restenosis of the left CEA site. ABI's are stable in the right with moderate decrease in arterial perfusion, and slightly improved in the left with mild decrease in arterial perfusion.    Her DM is in good control, she stopped smoking in the 1990's, she takes a  daily ASA and a statin, she is obese. She rarely drinks ETOH.  Plan:  Daily seated leg exercises as discussed and demonstrated.   Follow-up in 1 year with Carotid  Duplex scan and ABI's.    I discussed in depth with the patient the nature of atherosclerosis, and emphasized the importance of maximal medical management including strict control of blood pressure, blood glucose, and lipid levels, obtaining regular exercise, and continued cessation of smoking.  The patient is aware that without maximal medical management the underlying atherosclerotic disease process will progress, limiting the benefit of any interventions. The patient was given information about stroke prevention and what symptoms should prompt the patient to seek immediate medical care. Thank you for allowing [place] to participate in this patient's care.  Clemon Chambers, RN, MSN, FNP-C [facility] Office: [phone]  Clinic Physician: Early  03/28/2015 11:46 AM

## 2015-03-28 NOTE — Patient Instructions (Signed)
Stroke Prevention Some medical conditions and behaviors are associated with an increased chance of having a stroke. You may prevent a stroke by making healthy choices and managing medical conditions. HOW CAN I REDUCE MY RISK OF HAVING A STROKE?   Stay physically active. Get at least 30 minutes of activity on most or all days.  Do not smoke. It may also be helpful to avoid exposure to secondhand smoke.  Limit alcohol use. Moderate alcohol use is considered to be:  No more than 2 drinks per day for men.  No more than 1 drink per day for nonpregnant women.  Eat healthy foods. This involves:  Eating 5 or more servings of fruits and vegetables a day.  Making dietary changes that address high blood pressure (hypertension), high cholesterol, diabetes, or obesity.  Manage your cholesterol levels.  Making food choices that are high in fiber and low in saturated fat, trans fat, and cholesterol may control cholesterol levels.  Take any prescribed medicines to control cholesterol as directed by your health care provider.  Manage your diabetes.  Controlling your carbohydrate and sugar intake is recommended to manage diabetes.  Take any prescribed medicines to control diabetes as directed by your health care provider.  Control your hypertension.  Making food choices that are low in salt (sodium), saturated fat, trans fat, and cholesterol is recommended to manage hypertension.  Ask your health care provider if you need treatment to lower your blood pressure. Take any prescribed medicines to control hypertension as directed by your health care provider.  If you are 18-39 years of age, have your blood pressure checked every 3-5 years. If you are 40 years of age or older, have your blood pressure checked every year.  Maintain a healthy weight.  Reducing calorie intake and making food choices that are low in sodium, saturated fat, trans fat, and cholesterol are recommended to manage  weight.  Stop drug abuse.  Avoid taking birth control pills.  Talk to your health care provider about the risks of taking birth control pills if you are over 35 years old, smoke, get migraines, or have ever had a blood clot.  Get evaluated for sleep disorders (sleep apnea).  Talk to your health care provider about getting a sleep evaluation if you snore a lot or have excessive sleepiness.  Take medicines only as directed by your health care provider.  For some people, aspirin or blood thinners (anticoagulants) are helpful in reducing the risk of forming abnormal blood clots that can lead to stroke. If you have the irregular heart rhythm of atrial fibrillation, you should be on a blood thinner unless there is a good reason you cannot take them.  Understand all your medicine instructions.  Make sure that other conditions (such as anemia or atherosclerosis) are addressed. SEEK IMMEDIATE MEDICAL CARE IF:   You have sudden weakness or numbness of the face, arm, or leg, especially on one side of the body.  Your face or eyelid droops to one side.  You have sudden confusion.  You have trouble speaking (aphasia) or understanding.  You have sudden trouble seeing in one or both eyes.  You have sudden trouble walking.  You have dizziness.  You have a loss of balance or coordination.  You have a sudden, severe headache with no known cause.  You have new chest pain or an irregular heartbeat. Any of these symptoms may represent a serious problem that is an emergency. Do not wait to see if the symptoms will   go away. Get medical help at once. Call your local emergency services (911 in [place] Do not drive yourself to the hospital.   This information is not intended to replace advice given to you by your health care provider. Make sure you discuss any questions you have with your health care provider.   Document Released: 04/04/2004 Document Revised: 03/18/2014 Document Reviewed:  08/28/2012 Elsevier Interactive Patient Education 2016 [employer]    Peripheral Vascular Disease Peripheral vascular disease (PVD) is a disease of the blood vessels that are not part of your heart and brain. A simple term for PVD is poor circulation. In most cases, PVD narrows the blood vessels that carry blood from your heart to the rest of your body. This can result in a decreased supply of blood to your arms, legs, and internal organs, like your stomach or kidneys. However, it most often affects a person's lower legs and feet. There are two types of PVD.  Organic PVD. This is the more common type. It is caused by damage to the structure of blood vessels.  Functional PVD. This is caused by conditions that make blood vessels contract and tighten (spasm). Without treatment, PVD tends to get worse over time. PVD can also lead to acute ischemic limb. This is when an arm or limb suddenly has trouble getting enough blood. This is a medical emergency. CAUSES Each type of PVD has many different causes. The most common cause of PVD is buildup of a fatty material (plaque) inside of your arteries (atherosclerosis). Small amounts of plaque can break off from the walls of the blood vessels and become lodged in a smaller artery. This blocks blood flow and can cause acute ischemic limb. Other common causes of PVD include:  Blood clots that form inside of blood vessels.  Injuries to blood vessels.  Diseases that cause inflammation of blood vessels or cause blood vessel spasms.  Health behaviors and health history that increase your risk of developing PVD. RISK FACTORS  You may have a greater risk of PVD if you:  Have a family history of PVD.  Have certain medical conditions, including:  High cholesterol.  Diabetes.  High blood pressure (hypertension).  Coronary heart disease.  Past problems with blood clots.  Past injury, such as burns or a broken bone. These may have damaged blood  vessels in your limbs.  Buerger disease. This is caused by inflamed blood vessels in your hands and feet.  Some forms of arthritis.  Rare birth defects that affect the arteries in your legs.  Use tobacco.  Do not get enough exercise.  Are obese.  Are age 50 or older. SIGNS AND SYMPTOMS  PVD may cause many different symptoms. Your symptoms depend on what part of your body is not getting enough blood. Some common signs and symptoms include:  Cramps in your lower legs. This may be a symptom of poor leg circulation (claudication).  Pain and weakness in your legs while you are physically active that goes away when you rest (intermittent claudication).  Leg pain when at rest.  Leg numbness, tingling, or weakness.  Coldness in a leg or foot, especially when compared with the other leg.  Skin or hair changes. These can include:  Hair loss.  Shiny skin.  Pale or bluish skin.  Thick toenails.  Inability to get or maintain an erection (erectile dysfunction). People with PVD are more prone to developing ulcers and sores on their toes, feet, or legs. These may take longer than   normal to heal. DIAGNOSIS Your health care provider may diagnose PVD from your signs and symptoms. The health care provider will also do a physical exam. You may have tests to find out what is causing your PVD and determine its severity. Tests may include:  Blood pressure recordings from your arms and legs and measurements of the strength of your pulses (pulse volume recordings).  Imaging studies using sound waves to take pictures of the blood flow through your blood vessels (Doppler ultrasound).  Injecting a dye into your blood vessels before having imaging studies using:  X-rays (angiogram or arteriogram).  Computer-generated X-rays (CT angiogram).  A powerful electromagnetic field and a computer (magnetic resonance angiogram or MRA). TREATMENT Treatment for PVD depends on the cause of your condition  and the severity of your symptoms. It also depends on your age. Underlying causes need to be treated and controlled. These include long-lasting (chronic) conditions, such as diabetes, high cholesterol, and high blood pressure. You may need to first try making lifestyle changes and taking medicines. Surgery may be needed if these do not work. Lifestyle changes may include:  Quitting smoking.  Exercising regularly.  Following a low-fat, low-cholesterol diet. Medicines may include:  Blood thinners to prevent blood clots.  Medicines to improve blood flow.  Medicines to improve your blood cholesterol levels. Surgical procedures may include:  A procedure that uses an inflated balloon to open a blocked artery and improve blood flow (angioplasty).  A procedure to put in a tube (stent) to keep a blocked artery open (stent implant).  Surgery to reroute blood flow around a blocked artery (peripheral bypass surgery).  Surgery to remove dead tissue from an infected wound on the affected limb.  Amputation. This is surgical removal of the affected limb. This may be necessary in cases of acute ischemic limb that are not improved through medical or surgical treatments. HOME CARE INSTRUCTIONS  Take medicines only as directed by your health care provider.  Do not use any tobacco products, including cigarettes, chewing tobacco, or electronic cigarettes. If you need help quitting, ask your health care provider.  Lose weight if you are overweight, and maintain a healthy weight as directed by your health care provider.  Eat a diet that is low in fat and cholesterol. If you need help, ask your health care provider.  Exercise regularly. Ask your health care provider to suggest some good activities for you.  Use compression stockings or other mechanical devices as directed by your health care provider.  Take good care of your feet.  Wear comfortable shoes that fit well.  Check your feet often for  any cuts or sores. SEEK MEDICAL CARE IF:  You have cramps in your legs while walking.  You have leg pain when you are at rest.  You have coldness in a leg or foot.  Your skin changes.  You have erectile dysfunction.  You have cuts or sores on your feet that are not healing. SEEK IMMEDIATE MEDICAL CARE IF:  Your arm or leg turns cold and blue.  Your arms or legs become red, warm, swollen, painful, or numb.  You have chest pain or trouble breathing.  You suddenly have weakness in your face, arm, or leg.  You become very confused or lose the ability to speak.  You suddenly have a very bad headache or lose your vision.   This information is not intended to replace advice given to you by your health care provider. Make sure you discuss any questions   you have with your health care provider.   Document Released: 04/04/2004 Document Revised: 03/18/2014 Document Reviewed: 08/05/2013 Elsevier Interactive Patient Education 2016 [employer] Inc.  

## 2015-03-29 NOTE — Addendum Note (Signed)
Addended by: Dorthula Rue L on: 03/29/2015 04:59 PM   Modules accepted: Orders

## 2015-03-30 ENCOUNTER — Telehealth (INDEPENDENT_AMBULATORY_CARE_PROVIDER_SITE_OTHER): Payer: Self-pay | Admitting: Internal Medicine

## 2015-03-30 LAB — CBC
HEMATOCRIT: 27.7 % — AB (ref 36.0–46.0)
HEMOGLOBIN: 9.2 g/dL — AB (ref 12.0–15.0)
MCH: 30.6 pg (ref 26.0–34.0)
MCHC: 33.2 g/dL (ref 30.0–36.0)
MCV: 92 fL (ref 78.0–100.0)
MPV: 9.7 fL (ref 8.6–12.4)
Platelets: 470 10*3/uL — ABNORMAL HIGH (ref 150–400)
RBC: 3.01 MIL/uL — ABNORMAL LOW (ref 3.87–5.11)
RDW: 16.6 % — AB (ref 11.5–15.5)
WBC: 6.2 10*3/uL (ref 4.0–10.5)

## 2015-03-30 NOTE — Telephone Encounter (Signed)
Debra Hopkins, Needs referral to [facility] Dr. Arsenio Loader for a small bowel endoscopy

## 2015-03-31 NOTE — Telephone Encounter (Signed)
Referral and [facility], someone from there will contact patient to schedule

## 2015-04-03 ENCOUNTER — Telehealth (INDEPENDENT_AMBULATORY_CARE_PROVIDER_SITE_OTHER): Payer: Self-pay | Admitting: *Deleted

## 2015-04-03 NOTE — Telephone Encounter (Signed)
Patient needs small bowel endoscopy. She already has had given capsule study. I guess we will refer her to [facility] or [facility]

## 2015-04-03 NOTE — Telephone Encounter (Signed)
Otila Kluver (triage nurse) from [facility] called, Dr Koleen Distance has reviewed Tobi Bastos records and request for small bowel endoscopy -- they want to clarify if this is what she needs or does she need double balloon endoscopy -- if DBE these go to [facility] or [facility], no longer done at [facility] -- please advise, thanks

## 2015-04-05 NOTE — Telephone Encounter (Signed)
Spoke to Debra Hopkins, advised her this test was only at [facility] now, she states that's too far for her to drive, is there anything else you can recommend -- please advise

## 2015-04-07 NOTE — Telephone Encounter (Signed)
These are not done at [facility] anymore.

## 2015-04-07 NOTE — Telephone Encounter (Signed)
Why can she not have small bowel endoscopy at [facility] by Dr. Arsenio Loader. Debra Hopkins find out

## 2015-04-18 NOTE — Telephone Encounter (Signed)
However going to [facility].

## 2015-08-04 LAB — LIPID PANEL
Cholesterol: 146 mg/dL (ref 0–200)
HDL: 37 mg/dL (ref 35–70)
LDL Cholesterol: 69 mg/dL
TRIGLYCERIDES: 199 mg/dL — AB (ref 40–160)

## 2015-08-04 LAB — BASIC METABOLIC PANEL
BUN: 33 mg/dL — AB (ref 4–21)
Creatinine: 1.4 mg/dL — AB (ref <=1.1)

## 2015-08-04 LAB — HEMOGLOBIN A1C: HEMOGLOBIN A1C: 8.4

## 2015-08-25 ENCOUNTER — Other Ambulatory Visit: Payer: Self-pay | Admitting: Nurse Practitioner

## 2015-08-25 DIAGNOSIS — R921 Mammographic calcification found on diagnostic imaging of breast: Secondary | ICD-10-CM

## 2015-08-31 ENCOUNTER — Ambulatory Visit
Admission: RE | Admit: 2015-08-31 | Discharge: 2015-08-31 | Disposition: A | Discharge status: Home / Self Care | Payer: Medicare Other | Source: Ambulatory Visit | Attending: Nurse Practitioner | Admitting: Nurse Practitioner

## 2015-08-31 ENCOUNTER — Other Ambulatory Visit: Payer: Self-pay | Admitting: Nurse Practitioner

## 2015-08-31 DIAGNOSIS — R921 Mammographic calcification found on diagnostic imaging of breast: Secondary | ICD-10-CM

## 2015-09-07 ENCOUNTER — Ambulatory Visit: Payer: Self-pay | Admitting: "Endocrinology

## 2015-10-11 ENCOUNTER — Ambulatory Visit: Payer: Self-pay | Admitting: "Endocrinology

## 2015-10-16 ENCOUNTER — Ambulatory Visit (INDEPENDENT_AMBULATORY_CARE_PROVIDER_SITE_OTHER): Payer: Medicare Other | Admitting: "Endocrinology

## 2015-10-16 VITALS — BP 147/70 | HR 76 | Ht 63.0 in | Wt 206.0 lb

## 2015-10-16 DIAGNOSIS — E785 Hyperlipidemia, unspecified: Secondary | ICD-10-CM

## 2015-10-16 DIAGNOSIS — E1122 Type 2 diabetes mellitus with diabetic chronic kidney disease: Secondary | ICD-10-CM

## 2015-10-16 DIAGNOSIS — I10 Essential (primary) hypertension: Secondary | ICD-10-CM | POA: Diagnosis not present

## 2015-10-16 DIAGNOSIS — E669 Obesity, unspecified: Secondary | ICD-10-CM

## 2015-10-16 DIAGNOSIS — N183 Chronic kidney disease, stage 3 unspecified: Secondary | ICD-10-CM | POA: Insufficient documentation

## 2015-10-16 NOTE — Progress Notes (Signed)
Subjective:    Patient ID: Debra Hopkins, female    DOB: 02-May-1940. Patient is being seen in consultation for management of diabetes requested by  [facility], MD  Past Medical History:  Diagnosis Date  . Arthritis   . CAD (coronary artery disease)   . Carotid artery occlusion   . Cataracts, bilateral   . DDD (degenerative disc disease)   . Diabetes mellitus    Type 2  . Diverticulitis   . Dyspnea   . Fibromyalgia   . GERD (gastroesophageal reflux disease)   . Hemorrhoids   . History of shingles   . Impaired hearing   . Macular degeneration   . Other and unspecified hyperlipidemia   . PVD (peripheral vascular disease) ([place])   . TIA (transient ischemic attack)   . Unspecified essential hypertension   . Varicose veins    Past Surgical History:  Procedure Laterality Date  . ANGIOPLASTY     left leg 2003  . APPENDECTOMY    . aspiration of a cyst in the right breast    . BALLOON DILATION N/A 10/01/2013   Procedure: BALLOON DILATION;  Surgeon: Rogene Houston, MD;  [facility];  Service: Endoscopy;  Laterality: N/A;  . BREAST BIOPSY    . CARDIAC CATHETERIZATION    . CAROTID ENDARTERECTOMY  12-03-06   left CEA  . CATARACT EXTRACTION W/PHACO Right 10/14/2013   Procedure: CATARACT EXTRACTION PHACO AND INTRAOCULAR LENS PLACEMENT (IOC);  Surgeon: Tonny Branch, MD;  Location: [facility];  Service: Ophthalmology;  Laterality: Right;  CDE 7.20  . CATARACT EXTRACTION W/PHACO Left 11/04/2013   Procedure: CATARACT EXTRACTION PHACO AND INTRAOCULAR LENS PLACEMENT (IOC);  Surgeon: Tonny Branch, MD;  Location: [facility];  Service: Ophthalmology;  Laterality: Left;  CDE 8.57  . COLONOSCOPY N/A 09/19/2014   Procedure: COLONOSCOPY;  Surgeon: Rogene Houston, MD;  [facility];  Service: Endoscopy;  Laterality: N/A;  7:30-8:30  . ESOPHAGOGASTRODUODENOSCOPY N/A 10/01/2013   Procedure: ESOPHAGOGASTRODUODENOSCOPY (EGD);  Surgeon: Rogene Houston, MD;  [facility];  Service:  Endoscopy;  Laterality: N/A;  120  . ESOPHAGOGASTRODUODENOSCOPY N/A 10/24/2014   Procedure: ESOPHAGOGASTRODUODENOSCOPY (EGD);  Surgeon: Rogene Houston, MD;  [facility];  Service: Endoscopy;  Laterality: N/A;  730  . GIVENS CAPSULE STUDY N/A 10/24/2014   Procedure: GIVENS CAPSULE STUDY;  Surgeon: Rogene Houston, MD;  [facility];  Service: Endoscopy;  Laterality: N/A;  . JOINT REPLACEMENT  2011   Right total knee replacement  . [place]  . KNEE ARTHROSCOPY  2009  . MALONEY DILATION N/A 10/01/2013   Procedure: Venia Minks DILATION;  Surgeon: Rogene Houston, MD;  [facility];  Service: Endoscopy;  Laterality: N/A;  . SAVORY DILATION N/A 10/01/2013   Procedure: SAVORY DILATION;  Surgeon: Rogene Houston, MD;  [facility];  Service: Endoscopy;  Laterality: N/A;  . SPINE SURGERY  1997   C5-c6 fusion  . TOTAL ABDOMINAL HYSTERECTOMY  1973   Social History   Social History  . Marital status: Widowed    Spouse name: N/A  . Number of children: N/A  . Years of education: N/A   Social History Main Topics  . Smoking status: Former Smoker    Types: Cigarettes    Quit date: 03/11/1985  . Smokeless tobacco: Never Used  . Alcohol use No  . Drug use: No  . Sexual activity: Not Asked  Other Topics Concern  . None   Social History Narrative   Retired. Married.    Outpatient Encounter Prescriptions as of 10/16/2015  Medication Sig  . Al Hyd-Mg Tr-Alg Ac-Sod Bicarb (GAVISCON-2 PO) Take by mouth.  . Alum Hydroxide-Mag Trisilicate (GAVISCON) [id number] MG CHEW Chew 2-3 tablets by mouth.  Marland Kitchen aspirin (ECOTRIN LOW STRENGTH) 81 MG EC tablet Take 81 mg by mouth daily. Swallow whole.  . B Complex-Biotin-FA (SUPER B-COMPLEX PO) Take by mouth.  . Calcium Citrate (CALCITRATE PO) Take by mouth.  . cloNIDine (CATAPRES) 0.1 MG tablet Take 0.1 mg by mouth 3 (three) times daily.  . Ergocalciferol (VITAMIN D2) 2000 units TABS Take by mouth.  .  esomeprazole (NEXIUM) 40 MG capsule Take 40 mg by mouth daily before breakfast.  . hydrALAZINE (APRESOLINE) 25 MG tablet Take 25 mg by mouth daily.  Marland Kitchen levothyroxine (SYNTHROID, LEVOTHROID) 50 MCG tablet Take 50 mcg by mouth daily before breakfast.  . losartan (COZAAR) 100 MG tablet Take 100 mg by mouth daily.    . Magnesium 250 MG TABS Take by mouth.  . methylcellulose (CITRUCEL) oral powder Take by mouth. 1 TBSP Twice Weekly   . Misc Natural Products (OSTEO BI-FLEX ADV JOINT SHIELD PO) Take by mouth.  . Multiple Vitamin (MULTIVITAMIN) tablet Take 1 tablet by mouth daily.  Marland Kitchen NIFEdipine (PROCARDIA-XL/ADALAT-CC/NIFEDICAL-XL) 30 MG 24 hr tablet Take 30 mg by mouth daily.  Marland Kitchen omega-3 acid ethyl esters (LOVAZA) 1 g capsule Take by mouth daily.  Marland Kitchen oxyCODONE-acetaminophen (PERCOCET/ROXICET) 5-325 MG per tablet Take 0.5 tablets by mouth every 4 (four) hours as needed for moderate pain or severe pain.   . simvastatin (ZOCOR) 20 MG tablet Take 10 mg by mouth at bedtime.   . torsemide (DEMADEX) 20 MG tablet Take 10-20 mg by mouth daily. 20 mg in the morning and 10 mg at lunch.  . traZODone (DESYREL) 50 MG tablet Take 100 mg by mouth at bedtime.   . [DISCONTINUED] Glucosamine-Chondroitin (OSTEO BI-FLEX REGULAR STRENGTH PO) Take 1 tablet by mouth daily.    . [DISCONTINUED] metFORMIN (GLUCOPHAGE) 500 MG tablet Take 500 mg by mouth 2 (two) times daily with a meal.  . [DISCONTINUED] co-enzyme Q-10 30 MG capsule Take 100 mg by mouth daily. Reported on 03/28/2015  . [DISCONTINUED] FLUZONE HIGH-DOSE 0.5 ML SUSY Reported on 03/28/2015  . [DISCONTINUED] Multiple Vitamins-Minerals (PRESERVISION AREDS 2 PO) Take 2 tablets by mouth daily.  . [DISCONTINUED] Omega-3 Fatty Acids (FISH OIL) 1000 MG CAPS Take 1,100 mg by mouth daily.   . [DISCONTINUED] OVER THE COUNTER MEDICATION [facility] one a day- Patient taked 1 by mouth daily  . [DISCONTINUED] vitamin B-12 (CYANOCOBALAMIN) 1000 MCG tablet Take 1,000 mcg by  mouth daily.   No facility-administered encounter medications on file as of 10/16/2015.    ALLERGIES: Allergies  Allergen Reactions  . Amoxicillin-Pot Clavulanate     REACTION: hives  . Amoxicillin-Pot Clavulanate Hives  . Ciprofloxacin Nausea Only    Dizziness  . Erythromycin     REACTION: Upset stomach  . Motrin [Ibuprofen]     REACTION: nausea  . Naproxen     REACTION: severe nausea  . Prednisone     REACTION: itchy rash  . Sulfamethoxazole-Trimethoprim     REACTION: Upset stomach   VACCINATION STATUS:  There is no immunization history on file for this patient.  Diabetes  She presents for her initial diabetic visit. She has type 2 diabetes mellitus. Onset time: She was diagnosed at approximate age  of 57 years. Her disease course has been worsening. There are no hypoglycemic associated symptoms. Pertinent negatives for hypoglycemia include no confusion, headaches, pallor or seizures. Associated symptoms include fatigue, polydipsia and polyuria. Pertinent negatives for diabetes include no chest pain and no polyphagia. There are no hypoglycemic complications. Symptoms are worsening. Diabetic complications include nephropathy. Risk factors for coronary artery disease include dyslipidemia, diabetes mellitus, hypertension, obesity, sedentary lifestyle and tobacco exposure (She smoked for 28 years before she quit.). Current diabetic treatment includes oral agent (monotherapy). Her weight is increasing steadily. She is following a generally unhealthy diet. When asked about meal planning, she reported none. She has not had a previous visit with a dietitian. She rarely participates in exercise. Her home blood glucose trend is increasing steadily (She did not bring any meter nor logs to review today. She admits that she does not monitor blood glucose regularly.). An ACE inhibitor/angiotensin II receptor blocker is being taken. Eye exam is current.  Hyperlipidemia  This is a chronic problem. The  current episode started more than 1 year ago. The problem is controlled. Recent lipid tests were reviewed and are variable. Exacerbating diseases include diabetes, hypothyroidism and obesity. Pertinent negatives include no chest pain, myalgias or shortness of breath. Current antihyperlipidemic treatment includes statins. Risk factors for coronary artery disease include diabetes mellitus, dyslipidemia, hypertension, family history, obesity and a sedentary lifestyle.  Hypertension  This is a chronic problem. The current episode started more than 1 year ago. The problem is controlled. Pertinent negatives include no chest pain, headaches, palpitations or shortness of breath. Risk factors for coronary artery disease include diabetes mellitus, dyslipidemia, obesity, sedentary lifestyle and smoking/tobacco exposure. Past treatments include angiotensin blockers. Hypertensive end-organ damage includes kidney disease.    Review of Systems  Constitutional: Positive for fatigue. Negative for unexpected weight change.  HENT: Negative for trouble swallowing and voice change.   Eyes: Negative for visual disturbance.  Respiratory: Negative for cough, shortness of breath and wheezing.   Cardiovascular: Negative for chest pain, palpitations and leg swelling.  Gastrointestinal: Negative for diarrhea, nausea and vomiting.  Endocrine: Positive for polydipsia and polyuria. Negative for cold intolerance, heat intolerance and polyphagia.  Genitourinary: Positive for frequency. Negative for dysuria and flank pain.  Musculoskeletal: Negative for arthralgias and myalgias.  Skin: Negative for color change, pallor, rash and wound.  Neurological: Negative for seizures and headaches.  Psychiatric/Behavioral: Negative for confusion and suicidal ideas.    Objective:    BP (!) 147/70   Pulse 76   Ht 5\' 3"  (1.6 m)   Wt 206 lb (93.4 kg)   BMI 36.49 kg/m   Wt Readings from Last 3 Encounters:  10/16/15 206 lb (93.4 kg)   03/28/15 208 lb 1.6 oz (94.4 kg)  02/21/15 208 lb 12.8 oz (94.7 kg)    Physical Exam  Constitutional: She is oriented to person, place, and time. She appears well-developed.  HENT:  Head: Normocephalic and atraumatic.  Eyes: EOM are normal.  Neck: Normal range of motion. Neck supple. No tracheal deviation present. No thyromegaly present.  Cardiovascular: Normal rate and regular rhythm.   Pulmonary/Chest: Effort normal and breath sounds normal.  Abdominal: Soft. Bowel sounds are normal. There is no tenderness. There is no guarding.  Musculoskeletal: Normal range of motion. She exhibits no edema.  Neurological: She is alert and oriented to person, place, and time. She has normal reflexes. No cranial nerve deficit. Coordination normal.  Skin: Skin is warm and dry. No rash noted. No erythema. No  pallor.  Psychiatric: She has a normal mood and affect. Judgment normal.    CMP     Component Value Date/Time   NA 139 10/11/2013 1405   K 4.9 10/11/2013 1405   CL 99 10/11/2013 1405   CO2 28 10/11/2013 1405   GLUCOSE 114 (H) 10/11/2013 1405   BUN 32 (H) 10/11/2013 1405   CREATININE 1.12 (H) 10/11/2013 1405   CALCIUM 9.5 10/11/2013 1405   PROT 7.1 01/19/2010 1215   ALBUMIN 4.4 01/19/2010 1215   AST 20 01/19/2010 1215   ALT 18 01/19/2010 1215   ALKPHOS 56 01/19/2010 1215   BILITOT 0.5 01/19/2010 1215   GFRNONAA 47 (L) 10/11/2013 1405   GFRAA 55 (L) 10/11/2013 1405   Recent Results (from the past 2160 hour(s))  Basic metabolic panel     Status: Abnormal   Collection Time: 08/04/15 12:00 AM  Result Value Ref Range   BUN 33 (A) 4 - 21 mg/dL   Creatinine 1.4 (A) 0.5 - 1.1 mg/dL  Lipid panel     Status: Abnormal   Collection Time: 08/04/15 12:00 AM  Result Value Ref Range   Triglycerides 199 (A) 40 - 160 mg/dL   Cholesterol 146 0 - 200 mg/dL   HDL 37 35 - 70 mg/dL   LDL Cholesterol 69 mg/dL  Hemoglobin A1c     Status: None   Collection Time: 08/04/15 12:00 AM  Result Value Ref  Range   Hemoglobin A1C 8.4       Assessment & Plan:   1. Type 2 diabetes mellitus with stage 3 chronic kidney disease, without long-term current use of insulin ([place])  - Patient has currently uncontrolled symptomatic type 2 DM since  75 years of age,  with most recent A1c of 8.4 %. Recent labs reviewed.   Her diabetes is complicated by chronic kidney disease stage 3 and patient remains at a high risk for more acute and chronic complications of diabetes which include CAD, CVA, CKD, retinopathy, and neuropathy. These are all discussed in detail with the patient.  - I have counseled the patient on diet management and weight loss, by adopting a carbohydrate restricted/protein rich diet.  - Suggestion is made for patient to avoid simple carbohydrates   from their diet including Cakes , Desserts, Ice Cream,  Soda (  diet and regular) , Sweet Tea , Candies,  Chips, Cookies, Artificial Sweeteners,   and "Sugar-free" Products . This will help patient to have stable blood glucose profile and potentially avoid unintended weight gain.  - I encouraged the patient to switch to  unprocessed or minimally processed complex starch and increased protein intake (animal or plant source), fruits, and vegetables.  - Patient is advised to stick to a routine mealtimes to eat 3 meals  a day and avoid unnecessary snacks ( to snack only to correct hypoglycemia).  - The patient will be scheduled with Jearld Fenton, RDN, CDE for individualized DM education.  - I have approached patient with the following individualized plan to manage diabetes and patient agrees:   - I  will proceed to initiate strict monitoring of glucose  AC and HS.  - She will return in 4 days with her meter and logs to discuss plan for long-term diabetes management. -Based on her duration of diabetes and degree of control, she may require a brief period of basal insulin treatment.  -Patient is encouraged to call clinic for blood glucose levels  less than 70 or above 300 mg /dl.  -  I will discontinue metformin, due to CKD. - She is not a candidate for SGLT2 I, . - She will be  be considered for incretin therapy as appropriate next visit. - Patient specific target  A1c;  LDL, HDL, Triglycerides, and  Waist Circumference were discussed in detail.  2) BP/HTN: controlled. Continue current medications including ACEI/ARB. 3) Lipids/HPL: controlled, continue statins. 4)  Weight/Diet: CDE Consult will be initiated , exercise, and detailed carbohydrates information provided.  5) Chronic Care/Health Maintenance:  -Patient is on ACEI/ARB and Statin medications and encouraged to continue to follow up with Ophthalmology, Podiatrist at least yearly or according to recommendations, and advised to   stay away from smoking. I have recommended yearly flu vaccine and pneumonia vaccination at least every 5 years; moderate intensity exercise for up to 150 minutes weekly; and  sleep for at least 7 hours a day.  - 60 minutes of time was spent on the care of this patient , 50% of which was applied for counseling on diabetes complications and their preventions.  - Patient to bring meter and  blood glucose logs during their next visit.   - I advised patient to maintain close follow up with [facility], MD for primary care needs.  Follow up plan: - Return in about 4 days (around 10/20/2015) for follow up with meter and logs- no labs.  Glade Lloyd, MD Phone: [phone]  Fax: [phone]   10/16/2015, 3:33 PM

## 2015-10-16 NOTE — Patient Instructions (Signed)

## 2015-10-20 ENCOUNTER — Encounter: Payer: Self-pay | Admitting: "Endocrinology

## 2015-10-20 ENCOUNTER — Ambulatory Visit (INDEPENDENT_AMBULATORY_CARE_PROVIDER_SITE_OTHER): Payer: Medicare Other | Admitting: "Endocrinology

## 2015-10-20 VITALS — BP 141/63 | HR 76 | Ht 63.0 in | Wt 201.0 lb

## 2015-10-20 DIAGNOSIS — N183 Chronic kidney disease, stage 3 unspecified: Secondary | ICD-10-CM

## 2015-10-20 DIAGNOSIS — E785 Hyperlipidemia, unspecified: Secondary | ICD-10-CM

## 2015-10-20 DIAGNOSIS — I10 Essential (primary) hypertension: Secondary | ICD-10-CM | POA: Diagnosis not present

## 2015-10-20 DIAGNOSIS — E1122 Type 2 diabetes mellitus with diabetic chronic kidney disease: Secondary | ICD-10-CM | POA: Diagnosis not present

## 2015-10-20 DIAGNOSIS — E038 Other specified hypothyroidism: Secondary | ICD-10-CM | POA: Diagnosis not present

## 2015-10-20 MED ORDER — INSULIN PEN NEEDLE 31G X 8 MM MISC: 1.0000 | 3 refills | Status: DC

## 2015-10-20 MED ORDER — INSULIN GLARGINE 100 UNIT/ML SOLOSTAR PEN: 20.0000 [IU] | PEN_INJECTOR | Freq: Every day | SUBCUTANEOUS | 2 refills | Status: DC

## 2015-10-20 NOTE — Progress Notes (Signed)
Subjective:    Patient ID: Debra Hopkins, female    DOB: 11/09/1940. Patient is being seen in f/u for management of diabetes requested by  [facility], [place]  Past Medical History:  Diagnosis Date  . Arthritis   . CAD (coronary artery disease)   . Carotid artery occlusion   . Cataracts, bilateral   . DDD (degenerative disc disease)   . Diabetes mellitus    Type 2  . Diverticulitis   . Dyspnea   . Fibromyalgia   . GERD (gastroesophageal reflux disease)   . Hemorrhoids   . History of shingles   . Impaired hearing   . Macular degeneration   . Other and unspecified hyperlipidemia   . PVD (peripheral vascular disease) ([place])   . TIA (transient ischemic attack)   . Unspecified essential hypertension   . Varicose veins    Past Surgical History:  Procedure Laterality Date  . ANGIOPLASTY     left leg 2003  . APPENDECTOMY    . aspiration of a cyst in the right breast    . BALLOON DILATION N/A 10/01/2013   Procedure: BALLOON DILATION;  Surgeon: Rogene Houston, MD;  [facility]: [facility];  Service: Endoscopy;  Laterality: N/A;  . BREAST BIOPSY    . CARDIAC CATHETERIZATION    . CAROTID ENDARTERECTOMY  12-03-06   left CEA  . CATARACT EXTRACTION W/PHACO Right 10/14/2013   Procedure: CATARACT EXTRACTION PHACO AND INTRAOCULAR LENS PLACEMENT (IOC);  Surgeon: Tonny Branch, MD;  Location: [facility];  Service: Ophthalmology;  Laterality: Right;  CDE 7.20  . CATARACT EXTRACTION W/PHACO Left 11/04/2013   Procedure: CATARACT EXTRACTION PHACO AND INTRAOCULAR LENS PLACEMENT (IOC);  Surgeon: Tonny Branch, MD;  Location: [facility];  Service: Ophthalmology;  Laterality: Left;  CDE 8.57  . COLONOSCOPY N/A 09/19/2014   Procedure: COLONOSCOPY;  Surgeon: Rogene Houston, MD;  [facility];  Service: Endoscopy;  Laterality: N/A;  7:30-8:30  . ESOPHAGOGASTRODUODENOSCOPY N/A 10/01/2013   Procedure: ESOPHAGOGASTRODUODENOSCOPY (EGD);  Surgeon: Rogene Houston, MD;  [facility];  Service: Endoscopy;   Laterality: N/A;  120  . ESOPHAGOGASTRODUODENOSCOPY N/A 10/24/2014   Procedure: ESOPHAGOGASTRODUODENOSCOPY (EGD);  Surgeon: Rogene Houston, MD;  [facility];  Service: Endoscopy;  Laterality: N/A;  730  . GIVENS CAPSULE STUDY N/A 10/24/2014   Procedure: GIVENS CAPSULE STUDY;  Surgeon: Rogene Houston, MD;  [facility];  Service: Endoscopy;  Laterality: N/A;  . JOINT REPLACEMENT  2011   Right total knee replacement  . Swartz  . KNEE ARTHROSCOPY  2009  . MALONEY DILATION N/A 10/01/2013   Procedure: Venia Minks DILATION;  Surgeon: Rogene Houston, MD;  [facility];  Service: Endoscopy;  Laterality: N/A;  . SAVORY DILATION N/A 10/01/2013   Procedure: SAVORY DILATION;  Surgeon: Rogene Houston, MD;  [facility];  Service: Endoscopy;  Laterality: N/A;  . SPINE SURGERY  1997   C5-c6 fusion  . TOTAL ABDOMINAL HYSTERECTOMY  1973   Social History   Social History  . Marital status: Widowed    Spouse name: N/A  . Number of children: N/A  . Years of education: N/A   Social History Main Topics  . Smoking status: Former Smoker    Types: Cigarettes    Quit date: 03/11/1985  . Smokeless tobacco: Never Used  . Alcohol use No  . Drug use: No  . Sexual activity: Not on file   Other  Topics Concern  . Not on file   Social History Narrative   Retired. Married.    Outpatient Encounter Prescriptions as of 10/20/2015  Medication Sig  . Alum Hydroxide-Mag Trisilicate (GAVISCON) [id number] MG CHEW Chew 2-3 tablets by mouth.  Marland Kitchen aspirin (ECOTRIN LOW STRENGTH) 81 MG EC tablet Take 81 mg by mouth daily. Swallow whole.  . B Complex-Biotin-FA (SUPER B-COMPLEX PO) Take by mouth.  . Calcium Citrate (CALCITRATE PO) Take by mouth.  . cloNIDine (CATAPRES) 0.1 MG tablet Take 0.1 mg by mouth 3 (three) times daily.  . Ergocalciferol (VITAMIN D2) 2000 units TABS Take by mouth.  . esomeprazole (NEXIUM) 40 MG capsule Take 40 mg by mouth daily before  breakfast.  . hydrALAZINE (APRESOLINE) 25 MG tablet Take 25 mg by mouth daily.  . Insulin Glargine (LANTUS SOLOSTAR) 100 UNIT/ML Solostar Pen Inject 20 Units into the skin daily at 10 pm.  . Insulin Pen Needle (B-D ULTRAFINE III SHORT PEN) 31G X 8 MM MISC 1 each by Does not apply route as directed.  Marland Kitchen levothyroxine (SYNTHROID, LEVOTHROID) 50 MCG tablet Take 50 mcg by mouth daily before breakfast.  . losartan (COZAAR) 100 MG tablet Take 100 mg by mouth daily.    . Magnesium 250 MG TABS Take by mouth.  . methylcellulose (CITRUCEL) oral powder Take by mouth. 1 TBSP Twice Weekly   . Misc Natural Products (OSTEO BI-FLEX ADV JOINT SHIELD PO) Take by mouth.  . Multiple Vitamin (MULTIVITAMIN) tablet Take 1 tablet by mouth daily.  Marland Kitchen NIFEdipine (PROCARDIA-XL/ADALAT-CC/NIFEDICAL-XL) 30 MG 24 hr tablet Take 30 mg by mouth daily.  Marland Kitchen omega-3 acid ethyl esters (LOVAZA) 1 g capsule Take by mouth daily.  Marland Kitchen oxyCODONE-acetaminophen (PERCOCET/ROXICET) 5-325 MG per tablet Take 0.5 tablets by mouth every 4 (four) hours as needed for moderate pain or severe pain.   . simvastatin (ZOCOR) 20 MG tablet Take 10 mg by mouth at bedtime.   . torsemide (DEMADEX) 20 MG tablet Take 10-20 mg by mouth daily. 20 mg in the morning and 10 mg at lunch.  . traZODone (DESYREL) 50 MG tablet Take 100 mg by mouth at bedtime.   . [DISCONTINUED] Al Hyd-Mg Tr-Alg Ac-Sod Bicarb (GAVISCON-2 PO) Take by mouth.   No facility-administered encounter medications on file as of 10/20/2015.    ALLERGIES: Allergies  Allergen Reactions  . Amoxicillin-Pot Clavulanate     REACTION: hives  . Amoxicillin-Pot Clavulanate Hives  . Ciprofloxacin Nausea Only    Dizziness  . Erythromycin     REACTION: Upset stomach  . Motrin [Ibuprofen]     REACTION: nausea  . Naproxen     REACTION: severe nausea  . Prednisone     REACTION: itchy rash  . Sulfamethoxazole-Trimethoprim     REACTION: Upset stomach   VACCINATION STATUS:  There is no immunization  history on file for this patient.  Diabetes  She presents for her follow-up diabetic visit. She has type 2 diabetes mellitus. Onset time: She was diagnosed at approximate age of 63 years. Her disease course has been worsening. There are no hypoglycemic associated symptoms. Pertinent negatives for hypoglycemia include no confusion, headaches, pallor or seizures. Associated symptoms include fatigue, polydipsia and polyuria. Pertinent negatives for diabetes include no chest pain and no polyphagia. There are no hypoglycemic complications. Symptoms are worsening. Diabetic complications include nephropathy. Risk factors for coronary artery disease include dyslipidemia, diabetes mellitus, hypertension, obesity, sedentary lifestyle and tobacco exposure (She smoked for 28 years before she quit.). Current diabetic treatment includes oral agent (  monotherapy). Her weight is decreasing steadily. She is following a generally unhealthy diet. When asked about meal planning, she reported none. She has not had a previous visit with a dietitian. She rarely participates in exercise. Her home blood glucose trend is increasing steadily (She did bring her meter and  logs , showing EAG of 200+ monitoring 4 x a day .). Her breakfast blood glucose range is generally >200 mg/dl. Her lunch blood glucose range is generally >200 mg/dl. Her dinner blood glucose range is generally >200 mg/dl. Her overall blood glucose range is >200 mg/dl. An ACE inhibitor/angiotensin II receptor blocker is being taken. Eye exam is current.  Hyperlipidemia  This is a chronic problem. The current episode started more than 1 year ago. The problem is controlled. Recent lipid tests were reviewed and are variable. Exacerbating diseases include diabetes, hypothyroidism and obesity. Pertinent negatives include no chest pain, myalgias or shortness of breath. Current antihyperlipidemic treatment includes statins. Risk factors for coronary artery disease include  diabetes mellitus, dyslipidemia, hypertension, family history, obesity and a sedentary lifestyle.  Hypertension  This is a chronic problem. The current episode started more than 1 year ago. The problem is controlled. Pertinent negatives include no chest pain, headaches, palpitations or shortness of breath. Risk factors for coronary artery disease include diabetes mellitus, dyslipidemia, obesity, sedentary lifestyle and smoking/tobacco exposure. Past treatments include angiotensin blockers. Hypertensive end-organ damage includes kidney disease.    Review of Systems  Constitutional: Positive for fatigue. Negative for unexpected weight change.  HENT: Negative for trouble swallowing and voice change.   Eyes: Negative for visual disturbance.  Respiratory: Negative for cough, shortness of breath and wheezing.   Cardiovascular: Negative for chest pain, palpitations and leg swelling.  Gastrointestinal: Negative for diarrhea, nausea and vomiting.  Endocrine: Positive for polydipsia and polyuria. Negative for cold intolerance, heat intolerance and polyphagia.  Genitourinary: Positive for frequency. Negative for dysuria and flank pain.  Musculoskeletal: Negative for arthralgias and myalgias.  Skin: Negative for color change, pallor, rash and wound.  Neurological: Negative for seizures and headaches.  Psychiatric/Behavioral: Negative for confusion and suicidal ideas.    Objective:    BP (!) 141/63   Pulse 76   Ht 5\' 3"  (1.6 m)   Wt 201 lb (91.2 kg)   BMI 35.61 kg/m   Wt Readings from Last 3 Encounters:  10/20/15 201 lb (91.2 kg)  10/16/15 206 lb (93.4 kg)  03/28/15 208 lb 1.6 oz (94.4 kg)    Physical Exam  Constitutional: She is oriented to person, place, and time. She appears well-developed.  HENT:  Head: Normocephalic and atraumatic.  Eyes: EOM are normal.  Neck: Normal range of motion. Neck supple. No tracheal deviation present. No thyromegaly present.  Cardiovascular: Normal rate and  regular rhythm.   Pulmonary/Chest: Effort normal and breath sounds normal.  Abdominal: Soft. Bowel sounds are normal. There is no tenderness. There is no guarding.  Musculoskeletal: Normal range of motion. She exhibits no edema.  Neurological: She is alert and oriented to person, place, and time. She has normal reflexes. No cranial nerve deficit. Coordination normal.  Skin: Skin is warm and dry. No rash noted. No erythema. No pallor.  Psychiatric: She has a normal mood and affect. Judgment normal.    CMP     Component Value Date/Time   NA 139 10/11/2013 1405   K 4.9 10/11/2013 1405   CL 99 10/11/2013 1405   CO2 28 10/11/2013 1405   GLUCOSE 114 (H) 10/11/2013 1405  BUN 33 (A) 08/04/2015   CREATININE 1.4 (A) 08/04/2015   CREATININE 1.12 (H) 10/11/2013 1405   CALCIUM 9.5 10/11/2013 1405   PROT 7.1 01/19/2010 1215   ALBUMIN 4.4 01/19/2010 1215   AST 20 01/19/2010 1215   ALT 18 01/19/2010 1215   ALKPHOS 56 01/19/2010 1215   BILITOT 0.5 01/19/2010 1215   GFRNONAA 47 (L) 10/11/2013 1405   GFRAA 55 (L) 10/11/2013 1405   Recent Results (from the past 2160 hour(s))  Basic metabolic panel     Status: Abnormal   Collection Time: 08/04/15 12:00 AM  Result Value Ref Range   BUN 33 (A) 4 - 21 mg/dL   Creatinine 1.4 (A) 0.5 - 1.1 mg/dL  Lipid panel     Status: Abnormal   Collection Time: 08/04/15 12:00 AM  Result Value Ref Range   Triglycerides 199 (A) 40 - 160 mg/dL   Cholesterol 146 0 - 200 mg/dL   HDL 37 35 - 70 mg/dL   LDL Cholesterol 69 mg/dL  Hemoglobin A1c     Status: None   Collection Time: 08/04/15 12:00 AM  Result Value Ref Range   Hemoglobin A1C 8.4       Assessment & Plan:   1. Type 2 diabetes mellitus with stage 3 chronic kidney disease, without long-term current use of insulin ([place])  - Patient has currently uncontrolled symptomatic type 2 DM since  75 years of age,  with most recent A1c of 8.4 %. Recent labs reviewed.   Her diabetes is complicated by chronic  kidney disease stage 3 and patient remains at a high risk for more acute and chronic complications of diabetes which include CAD, CVA, CKD, retinopathy, and neuropathy. These are all discussed in detail with the patient.  - I have counseled the patient on diet management and weight loss, by adopting a carbohydrate restricted/protein rich diet.  - Suggestion is made for patient to avoid simple carbohydrates   from their diet including Cakes , Desserts, Ice Cream,  Soda (  diet and regular) , Sweet Tea , Candies,  Chips, Cookies, Artificial Sweeteners,   and "Sugar-free" Products . This will help patient to have stable blood glucose profile and potentially avoid unintended weight gain.  - I encouraged the patient to switch to  unprocessed or minimally processed complex starch and increased protein intake (animal or plant source), fruits, and vegetables.  - Patient is advised to stick to a routine mealtimes to eat 3 meals  a day and avoid unnecessary snacks ( to snack only to correct hypoglycemia).  - The patient will be scheduled with Jearld Fenton, RDN, CDE for individualized DM education.  - I have approached patient with the following individualized plan to manage diabetes and patient agrees:   -  She came with average blood glucose of 200 pounds monitoring 4 times a day.  - She will need insulin treatment given the fact that she does not have good oral option due to CK D. - She reluctantly accepts to go on insulin. -I have demonstrated insulin use in the exam room with Lantus sample. -I will initiate 20 units of Lantus daily at bedtime associated with monitoring of blood glucose twice a day before breakfast and at bedtime.  - I will discontinue metformin, due to CKD. - She is not a candidate for SGLT2 I, . - She will be  be considered for incretin therapy as appropriate next visit. - Patient specific target  A1c;  LDL, HDL, Triglycerides, and  Waist Circumference were discussed in  detail.  2) BP/HTN: controlled. Continue current medications including ACEI/ARB. 3) Lipids/HPL: controlled, continue statins. 4)  Weight/Diet: CDE Consult will be initiated , exercise, and detailed carbohydrates information provided.  5) Chronic Care/Health Maintenance:  -Patient is on ACEI/ARB and Statin medications and encouraged to continue to follow up with Ophthalmology, Podiatrist at least yearly or according to recommendations, and advised to   stay away from smoking. I have recommended yearly flu vaccine and pneumonia vaccination at least every 5 years; moderate intensity exercise for up to 150 minutes weekly; and  sleep for at least 7 hours a day.  - 30 minutes of time was spent on the care of this patient , 50% of which was applied for counseling on diabetes complications and their preventions.  - Patient to bring meter and  blood glucose logs during their next visit.   - I advised patient to maintain close follow up with [facility], MD for primary care needs.  Follow up plan: - Return in about 4 weeks (around 11/17/2015) for follow up with meter and logs- no labs.  Glade Lloyd, MD Phone: [phone]  Fax: [phone]   10/20/2015, 2:43 PM

## 2015-11-16 LAB — CMP14+EGFR
ALBUMIN: 4.4 g/dL (ref 3.5–4.8)
ALK PHOS: 63 IU/L (ref 39–117)
ALT: 19 IU/L (ref 0–32)
AST: 13 IU/L (ref 0–40)
Albumin/Globulin Ratio: 2.4 — ABNORMAL HIGH (ref 1.2–2.2)
BILIRUBIN TOTAL: 0.4 mg/dL (ref 0.0–1.2)
BUN/Creatinine Ratio: 26 (ref 12–28)
BUN: 30 mg/dL — AB (ref 8–27)
CHLORIDE: 99 mmol/L (ref 96–106)
CO2: 24 mmol/L (ref 18–29)
CREATININE: 1.15 mg/dL — AB (ref 0.57–1.00)
Calcium: 9.3 mg/dL (ref 8.7–10.3)
GFR calc Af Amer: 53 mL/min/{1.73_m2} — ABNORMAL LOW (ref >59)
GFR calc non Af Amer: 46 mL/min/{1.73_m2} — ABNORMAL LOW (ref >59)
GLUCOSE: 245 mg/dL — AB (ref 65–99)
Globulin, Total: 1.8 g/dL (ref 1.5–4.5)
Potassium: 4.3 mmol/L (ref 3.5–5.2)
Sodium: 139 mmol/L (ref 134–144)
Total Protein: 6.2 g/dL (ref 6.0–8.5)

## 2015-11-16 LAB — MICROALBUMIN / CREATININE URINE RATIO
CREATININE, UR: 50.1 mg/dL
MICROALB/CREAT RATIO: 65.1 mg/g{creat} — AB (ref 0.0–30.0)
MICROALBUM., U, RANDOM: 32.6 ug/mL

## 2015-11-16 LAB — HEMOGLOBIN A1C
ESTIMATED AVERAGE GLUCOSE: 148 mg/dL
HEMOGLOBIN A1C: 6.8 % — AB (ref 4.8–5.6)

## 2015-11-17 ENCOUNTER — Emergency Department (HOSPITAL_COMMUNITY)
Admission: EM | Admit: 2015-11-17 | Discharge: 2015-11-17 | Disposition: A | Discharge status: Home / Self Care | Payer: Medicare Other | Attending: Emergency Medicine | Admitting: Emergency Medicine

## 2015-11-17 ENCOUNTER — Encounter (HOSPITAL_COMMUNITY): Payer: Self-pay | Admitting: Emergency Medicine

## 2015-11-17 DIAGNOSIS — I10 Essential (primary) hypertension: Secondary | ICD-10-CM | POA: Diagnosis not present

## 2015-11-17 DIAGNOSIS — E039 Hypothyroidism, unspecified: Secondary | ICD-10-CM | POA: Diagnosis not present

## 2015-11-17 DIAGNOSIS — Z7982 Long term (current) use of aspirin: Secondary | ICD-10-CM | POA: Insufficient documentation

## 2015-11-17 DIAGNOSIS — Z79899 Other long term (current) drug therapy: Secondary | ICD-10-CM | POA: Insufficient documentation

## 2015-11-17 DIAGNOSIS — I2510 Atherosclerotic heart disease of native coronary artery without angina pectoris: Secondary | ICD-10-CM | POA: Insufficient documentation

## 2015-11-17 DIAGNOSIS — E119 Type 2 diabetes mellitus without complications: Secondary | ICD-10-CM | POA: Insufficient documentation

## 2015-11-17 DIAGNOSIS — Z87891 Personal history of nicotine dependence: Secondary | ICD-10-CM | POA: Insufficient documentation

## 2015-11-17 DIAGNOSIS — Z8673 Personal history of transient ischemic attack (TIA), and cerebral infarction without residual deficits: Secondary | ICD-10-CM | POA: Insufficient documentation

## 2015-11-17 DIAGNOSIS — N390 Urinary tract infection, site not specified: Secondary | ICD-10-CM | POA: Insufficient documentation

## 2015-11-17 DIAGNOSIS — Z794 Long term (current) use of insulin: Secondary | ICD-10-CM | POA: Insufficient documentation

## 2015-11-17 DIAGNOSIS — R300 Dysuria: Secondary | ICD-10-CM | POA: Diagnosis present

## 2015-11-17 LAB — CBC WITH DIFFERENTIAL/PLATELET
Basophils Absolute: 0 10*3/uL (ref 0.0–0.1)
Basophils Relative: 0 %
EOS ABS: 0.1 10*3/uL (ref 0.0–0.7)
EOS PCT: 1 %
HEMATOCRIT: 28.7 % — AB (ref 36.0–46.0)
Hemoglobin: 9.7 g/dL — ABNORMAL LOW (ref 12.0–15.0)
LYMPHS ABS: 1.6 10*3/uL (ref 0.7–4.0)
LYMPHS PCT: 28 %
MCH: 31.9 pg (ref 26.0–34.0)
MCHC: 33.8 g/dL (ref 30.0–36.0)
MCV: 94.4 fL (ref 78.0–100.0)
MONOS PCT: 10 %
Monocytes Absolute: 0.6 10*3/uL (ref 0.1–1.0)
NEUTROS PCT: 61 %
Neutro Abs: 3.4 10*3/uL (ref 1.7–7.7)
PLATELETS: 495 10*3/uL — AB (ref 150–400)
RBC: 3.04 MIL/uL — AB (ref 3.87–5.11)
RDW: 16.3 % — ABNORMAL HIGH (ref 11.5–15.5)
WBC: 5.6 10*3/uL (ref 4.0–10.5)

## 2015-11-17 LAB — BASIC METABOLIC PANEL
Anion gap: 3 — ABNORMAL LOW (ref 5–15)
BUN: 41 mg/dL — AB (ref 6–20)
CHLORIDE: 106 mmol/L (ref 101–111)
CO2: 29 mmol/L (ref 22–32)
CREATININE: 1.19 mg/dL — AB (ref 0.44–1.00)
Calcium: 9.3 mg/dL (ref 8.9–10.3)
GFR calc Af Amer: 50 mL/min — ABNORMAL LOW (ref >60)
GFR calc non Af Amer: 43 mL/min — ABNORMAL LOW (ref >60)
Glucose, Bld: 115 mg/dL — ABNORMAL HIGH (ref 65–99)
POTASSIUM: 4.4 mmol/L (ref 3.5–5.1)
Sodium: 138 mmol/L (ref 135–145)

## 2015-11-17 LAB — URINE MICROSCOPIC-ADD ON

## 2015-11-17 LAB — URINALYSIS, ROUTINE W REFLEX MICROSCOPIC
BILIRUBIN URINE: NEGATIVE
Glucose, UA: 100 mg/dL — AB
HGB URINE DIPSTICK: NEGATIVE
Ketones, ur: NEGATIVE mg/dL
Nitrite: POSITIVE — AB
PH: 5.5 (ref 5.0–8.0)
SPECIFIC GRAVITY, URINE: 1.01 (ref 1.005–1.030)

## 2015-11-17 MED ORDER — CEPHALEXIN 500 MG PO CAPS: 500.0000 mg | ORAL_CAPSULE | Freq: Four times a day (QID) | ORAL | 0 refills | Status: DC

## 2015-11-17 NOTE — Discharge Instructions (Signed)
Try to drink an extra liter or 2 of water each day.  Take Tylenol if needed for pain or fever.

## 2015-11-17 NOTE — ED Provider Notes (Signed)
[facility] DEPT Provider Note   CSN: [id number] Arrival date & time: 11/17/15  1007  By signing my name below, I, Debra Hopkins, attest that this documentation has been prepared under the direction and in the presence of Debra Bo, MD . Electronically Signed: Jeanell Hopkins, Scribe. 11/17/2015. 12:34 PM.  History   Chief Complaint Chief Complaint  Patient presents with  . Dysuria   The history is provided by the patient. No language interpreter was used.   HPI Comments: Debra Hopkins is a 75 y.o. female who presents to the Emergency Department complaining of constant moderate dysuria that started 1.5 months ago. She states she started having urinary symptoms since starting Jardiance. Associated symptoms include bladder pressure, urinary pain, and chills. Has been taking Azo the past 3 days with some relief. Denies hematuria, vomiting, chest pain, or BM changes.   Past Medical History:  Diagnosis Date  . Arthritis   . CAD (coronary artery disease)   . Carotid artery occlusion   . Cataracts, bilateral   . DDD (degenerative disc disease)   . Diabetes mellitus    Type 2  . Diverticulitis   . Dyspnea   . Fibromyalgia   . GERD (gastroesophageal reflux disease)   . Hemorrhoids   . History of shingles   . Impaired hearing   . Macular degeneration   . Other and unspecified hyperlipidemia   . PVD (peripheral vascular disease) ([place])   . TIA (transient ischemic attack)   . Unspecified essential hypertension   . Varicose veins     Patient Active Problem List   Diagnosis Date Noted  . Type 2 diabetes mellitus with stage 3 chronic kidney disease, without long-term current use of insulin ([place]) 10/16/2015  . Obesity 10/16/2015  . Anemia 02/21/2015  . GERD (gastroesophageal reflux disease) 02/08/2014  . Cough 02/08/2014  . Dysphagia, unspecified(787.20) 09/27/2013  . Fibromyalgia 09/27/2013  . Hypothyroidism 09/27/2013  . Aftercare following surgery of the circulatory system,  [place] 02/25/2012  . Occlusion and stenosis of carotid artery without mention of cerebral infarction 02/19/2011  . CAD 07/31/2009  . Hyperlipidemia 11/22/2008  . Essential hypertension, benign 11/22/2008  . PVD 11/22/2008  . DYSPNEA 11/22/2008    Past Surgical History:  Procedure Laterality Date  . ANGIOPLASTY     left leg 2003  . APPENDECTOMY    . aspiration of a cyst in the right breast    . BALLOON DILATION N/A 10/01/2013   Procedure: BALLOON DILATION;  Surgeon: Rogene Houston, MD;  Location: [facility];  Service: Endoscopy;  Laterality: N/A;  . BREAST BIOPSY    . BREAST BIOPSY Left   . CARDIAC CATHETERIZATION    . CAROTID ENDARTERECTOMY  12-03-06   left CEA  . CATARACT EXTRACTION W/PHACO Right 10/14/2013   Procedure: CATARACT EXTRACTION PHACO AND INTRAOCULAR LENS PLACEMENT (IOC);  Surgeon: Tonny Branch, MD;  Location: [facility] ORS;  Service: Ophthalmology;  Laterality: Right;  CDE 7.20  . CATARACT EXTRACTION W/PHACO Left 11/04/2013   Procedure: CATARACT EXTRACTION PHACO AND INTRAOCULAR LENS PLACEMENT (IOC);  Surgeon: Tonny Branch, MD;  Location: [facility] OR[facility];  Service: Ophthalmology;  Laterality: Left;  CDE 8.57  . COLONOSCOPY N/A 09/19/2014   Procedure: COLONOSCOPY;  Surgeon: Rogene Houston, MD;  Location: [facility];  Service: Endoscopy;  Laterality: N/A;  7:30-8:30  . ESOPHAGOGASTRODUODENOSCOPY N/A 10/01/2013   Procedure: ESOPHAGOGASTRODUODENOSCOPY (EGD);  Surgeon: Rogene Houston, MD;  Location: [facility];  Service: Endoscopy;  Laterality: N/A;  120  .  ESOPHAGOGASTRODUODENOSCOPY N/A 10/24/2014   Procedure: ESOPHAGOGASTRODUODENOSCOPY (EGD);  Surgeon: Rogene Houston, MD;  [facility];  Service: Endoscopy;  Laterality: N/A;  730  . GIVENS CAPSULE STUDY N/A 10/24/2014   Procedure: GIVENS CAPSULE STUDY;  Surgeon: Rogene Houston, MD;  Location: [facility];  Service: Endoscopy;  Laterality: N/A;  . JOINT REPLACEMENT  2011   Right total knee replacement  . [place]  . KNEE ARTHROSCOPY  2009  . MALONEY DILATION N/A 10/01/2013   Procedure: Venia Minks DILATION;  Surgeon: Rogene Houston, MD;  Location: [facility];  Service: Endoscopy;  Laterality: N/A;  . SAVORY DILATION N/A 10/01/2013   Procedure: SAVORY DILATION;  Surgeon: Rogene Houston, MD;  Location[facility];  Service: Endoscopy;  Laterality: N/A;  . SPINE SURGERY  1997   C5-c6 fusion  . TOTAL ABDOMINAL HYSTERECTOMY  1973    OB History    Gravida Para Term Preterm AB Living   1 1 1          SAB TAB Ectopic Multiple Live Births           1       Home Medications    Prior to Admission medications   Medication Sig Start Date End Date Taking? Authorizing Provider  Alum Hydroxide-Mag Trisilicate (GAVISCON) [id number] MG CHEW Chew 2-3 tablets by mouth daily as needed (acid reflux).    Yes Historical Provider, MD  aspirin (ECOTRIN LOW STRENGTH) 81 MG EC tablet Take 81 mg by mouth daily. Swallow whole.   Yes Historical Provider, MD  B Complex-Biotin-FA (SUPER B-COMPLEX PO) Take 1 tablet by mouth daily.    Yes Historical Provider, MD  Calcium Citrate (CALCITRATE PO) Take 1 tablet by mouth daily.    Yes Historical Provider, MD  Cholecalciferol (VITAMIN D) 2000 units tablet Take 2,000 Units by mouth daily.   Yes Historical Provider, MD  cloNIDine (CATAPRES) 0.1 MG tablet Take 0.1 mg by mouth 3 (three) times daily.   Yes Historical Provider, MD  esomeprazole (NEXIUM) 40 MG capsule Take 40 mg by mouth daily before breakfast.   Yes Historical Provider, MD  hydrALAZINE (APRESOLINE) 25 MG tablet Take 25 mg by mouth daily.   Yes Historical Provider, MD  Insulin Glargine (LANTUS SOLOSTAR) 100 UNIT/ML Solostar Pen Inject 20 Units into the skin daily at 10 pm. 10/20/15  Yes Cassandria Anger, MD  levothyroxine (SYNTHROID, LEVOTHROID) 50 MCG tablet Take 50 mcg by mouth daily before breakfast.   Yes Historical Provider, MD  losartan (COZAAR) 100 MG tablet Take 100 mg by mouth daily.     Yes Historical  Provider, MD  Magnesium 250 MG TABS Take 1 tablet by mouth daily.    Yes Historical Provider, MD  methylcellulose (CITRUCEL) oral powder Take 3 packets by mouth 2 (two) times a week. 3 TBSP Twice Weekly   Yes Historical Provider, MD  Misc Natural Products (OSTEO BI-FLEX ADV JOINT SHIELD PO) Take 1 tablet by mouth every other day.    Yes Historical Provider, MD  Multiple Vitamin (MULTIVITAMIN) tablet Take 1 tablet by mouth daily.   Yes Historical Provider, MD  Multiple Vitamins-Minerals (PRESERVISION AREDS 2 PO) Take 2 tablets by mouth daily.   Yes Historical Provider, MD  NIFEdipine (PROCARDIA-XL/ADALAT-CC/NIFEDICAL-XL) 30 MG 24 hr tablet Take 30 mg by mouth daily.   Yes Historical Provider, MD  omega-3 acid ethyl esters (LOVAZA) 1 g capsule Take 1 g by mouth daily.    Yes  Historical Provider, MD  oxyCODONE-acetaminophen (PERCOCET/ROXICET) 5-325 MG per tablet Take 0.5 tablets by mouth every 4 (four) hours as needed for moderate pain or severe pain.    Yes Historical Provider, MD  simvastatin (ZOCOR) 20 MG tablet Take 10 mg by mouth at bedtime.    Yes Historical Provider, MD  torsemide (DEMADEX) 20 MG tablet Take 10-20 mg by mouth daily.    Yes Historical Provider, MD  traZODone (DESYREL) 50 MG tablet Take 100 mg by mouth at bedtime.    Yes Historical Provider, MD  cephALEXin (KEFLEX) 500 MG capsule Take 1 capsule (500 mg total) by mouth 4 (four) times daily. 11/17/15   Debra Bo, MD  Insulin Pen Needle (B-D ULTRAFINE III SHORT PEN) 31G X 8 MM MISC 1 each by Does not apply route as directed. 10/20/15   Cassandria Anger, MD    Family History Family History  Problem Relation Age of Onset  . Stroke Mother   . Hypertension Mother   . Stroke Father   . Hypertension Father   . Diabetes Father   . Coronary artery disease Father   . Diabetes Sister   . Hypertension Sister   . Lung cancer Brother   . Sinusitis Brother   . Healthy Brother   . Diabetes Son   . Hypertension Son   . Coronary  artery disease Other     Social History Social History  Substance Use Topics  . Smoking status: Former Smoker    Types: Cigarettes    Quit date: 03/11/1985  . Smokeless tobacco: Never Used  . Alcohol use No     Allergies   Amoxicillin-pot clavulanate; Amoxicillin-pot clavulanate; Ciprofloxacin; Erythromycin; Motrin [ibuprofen]; Naproxen; Prednisone; and Sulfamethoxazole-trimethoprim   Review of Systems Review of Systems  Gastrointestinal: Negative for vomiting.  Genitourinary: Positive for dysuria and urgency. Negative for hematuria.  All other systems reviewed and are negative.    Physical Exam Updated Vital Signs BP (!) 148/53 (BP Location: Left Arm)   Pulse 84   Temp 98.1 F (36.7 C) (Oral)   Resp 18   Ht 5\' 3"  (1.6 m)   Wt 199 lb (90.3 kg)   SpO2 98%   BMI 35.25 kg/m   Physical Exam  Constitutional: She is oriented to person, place, and time. She appears well-developed and well-nourished.  HENT:  Head: Normocephalic and atraumatic.  Right Ear: External ear normal.  Left Ear: External ear normal.  Eyes: Conjunctivae and EOM are normal. Pupils are equal, round, and reactive to light.  Neck: Normal range of motion and phonation normal. Neck supple.  Cardiovascular: Normal rate, regular rhythm and normal heart sounds.   Pulmonary/Chest: Effort normal and breath sounds normal. She exhibits no bony tenderness.  Abdominal: Soft. There is tenderness.  Bilateral lower quadrant hasmild TTP.   Musculoskeletal: Normal range of motion.  +1 edema and BLE TTP that is mild.   Neurological: She is alert and oriented to person, place, and time. No cranial nerve deficit or sensory deficit. She exhibits normal muscle tone. Coordination normal.  No CVA TTP.   Skin: Skin is warm, dry and intact.  Psychiatric: She has a normal mood and affect. Her behavior is normal. Judgment and thought content normal.  Nursing note and vitals reviewed.    ED Treatments / Results  DIAGNOSTIC  STUDIES: Oxygen Saturation is 98% on RA, normal by my interpretation.    COORDINATION OF CARE: 12:38 PM- Pt advised of plan for treatment and pt agrees.  Labs (all  labs ordered are listed, but only abnormal results are displayed) Labs Reviewed  URINALYSIS, ROUTINE W REFLEX MICROSCOPIC (NOT AT [facility]) - Abnormal; Notable for the following:       Result Value   Color, Urine ORANGE (*)    Glucose, UA 100 (*)    Protein, ur TRACE (*)    Nitrite POSITIVE (*)    Leukocytes, UA SMALL (*)    All other components within normal limits  BASIC METABOLIC PANEL - Abnormal; Notable for the following:    Glucose, Bld 115 (*)    BUN 41 (*)    Creatinine, Ser 1.19 (*)    GFR calc non Af Amer 43 (*)    GFR calc Af Amer 50 (*)    Anion gap 3 (*)    All other components within normal limits  CBC WITH DIFFERENTIAL/PLATELET - Abnormal; Notable for the following:    RBC 3.04 (*)    Hemoglobin 9.7 (*)    HCT 28.7 (*)    RDW 16.3 (*)    Platelets 495 (*)    All other components within normal limits  URINE MICROSCOPIC-ADD ON - Abnormal; Notable for the following:    Squamous Epithelial / LPF 6-30 (*)    Bacteria, UA MANY (*)    Casts HYALINE CASTS (*)    All other components within normal limits  URINE CULTURE    EKG  EKG Interpretation None       Radiology No results found.  Procedures Procedures (including critical care time)  Medications Ordered in ED Medications - No data to display   Initial Impression / Assessment and Plan / ED Course  I have reviewed the triage vital signs and the nursing notes.  Pertinent labs & imaging results that were available during my care of the patient were reviewed by me and considered in my medical decision making (see chart for details).  Clinical Course    Medications - No data to display  Patient Vitals for the past 24 hrs:  BP Temp Temp src Pulse Resp SpO2 Height Weight  11/17/15 1504 (!) 130/54 97.9 F (36.6 C) Oral 83 20 97 % - -    11/17/15 1105 - - - - - - 5\' 3"  (1.6 m) 199 lb (90.3 kg)  11/17/15 1056 (!) 148/53 98.1 F (36.7 C) Oral 84 18 98 % - -    At D/C Reevaluation with update and discussion. After initial assessment and treatment, an updated evaluation reveals She remains comfortable. Findings discussed with the patient, and all questions answered. Shanele Nissan L    Final Clinical Impressions(s) / ED Diagnoses   Final diagnoses:  UTI (lower urinary tract infection)    Uncomplicated urinary tract infection with prolonged symptoms. Doubt serious bacterial infection, metabolic instability or impending vascular collapse.  Nursing Notes Reviewed/ Care Coordinated Applicable Imaging Reviewed Interpretation of Laboratory Data incorporated into ED treatment  The patient appears reasonably screened and/or stabilized for discharge and I doubt any other medical condition or other [facility] requiring further screening, evaluation, or treatment in the ED at this time prior to discharge.  Plan: Home Medications- continue; Home Treatments- rest; return here if the recommended treatment, does not improve the symptoms; Recommended follow up- PCP prn   New Prescriptions Discharge Medication List as of 11/17/2015  2:57 PM    START taking these medications   Details  cephALEXin (KEFLEX) 500 MG capsule Take 1 capsule (500 mg total) by mouth 4 (four) times daily., Starting Fri 11/17/2015, Print  I personally performed the services described in this documentation, which was scribed in my presence. The recorded information has been reviewed and is accurate.     Debra Bo, MD 11/17/15 2144

## 2015-11-17 NOTE — ED Notes (Signed)
Pt states that she has been on AZO for three days now. This incident occurred when pt was started on a new diabetes medication. Pt states that she existed all the s/s of a bladder and vaginal infection after just one dose of this medication. She then followed up with PCP and GYN where she was told to continue to take that medication but was prescribed a hormonal vaginal creme as well. Pt states that she has had two urine test done recently and both test didn't show anything.  Pt states that she feels pressure and sharp glass like pain in bladder.

## 2015-11-17 NOTE — ED Triage Notes (Signed)
Patient states the she has all the symptoms of a UTI since she started taking Jardiance on July 26. Patient reports frequency, pain and pressure with urination. Per patient stopped taking medication after taking first dose due to palpitations and feeling of bladder infection. Per patient she has had bladder scan and UAs that have not shown anything other than "bladder dropping slightly." Patient started on Myrbetriq and Estrace vaginal cream with no release. Denies any nausea, vomiting, or fevers. Patient does states she has chills and sharp pain in lower abd with bending.

## 2015-11-20 ENCOUNTER — Encounter: Payer: Self-pay | Admitting: "Endocrinology

## 2015-11-20 ENCOUNTER — Ambulatory Visit (INDEPENDENT_AMBULATORY_CARE_PROVIDER_SITE_OTHER): Payer: Medicare Other | Admitting: "Endocrinology

## 2015-11-20 VITALS — BP 146/70 | HR 73 | Ht 63.0 in | Wt 199.4 lb

## 2015-11-20 DIAGNOSIS — E1122 Type 2 diabetes mellitus with diabetic chronic kidney disease: Secondary | ICD-10-CM

## 2015-11-20 DIAGNOSIS — E669 Obesity, unspecified: Secondary | ICD-10-CM

## 2015-11-20 DIAGNOSIS — E038 Other specified hypothyroidism: Secondary | ICD-10-CM

## 2015-11-20 DIAGNOSIS — I10 Essential (primary) hypertension: Secondary | ICD-10-CM

## 2015-11-20 DIAGNOSIS — E785 Hyperlipidemia, unspecified: Secondary | ICD-10-CM

## 2015-11-20 DIAGNOSIS — N183 Chronic kidney disease, stage 3 (moderate): Secondary | ICD-10-CM

## 2015-11-20 LAB — URINE CULTURE

## 2015-11-20 NOTE — Patient Instructions (Signed)

## 2015-11-20 NOTE — Progress Notes (Signed)
Subjective:    Patient ID: Debra Hopkins, female    DOB: January 23, 1941. Patient is being seen in f/u for management of diabetes requested by  [facility], MD  Past Medical History:  Diagnosis Date  . Arthritis   . CAD (coronary artery disease)   . Carotid artery occlusion   . Cataracts, bilateral   . DDD (degenerative disc disease)   . Diabetes mellitus    Type 2  . Diverticulitis   . Dyspnea   . Fibromyalgia   . GERD (gastroesophageal reflux disease)   . Hemorrhoids   . History of shingles   . Impaired hearing   . Macular degeneration   . Other and unspecified hyperlipidemia   . PVD (peripheral vascular disease) ([place])   . TIA (transient ischemic attack)   . Unspecified essential hypertension   . Varicose veins    Past Surgical History:  Procedure Laterality Date  . ANGIOPLASTY     left leg 2003  . APPENDECTOMY    . aspiration of a cyst in the right breast    . BALLOON DILATION N/A 10/01/2013   Procedure: BALLOON DILATION;  Surgeon: Rogene Houston, MD;  Location: [facility];  Service: Endoscopy;  Laterality: N/A;  . BREAST BIOPSY    . BREAST BIOPSY Left   . CARDIAC CATHETERIZATION    . CAROTID ENDARTERECTOMY  12-03-06   left CEA  . CATARACT EXTRACTION W/PHACO Right 10/14/2013   Procedure: CATARACT EXTRACTION PHACO AND INTRAOCULAR LENS PLACEMENT (IOC);  Surgeon: Tonny Branch, MD;  Location: [facility] ORS;  Service: Ophthalmology;  Laterality: Right;  CDE 7.20  . CATARACT EXTRACTION W/PHACO Left 11/04/2013   Procedure: CATARACT EXTRACTION PHACO AND INTRAOCULAR LENS PLACEMENT (IOC);  Surgeon: Tonny Branch, MD;  Location: [facility] OR[facility];  Service: Ophthalmology;  Laterality: Left;  CDE 8.57  . COLONOSCOPY N/A 09/19/2014   Procedure: COLONOSCOPY;  Surgeon: Rogene Houston, MD;  Location: [facility];  Service: Endoscopy;  Laterality: N/A;  7:30-8:30  . ESOPHAGOGASTRODUODENOSCOPY N/A 10/01/2013   Procedure: ESOPHAGOGASTRODUODENOSCOPY (EGD);  Surgeon: Rogene Houston, MD;  Location: [facility];   Service: Endoscopy;  Laterality: N/A;  120  . ESOPHAGOGASTRODUODENOSCOPY N/A 10/24/2014   Procedure: ESOPHAGOGASTRODUODENOSCOPY (EGD);  Surgeon: Rogene Houston, MD;  [facility];  Service: Endoscopy;  Laterality: N/A;  730  . GIVENS CAPSULE STUDY N/A 10/24/2014   Procedure: GIVENS CAPSULE STUDY;  Surgeon: Rogene Houston, MD;  Location: [facility];  Service: Endoscopy;  Laterality: N/A;  . JOINT REPLACEMENT  2011   Right total knee replacement  . [place]  . KNEE ARTHROSCOPY  2009  . MALONEY DILATION N/A 10/01/2013   Procedure: Venia Minks DILATION;  Surgeon: Rogene Houston, MD;  [facility];  Service: Endoscopy;  Laterality: N/A;  . SAVORY DILATION N/A 10/01/2013   Procedure: SAVORY DILATION;  Surgeon: Rogene Houston, MD;  Location: [facility];  Service: Endoscopy;  Laterality: N/A;  . SPINE SURGERY  1997   C5-c6 fusion  . TOTAL ABDOMINAL HYSTERECTOMY  1973   Social History   Social History  . Marital status: Widowed    Spouse name: N/A  . Number of children: N/A  . Years of education: N/A   Social History Main Topics  . Smoking status: Former Smoker    Types: Cigarettes    Quit date: 03/11/1985  . Smokeless tobacco: Never Used  . Alcohol use No  . Drug use: No  . Sexual activity:  Not Asked   Other Topics Concern  . None   Social History Narrative   Retired. Married.    Outpatient Encounter Prescriptions as of 11/20/2015  Medication Sig  . Alum Hydroxide-Mag Trisilicate (GAVISCON) 35-59.7 MG CHEW Chew 2-3 tablets by mouth daily as needed (acid reflux).   Marland Kitchen aspirin (ECOTRIN LOW STRENGTH) 81 MG EC tablet Take 81 mg by mouth daily. Swallow whole.  . B Complex-Biotin-FA (SUPER B-COMPLEX PO) Take 1 tablet by mouth daily.   . Calcium Citrate (CALCITRATE PO) Take 1 tablet by mouth daily.   . cephALEXin (KEFLEX) 500 MG capsule Take 1 capsule (500 mg total) by mouth 4 (four) times daily.  . Cholecalciferol (VITAMIN D) 2000 units tablet  Take 2,000 Units by mouth daily.  . cloNIDine (CATAPRES) 0.1 MG tablet Take 0.1 mg by mouth 3 (three) times daily.  Marland Kitchen esomeprazole (NEXIUM) 40 MG capsule Take 40 mg by mouth daily before breakfast.  . hydrALAZINE (APRESOLINE) 25 MG tablet Take 25 mg by mouth daily.  . Insulin Glargine (LANTUS SOLOSTAR) 100 UNIT/ML Solostar Pen Inject 20 Units into the skin daily at 10 pm.  . Insulin Pen Needle (B-D ULTRAFINE III SHORT PEN) 31G X 8 MM MISC 1 each by Does not apply route as directed.  Marland Kitchen levothyroxine (SYNTHROID, LEVOTHROID) 50 MCG tablet Take 50 mcg by mouth daily before breakfast.  . losartan (COZAAR) 100 MG tablet Take 100 mg by mouth daily.    . Magnesium 250 MG TABS Take 1 tablet by mouth daily.   . methylcellulose (CITRUCEL) oral powder Take 3 packets by mouth 2 (two) times a week. 3 TBSP Twice Weekly  . Misc Natural Products (OSTEO BI-FLEX ADV JOINT SHIELD PO) Take 1 tablet by mouth every other day.   . Multiple Vitamin (MULTIVITAMIN) tablet Take 1 tablet by mouth daily.  . Multiple Vitamins-Minerals (PRESERVISION AREDS 2 PO) Take 2 tablets by mouth daily.  Marland Kitchen NIFEdipine (PROCARDIA-XL/ADALAT-CC/NIFEDICAL-XL) 30 MG 24 hr tablet Take 30 mg by mouth daily.  Marland Kitchen omega-3 acid ethyl esters (LOVAZA) 1 g capsule Take 1 g by mouth daily.   Marland Kitchen oxyCODONE-acetaminophen (PERCOCET/ROXICET) 5-325 MG per tablet Take 0.5 tablets by mouth every 4 (four) hours as needed for moderate pain or severe pain.   . simvastatin (ZOCOR) 20 MG tablet Take 10 mg by mouth at bedtime.   . torsemide (DEMADEX) 20 MG tablet Take 10-20 mg by mouth daily.   . traZODone (DESYREL) 50 MG tablet Take 100 mg by mouth at bedtime.    No facility-administered encounter medications on file as of 11/20/2015.    ALLERGIES: Allergies  Allergen Reactions  . Amoxicillin-Pot Clavulanate     REACTION: hives  . Amoxicillin-Pot Clavulanate Hives  . Ciprofloxacin Nausea Only    Dizziness  . Erythromycin     REACTION: Upset stomach  . Motrin  [Ibuprofen]     REACTION: nausea  . Naproxen     REACTION: severe nausea  . Prednisone     REACTION: itchy rash  . Sulfamethoxazole-Trimethoprim     REACTION: Upset stomach   VACCINATION STATUS:  There is no immunization history on file for this patient.  Diabetes  She presents for her follow-up diabetic visit. She has type 2 diabetes mellitus. Onset time: She was diagnosed at approximate age of 35 years. Her disease course has been worsening. There are no hypoglycemic associated symptoms. Pertinent negatives for hypoglycemia include no confusion, headaches, pallor or seizures. Associated symptoms include fatigue. Pertinent negatives for diabetes include no chest  pain, no polydipsia, no polyphagia and no polyuria. There are no hypoglycemic complications. Symptoms are worsening. Diabetic complications include nephropathy. Risk factors for coronary artery disease include dyslipidemia, diabetes mellitus, hypertension, obesity, sedentary lifestyle and tobacco exposure (She smoked for 28 years before she quit.). Current diabetic treatment includes oral agent (monotherapy). Her weight is stable. She is following a generally unhealthy diet. When asked about meal planning, she reported none. She has not had a previous visit with a dietitian. She rarely participates in exercise. Her home blood glucose trend is increasing steadily (She did bring her meter and  logs , showing EAG of 200+ monitoring 4 x a day .). Her breakfast blood glucose range is generally 140-180 mg/dl. Her lunch blood glucose range is generally 140-180 mg/dl. Her dinner blood glucose range is generally 140-180 mg/dl. Her overall blood glucose range is 140-180 mg/dl. An ACE inhibitor/angiotensin II receptor blocker is being taken. Eye exam is current.  Hyperlipidemia  This is a chronic problem. The current episode started more than 1 year ago. The problem is controlled. Recent lipid tests were reviewed and are variable. Exacerbating diseases  include diabetes, hypothyroidism and obesity. Pertinent negatives include no chest pain, myalgias or shortness of breath. Current antihyperlipidemic treatment includes statins. Risk factors for coronary artery disease include diabetes mellitus, dyslipidemia, hypertension, family history, obesity and a sedentary lifestyle.  Hypertension  This is a chronic problem. The current episode started more than 1 year ago. The problem is controlled. Pertinent negatives include no chest pain, headaches, palpitations or shortness of breath. Risk factors for coronary artery disease include diabetes mellitus, dyslipidemia, obesity, sedentary lifestyle and smoking/tobacco exposure. Past treatments include angiotensin blockers. Hypertensive end-organ damage includes kidney disease.    Review of Systems  Constitutional: Positive for fatigue. Negative for unexpected weight change.  HENT: Negative for trouble swallowing and voice change.   Eyes: Negative for visual disturbance.  Respiratory: Negative for cough, shortness of breath and wheezing.   Cardiovascular: Negative for chest pain, palpitations and leg swelling.  Gastrointestinal: Negative for diarrhea, nausea and vomiting.  Endocrine: Negative for cold intolerance, heat intolerance, polydipsia, polyphagia and polyuria.  Genitourinary: Positive for frequency. Negative for dysuria and flank pain.  Musculoskeletal: Negative for arthralgias and myalgias.  Skin: Negative for color change, pallor, rash and wound.  Neurological: Negative for seizures and headaches.  Psychiatric/Behavioral: Negative for confusion and suicidal ideas.    Objective:    BP (!) 146/70   Pulse 73   Ht 5' 3"  (1.6 m)   Wt 199 lb 6.4 oz (90.4 kg)   BMI 35.32 kg/m   Wt Readings from Last 3 Encounters:  11/20/15 199 lb 6.4 oz (90.4 kg)  11/17/15 199 lb (90.3 kg)  10/20/15 201 lb (91.2 kg)    Physical Exam  Constitutional: She is oriented to person, place, and time. She appears  well-developed.  HENT:  Head: Normocephalic and atraumatic.  Eyes: EOM are normal.  Neck: Normal range of motion. Neck supple. No tracheal deviation present. No thyromegaly present.  Cardiovascular: Normal rate and regular rhythm.   Pulmonary/Chest: Effort normal and breath sounds normal.  Abdominal: Soft. Bowel sounds are normal. There is no tenderness. There is no guarding.  Musculoskeletal: Normal range of motion. She exhibits no edema.  Neurological: She is alert and oriented to person, place, and time. She has normal reflexes. No cranial nerve deficit. Coordination normal.  Skin: Skin is warm and dry. No rash noted. No erythema. No pallor.  Psychiatric: She has a normal  mood and affect. Judgment normal.    CMP     Component Value Date/Time   NA 138 11/17/2015 1228   NA 139 11/15/2015 1043   K 4.4 11/17/2015 1228   CL 106 11/17/2015 1228   CO2 29 11/17/2015 1228   GLUCOSE 115 (H) 11/17/2015 1228   BUN 41 (H) 11/17/2015 1228   BUN 30 (H) 11/15/2015 1043   CREATININE 1.19 (H) 11/17/2015 1228   CALCIUM 9.3 11/17/2015 1228   PROT 6.2 11/15/2015 1043   ALBUMIN 4.4 11/15/2015 1043   AST 13 11/15/2015 1043   ALT 19 11/15/2015 1043   ALKPHOS 63 11/15/2015 1043   BILITOT 0.4 11/15/2015 1043   GFRNONAA 43 (L) 11/17/2015 1228   GFRAA 50 (L) 11/17/2015 1228   Recent Results (from the past 2160 hour(s))  CMP14+EGFR     Status: Abnormal   Collection Time: 11/15/15 10:43 AM  Result Value Ref Range   Glucose 245 (H) 65 - 99 mg/dL   BUN 30 (H) 8 - 27 mg/dL   Creatinine, Ser 1.15 (H) 0.57 - 1.00 mg/dL   GFR calc non Af Amer 46 (L) >59 mL/min/1.73   GFR calc Af Amer 53 (L) >59 mL/min/1.73   BUN/Creatinine Ratio 26 12 - 28   Sodium 139 134 - 144 mmol/L   Potassium 4.3 3.5 - 5.2 mmol/L   Chloride 99 96 - 106 mmol/L   CO2 24 18 - 29 mmol/L   Calcium 9.3 8.7 - 10.3 mg/dL   Total Protein 6.2 6.0 - 8.5 g/dL   Albumin 4.4 3.5 - 4.8 g/dL   Globulin, Total 1.8 1.5 - 4.5 g/dL    Albumin/Globulin Ratio 2.4 (H) 1.2 - 2.2   Bilirubin Total 0.4 0.0 - 1.2 mg/dL   Alkaline Phosphatase 63 39 - 117 IU/L   AST 13 0 - 40 IU/L   ALT 19 0 - 32 IU/L  Hemoglobin A1c     Status: Abnormal   Collection Time: 11/15/15 10:43 AM  Result Value Ref Range   Hgb A1c MFr Bld 6.8 (H) 4.8 - 5.6 %    Comment:          Pre-diabetes: 5.7 - 6.4          Diabetes: >6.4          Glycemic control for adults with diabetes: <7.0    Est. average glucose Bld gHb Est-mCnc 148 mg/dL  Microalbumin / creatinine urine ratio     Status: Abnormal   Collection Time: 11/15/15 10:43 AM  Result Value Ref Range   Creatinine, Urine 50.1 Not Estab. mg/dL   Microalbum.,U,Random 32.6 Not Estab. ug/mL   MICROALB/CREAT RATIO 65.1 (H) 0.0 - 30.0 mg/g creat  Urinalysis, Routine w reflex microscopic (not at [facility])     Status: Abnormal   Collection Time: 11/17/15 11:11 AM  Result Value Ref Range   Color, Urine ORANGE (A) YELLOW    Comment: BIOCHEMICALS MAY BE AFFECTED BY COLOR   APPearance CLEAR CLEAR   Specific Gravity, Urine 1.010 1.005 - 1.030   pH 5.5 5.0 - 8.0   Glucose, UA 100 (A) NEGATIVE mg/dL   Hgb urine dipstick NEGATIVE NEGATIVE   Bilirubin Urine NEGATIVE NEGATIVE   Ketones, ur NEGATIVE NEGATIVE mg/dL   Protein, ur TRACE (A) NEGATIVE mg/dL   Nitrite POSITIVE (A) NEGATIVE   Leukocytes, UA SMALL (A) NEGATIVE  Urine microscopic-add on     Status: Abnormal   Collection Time: 11/17/15 11:11 AM  Result Value Ref Range  Squamous Epithelial / LPF 6-30 (A) NONE SEEN   WBC, UA 6-30 0 - 5 WBC/hpf   RBC / HPF 0-5 0 - 5 RBC/hpf   Bacteria, UA MANY (A) NONE SEEN   Casts HYALINE CASTS (A) NEGATIVE  Urine culture     Status: Abnormal   Collection Time: 11/17/15 11:11 AM  Result Value Ref Range   Specimen Description URINE, CLEAN CATCH    Special Requests NONE    Culture >=100,000 COLONIES/mL ESCHERICHIA COLI (A)    Report Status 11/20/2015 FINAL    Organism ID, Bacteria ESCHERICHIA COLI (A)        Susceptibility   Escherichia coli - MIC*    AMPICILLIN >=32 RESISTANT Resistant     CEFAZOLIN >=64 RESISTANT Resistant     CEFTRIAXONE 8 SENSITIVE Sensitive     CIPROFLOXACIN <=0.25 SENSITIVE Sensitive     GENTAMICIN <=1 SENSITIVE Sensitive     IMIPENEM <=0.25 SENSITIVE Sensitive     NITROFURANTOIN <=16 SENSITIVE Sensitive     TRIMETH/SULFA <=20 SENSITIVE Sensitive     AMPICILLIN/SULBACTAM 16 INTERMEDIATE Intermediate     PIP/TAZO <=4 SENSITIVE Sensitive     Extended ESBL NEGATIVE Sensitive     * >=100,000 COLONIES/mL ESCHERICHIA COLI  Basic metabolic panel     Status: Abnormal   Collection Time: 11/17/15 12:28 PM  Result Value Ref Range   Sodium 138 135 - 145 mmol/L   Potassium 4.4 3.5 - 5.1 mmol/L   Chloride 106 101 - 111 mmol/L   CO2 29 22 - 32 mmol/L   Glucose, Bld 115 (H) 65 - 99 mg/dL   BUN 41 (H) 6 - 20 mg/dL   Creatinine, Ser 1.19 (H) 0.44 - 1.00 mg/dL   Calcium 9.3 8.9 - 10.3 mg/dL   GFR calc non Af Amer 43 (L) >60 mL/min   GFR calc Af Amer 50 (L) >60 mL/min    Comment: (NOTE) The eGFR has been calculated using the CKD EPI equation. This calculation has not been validated in all clinical situations. eGFR's persistently <60 mL/min signify possible Chronic Kidney Disease.    Anion gap 3 (L) 5 - 15  CBC with Differential     Status: Abnormal   Collection Time: 11/17/15 12:28 PM  Result Value Ref Range   WBC 5.6 4.0 - 10.5 K/uL   RBC 3.04 (L) 3.87 - 5.11 MIL/uL   Hemoglobin 9.7 (L) 12.0 - 15.0 g/dL   HCT 28.7 (L) 36.0 - 46.0 %   MCV 94.4 78.0 - 100.0 fL   MCH 31.9 26.0 - 34.0 pg   MCHC 33.8 30.0 - 36.0 g/dL   RDW 16.3 (H) 11.5 - 15.5 %   Platelets 495 (H) 150 - 400 K/uL   Neutrophils Relative % 61 %   Neutro Abs 3.4 1.7 - 7.7 K/uL   Lymphocytes Relative 28 %   Lymphs Abs 1.6 0.7 - 4.0 K/uL   Monocytes Relative 10 %   Monocytes Absolute 0.6 0.1 - 1.0 K/uL   Eosinophils Relative 1 %   Eosinophils Absolute 0.1 0.0 - 0.7 K/uL   Basophils Relative 0 %    Basophils Absolute 0.0 0.0 - 0.1 K/uL   WBC Morphology ATYPICAL LYMPHOCYTES       Assessment & Plan:   1. Type 2 diabetes mellitus with stage 3 chronic kidney disease, without long-term current use of insulin ([place])  - Patient has currently uncontrolled symptomatic type 2 DM since  75 years of age,  with most recent A1c  6.8% improving from  8.4 %. Recent labs reviewed.   Her diabetes is complicated by chronic kidney disease stage 3 and patient remains at a high risk for more acute and chronic complications of diabetes which include CAD, CVA, CKD, retinopathy, and neuropathy. These are all discussed in detail with the patient.  - I have counseled the patient on diet management and weight loss, by adopting a carbohydrate restricted/protein rich diet.  - Suggestion is made for patient to avoid simple carbohydrates   from their diet including Cakes , Desserts, Ice Cream,  Soda (  diet and regular) , Sweet Tea , Candies,  Chips, Cookies, Artificial Sweeteners,   and "Sugar-free" Products . This will help patient to have stable blood glucose profile and potentially avoid unintended weight gain.  - I encouraged the patient to switch to  unprocessed or minimally processed complex starch and increased protein intake (animal or plant source), fruits, and vegetables.  - Patient is advised to stick to a routine mealtimes to eat 3 meals  a day and avoid unnecessary snacks ( to snack only to correct hypoglycemia).  - The patient will be scheduled with Jearld Fenton, RDN, CDE for individualized DM education.  - I have approached patient with the following individualized plan to manage diabetes and patient agrees:   - She will  continue to need basal  insulin treatment given the fact that she does not have good oral option due to CK D. - she became comfortable using insulin.  -I will proceed with 20 units of Lantus daily at bedtime  associated with monitoring of blood glucose twice a day before breakfast  and at bedtime.  - Her GFR is at 44, she has a chance to resume metformin therapy if GFR improved to about 50 . - She is not a candidate for SGLT2 I, . - She will be  be considered for incretin therapy as appropriate next visit. - Patient specific target  A1c;  LDL, HDL, Triglycerides, and  Waist Circumference were discussed in detail.  2) BP/HTN: controlled. Continue current medications including ACEI/ARB. 3) Lipids/HPL: controlled, continue statins. 4)  Weight/Diet: CDE Consult will be initiated , exercise, and detailed carbohydrates information provided.  5) Chronic Care/Health Maintenance:  -Patient is on ACEI/ARB and Statin medications and encouraged to continue to follow up with Ophthalmology, Podiatrist at least yearly or according to recommendations, and advised to   stay away from smoking. I have recommended yearly flu vaccine and pneumonia vaccination at least every 5 years; moderate intensity exercise for up to 150 minutes weekly; and  sleep for at least 7 hours a day.  - 30 minutes of time was spent on the care of this patient , 50% of which was applied for counseling on diabetes complications and their preventions.  - Patient to bring meter and  blood glucose logs during their next visit.   - I advised patient to maintain close follow up with [facility], MD for primary care needs.  Follow up plan: - Return in about 3 months (around 02/19/2016) for follow up with pre-visit labs, meter, and logs.  Glade Lloyd, MD Phone: [phone]  Fax: [phone]   11/20/2015, 3:31 PM

## 2015-11-21 ENCOUNTER — Telehealth (HOSPITAL_COMMUNITY): Payer: Self-pay

## 2015-11-21 NOTE — Telephone Encounter (Signed)
Post ED Visit - Positive Culture Follow-up: Successful Patient Follow-Up  Culture assessed and recommendations reviewed by: []  Elenor Quinones, Pharm.D. []  Heide Guile, Pharm.D., BCPS []  Parks Neptune, Pharm.D. []  Alycia Rossetti, Pharm.D., BCPS []  [place], Pharm.D., BCPS, AAHIVP []  Legrand Como, Pharm.D., BCPS, AAHIVP []  Milus Glazier, Pharm.D. []  Stephens November, [place].D. Shanon Rosser, Pharm.D.  Positive urine culture, >/= 100,000 colonies -> E Coli  []  Patient discharged without antimicrobial prescription and treatment is now indicated [x]  Organism is resistant to prescribed ED discharge antimicrobial(Cephalexin) []  Patient with positive blood cultures  Changes discussed with ED provider:  M. Camprubi-Soms  PA New antibiotic prescription "Levaquin 250 mg QD x 3 days" Called to   Contacted patient, date 11/21/2015, time 15:15  LVM requesting callback   Dortha Kern 11/21/2015, 3:18 PM

## 2015-11-21 NOTE — Progress Notes (Signed)
ED Antimicrobial Stewardship Positive Culture Follow Up   Debra Hopkins is an 75 y.o. female who presented to [facility] on 11/17/2015 with a chief complaint of  Chief Complaint  Patient presents with  . Dysuria    Recent Results (from the past 720 hour(s))  Urine culture     Status: Abnormal   Collection Time: 11/17/15 11:11 AM  Result Value Ref Range Status   Specimen Description URINE, CLEAN CATCH  Final   Special Requests NONE  Final   Culture >=100,000 COLONIES/mL ESCHERICHIA COLI (A)  Final   Report Status 11/20/2015 FINAL  Final   Organism ID, Bacteria ESCHERICHIA COLI (A)  Final      Susceptibility   Escherichia coli - MIC*    AMPICILLIN >=32 RESISTANT Resistant     CEFAZOLIN >=64 RESISTANT Resistant     CEFTRIAXONE 8 SENSITIVE Sensitive     CIPROFLOXACIN <=0.25 SENSITIVE Sensitive     GENTAMICIN <=1 SENSITIVE Sensitive     IMIPENEM <=0.25 SENSITIVE Sensitive     NITROFURANTOIN <=16 SENSITIVE Sensitive     TRIMETH/SULFA <=20 SENSITIVE Sensitive     AMPICILLIN/SULBACTAM 16 INTERMEDIATE Intermediate     PIP/TAZO <=4 SENSITIVE Sensitive     Extended ESBL NEGATIVE Sensitive     * >=100,000 COLONIES/mL ESCHERICHIA COLI    [x]  Treated with cephalexin, organism resistant to prescribed antimicrobial   New antibiotic prescription: Levaquin 250 mg every day for 3 days.  ED Provider: Freddi Starr- Soms, PA-C   Debra Hopkins 11/21/2015, 10:21 AM Pharmacy Resident  Phone# [phone]

## 2015-11-23 ENCOUNTER — Encounter: Payer: Medicare Other | Attending: "Endocrinology | Admitting: Nutrition

## 2015-11-23 ENCOUNTER — Encounter: Payer: Self-pay | Admitting: Nutrition

## 2015-11-23 VITALS — Ht 63.0 in | Wt 199.0 lb

## 2015-11-23 DIAGNOSIS — Z794 Long term (current) use of insulin: Secondary | ICD-10-CM

## 2015-11-23 DIAGNOSIS — E1165 Type 2 diabetes mellitus with hyperglycemia: Secondary | ICD-10-CM

## 2015-11-23 DIAGNOSIS — N183 Chronic kidney disease, stage 3 (moderate): Secondary | ICD-10-CM | POA: Insufficient documentation

## 2015-11-23 DIAGNOSIS — E1122 Type 2 diabetes mellitus with diabetic chronic kidney disease: Secondary | ICD-10-CM | POA: Diagnosis not present

## 2015-11-23 DIAGNOSIS — E118 Type 2 diabetes mellitus with unspecified complications: Secondary | ICD-10-CM

## 2015-11-23 DIAGNOSIS — E669 Obesity, unspecified: Secondary | ICD-10-CM

## 2015-11-23 DIAGNOSIS — IMO0002 Reserved for concepts with insufficient information to code with codable children: Secondary | ICD-10-CM

## 2015-11-23 NOTE — Progress Notes (Signed)
  Medical Nutrition Therapy:  Appt start time: 1330 end time:  1430.  Assessment:  Primary concerns today: Diabetes Type 2. Lives by herself.  PMH: Stg 3 CKD.  Eat meals out and at home. Eats three meals per day. No snacks. Insulin Lantus 20 units daily. Was on Jardiance and it caused severe bladder and yeast infection. Stopped it. Sees Dr. Dorris Fetch for Endo.Marland Kitchen  FBS 140's. Willing to get back to riding her exercise bike and walk some. A1C was  8.4% and now down to 6.8%.   Diet is inconsistent in carbs, needs more low carb vegetables and fresh fruits. Drinking water.    Preferred Learning Style:    No preference indicated   Learning Readiness:   Ready  Change in progress   MEDICATIONS:   DIETARY INTAKE:     24-hr recall:  B ( AM): Special K and banana or cinnamon bread water Snk ( AM): none  L ( PM): Ham slices, swiss cheese on sf bread 2 slices,  water Snk ( PM): none D ( PM): Beef stew, cabbage and 1 corn muffin, water Snk ( PM):  Beverages: water,   Usual physical activity:  ADL.  Estimated energy needs: 1200-1500 calories 135 g carbohydrates 90 g protein 33 g fat  Progress Towards Goal(s):  In progress.   Nutritional Diagnosis:  NB-1.1 Food and nutrition-related knowledge deficit As related to Diabetes.  As evidenced by 8.4%..    Intervention: Nutrition and Diabetes education provided on My Plate, CHO counting, meal planning, portion sizes, timing of meals, avoiding snacks between meals unless having a low blood sugar, target ranges for A1C and blood sugars, signs/symptoms and treatment of hyper/hypoglycemia, monitoring blood sugars, taking medications as prescribed, benefits of exercising 30 minutes per day and prevention of complications of DM.  Goals 1. Follow My Plate 2. Exercise 15 minutes three days a week. 3 Increase fresh fruits and low carb vegetables. 4. Eat 30-45 gram of carbs per meals. 5. Drinking water 6. Eat 1 cup of yogart a day to help recover from  yeast infection. 7. Get A1C to 6%   Teaching Method Utilized: Visual Auditory Hands on  Handouts given during visit include:  The Plate Method  Meal Plan Card   Barriers to learning/adherence to lifestyle change: none  Demonstrated degree of understanding via:  Teach Back   Monitoring/Evaluation:  Dietary intake, exercise, meal planning, SBG, and body weight in 3 month(s).

## 2015-11-23 NOTE — Patient Instructions (Signed)
Goals 1. Follow My Plate 2. Exercise 15 minutes three days a week. 3 Increase fresh fruits and low carb vegetables. 4. Eat 30-45 gram of carbs per meals. 5. Drinking water 6. Eat 1 cup of yogart a day to help recover from yeast infection. 7. Get A1C to 6%

## 2015-11-29 ENCOUNTER — Encounter: Payer: Self-pay | Admitting: "Endocrinology

## 2016-01-12 ENCOUNTER — Ambulatory Visit (INDEPENDENT_AMBULATORY_CARE_PROVIDER_SITE_OTHER): Payer: Medicare Other | Admitting: Urology

## 2016-01-12 DIAGNOSIS — R3915 Urgency of urination: Secondary | ICD-10-CM | POA: Diagnosis not present

## 2016-01-12 DIAGNOSIS — R351 Nocturia: Secondary | ICD-10-CM

## 2016-02-19 ENCOUNTER — Other Ambulatory Visit: Payer: Self-pay | Admitting: "Endocrinology

## 2016-02-20 LAB — COMPREHENSIVE METABOLIC PANEL
A/G RATIO: 2.3 — AB (ref 1.2–2.2)
ALBUMIN: 4.5 g/dL (ref 3.5–4.8)
ALT: 16 IU/L (ref 0–32)
AST: 17 IU/L (ref 0–40)
Alkaline Phosphatase: 84 IU/L (ref 39–117)
BUN / CREAT RATIO: 31 — AB (ref 12–28)
BUN: 40 mg/dL — ABNORMAL HIGH (ref 8–27)
Bilirubin Total: 0.3 mg/dL (ref 0.0–1.2)
CALCIUM: 9.4 mg/dL (ref 8.7–10.3)
CO2: 21 mmol/L (ref 18–29)
CREATININE: 1.29 mg/dL — AB (ref 0.57–1.00)
Chloride: 98 mmol/L (ref 96–106)
GFR calc non Af Amer: 40 mL/min/{1.73_m2} — ABNORMAL LOW (ref >59)
GFR, EST AFRICAN AMERICAN: 46 mL/min/{1.73_m2} — AB (ref >59)
GLOBULIN, TOTAL: 2 g/dL (ref 1.5–4.5)
Glucose: 167 mg/dL — ABNORMAL HIGH (ref 65–99)
Potassium: 5 mmol/L (ref 3.5–5.2)
SODIUM: 140 mmol/L (ref 134–144)
Total Protein: 6.5 g/dL (ref 6.0–8.5)

## 2016-02-20 LAB — TSH: TSH: 2.47 u[IU]/mL (ref 0.450–4.500)

## 2016-02-20 LAB — T4, FREE: Free T4: 1.36 ng/dL (ref 0.82–1.77)

## 2016-02-20 LAB — HGB A1C W/O EAG: Hgb A1c MFr Bld: 7 % — ABNORMAL HIGH (ref 4.8–5.6)

## 2016-02-22 ENCOUNTER — Ambulatory Visit: Payer: Medicare Other | Admitting: Nutrition

## 2016-02-23 ENCOUNTER — Ambulatory Visit (INDEPENDENT_AMBULATORY_CARE_PROVIDER_SITE_OTHER): Payer: Medicare Other | Admitting: Urology

## 2016-02-23 DIAGNOSIS — R3915 Urgency of urination: Secondary | ICD-10-CM | POA: Diagnosis not present

## 2016-02-23 DIAGNOSIS — R351 Nocturia: Secondary | ICD-10-CM | POA: Diagnosis not present

## 2016-02-26 ENCOUNTER — Encounter: Payer: Medicare Other | Attending: "Endocrinology | Admitting: Nutrition

## 2016-02-26 ENCOUNTER — Encounter: Payer: Self-pay | Admitting: "Endocrinology

## 2016-02-26 ENCOUNTER — Ambulatory Visit (INDEPENDENT_AMBULATORY_CARE_PROVIDER_SITE_OTHER): Payer: Medicare Other | Admitting: "Endocrinology

## 2016-02-26 VITALS — Wt 201.0 lb

## 2016-02-26 VITALS — BP 136/69 | HR 87 | Ht 63.0 in | Wt 201.0 lb

## 2016-02-26 DIAGNOSIS — Z794 Long term (current) use of insulin: Secondary | ICD-10-CM

## 2016-02-26 DIAGNOSIS — E1122 Type 2 diabetes mellitus with diabetic chronic kidney disease: Secondary | ICD-10-CM | POA: Diagnosis not present

## 2016-02-26 DIAGNOSIS — E782 Mixed hyperlipidemia: Secondary | ICD-10-CM | POA: Diagnosis not present

## 2016-02-26 DIAGNOSIS — E038 Other specified hypothyroidism: Secondary | ICD-10-CM | POA: Diagnosis not present

## 2016-02-26 DIAGNOSIS — Z713 Dietary counseling and surveillance: Secondary | ICD-10-CM | POA: Diagnosis present

## 2016-02-26 DIAGNOSIS — IMO0002 Reserved for concepts with insufficient information to code with codable children: Secondary | ICD-10-CM

## 2016-02-26 DIAGNOSIS — I10 Essential (primary) hypertension: Secondary | ICD-10-CM | POA: Diagnosis not present

## 2016-02-26 DIAGNOSIS — E118 Type 2 diabetes mellitus with unspecified complications: Secondary | ICD-10-CM

## 2016-02-26 DIAGNOSIS — N183 Chronic kidney disease, stage 3 (moderate): Secondary | ICD-10-CM

## 2016-02-26 DIAGNOSIS — E669 Obesity, unspecified: Secondary | ICD-10-CM

## 2016-02-26 DIAGNOSIS — E1165 Type 2 diabetes mellitus with hyperglycemia: Secondary | ICD-10-CM

## 2016-02-26 MED ORDER — INSULIN GLARGINE 100 UNIT/ML SOLOSTAR PEN: 30.0000 [IU] | PEN_INJECTOR | Freq: Every day | SUBCUTANEOUS | 2 refills | Status: DC

## 2016-02-26 NOTE — Progress Notes (Signed)
  Medical Nutrition Therapy:  Appt start time: 1330 end time:  1430.  Assessment:  Primary concerns today: Diabetes Type 2. A1C  7%. 7 day avg 178  14 day avg 184  30 avg 177 mg/dl Drinking water. Still have craving sweets. Gained 2 lbs but may be winter clothes. BS are better overall.  Hasn't exercised like she was wanting to . Saw Dr. Dorris Fetch today.  Meds:  Lantus 30 units,. CKD Stg 3. Drinking more water.  Diet is low in fresh fruits and whole grains and low carb veggies which is why she is hungry and craving sweets at times most likely.  Lab Results  Component Value Date   HGBA1C 7.0 (H) 02/19/2016   Lipid Panel     Component Value Date/Time   CHOL 146 08/04/2015   TRIG 199 (A) 08/04/2015   HDL 37 08/04/2015   LDLCALC 69 08/04/2015      Preferred Learning Style:    No preference indicated   Learning Readiness:   Ready  Change in progress   MEDICATIONS:   DIETARY INTAKE:     24-hr recall:  B ( AM):  Special K protein with milk, Snk ( AM): none  L ( PM):  [employer] bread and PB and small banana  Snk ( PM): none D ( PM):  Zuccini and pasta 1 cup, and cheese,  Beverages: water,   Usual physical activity:  ADL.  Estimated energy needs: 1200-1500 calories 135 g carbohydrates 90 g protein 33 g fat  Progress Towards Goal(s):  In progress.   Nutritional Diagnosis:  NB-1.1 Food and nutrition-related knowledge deficit As related to Diabetes.  As evidenced by 8.4%..    Intervention: Nutrition and Diabetes education provided on My Plate, CHO counting, meal planning, portion sizes, timing of meals, avoiding snacks between meals unless having a low blood sugar, target ranges for A1C and blood sugars, signs/symptoms and treatment of hyper/hypoglycemia, monitoring blood sugars, taking medications as prescribed, benefits of exercising 30 minutes per day and prevention of complications of DM.   Goals 1. Add fruit with meals  2. Increase low carb vegetables with meals 3, Keep  drinking water 4. Increase Lantus to 30 units per day   Teaching Method Utilized: Visual Auditory Hands on  Handouts given during visit include:  The Plate Method  Meal Plan Card   Barriers to learning/adherence to lifestyle change: none  Demonstrated degree of understanding via:  Teach Back   Monitoring/Evaluation:  Dietary intake, exercise, meal planning, SBG, and body weight in 3 month(s).

## 2016-02-26 NOTE — Patient Instructions (Addendum)
Goals 1. Add fruit with meals  2. Increase low carb vegetables with meals 3, Keep drinking water 4. Increase Lantus to 30 units per day

## 2016-02-26 NOTE — Patient Instructions (Signed)

## 2016-02-26 NOTE — Progress Notes (Signed)
Subjective:    Patient ID: Debra Hopkins, female    DOB: January 23, 1941. Patient is being seen in f/u for management of diabetes requested by  [facility], MD  Past Medical History:  Diagnosis Date  . Arthritis   . CAD (coronary artery disease)   . Carotid artery occlusion   . Cataracts, bilateral   . DDD (degenerative disc disease)   . Diabetes mellitus    Type 2  . Diverticulitis   . Dyspnea   . Fibromyalgia   . GERD (gastroesophageal reflux disease)   . Hemorrhoids   . History of shingles   . Impaired hearing   . Macular degeneration   . Other and unspecified hyperlipidemia   . PVD (peripheral vascular disease) ([place])   . TIA (transient ischemic attack)   . Unspecified essential hypertension   . Varicose veins    Past Surgical History:  Procedure Laterality Date  . ANGIOPLASTY     left leg 2003  . APPENDECTOMY    . aspiration of a cyst in the right breast    . BALLOON DILATION N/A 10/01/2013   Procedure: BALLOON DILATION;  Surgeon: Rogene Houston, MD;  [facility];  Service: Endoscopy;  Laterality: N/A;  . BREAST BIOPSY    . BREAST BIOPSY Left   . CARDIAC CATHETERIZATION    . CAROTID ENDARTERECTOMY  12-03-06   left CEA  . CATARACT EXTRACTION W/PHACO Right 10/14/2013   Procedure: CATARACT EXTRACTION PHACO AND INTRAOCULAR LENS PLACEMENT (IOC);  Surgeon: Tonny Branch, MD;  Location: [facility];  Service: Ophthalmology;  Laterality: Right;  CDE 7.20  . CATARACT EXTRACTION W/PHACO Left 11/04/2013   Procedure: CATARACT EXTRACTION PHACO AND INTRAOCULAR LENS PLACEMENT (IOC);  Surgeon: Tonny Branch, MD;  Location: [facility];  Service: Ophthalmology;  Laterality: Left;  CDE 8.57  . COLONOSCOPY N/A 09/19/2014   Procedure: COLONOSCOPY;  Surgeon: Rogene Houston, MD;  [facility];  Service: Endoscopy;  Laterality: N/A;  7:30-8:30  . ESOPHAGOGASTRODUODENOSCOPY N/A 10/01/2013   Procedure: ESOPHAGOGASTRODUODENOSCOPY (EGD);  Surgeon: Rogene Houston, MD;  [facility];   Service: Endoscopy;  Laterality: N/A;  120  . ESOPHAGOGASTRODUODENOSCOPY N/A 10/24/2014   Procedure: ESOPHAGOGASTRODUODENOSCOPY (EGD);  Surgeon: Rogene Houston, MD;  [facility];  Service: Endoscopy;  Laterality: N/A;  730  . GIVENS CAPSULE STUDY N/A 10/24/2014   Procedure: GIVENS CAPSULE STUDY;  Surgeon: Rogene Houston, MD;  [facility];  Service: Endoscopy;  Laterality: N/A;  . JOINT REPLACEMENT  2011   Right total knee replacement  . [place]  . KNEE ARTHROSCOPY  2009  . MALONEY DILATION N/A 10/01/2013   Procedure: Venia Minks DILATION;  Surgeon: Rogene Houston, MD;  [facility];  Service: Endoscopy;  Laterality: N/A;  . SAVORY DILATION N/A 10/01/2013   Procedure: SAVORY DILATION;  Surgeon: Rogene Houston, MD;  [facility];  Service: Endoscopy;  Laterality: N/A;  . SPINE SURGERY  1997   C5-c6 fusion  . TOTAL ABDOMINAL HYSTERECTOMY  1973   Social History   Social History  . Marital status: Widowed    Spouse name: N/A  . Number of children: N/A  . Years of education: N/A   Social History Main Topics  . Smoking status: Former Smoker    Types: Cigarettes    Quit date: 03/11/1985  . Smokeless tobacco: Never Used  . Alcohol use No  . Drug use: No  . Sexual activity:  Not Asked   Other Topics Concern  . None   Social History Narrative   Retired. Married.    Outpatient Encounter Prescriptions as of 02/26/2016  Medication Sig  . Alum Hydroxide-Mag Trisilicate (GAVISCON) [id number] MG CHEW Chew 2-3 tablets by mouth daily as needed (acid reflux).   Marland Kitchen aspirin (ECOTRIN LOW STRENGTH) 81 MG EC tablet Take 81 mg by mouth daily. Swallow whole.  . B Complex-Biotin-FA (SUPER B-COMPLEX PO) Take 1 tablet by mouth daily.   . Calcium Citrate (CALCITRATE PO) Take 1 tablet by mouth daily.   . cephALEXin (KEFLEX) 500 MG capsule Take 1 capsule (500 mg total) by mouth 4 (four) times daily.  . Cholecalciferol (VITAMIN D) 2000 units tablet  Take 2,000 Units by mouth daily.  . cloNIDine (CATAPRES) 0.1 MG tablet Take 0.1 mg by mouth 3 (three) times daily.  Marland Kitchen esomeprazole (NEXIUM) 40 MG capsule Take 40 mg by mouth daily before breakfast.  . hydrALAZINE (APRESOLINE) 25 MG tablet Take 25 mg by mouth daily.  . Insulin Glargine (LANTUS SOLOSTAR) 100 UNIT/ML Solostar Pen Inject 30 Units into the skin daily at 10 pm.  . Insulin Pen Needle (B-D ULTRAFINE III SHORT PEN) 31G X 8 MM MISC 1 each by Does not apply route as directed.  Marland Kitchen levothyroxine (SYNTHROID, LEVOTHROID) 50 MCG tablet Take 50 mcg by mouth daily before breakfast.  . losartan (COZAAR) 100 MG tablet Take 100 mg by mouth daily.    . Magnesium 250 MG TABS Take 1 tablet by mouth daily.   . methylcellulose (CITRUCEL) oral powder Take 3 packets by mouth 2 (two) times a week. 3 TBSP Twice Weekly  . Misc Natural Products (OSTEO BI-FLEX ADV JOINT SHIELD PO) Take 1 tablet by mouth every other day.   . Multiple Vitamin (MULTIVITAMIN) tablet Take 1 tablet by mouth daily.  . Multiple Vitamins-Minerals (PRESERVISION AREDS 2 PO) Take 2 tablets by mouth daily.  Marland Kitchen NIFEdipine (PROCARDIA-XL/ADALAT-CC/NIFEDICAL-XL) 30 MG 24 hr tablet Take 30 mg by mouth daily.  Marland Kitchen omega-3 acid ethyl esters (LOVAZA) 1 g capsule Take 1 g by mouth daily.   Marland Kitchen oxyCODONE-acetaminophen (PERCOCET/ROXICET) 5-325 MG per tablet Take 0.5 tablets by mouth every 4 (four) hours as needed for moderate pain or severe pain.   . simvastatin (ZOCOR) 20 MG tablet Take 10 mg by mouth at bedtime.   . torsemide (DEMADEX) 20 MG tablet Take 10-20 mg by mouth daily.   . traZODone (DESYREL) 50 MG tablet Take 100 mg by mouth at bedtime.   . [DISCONTINUED] Insulin Glargine (LANTUS SOLOSTAR) 100 UNIT/ML Solostar Pen Inject 20 Units into the skin daily at 10 pm.   No facility-administered encounter medications on file as of 02/26/2016.    ALLERGIES: Allergies  Allergen Reactions  . Amoxicillin-Pot Clavulanate     REACTION: hives  .  Amoxicillin-Pot Clavulanate Hives  . Ciprofloxacin Nausea Only    Dizziness  . Erythromycin     REACTION: Upset stomach  . Motrin [Ibuprofen]     REACTION: nausea  . Naproxen     REACTION: severe nausea  . Prednisone     REACTION: itchy rash  . Sulfamethoxazole-Trimethoprim     REACTION: Upset stomach   VACCINATION STATUS:  There is no immunization history on file for this patient.  Diabetes  She presents for her follow-up diabetic visit. She has type 2 diabetes mellitus. Onset time: She was diagnosed at approximate age of 38 years. Her disease course has been improving. There are no hypoglycemic associated symptoms. Pertinent  negatives for hypoglycemia include no confusion, headaches, pallor or seizures. Associated symptoms include fatigue. Pertinent negatives for diabetes include no chest pain, no polydipsia, no polyphagia and no polyuria. There are no hypoglycemic complications. Symptoms are improving. Diabetic complications include nephropathy. Risk factors for coronary artery disease include dyslipidemia, diabetes mellitus, hypertension, obesity, sedentary lifestyle and tobacco exposure (She smoked for 28 years before she quit.). Current diabetic treatment includes oral agent (monotherapy). Her weight is stable. She is following a generally unhealthy diet. When asked about meal planning, she reported none. She has not had a previous visit with a dietitian. She rarely participates in exercise. Her home blood glucose trend is decreasing steadily. Her breakfast blood glucose range is generally 140-180 mg/dl. Her overall blood glucose range is 140-180 mg/dl. An ACE inhibitor/angiotensin II receptor blocker is being taken. Eye exam is current.  Hyperlipidemia  This is a chronic problem. The current episode started more than 1 year ago. The problem is controlled. Recent lipid tests were reviewed and are variable. Exacerbating diseases include diabetes, hypothyroidism and obesity. Pertinent  negatives include no chest pain, myalgias or shortness of breath. Current antihyperlipidemic treatment includes statins. Risk factors for coronary artery disease include diabetes mellitus, dyslipidemia, hypertension, family history, obesity and a sedentary lifestyle.  Hypertension  This is a chronic problem. The current episode started more than 1 year ago. The problem is controlled. Pertinent negatives include no chest pain, headaches, palpitations or shortness of breath. Risk factors for coronary artery disease include diabetes mellitus, dyslipidemia, obesity, sedentary lifestyle and smoking/tobacco exposure. Past treatments include angiotensin blockers. Hypertensive end-organ damage includes kidney disease.    Review of Systems  Constitutional: Positive for fatigue. Negative for unexpected weight change.  HENT: Negative for trouble swallowing and voice change.   Eyes: Negative for visual disturbance.  Respiratory: Negative for cough, shortness of breath and wheezing.   Cardiovascular: Negative for chest pain, palpitations and leg swelling.  Gastrointestinal: Negative for diarrhea, nausea and vomiting.  Endocrine: Negative for cold intolerance, heat intolerance, polydipsia, polyphagia and polyuria.  Genitourinary: Positive for frequency. Negative for dysuria and flank pain.  Musculoskeletal: Negative for arthralgias and myalgias.  Skin: Negative for color change, pallor, rash and wound.  Neurological: Negative for seizures and headaches.  Psychiatric/Behavioral: Negative for confusion and suicidal ideas.    Objective:    BP 136/69   Pulse 87   Ht 5\' 3"  (1.6 m)   Wt 201 lb (91.2 kg)   BMI 35.61 kg/m   Wt Readings from Last 3 Encounters:  02/26/16 201 lb (91.2 kg)  11/23/15 199 lb (90.3 kg)  11/20/15 199 lb 6.4 oz (90.4 kg)    Physical Exam  Constitutional: She is oriented to person, place, and time. She appears well-developed.  HENT:  Head: Normocephalic and atraumatic.  Eyes:  EOM are normal.  Neck: Normal range of motion. Neck supple. No tracheal deviation present. No thyromegaly present.  Cardiovascular: Normal rate and regular rhythm.   Pulmonary/Chest: Effort normal and breath sounds normal.  Abdominal: Soft. Bowel sounds are normal. There is no tenderness. There is no guarding.  Musculoskeletal: Normal range of motion. She exhibits no edema.  Neurological: She is alert and oriented to person, place, and time. She has normal reflexes. No cranial nerve deficit. Coordination normal.  Skin: Skin is warm and dry. No rash noted. No erythema. No pallor.  Psychiatric: She has a normal mood and affect. Judgment normal.    CMP     Component Value Date/Time   NA  140 02/19/2016 1448   K 5.0 02/19/2016 1448   CL 98 02/19/2016 1448   CO2 21 02/19/2016 1448   GLUCOSE 167 (H) 02/19/2016 1448   GLUCOSE 115 (H) 11/17/2015 1228   BUN 40 (H) 02/19/2016 1448   CREATININE 1.29 (H) 02/19/2016 1448   CALCIUM 9.4 02/19/2016 1448   PROT 6.5 02/19/2016 1448   ALBUMIN 4.5 02/19/2016 1448   AST 17 02/19/2016 1448   ALT 16 02/19/2016 1448   ALKPHOS 84 02/19/2016 1448   BILITOT 0.3 02/19/2016 1448   GFRNONAA 40 (L) 02/19/2016 1448   GFRAA 46 (L) 02/19/2016 1448   Recent Results (from the past 2160 hour(s))  Comprehensive metabolic panel     Status: Abnormal   Collection Time: 02/19/16  2:48 PM  Result Value Ref Range   Glucose 167 (H) 65 - 99 mg/dL   BUN 40 (H) 8 - 27 mg/dL   Creatinine, Ser 1.29 (H) 0.57 - 1.00 mg/dL   GFR calc non Af Amer 40 (L) >59 mL/min/1.73   GFR calc Af Amer 46 (L) >59 mL/min/1.73   BUN/Creatinine Ratio 31 (H) 12 - 28   Sodium 140 134 - 144 mmol/L   Potassium 5.0 3.5 - 5.2 mmol/L   Chloride 98 96 - 106 mmol/L   CO2 21 18 - 29 mmol/L   Calcium 9.4 8.7 - 10.3 mg/dL   Total Protein 6.5 6.0 - 8.5 g/dL   Albumin 4.5 3.5 - 4.8 g/dL   Globulin, Total 2.0 1.5 - 4.5 g/dL   Albumin/Globulin Ratio 2.3 (H) 1.2 - 2.2   Bilirubin Total 0.3 0.0 - 1.2  mg/dL   Alkaline Phosphatase 84 39 - 117 IU/L   AST 17 0 - 40 IU/L   ALT 16 0 - 32 IU/L  Hgb A1c w/o eAG     Status: Abnormal   Collection Time: 02/19/16  2:48 PM  Result Value Ref Range   Hgb A1c MFr Bld 7.0 (H) 4.8 - 5.6 %    Comment:          Pre-diabetes: 5.7 - 6.4          Diabetes: >6.4          Glycemic control for adults with diabetes: <7.0   T4, free     Status: None   Collection Time: 02/19/16  2:48 PM  Result Value Ref Range   Free T4 1.36 0.82 - 1.77 ng/dL  TSH     Status: None   Collection Time: 02/19/16  2:48 PM  Result Value Ref Range   TSH 2.470 0.450 - 4.500 uIU/mL      Assessment & Plan:   1. Type 2 diabetes mellitus with stage 3 chronic kidney disease, without long-term current use of insulin (Debra Hopkins)  - Patient has currently uncontrolled symptomatic type 2 DM since  75 years of age,  with most recent A1c  7%, over all improving from  8.4 %. Recent labs reviewed.   Her diabetes is complicated by chronic kidney disease stage 3 and patient remains at a high risk for more acute and chronic complications of diabetes which include CAD, CVA, CKD, retinopathy, and neuropathy. These are all discussed in detail with the patient.  - I have counseled the patient on diet management and weight loss, by adopting a carbohydrate restricted/protein rich diet.  - Suggestion is made for patient to avoid simple carbohydrates   from their diet including Cakes , Desserts, Ice Cream,  Soda (  diet and regular) ,  Sweet Tea , Candies,  Chips, Cookies, Artificial Sweeteners,   and "Sugar-free" Products . This will help patient to have stable blood glucose profile and potentially avoid unintended weight gain.  - I encouraged the patient to switch to  unprocessed or minimally processed complex starch and increased protein intake (animal or plant source), fruits, and vegetables.  - Patient is advised to stick to a routine mealtimes to eat 3 meals  a day and avoid unnecessary snacks ( to  snack only to correct hypoglycemia).  - The patient will be scheduled with Jearld Fenton, RDN, CDE for individualized DM education.  - I have approached patient with the following individualized plan to manage diabetes and patient agrees:   - She will  continue to need basal  insulin treatment given the fact that she does not have good oral option due to CKD. - she became comfortable using insulin.  -I will increase Lantus to 30 units daily at bedtime  associated with monitoring of blood glucose twice a day before breakfast and at bedtime.  - Her GFR is  low at 40 , she he is not a candidate for metformin nor  for SGLT2 I, . - She will be  be considered for incretin therapy as appropriate next visit. - Patient specific target  A1c;  LDL, HDL, Triglycerides, and  Waist Circumference were discussed in detail.  2) BP/HTN: controlled. Continue current medications including ACEI/ARB. 3) Lipids/HPL: controlled, continue statins. 4)  Weight/Diet: CDE Consult will be initiated , exercise, and detailed carbohydrates information provided.  5) Chronic Care/Health Maintenance:  -Patient is on ACEI/ARB and Statin medications and encouraged to continue to follow up with Ophthalmology, Podiatrist at least yearly or according to recommendations, and advised to   stay away from smoking. I have recommended yearly flu vaccine and pneumonia vaccination at least every 5 years; moderate intensity exercise for up to 150 minutes weekly; and  sleep for at least 7 hours a day.  - 30 minutes of time was spent on the care of this patient , 50% of which was applied for counseling on diabetes complications and their preventions.  - Patient to bring meter and  blood glucose logs during their next visit.   - I advised patient to maintain close follow up with [facility], MD for primary care needs.  Follow up plan: - Return in about 3 months (around 05/26/2016) for follow up with pre-visit labs, meter, and  logs.  Debra Lloyd, MD Phone: [phone]  Fax: [phone]   02/26/2016, 2:00 PM

## 2016-03-06 ENCOUNTER — Other Ambulatory Visit: Payer: Self-pay

## 2016-03-06 MED ORDER — INSULIN GLARGINE 100 UNIT/ML SOLOSTAR PEN: 30.0000 [IU] | PEN_INJECTOR | Freq: Every day | SUBCUTANEOUS | 2 refills | Status: DC

## 2016-03-13 ENCOUNTER — Telehealth: Payer: Self-pay | Admitting: Radiology

## 2016-03-13 ENCOUNTER — Other Ambulatory Visit: Payer: Self-pay | Admitting: Radiology

## 2016-03-13 DIAGNOSIS — R3915 Urgency of urination: Secondary | ICD-10-CM

## 2016-03-13 DIAGNOSIS — R3989 Other symptoms and signs involving the genitourinary system: Secondary | ICD-10-CM

## 2016-03-13 NOTE — Telephone Encounter (Signed)
LMOM. Need to discuss upcoming surgery with Dr Jeffie Pollock at [facility].

## 2016-03-14 NOTE — Telephone Encounter (Signed)
Notified pt of surgery at [facility] with Dr Jeffie Pollock on 03/29/16, pre-op appt at [place] [facility] on 03/27/16 @1 :94 & that [place] office will call pt with follow up appt information. Pt voices understanding.

## 2016-03-25 NOTE — Patient Instructions (Signed)
Debra Hopkins  03/25/2016     @PREFPERIOPPHARMACY @   Your procedure is scheduled on 03/29/2016.  Report to [facility] at 10:30 A.M.  Call this number if you have problems the morning of surgery:  [phone]   Remember:  Do not eat food or drink liquids after midnight.  Take these medicines the morning of surgery with A SIP OF WATER Oxycodone if needed, Procardia, Cozaar, Synthroid, Keflex, Hydralizine, Allegra  Nexium, Catapress   Do not wear jewelry, make-up or nail polish.  Do not wear lotions, powders, or perfumes, or deoderant.  Do not shave 48 hours prior to surgery.  Men may shave face and neck.  Do not bring valuables to the hospital.  [facility] is not responsible for any belongings or valuables.  Contacts, dentures or bridgework may not be worn into surgery.  Leave your suitcase in the car.  After surgery it may be brought to your room.  For patients admitted to the hospital, discharge time will be determined by your treatment team.  Patients discharged the day of surgery will not be allowed to drive home.    Please read over the following fact sheets that you were given. Surgical Site Infection Prevention and Anesthesia Post-op Instructions     PATIENT INSTRUCTIONS POST-ANESTHESIA  IMMEDIATELY FOLLOWING SURGERY:  Do not drive or operate machinery for the first twenty four hours after surgery.  Do not make any important decisions for twenty four hours after surgery or while taking narcotic pain medications or sedatives.  If you develop intractable nausea and vomiting or a severe headache please notify your doctor immediately.  FOLLOW-UP:  Please make an appointment with your surgeon as instructed. You do not need to follow up with anesthesia unless specifically instructed to do so.  WOUND CARE INSTRUCTIONS (if applicable):  Keep a dry clean dressing on the anesthesia/puncture wound site if there is drainage.  Once the wound has quit draining you may leave it open  to air.  Generally you should leave the bandage intact for twenty four hours unless there is drainage.  If the epidural site drains for more than 36-48 hours please call the anesthesia department.  QUESTIONS?:  Please feel free to call your physician or the hospital operator if you have any questions, and they will be happy to assist you.      Cystoscopy Cystoscopy is a procedure that is used to help diagnose and sometimes treat conditions that affect that lower urinary tract. The lower urinary tract includes the bladder and the tube that drains urine from the bladder out of the body (urethra). Cystoscopy is performed with a thin, tube-shaped instrument with a light and camera at the end (cystoscope). The cystoscope may be hard (rigid) or flexible, depending on the goal of the procedure.The cystoscope is inserted through the urethra, into the bladder. Cystoscopy may be recommended if you have:  Urinary tractinfections that keep coming back (recurring).  Blood in the urine (hematuria).  Loss of bladder control (urinary incontinence) or an overactive bladder.  Unusual cells found in a urine sample.  A blockage in the urethra.  Painful urination.  An abnormality in the bladder found during an intravenous pyelogram (IVP) or CT scan. Cystoscopy may also be done to remove a sample of tissue to be examined under a microscope (biopsy). Tell a health care provider about:  Any allergies you have.  All medicines you are taking, including vitamins, herbs, eye drops, creams, and over-the-counter medicines.  Any problems  you or family members have had with anesthetic medicines.  Any blood disorders you have.  Any surgeries you have had.  Any medical conditions you have.  Whether you are pregnant or may be pregnant. What are the risks? Generally, this is a safe procedure. However, problems may occur, including:  Infection.  Bleeding.  Allergic reactions to medicines.  Damage to other  structures or organs. What happens before the procedure?  Ask your health care provider about:  Changing or stopping your regular medicines. This is especially important if you are taking diabetes medicines or blood thinners.  Taking medicines such as aspirin and ibuprofen. These medicines can thin your blood. Do not take these medicines before your procedure if your health care provider instructs you not to.  Follow instructions from your health care provider about eating or drinking restrictions.  You may be given antibiotic medicine to help prevent infection.  You may have an exam or testing, such as X-rays of the bladder, urethra, or kidneys.  You may have urine tests to check for signs of infection.  Plan to have someone take you home after the procedure. What happens during the procedure?  To reduce your risk of infection,your health care team will wash or sanitize their hands.  You will be given one or more of the following:  A medicine to help you relax (sedative).  A medicine to numb the area (local anesthetic).  The area around the opening of your urethra will be cleaned.  The cystoscope will be passed through your urethra into your bladder.  Germ-free (sterile)fluid will flow through the cystoscope to fill your bladder. The fluid will stretch your bladder so that your surgeon can clearly examine your bladder walls.  The cystoscope will be removed and your bladder will be emptied. The procedure may vary among health care providers and hospitals. What happens after the procedure?  You may have some soreness or pain in your abdomen and urethra. Medicines will be available to help you.  You may have some blood in your urine.  Do not drive for 24 hours if you received a sedative. This information is not intended to replace advice given to you by your health care provider. Make sure you discuss any questions you have with your health care provider. Document  Released: 02/23/2000 Document Revised: 07/06/2015 Document Reviewed: 01/12/2015 Elsevier Interactive Patient Education  2017 [employer].

## 2016-03-27 ENCOUNTER — Encounter (HOSPITAL_COMMUNITY)
Admission: RE | Admit: 2016-03-27 | Discharge: 2016-03-27 | Disposition: A | Discharge status: Home / Self Care | Payer: Medicare Other | Source: Ambulatory Visit | Attending: Urology | Admitting: Urology

## 2016-03-28 ENCOUNTER — Encounter: Payer: Self-pay | Admitting: Family

## 2016-03-28 NOTE — H&P (Signed)
I have an overactive bladder.  HPI: Debra Hopkins is a 76 year-old female established patient who is here for evaluation of overactive bladder.  She first stated noticing pain on 09/15/2015. She does have urgency. She does have problems getting to the bathroom in time after she has the urge to urinate. She does not urinate more frequently than once every 4 hours in the daytime. She is not on any medications forher over active bladder symptoms.   She does not wear protective pads. She gets up at night to urinate 4 times. She is not having problems with emptying her bladder well.   02/23/16: She was started on Vesicare at her last visit but couldn't tolerate the GI side effects. She had no change in her voiding habits with the vesicare. She was started on Jardiance in July and within a couple of days she had symptoms of a UTI and vaginal infection. She was seen by her PCP and GYN and nothing was found. She was seen in the ER in September and was given keflex which helped but she still has frequency and urgency with nocturia 4x. She has rare incontinence. She only had the one dose of Jardiance and is on insulin.  She has been tried on [employer] and Estrace which didn't help. The vaginal itching is rare. She has used Azo which has helped. He had a history of urethral dilations dating back to her 31's.    01/12/2016: No exacerbating/alleviating events. She has associated dysuria.       ALLERGIES: Augmentin Ciprofloxacin emycin Erythromycin Motrin Naprosyn Prednisone Septra DS TABS VESIcare TABS - Pt. states that she medication gave her abdominal pain and constipation when she tried samples during her last visit.    MEDICATIONS: Levothyroxine Sodium 50 mcg tablet  Nexium 40 mg capsule,delayed release  Simvastatin 10 mg tablet  Aspir 81  Citrucel  Clonidine 0.1 mg/24 hour patch, transdermal weekly  Hydralazine Hcl 25 mg tablet  Losartan Potassium 100 mg tablet  Magnesium 250 mg tablet   Nifedipine Er 30 mg tablet, extended release  Omega 3  One Daily Womens 50 Plus  Osteo Bi-Flex  Oxycodone-Acetaminophen 5 mg-325 mg tablet  Preservision Areds 2  Super B Complex  Torsemide 20 mg tablet  Trazodone Hcl 100 mg tablet  Vitamin D3 2,000 unit capsule     GU PSH: Hysterectomy      PSH Notes: lithotripsy   NON-GU PSH: Anesth, Catheterize Heart Anesth, Dx Knee Arthroscopy Appendectomy Breast Biopsy Heart Surgery (Unspecified) Inguinal Hernia Repair > 5 yrs Lumbar Laminectomy Total Knee Replacement    GU PMH: Nocturia - 01/12/2016 Urinary Urgency - 01/12/2016 Kidney Stone    NON-GU PMH: Arthritis Cardiac murmur, unspecified Diabetes Type 2 Fibromyalgia GERD Gout Hypercholesterolemia Hypertension Hypothyroidism Stroke/TIA    FAMILY HISTORY: Cancer - Father, Brother Diabetes - Father, Sister Hypercholesterolemia - Brother, Sister Hypertension - Mother, Father   SOCIAL HISTORY: Marital Status: Unknown Current Smoking Status: Patient does not smoke anymore. Has not smoked since 01/09/1986. Smoked for 27 years. Smoked 1 pack per day.  Has never drank.  Does not drink caffeine.    REVIEW OF SYSTEMS:    GU Review Female:   Patient reports frequent urination, burning /pain with urination, get up at night to urinate, and stream starts and stops. Patient denies hard to postpone urination, leakage of urine, trouble starting your stream, have to strain to urinate, and currently pregnant.  Gastrointestinal (Upper):   Patient denies nausea, vomiting, and indigestion/ heartburn.  Gastrointestinal (Lower):   Patient denies diarrhea and constipation.  Constitutional:   Patient reports fatigue. Patient denies fever, night sweats, and weight loss.  Skin:   Patient denies skin rash/ lesion and itching.  Eyes:   Patient reports blurred vision. Patient denies double vision.  Ears/ Nose/ Throat:   Patient denies sore throat and sinus problems.  Hematologic/Lymphatic:    Patient reports easy bruising. Patient denies swollen glands.  Cardiovascular:   Patient denies leg swelling and chest pains.  Respiratory:   Patient denies shortness of breath and cough.  Endocrine:   Patient denies excessive thirst.  Musculoskeletal:   Patient reports back pain and joint pain.   Neurological:   Patient denies headaches and dizziness.  Psychologic:   Patient denies depression and anxiety.   VITAL SIGNS:      02/23/2016 01:48 PM  BP 118/65 mmHg  Pulse 67 /min  Temperature 98.4 F / 37 C   MULTI-SYSTEM PHYSICAL EXAMINATION:    Constitutional: Well-nourished. No physical deformities. Normally developed. Good grooming.  Respiratory: No labored breathing, no use of accessory muscles. CTA  Cardiovascular: Normal temperature, rrr without murmur.Marland Kitchen      PAST DATA REVIEWED:  Source Of History:  Patient  Urine Test Review:   Urinalysis   PROCEDURES:          Urinalysis - 81003 Dipstick Dipstick Cont'd  Specimen: Voided Bilirubin: Neg  Color: Yellow Ketones: Neg  Appearance: Clear Blood: Neg  Specific Gravity: 1.010 Protein: Neg  pH: 5.0 Urobilinogen: 0.2  Glucose: Neg Nitrites: Neg    Leukocyte Esterase: Neg    ASSESSMENT:      ICD-10 Details  1 GU:   Urinary Urgency - R39.15 She has persistent symptoms and may have IC.   2   Nocturia - R35.1    PLAN:           Schedule Return Visit: ASAP - Schedule Surgery          Document Letter(s):  Created for Patient: Clinical Summary         Notes:   She has frequency, urgency and nocturia that didn't respond to medical therapy and she may have IC.   I am going to get her set up for cystoscopy with HOD under anesthesia and have reviewed the risks of bleeding, infection, bladder injury, retention, thrombotic events and anesthetic complications.

## 2016-03-29 ENCOUNTER — Encounter (HOSPITAL_COMMUNITY)
Admission: RE | Disposition: A | Discharge status: Home / Self Care | Payer: Self-pay | Source: Ambulatory Visit | Attending: Urology

## 2016-03-29 ENCOUNTER — Ambulatory Visit (HOSPITAL_COMMUNITY): Payer: Medicare Other | Admitting: Anesthesiology

## 2016-03-29 ENCOUNTER — Ambulatory Visit (HOSPITAL_COMMUNITY)
Admission: RE | Admit: 2016-03-29 | Discharge: 2016-03-29 | Disposition: A | Discharge status: Home / Self Care | Payer: Medicare Other | Source: Ambulatory Visit | Attending: Urology | Admitting: Urology

## 2016-03-29 ENCOUNTER — Encounter (HOSPITAL_COMMUNITY): Payer: Self-pay | Admitting: *Deleted

## 2016-03-29 DIAGNOSIS — R3982 Chronic bladder pain: Secondary | ICD-10-CM | POA: Diagnosis not present

## 2016-03-29 DIAGNOSIS — N816 Rectocele: Secondary | ICD-10-CM | POA: Insufficient documentation

## 2016-03-29 DIAGNOSIS — E7800 Pure hypercholesterolemia, unspecified: Secondary | ICD-10-CM | POA: Diagnosis not present

## 2016-03-29 DIAGNOSIS — R3915 Urgency of urination: Secondary | ICD-10-CM | POA: Diagnosis not present

## 2016-03-29 DIAGNOSIS — R3989 Other symptoms and signs involving the genitourinary system: Secondary | ICD-10-CM

## 2016-03-29 DIAGNOSIS — Z87891 Personal history of nicotine dependence: Secondary | ICD-10-CM | POA: Diagnosis not present

## 2016-03-29 DIAGNOSIS — Z8673 Personal history of transient ischemic attack (TIA), and cerebral infarction without residual deficits: Secondary | ICD-10-CM | POA: Diagnosis not present

## 2016-03-29 DIAGNOSIS — N8110 Cystocele, unspecified: Secondary | ICD-10-CM | POA: Diagnosis not present

## 2016-03-29 DIAGNOSIS — Z79891 Long term (current) use of opiate analgesic: Secondary | ICD-10-CM | POA: Insufficient documentation

## 2016-03-29 DIAGNOSIS — Z7982 Long term (current) use of aspirin: Secondary | ICD-10-CM | POA: Insufficient documentation

## 2016-03-29 DIAGNOSIS — N952 Postmenopausal atrophic vaginitis: Secondary | ICD-10-CM | POA: Diagnosis not present

## 2016-03-29 DIAGNOSIS — Z79899 Other long term (current) drug therapy: Secondary | ICD-10-CM | POA: Insufficient documentation

## 2016-03-29 DIAGNOSIS — Z96659 Presence of unspecified artificial knee joint: Secondary | ICD-10-CM | POA: Diagnosis not present

## 2016-03-29 DIAGNOSIS — K219 Gastro-esophageal reflux disease without esophagitis: Secondary | ICD-10-CM | POA: Diagnosis not present

## 2016-03-29 DIAGNOSIS — I10 Essential (primary) hypertension: Secondary | ICD-10-CM | POA: Diagnosis not present

## 2016-03-29 DIAGNOSIS — E039 Hypothyroidism, unspecified: Secondary | ICD-10-CM | POA: Diagnosis not present

## 2016-03-29 DIAGNOSIS — N3281 Overactive bladder: Secondary | ICD-10-CM | POA: Insufficient documentation

## 2016-03-29 DIAGNOSIS — E1151 Type 2 diabetes mellitus with diabetic peripheral angiopathy without gangrene: Secondary | ICD-10-CM | POA: Insufficient documentation

## 2016-03-29 DIAGNOSIS — Z7984 Long term (current) use of oral hypoglycemic drugs: Secondary | ICD-10-CM | POA: Diagnosis not present

## 2016-03-29 DIAGNOSIS — R351 Nocturia: Secondary | ICD-10-CM | POA: Diagnosis not present

## 2016-03-29 HISTORY — PX: CYSTO WITH HYDRODISTENSION: SHX5453

## 2016-03-29 HISTORY — PX: CYSTOSCOPY WITH INJECTION: SHX1424

## 2016-03-29 LAB — CBC
HEMATOCRIT: 31.6 % — AB (ref 36.0–46.0)
Hemoglobin: 10.9 g/dL — ABNORMAL LOW (ref 12.0–15.0)
MCH: 31.8 pg (ref 26.0–34.0)
MCHC: 34.5 g/dL (ref 30.0–36.0)
MCV: 92.1 fL (ref 78.0–100.0)
PLATELETS: 585 10*3/uL — AB (ref 150–400)
RBC: 3.43 MIL/uL — AB (ref 3.87–5.11)
RDW: 17.1 % — ABNORMAL HIGH (ref 11.5–15.5)
WBC: 6.4 10*3/uL (ref 4.0–10.5)

## 2016-03-29 LAB — BASIC METABOLIC PANEL
Anion gap: 13 (ref 5–15)
BUN: 36 mg/dL — AB (ref 6–20)
CHLORIDE: 102 mmol/L (ref 101–111)
CO2: 21 mmol/L — AB (ref 22–32)
CREATININE: 1.14 mg/dL — AB (ref 0.44–1.00)
Calcium: 9.5 mg/dL (ref 8.9–10.3)
GFR calc Af Amer: 53 mL/min — ABNORMAL LOW (ref >60)
GFR calc non Af Amer: 46 mL/min — ABNORMAL LOW (ref >60)
Glucose, Bld: 159 mg/dL — ABNORMAL HIGH (ref 65–99)
POTASSIUM: 4.4 mmol/L (ref 3.5–5.1)
Sodium: 136 mmol/L (ref 135–145)

## 2016-03-29 LAB — GLUCOSE, CAPILLARY: Glucose-Capillary: 151 mg/dL — ABNORMAL HIGH (ref 65–99)

## 2016-03-29 SURGERY — CYSTOSCOPY, WITH BLADDER HYDRODISTENSION
Anesthesia: General | Site: Bladder

## 2016-03-29 MED ORDER — SODIUM CHLORIDE 0.9 % IV SOLN: 250.0000 mL | INTRAVENOUS | Status: DC | PRN

## 2016-03-29 MED ORDER — MIDAZOLAM HCL 2 MG/2ML IJ SOLN
INTRAMUSCULAR | Status: AC
  Filled 2016-03-29: qty 2

## 2016-03-29 MED ORDER — PROPOFOL 10 MG/ML IV BOLUS
INTRAVENOUS | Status: DC | PRN
  Administered 2016-03-29 (×2): 20 mg via INTRAVENOUS
  Administered 2016-03-29: 120 mg via INTRAVENOUS

## 2016-03-29 MED ORDER — ONDANSETRON HCL 4 MG/2ML IJ SOLN
4.0000 mg | Freq: Once | INTRAMUSCULAR | Status: AC
  Administered 2016-03-29: 4 mg via INTRAVENOUS

## 2016-03-29 MED ORDER — PHENAZOPYRIDINE HCL 200 MG PO TABS
Freq: Once | ORAL | Status: AC
  Administered 2016-03-29: 15 mL via INTRAVESICAL
  Filled 2016-03-29: qty 15

## 2016-03-29 MED ORDER — ACETAMINOPHEN 650 MG RE SUPP
650.0000 mg | RECTAL | Status: DC | PRN
  Filled 2016-03-29: qty 1

## 2016-03-29 MED ORDER — GLYCOPYRROLATE 0.2 MG/ML IJ SOLN
INTRAMUSCULAR | Status: DC | PRN
  Administered 2016-03-29: .2 mg via INTRAVENOUS

## 2016-03-29 MED ORDER — SODIUM CHLORIDE 0.9% FLUSH: 3.0000 mL | Freq: Two times a day (BID) | INTRAVENOUS | Status: DC

## 2016-03-29 MED ORDER — FENTANYL CITRATE (PF) 100 MCG/2ML IJ SOLN: 25.0000 ug | INTRAMUSCULAR | Status: DC | PRN

## 2016-03-29 MED ORDER — CEFAZOLIN SODIUM-DEXTROSE 2-4 GM/100ML-% IV SOLN
2.0000 g | INTRAVENOUS | Status: AC
  Administered 2016-03-29: 2 g via INTRAVENOUS
  Filled 2016-03-29: qty 100

## 2016-03-29 MED ORDER — FENTANYL CITRATE (PF) 100 MCG/2ML IJ SOLN
25.0000 ug | INTRAMUSCULAR | Status: AC | PRN
  Administered 2016-03-29 (×2): 25 ug via INTRAVENOUS

## 2016-03-29 MED ORDER — LIDOCAINE HCL (CARDIAC) 10 MG/ML IV SOLN
INTRAVENOUS | Status: DC | PRN
  Administered 2016-03-29: 50 mg via INTRAVENOUS

## 2016-03-29 MED ORDER — FENTANYL CITRATE (PF) 100 MCG/2ML IJ SOLN
INTRAMUSCULAR | Status: DC | PRN
  Administered 2016-03-29: 25 ug via INTRAVENOUS

## 2016-03-29 MED ORDER — SEVOFLURANE IN SOLN
RESPIRATORY_TRACT | Status: AC
  Filled 2016-03-29: qty 250

## 2016-03-29 MED ORDER — PHENAZOPYRIDINE HCL 200 MG PO TABS
200.0000 mg | ORAL_TABLET | Freq: Three times a day (TID) | ORAL | 0 refills | Status: DC | PRN
Start: 2016-03-29 — End: 2016-12-17

## 2016-03-29 MED ORDER — LACTATED RINGERS IV SOLN
INTRAVENOUS | Status: DC
  Administered 2016-03-29: 11:00:00 via INTRAVENOUS

## 2016-03-29 MED ORDER — SODIUM CHLORIDE 0.9% FLUSH: 3.0000 mL | INTRAVENOUS | Status: DC | PRN

## 2016-03-29 MED ORDER — STERILE WATER FOR IRRIGATION IR SOLN
Status: DC | PRN
  Administered 2016-03-29: 1500 mL

## 2016-03-29 MED ORDER — ACETAMINOPHEN 325 MG PO TABS: 650.0000 mg | ORAL_TABLET | ORAL | Status: DC | PRN

## 2016-03-29 MED ORDER — FENTANYL CITRATE (PF) 100 MCG/2ML IJ SOLN
INTRAMUSCULAR | Status: AC
  Filled 2016-03-29: qty 2

## 2016-03-29 MED ORDER — ONDANSETRON HCL 4 MG/2ML IJ SOLN
INTRAMUSCULAR | Status: AC
  Filled 2016-03-29: qty 2

## 2016-03-29 MED ORDER — PROPOFOL 10 MG/ML IV BOLUS
INTRAVENOUS | Status: AC
  Filled 2016-03-29: qty 20

## 2016-03-29 MED ORDER — GLYCOPYRROLATE 0.2 MG/ML IJ SOLN
INTRAMUSCULAR | Status: AC
  Filled 2016-03-29: qty 1

## 2016-03-29 MED ORDER — MIDAZOLAM HCL 2 MG/2ML IJ SOLN
0.5000 mg | INTRAMUSCULAR | Status: DC | PRN
  Administered 2016-03-29 (×2): 2 mg via INTRAVENOUS

## 2016-03-29 MED ORDER — OXYCODONE HCL 5 MG PO TABS: 5.0000 mg | ORAL_TABLET | ORAL | Status: DC | PRN

## 2016-03-29 MED ORDER — LIDOCAINE HCL (PF) 1 % IJ SOLN
INTRAMUSCULAR | Status: AC
  Filled 2016-03-29: qty 5

## 2016-03-29 SURGICAL SUPPLY — 31 items
ADAPTER CATH SYR TO TUBING 38M (ADAPTER) IMPLANT
ADPR CATH LL SYR 3/32 TPR (ADAPTER)
BAG DRAIN CYSTO-URO STER (DRAIN) ×3 IMPLANT
BAG DRAIN URO TABLE W/ADPT NS (DRAPE) ×3 IMPLANT
BAG DRN 8 ADPR NS SKTRN CSTL (DRAPE) ×1
BAG HAMPER (MISCELLANEOUS) ×3 IMPLANT
CATH ROBINSON RED A/P 12FR (CATHETERS) ×1 IMPLANT
CATH ROBINSON RED A/P 16FR (CATHETERS) ×3 IMPLANT
CATH ROBINSON RED A/P 18FR (CATHETERS) IMPLANT
CLOTH BEACON ORANGE TIMEOUT ST (SAFETY) ×6 IMPLANT
ELECT REM PT RETURN 9FT ADLT (ELECTROSURGICAL)
ELECTRODE REM PT RTRN 9FT ADLT (ELECTROSURGICAL) IMPLANT
GLOVE SURG SS PI 8.0 STRL IVOR (GLOVE) ×3 IMPLANT
GOWN STRL REUS W/ TWL LRG LVL3 (GOWN DISPOSABLE) ×1 IMPLANT
GOWN STRL REUS W/ TWL XL LVL3 (GOWN DISPOSABLE) ×1 IMPLANT
GOWN STRL REUS W/TWL LRG LVL3 (GOWN DISPOSABLE) ×6 IMPLANT
GOWN STRL REUS W/TWL XL LVL3 (GOWN DISPOSABLE) ×3
KIT ROOM TURNOVER AP CYSTO (KITS) ×3 IMPLANT
MANIFOLD NEPTUNE II (INSTRUMENTS) ×3 IMPLANT
NDL SAFETY ECLIPSE 18X1.5 (NEEDLE) IMPLANT
NEEDLE HYPO 18GX1.5 SHARP (NEEDLE)
NS IRRIG 1000ML POUR BTL (IV SOLUTION) ×3 IMPLANT
PACK CYSTO (CUSTOM PROCEDURE TRAY) ×3 IMPLANT
PAD ARMBOARD 7.5X6 YLW CONV (MISCELLANEOUS) ×3 IMPLANT
PLUG CATH AND CAP STER (CATHETERS) IMPLANT
SYR 30ML LL (SYRINGE) IMPLANT
SYR BULB IRRIGATION 50ML (SYRINGE) IMPLANT
SYRINGE IRR TOOMEY STRL 70CC (SYRINGE) ×3 IMPLANT
TOWEL OR 17X24 6PK STRL BLUE (TOWEL DISPOSABLE) ×3 IMPLANT
TOWEL OR 17X26 4PK STRL BLUE (TOWEL DISPOSABLE) ×3 IMPLANT
WATER STERILE IRR 3000ML UROMA (IV SOLUTION) ×4 IMPLANT

## 2016-03-29 NOTE — Anesthesia Preprocedure Evaluation (Signed)
Anesthesia Evaluation  Patient identified by MRN, date of birth, ID band Patient awake    Reviewed: Allergy & Precautions, H&P , NPO status , Patient's Chart, lab work & pertinent test results  Airway Mallampati: II  TM Distance: >3 FB     Dental  (+) Teeth Intact   Pulmonary shortness of breath, former smoker,    breath sounds clear to auscultation       Cardiovascular hypertension, Pt. on medications + CAD and + Peripheral Vascular Disease   Rhythm:Regular Rate:Normal     Neuro/Psych TIA   GI/Hepatic GERD  Controlled and Medicated,  Endo/Other  diabetes, Type 2, Oral Hypoglycemic AgentsHypothyroidism   Renal/GU      Musculoskeletal  (+) Fibromyalgia -  Abdominal   Peds  Hematology   Anesthesia Other Findings   Reproductive/Obstetrics                             Anesthesia Physical Anesthesia Plan  ASA: III  Anesthesia Plan: General   Post-op Pain Management:    Induction: Intravenous  Airway Management Planned: LMA  Additional Equipment:   Intra-op Plan:   Post-operative Plan: Extubation in OR  Informed Consent: I have reviewed the patients History and Physical, chart, labs and discussed the procedure including the risks, benefits and alternatives for the proposed anesthesia with the patient or authorized representative who has indicated his/her understanding and acceptance.     Plan Discussed with:   Anesthesia Plan Comments:         Anesthesia Quick Evaluation

## 2016-03-29 NOTE — Anesthesia Procedure Notes (Signed)
Procedure Name: LMA Insertion Date/Time: 03/29/2016 12:12 PM Performed by: Vista Deck Pre-anesthesia Checklist: Patient identified, Patient being monitored, Emergency Drugs available, Timeout performed and Suction available Patient Re-evaluated:Patient Re-evaluated prior to inductionOxygen Delivery Method: Circle System Utilized Preoxygenation: Pre-oxygenation with 100% oxygen Intubation Type: IV induction Ventilation: Mask ventilation without difficulty LMA: LMA inserted LMA Size: 3.0 Number of attempts: 3 Placement Confirmation: positive ETCO2 and breath sounds checked- equal and bilateral Tube secured with: Tape Dental Injury: Teeth and Oropharynx as per pre-operative assessment

## 2016-03-29 NOTE — Brief Op Note (Signed)
03/29/2016  12:36 PM  PATIENT:  Debra Hopkins  76 y.o. female  PRE-OPERATIVE DIAGNOSIS:  painful bladder with urgency  POST-OPERATIVE DIAGNOSIS:  Interstitial cystitis  PROCEDURE:  Procedure(s): CYSTOSCOPY/HYDRODISTENSION (N/A) CYSTOSCOPY WITH PYRIDIUM AND MARCAINE INSTALLATION (N/A)  SURGEON:  Surgeon(s) and Role:    * Irine Seal, MD - Primary  PHYSICIAN ASSISTANT:   ASSISTANTS: none   ANESTHESIA:   general  EBL:  Total I/O In: 800 [I.V.:800] Out: 0   BLOOD ADMINISTERED:none  DRAINS: none   LOCAL MEDICATIONS USED:  MARCAINE    and Amount: 15 ml 0.5% with 400mg  pyridium intravesically  SPECIMEN:  No Specimen  DISPOSITION OF SPECIMEN:  N/A  COUNTS:  YES  TOURNIQUET:  * No tourniquets in log *  DICTATION: .Other Dictation: Dictation Number [id number]  PLAN OF CARE: Discharge to home after PACU  PATIENT DISPOSITION:  PACU - hemodynamically stable.   Delay start of Pharmacological VTE agent (>24hrs) due to surgical blood loss or risk of bleeding: not applicable

## 2016-03-29 NOTE — Interval H&P Note (Signed)
History and Physical Interval Note:  She is having bladder pressure.  03/29/2016 11:56 AM  Rexene Edison  has presented today for surgery, with the diagnosis of painful bladder with urgency  The various methods of treatment have been discussed with the patient and family. After consideration of risks, benefits and other options for treatment, the patient has consented to  Procedure(s): CYSTOSCOPY/HYDRODISTENSION (N/A) CYSTOSCOPY WITH PYRIDIUM AND MARCAINE INSTALLATION (N/A) as a surgical intervention .  The patient's history has been reviewed, patient examined, no change in status, stable for surgery.  I have reviewed the patient's chart and labs.  Questions were answered to the patient's satisfaction.     Malvern Kadlec J

## 2016-03-29 NOTE — Transfer of Care (Signed)
Immediate Anesthesia Transfer of Care Note  Patient: Debra Hopkins  Procedure(s) Performed: Procedure(s): CYSTOSCOPY/HYDRODISTENSION (N/A) CYSTOSCOPY WITH PYRIDIUM AND MARCAINE INSTALLATION (N/A)  Patient Location: PACU  Anesthesia Type:General  Level of Consciousness: awake and patient cooperative  Airway & Oxygen Therapy: Patient Spontanous Breathing and non-rebreather face mask  Post-op Assessment: Report given to RN and Post -op Vital signs reviewed and stable  Post vital signs: Reviewed and stable  Last Vitals:  Vitals:   03/29/16 1130 03/29/16 1140  BP: (!) 140/54 (!) 126/54  Resp: 16 11  Temp:      Last Pain:  Vitals:   03/29/16 1020  TempSrc: Oral      Patients Stated Pain Goal: 7 ([phone] [id number])  Complications: No apparent anesthesia complications

## 2016-03-29 NOTE — Discharge Instructions (Addendum)
CYSTOSCOPY HOME CARE INSTRUCTIONS  Activity: Rest for the remainder of the day.  Do not drive or operate equipment today.  You may resume normal activities in one to two days as instructed by your physician.   Meals: Drink plenty of liquids and eat light foods such as gelatin or soup this evening.  You may return to a normal meal plan tomorrow.  Return to Work: You may return to work in one to two days or as instructed by your physician.  Special Instructions / Symptoms: Call your physician if any of these symptoms occur:   -persistent or heavy bleeding  -bleeding which continues after first few urination  -large blood clots that are difficult to pass  -urine stream diminishes or stops completely  -fever equal to or higher than 101 degrees Farenheit.  -cloudy urine with a strong, foul odor  -severe pain  Females should always wipe from front to back after elimination.  You may feel some burning pain when you urinate.  This should disappear with time.  Applying moist heat to the lower abdomen or a hot tub bath may help relieve the pain. \   Patient Signature:  ________________________________________________________  Nurse's Signature:  ________________________________________________________      PATIENT INSTRUCTIONS POST-ANESTHESIA  IMMEDIATELY FOLLOWING SURGERY:  Do not drive or operate machinery for the first twenty four hours after surgery.  Do not make any important decisions for twenty four hours after surgery or while taking narcotic pain medications or sedatives.  If you develop intractable nausea and vomiting or a severe headache please notify your doctor immediately.  FOLLOW-UP:  Please make an appointment with your surgeon as instructed. You do not need to follow up with anesthesia unless specifically instructed to do so.  WOUND CARE INSTRUCTIONS (if applicable):  Keep a dry clean dressing on the anesthesia/puncture wound site if there is drainage.  Once the wound  has quit draining you may leave it open to air.  Generally you should leave the bandage intact for twenty four hours unless there is drainage.  If the epidural site drains for more than 36-48 hours please call the anesthesia department.  QUESTIONS?:  Please feel free to call your physician or the hospital operator if you have any questions, and they will be happy to assist you.

## 2016-03-29 NOTE — Anesthesia Postprocedure Evaluation (Signed)
Anesthesia Post Note  Patient: Debra Hopkins  Procedure(s) Performed: Procedure(s) (LRB): CYSTOSCOPY/HYDRODISTENSION (N/A) CYSTOSCOPY WITH PYRIDIUM AND MARCAINE INSTALLATION (N/A)  Patient location during evaluation: PACU Anesthesia Type: General Level of consciousness: awake and alert Pain management: satisfactory to patient Respiratory status: spontaneous breathing Cardiovascular status: stable Anesthetic complications: no     Last Vitals:  Vitals:   03/29/16 1300 03/29/16 1315  BP: (!) 135/56 (!) 134/54  Pulse: 74 73  Resp: 12 11  Temp:      Last Pain:  Vitals:   03/29/16 1020  TempSrc: Oral                 Audry Pecina

## 2016-03-30 NOTE — Op Note (Signed)
Debra Hopkins, Debra Hopkins                  ACCOUNT NO.:  [id number]  MEDICAL RECORD NO.:  [id number]  LOCATION:  NDM                          FACILITY:  [facility]  PHYSICIAN:  Debra Hopkins, M.D.    DATE OF BIRTH:  [birth year]  DATE OF PROCEDURE:  03/29/2016 DATE OF DISCHARGE:                              OPERATIVE REPORT   PATIENT OF:  Debra Hopkins, M.D.  PROCEDURE: 1. Cystoscopy with hydrodistention in the bladder. 2. Installation of Pyridium and Marcaine. 3. Vaginal exam.  PREOPERATIVE DIAGNOSIS:  Painful bladder with urgency.  POSTOPERATIVE DIAGNOSIS:  Painful bladder with urgency with possible interstitial cystitis.  SURGEON:  Debra Hopkins, M.D.  ANESTHESIA:  General.  SPECIMEN:  None.  DRAINS:  None.  BLOOD LOSS:  None.  COMPLICATIONS:  None.  INDICATIONS:  Ms. Munzer is a 76 year old white female with painful bladder with urgency.  After reviewing the options, she has elected to undergo anesthetic cystoscopy with hydrodistention and instillation of Pyridium and Marcaine to rule out interstitial cystitis.  She also asked for vaginal exam to rule out prolapse.  FINDINGS OF PROCEDURE:  She was taken to the operating room where general anesthetic was induced.  She was given 2 g of Ancef.  She was placed in lithotomy position and was fitted with PAS hose.  Her perineum and genitalia were prepped with Betadine solution.  She was draped in usual sterile fashion.  Cystoscopy was performed using the 23-French scope and 30-degree lens. Examination revealed a normal urethra.  The external sphincter was intact.  The ureteral orifices were unremarkable.  There was mild trabeculation, but no mucosal lesions.  After thorough cystoscopy, the bladder was distended under 80 cm of water pressure to capacity and then drained.  Her capacity under anesthesia was 1000 mL.  Repeat cystoscopy after drainage revealed diffuse glomerular hemorrhages suggestive of interstitial cystitis.  The  cystoscope was removed and a 16-French red rubber catheter was placed.  The bladder was drained and 15 mL of 0.5% Marcaine admixed with 400 mg of crushed Pyridium was instilled into the bladder and the catheter was removed.  A vaginal speculum exam was performed.  She has moderately severe atrophic vaginitis.  A minimal cystocele and small rectocele, but no significant prolapse.  She was then taken down from lithotomy position. Her anesthetic was reversed.  She was moved to recovery room in stable condition.  There were no complications.     Debra Hopkins, M.D.     JJW/MEDQ  D:  03/29/2016  T:  03/30/2016  Job:  [id number]

## 2016-03-30 NOTE — Op Note (Deleted)
  The note originally documented on this encounter has been moved the the encounter in which it belongs.  

## 2016-04-02 ENCOUNTER — Ambulatory Visit: Payer: Medicare Other | Admitting: Family

## 2016-04-02 ENCOUNTER — Ambulatory Visit (INDEPENDENT_AMBULATORY_CARE_PROVIDER_SITE_OTHER)
Admission: RE | Admit: 2016-04-02 | Discharge: 2016-04-02 | Disposition: A | Discharge status: Home / Self Care | Payer: Medicare Other | Source: Ambulatory Visit | Attending: Family | Admitting: Family

## 2016-04-02 ENCOUNTER — Encounter (HOSPITAL_COMMUNITY): Payer: Medicare Other

## 2016-04-02 ENCOUNTER — Ambulatory Visit (INDEPENDENT_AMBULATORY_CARE_PROVIDER_SITE_OTHER): Payer: Medicare Other | Admitting: Family

## 2016-04-02 ENCOUNTER — Ambulatory Visit (HOSPITAL_COMMUNITY)
Admission: RE | Admit: 2016-04-02 | Discharge: 2016-04-02 | Disposition: A | Discharge status: Home / Self Care | Payer: Medicare Other | Source: Ambulatory Visit | Attending: Family | Admitting: Family

## 2016-04-02 ENCOUNTER — Encounter (HOSPITAL_COMMUNITY): Payer: Self-pay | Admitting: Urology

## 2016-04-02 VITALS — BP 120/70 | HR 68 | Temp 97.8°F | Resp 20 | Ht 63.0 in | Wt 207.0 lb

## 2016-04-02 DIAGNOSIS — E785 Hyperlipidemia, unspecified: Secondary | ICD-10-CM | POA: Insufficient documentation

## 2016-04-02 DIAGNOSIS — Z87891 Personal history of nicotine dependence: Secondary | ICD-10-CM | POA: Diagnosis not present

## 2016-04-02 DIAGNOSIS — I6523 Occlusion and stenosis of bilateral carotid arteries: Secondary | ICD-10-CM

## 2016-04-02 DIAGNOSIS — Z9889 Other specified postprocedural states: Secondary | ICD-10-CM | POA: Diagnosis not present

## 2016-04-02 DIAGNOSIS — I739 Peripheral vascular disease, unspecified: Secondary | ICD-10-CM

## 2016-04-02 DIAGNOSIS — Z959 Presence of cardiac and vascular implant and graft, unspecified: Secondary | ICD-10-CM | POA: Diagnosis not present

## 2016-04-02 DIAGNOSIS — I10 Essential (primary) hypertension: Secondary | ICD-10-CM | POA: Diagnosis not present

## 2016-04-02 DIAGNOSIS — I6521 Occlusion and stenosis of right carotid artery: Secondary | ICD-10-CM | POA: Diagnosis not present

## 2016-04-02 LAB — VAS US CAROTID
LEFT ECA DIAS: -7 cm/s
LEFT VERTEBRAL DIAS: -17 cm/s
LICADDIAS: -28 cm/s
LICAPSYS: -123 cm/s
Left CCA dist dias: 24 cm/s
Left CCA dist sys: 103 cm/s
Left CCA prox dias: 24 cm/s
Left CCA prox sys: 137 cm/s
Left ICA dist sys: -121 cm/s
Left ICA prox dias: -17 cm/s
RCCADSYS: -80 cm/s
RIGHT CCA MID DIAS: 16 cm/s
RIGHT ECA DIAS: -19 cm/s
RIGHT VERTEBRAL DIAS: 15 cm/s
Right CCA prox dias: 13 cm/s
Right CCA prox sys: 103 cm/s

## 2016-04-02 NOTE — Patient Instructions (Signed)
Stroke Prevention Some medical conditions and behaviors are associated with an increased chance of having a stroke. You may prevent a stroke by making healthy choices and managing medical conditions. How can I reduce my risk of having a stroke?  Stay physically active. Get at least 30 minutes of activity on most or all days.  Do not smoke. It may also be helpful to avoid exposure to secondhand smoke.  Limit alcohol use. Moderate alcohol use is considered to be:  No more than 2 drinks per day for men.  No more than 1 drink per day for nonpregnant women.  Eat healthy foods. This involves:  Eating 5 or more servings of fruits and vegetables a day.  Making dietary changes that address high blood pressure (hypertension), high cholesterol, diabetes, or obesity.  Manage your cholesterol levels.  Making food choices that are high in fiber and low in saturated fat, trans fat, and cholesterol may control cholesterol levels.  Take any prescribed medicines to control cholesterol as directed by your health care provider.  Manage your diabetes.  Controlling your carbohydrate and sugar intake is recommended to manage diabetes.  Take any prescribed medicines to control diabetes as directed by your health care provider.  Control your hypertension.  Making food choices that are low in salt (sodium), saturated fat, trans fat, and cholesterol is recommended to manage hypertension.  Ask your health care provider if you need treatment to lower your blood pressure. Take any prescribed medicines to control hypertension as directed by your health care provider.  If you are 18-39 years of age, have your blood pressure checked every 3-5 years. If you are 40 years of age or older, have your blood pressure checked every year.  Maintain a healthy weight.  Reducing calorie intake and making food choices that are low in sodium, saturated fat, trans fat, and cholesterol are recommended to manage  weight.  Stop drug abuse.  Avoid taking birth control pills.  Talk to your health care provider about the risks of taking birth control pills if you are over 35 years old, smoke, get migraines, or have ever had a blood clot.  Get evaluated for sleep disorders (sleep apnea).  Talk to your health care provider about getting a sleep evaluation if you snore a lot or have excessive sleepiness.  Take medicines only as directed by your health care provider.  For some people, aspirin or blood thinners (anticoagulants) are helpful in reducing the risk of forming abnormal blood clots that can lead to stroke. If you have the irregular heart rhythm of atrial fibrillation, you should be on a blood thinner unless there is a good reason you cannot take them.  Understand all your medicine instructions.  Make sure that other conditions (such as anemia or atherosclerosis) are addressed. Get help right away if:  You have sudden weakness or numbness of the face, arm, or leg, especially on one side of the body.  Your face or eyelid droops to one side.  You have sudden confusion.  You have trouble speaking (aphasia) or understanding.  You have sudden trouble seeing in one or both eyes.  You have sudden trouble walking.  You have dizziness.  You have a loss of balance or coordination.  You have a sudden, severe headache with no known cause.  You have new chest pain or an irregular heartbeat. Any of these symptoms may represent a serious problem that is an emergency. Do not wait to see if the symptoms will go away.   Get medical help at once. Call your local emergency services (911 in [place] Do not drive yourself to the hospital.  This information is not intended to replace advice given to you by your health care provider. Make sure you discuss any questions you have with your health care provider. Document Released: 04/04/2004 Document Revised: 08/03/2015 Document Reviewed: 08/28/2012 Elsevier  Interactive Patient Education  2017 Elsevier Inc.      Peripheral Vascular Disease Peripheral vascular disease (PVD) is a disease of the blood vessels that are not part of your heart and brain. A simple term for PVD is poor circulation. In most cases, PVD narrows the blood vessels that carry blood from your heart to the rest of your body. This can result in a decreased supply of blood to your arms, legs, and internal organs, like your stomach or kidneys. However, it most often affects a person's lower legs and feet. There are two types of PVD.  Organic PVD. This is the more common type. It is caused by damage to the structure of blood vessels.  Functional PVD. This is caused by conditions that make blood vessels contract and tighten (spasm). Without treatment, PVD tends to get worse over time. PVD can also lead to acute ischemic limb. This is when an arm or limb suddenly has trouble getting enough blood. This is a medical emergency. Follow these instructions at home:  Take medicines only as told by your doctor.  Do not use any tobacco products, including cigarettes, chewing tobacco, or electronic cigarettes. If you need help quitting, ask your doctor.  Lose weight if you are overweight, and maintain a healthy weight as told by your doctor.  Eat a diet that is low in fat and cholesterol. If you need help, ask your doctor.  Exercise regularly. Ask your doctor for some good activities for you.  Take good care of your feet.  Wear comfortable shoes that fit well.  Check your feet often for any cuts or sores. Contact a doctor if:  You have cramps in your legs while walking.  You have leg pain when you are at rest.  You have coldness in a leg or foot.  Your skin changes.  You are unable to get or have an erection (erectile dysfunction).  You have cuts or sores on your feet that are not healing. Get help right away if:  Your arm or leg turns cold and blue.  Your arms or legs  become red, warm, swollen, painful, or numb.  You have chest pain or trouble breathing.  You suddenly have weakness in your face, arm, or leg.  You become very confused or you cannot speak.  You suddenly have a very bad headache.  You suddenly cannot see. This information is not intended to replace advice given to you by your health care provider. Make sure you discuss any questions you have with your health care provider. Document Released: 05/22/2009 Document Revised: 08/03/2015 Document Reviewed: 08/05/2013 Elsevier Interactive Patient Education  2017 Elsevier Inc.  

## 2016-04-02 NOTE — Progress Notes (Signed)
[facility]  HISTORY AND PHYSICAL   MRN : [id number]  History of Present Illness:   Debra Hopkins is a 76 y.o. female patient of Dr. Kellie Simmering who is status post left CEA in September 2008, left SFA PTA with stent 2003. She returns today for surveillance. Cramping in left calf after walking about 1/2 block, no change in claudication symptoms in the last few years; denies non-healing wounds, denies rest pain, reports mild right calf claudication is minor. 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 She did not have hemiplegia, monocular blindness, facial drooping, or aphasia. Her DM is in good control, she stopped smoking in the 1990's, she takes a daily ASA and a statin, she is obese. She rarely drinks ETOH.  She will start seated leg exercises soon through the [facility].   On endoscopy it was found she has a hiatal hernia, diverticulosis; a small amount of fresh blood was found in the small intestine; she is anemic, had iron infusions.   Pt Diabetic: Yes, states in good control, states her last A1C was 7.0 Pt smoker: former smoker, quit in the 1990's  Pt meds include: Statin :Yes ASA: Yes Other anticoagulants/antiplatelets: no    Current Outpatient Prescriptions  Medication Sig Dispense Refill  . Al Hyd-Mg Tr-Alg Ac-Sod Bicarb (GAVISCON-2 PO) Take by mouth.    . Alum Hydroxide-Mag Trisilicate (GAVISCON) A999333 MG CHEW Chew 2-3 tablets by mouth daily as needed (acid reflux).     Marland Kitchen aspirin (ECOTRIN LOW STRENGTH) 81 MG EC tablet Take 81 mg by mouth daily. Swallow whole.    . B Complex-Biotin-FA (SUPER B-COMPLEX PO) Take 1 tablet by mouth daily.     . Calcium Citrate (CALCITRATE PO) Take 1 tablet  by mouth daily.     . Carboxymeth-Glycerin-Polysorb (REFRESH OPTIVE ADVANCED) 0.5-1-0.5 % SOLN Apply 1 drop to eye daily as needed (DRY EYES).    . Cholecalciferol (VITAMIN D) 2000 units tablet Take 2,000 Units by mouth daily.    . cloNIDine (CATAPRES) 0.1 MG tablet Take 0.1-0.2 mg by mouth 3 (three) times daily. TAKES 1 TABLET IN THE MORNING AND 2 TABLETS AT BEDTIME    . esomeprazole (NEXIUM) 40 MG capsule Take 40 mg by mouth daily before breakfast.    . fexofenadine (ALLEGRA) 30 MG/5ML suspension Take 10 mg by mouth daily. TAKES CHILDREN'S LIQUID    . hydrALAZINE (APRESOLINE) 25 MG tablet Take 25 mg by mouth daily.    . Insulin Glargine (LANTUS SOLOSTAR) 100 UNIT/ML Solostar Pen Inject 30 Units into the skin daily at 10 pm. 15 mL 2  . Insulin Pen Needle (B-D ULTRAFINE III SHORT PEN) 31G X 8 MM MISC 1 each by Does not apply route as directed. 100 each 3  . ketotifen (ALAWAY) 0.025 % ophthalmic solution Place 1 drop into both eyes daily as needed (ALLERGIES).    Marland Kitchen levothyroxine (SYNTHROID, LEVOTHROID) 50 MCG tablet Take 50 mcg by mouth daily before breakfast.    . losartan (COZAAR) 100 MG tablet Take 100 mg by mouth daily.      . Magnesium 250 MG TABS Take 1 tablet by mouth at bedtime.     . METHYLCELLULOSE, LAXATIVE, PO  Take 15 mLs by mouth every Monday, Wednesday, and Friday.    . Misc Natural Products (OSTEO BI-FLEX ADV JOINT SHIELD PO) Take 1 tablet by mouth every other day.     . Multiple Vitamins-Calcium (ONE-A-DAY WOMENS PO) Take by mouth.    . Multiple Vitamins-Minerals (PRESERVISION AREDS 2 PO) Take 2 tablets by mouth daily.    Marland Kitchen NIFEdipine (PROCARDIA-XL/ADALAT-CC/NIFEDICAL-XL) 30 MG 24 hr tablet Take 30 mg by mouth daily.    Marland Kitchen omega-3 acid ethyl esters (LOVAZA) 1 g capsule Take 1 g by mouth daily.     Marland Kitchen OVER THE COUNTER MEDICATION Take 1-2 tablets by mouth 2 (two) times daily. RAINBOW LIGHT MULTI VITAMIN TAKE 1 AT BREAKFAST AND 2 AT LUNCH    . oxyCODONE-acetaminophen (PERCOCET/ROXICET)  5-325 MG per tablet Take 0.5 tablets by mouth daily as needed for moderate pain or severe pain.     . phenazopyridine (PYRIDIUM) 200 MG tablet Take 1 tablet (200 mg total) by mouth 3 (three) times daily as needed for pain. 10 tablet 0  . Polyvinyl Alcohol-Povidone (REFRESH OP) Apply to eye.    Marland Kitchen Propylene Glycol (SYSTANE BALANCE OP) Apply 1 drop to eye daily as needed (DRY EYES).    Marland Kitchen simvastatin (ZOCOR) 10 MG tablet Take 10 mg by mouth every other day.    . SUPER B COMPLEX/C PO Take by mouth.    . torsemide (DEMADEX) 20 MG tablet Take 20-40 mg by mouth daily. TAKES 20 MG DAILY IF NOTICES SWELLING CAN TAKE AN ADDITIONAL TABLET IF NEEDED    . traZODone (DESYREL) 100 MG tablet Take 100 mg by mouth at bedtime.     No current facility-administered medications for this visit.     Past Medical History:  Diagnosis Date  . Arthritis   . CAD (coronary artery disease)   . Carotid artery occlusion   . Cataracts, bilateral   . DDD (degenerative disc disease)   . Diabetes mellitus    Type 2  . Diverticulitis   . Dyspnea   . Fibromyalgia   . GERD (gastroesophageal reflux disease)   . Hemorrhoids   . History of shingles   . Impaired hearing   . Macular degeneration   . Other and unspecified hyperlipidemia   . PVD (peripheral vascular disease) ([place])   . TIA (transient ischemic attack)   . Unspecified essential hypertension   . Varicose veins     Social History Social History  Substance Use Topics  . Smoking status: Former Smoker    Types: Cigarettes    Quit date: 03/11/1985  . Smokeless tobacco: Never Used  . Alcohol use No    Family History Family History  Problem Relation Age of Onset  . Stroke Mother   . Hypertension Mother   . Stroke Father   . Hypertension Father   . Diabetes Father   . Coronary artery disease Father   . Diabetes Sister   . Hypertension Sister   . Lung cancer Brother   . Sinusitis Brother   . Healthy Brother   . Diabetes Son   . Hypertension Son   .  Coronary artery disease Other     Surgical History Past Surgical History:  Procedure Laterality Date  . ANGIOPLASTY     left leg 2003  . APPENDECTOMY    . aspiration of a cyst in the right breast    . BALLOON DILATION N/A 10/01/2013   Procedure: BALLOON DILATION;  Surgeon: Rogene Houston, MD;  [facility]: [facility];  Service: Endoscopy;  Laterality: N/A;  . BREAST BIOPSY    . BREAST BIOPSY Left   . CARDIAC CATHETERIZATION    . CAROTID ENDARTERECTOMY  12-03-06   left CEA  . CATARACT EXTRACTION W/PHACO Right 10/14/2013   Procedure: CATARACT EXTRACTION PHACO AND INTRAOCULAR LENS PLACEMENT (IOC);  Surgeon: Tonny Branch, MD;  Location: [facility];  Service: Ophthalmology;  Laterality: Right;  CDE 7.20  . CATARACT EXTRACTION W/PHACO Left 11/04/2013   Procedure: CATARACT EXTRACTION PHACO AND INTRAOCULAR LENS PLACEMENT (IOC);  Surgeon: Tonny Branch, MD;  Location: [facility];  Service: Ophthalmology;  Laterality: Left;  CDE 8.57  . COLONOSCOPY N/A 09/19/2014   Procedure: COLONOSCOPY;  Surgeon: Rogene Houston, MD;  [facility]: [facility];  Service: Endoscopy;  Laterality: N/A;  7:30-8:30  . CYSTO WITH HYDRODISTENSION N/A 03/29/2016   Procedure: CYSTOSCOPY/HYDRODISTENSION;  Surgeon: Irine Seal, MD;  Location: [facility];  Service: Urology;  Laterality: N/A;  . CYSTOSCOPY WITH INJECTION N/A 03/29/2016   Procedure: CYSTOSCOPY WITH PYRIDIUM AND MARCAINE INSTALLATION;  Surgeon: Irine Seal, MD;  Location: [facility];  Service: Urology;  Laterality: N/A;  . ESOPHAGOGASTRODUODENOSCOPY N/A 10/01/2013   Procedure: ESOPHAGOGASTRODUODENOSCOPY (EGD);  Surgeon: Rogene Houston, MD;  [facility];  Service: Endoscopy;  Laterality: N/A;  120  . ESOPHAGOGASTRODUODENOSCOPY N/A 10/24/2014   Procedure: ESOPHAGOGASTRODUODENOSCOPY (EGD);  Surgeon: Rogene Houston, MD;  [facility];  Service: Endoscopy;  Laterality: N/A;  730  . GIVENS CAPSULE STUDY N/A 10/24/2014   Procedure: GIVENS CAPSULE STUDY;  Surgeon: Rogene Houston, MD;  [facility];  Service: Endoscopy;  Laterality: N/A;  . JOINT REPLACEMENT  2011   Right total knee replacement  . [place]  . KNEE ARTHROSCOPY  2009  . MALONEY DILATION N/A 10/01/2013   Procedure: Venia Minks DILATION;  Surgeon: Rogene Houston, MD;  [facility];  Service: Endoscopy;  Laterality: N/A;  . SAVORY DILATION N/A 10/01/2013   Procedure: SAVORY DILATION;  Surgeon: Rogene Houston, MD;  [facility];  Service: Endoscopy;  Laterality: N/A;  . SPINE SURGERY  1997   C5-c6 fusion  . TOTAL ABDOMINAL HYSTERECTOMY  1973    Allergies  Allergen Reactions  . Amoxicillin-Pot Clavulanate Hives    Has patient had a PCN reaction causing immediate rash, facial/tongue/throat swelling, SOB or lightheadedness with hypotension: UNKNOWN Has patient had a PCN reaction causing severe rash involving mucus membranes or skin necrosis: UNKNOWN Has patient had a PCN reaction that required hospitalization No Has patient had a PCN reaction occurring within the last 10 years: No If all of the above answers are "NO", then may proceed with Cephalosporin use.   . Ciprofloxacin Nausea Only    Dizziness  . Erythromycin     REACTION: Upset stomach  . Motrin [Ibuprofen]     REACTION: nausea  . Naproxen     REACTION: severe nausea  . Prednisone     REACTION: itchy rash  . Sulfamethoxazole-Trimethoprim Nausea And Vomiting    REACTION: Upset stomach    Current Outpatient Prescriptions  Medication Sig Dispense Refill  . Al Hyd-Mg Tr-Alg Ac-Sod Bicarb (GAVISCON-2 PO) Take by mouth.    . Alum Hydroxide-Mag Trisilicate (GAVISCON) A999333 MG CHEW Chew 2-3 tablets by mouth daily as needed (acid reflux).     Marland Kitchen aspirin (ECOTRIN LOW STRENGTH) 81 MG EC tablet Take 81 mg by mouth daily. Swallow whole.    . B Complex-Biotin-FA (SUPER B-COMPLEX PO) Take 1  tablet by mouth daily.     . Calcium Citrate (CALCITRATE PO) Take 1 tablet by mouth daily.     .  Carboxymeth-Glycerin-Polysorb (REFRESH OPTIVE ADVANCED) 0.5-1-0.5 % SOLN Apply 1 drop to eye daily as needed (DRY EYES).    . Cholecalciferol (VITAMIN D) 2000 units tablet Take 2,000 Units by mouth daily.    . cloNIDine (CATAPRES) 0.1 MG tablet Take 0.1-0.2 mg by mouth 3 (three) times daily. TAKES 1 TABLET IN THE MORNING AND 2 TABLETS AT BEDTIME    . esomeprazole (NEXIUM) 40 MG capsule Take 40 mg by mouth daily before breakfast.    . fexofenadine (ALLEGRA) 30 MG/5ML suspension Take 10 mg by mouth daily. TAKES CHILDREN'S LIQUID    . hydrALAZINE (APRESOLINE) 25 MG tablet Take 25 mg by mouth daily.    . Insulin Glargine (LANTUS SOLOSTAR) 100 UNIT/ML Solostar Pen Inject 30 Units into the skin daily at 10 pm. 15 mL 2  . Insulin Pen Needle (B-D ULTRAFINE III SHORT PEN) 31G X 8 MM MISC 1 each by Does not apply route as directed. 100 each 3  . ketotifen (ALAWAY) 0.025 % ophthalmic solution Place 1 drop into both eyes daily as needed (ALLERGIES).    Marland Kitchen levothyroxine (SYNTHROID, LEVOTHROID) 50 MCG tablet Take 50 mcg by mouth daily before breakfast.    . losartan (COZAAR) 100 MG tablet Take 100 mg by mouth daily.      . Magnesium 250 MG TABS Take 1 tablet by mouth at bedtime.     . METHYLCELLULOSE, LAXATIVE, PO Take 15 mLs by mouth every Monday, Wednesday, and Friday.    . Misc Natural Products (OSTEO BI-FLEX ADV JOINT SHIELD PO) Take 1 tablet by mouth every other day.     . Multiple Vitamins-Calcium (ONE-A-DAY WOMENS PO) Take by mouth.    . Multiple Vitamins-Minerals (PRESERVISION AREDS 2 PO) Take 2 tablets by mouth daily.    Marland Kitchen NIFEdipine (PROCARDIA-XL/ADALAT-CC/NIFEDICAL-XL) 30 MG 24 hr tablet Take 30 mg by mouth daily.    Marland Kitchen omega-3 acid ethyl esters (LOVAZA) 1 g capsule Take 1 g by mouth daily.     Marland Kitchen OVER THE COUNTER MEDICATION Take 1-2 tablets by mouth 2 (two) times daily. RAINBOW LIGHT MULTI VITAMIN TAKE 1 AT BREAKFAST AND 2 AT LUNCH    . oxyCODONE-acetaminophen (PERCOCET/ROXICET) 5-325 MG per tablet  Take 0.5 tablets by mouth daily as needed for moderate pain or severe pain.     . phenazopyridine (PYRIDIUM) 200 MG tablet Take 1 tablet (200 mg total) by mouth 3 (three) times daily as needed for pain. 10 tablet 0  . Polyvinyl Alcohol-Povidone (REFRESH OP) Apply to eye.    Marland Kitchen Propylene Glycol (SYSTANE BALANCE OP) Apply 1 drop to eye daily as needed (DRY EYES).    Marland Kitchen simvastatin (ZOCOR) 10 MG tablet Take 10 mg by mouth every other day.    . SUPER B COMPLEX/C PO Take by mouth.    . torsemide (DEMADEX) 20 MG tablet Take 20-40 mg by mouth daily. TAKES 20 MG DAILY IF NOTICES SWELLING CAN TAKE AN ADDITIONAL TABLET IF NEEDED    . traZODone (DESYREL) 100 MG tablet Take 100 mg by mouth at bedtime.     No current facility-administered medications for this visit.      REVIEW OF SYSTEMS: See HPI for pertinent positives and negatives.  Physical Examination Vitals:   04/02/16 1303  BP: 120/70  Pulse: 68  Resp: 20  Temp: 97.8 F (36.6 C)  TempSrc: Oral  SpO2: 96%  Weight: 207 lb (93.9 kg)  Height: 5\' 3"  (1.6 m)   Body mass index is 36.67 kg/m.  General: Obese female in NAD  Gait: Normal  HENT: WNL  Eyes: Pupils are equal and react appropriately to light Pulmonary: normal non-labored breathing, no rales, rhonchi, or wheezing  Cardiac: RRR, + Murmur  Abdomen: soft, NT, no palpable masses  Skin: no rashes, no ulcers, no cellulitis.  VASCULAR EXAM  Carotid Bruits  Left  Right    positive  Positive   VASCULAR EXAM:  Extremities without ischemic changes, 1+ bilateral pitting pretibial edema  without Gangrene; without open wounds.  LE Pulses  LEFT  RIGHT   FEMORAL  1+ palpable  1+ palpable   POPLITEAL  not palpable  not palpable   POSTERIOR TIBIAL  not palpable  not palpable   DORSALIS PEDIS  ANTERIOR TIBIAL  not palpable  not palpable   Musculoskeletal: no muscle wasting or atrophy; trace bilateral LE pretiibial edema  Neurologic: A&O X 3; Appropriate Affect ;  SENSATION: normal;   MOTOR FUNCTION: 4/5 Symmetric, CN 2-12 intact  Speech is fluent/normal   ASSESSMENT:  Debra Hopkins is a 76 y.o. female who is status post left CEA in September 2008, left SFA PTA with stent 2003. She had a TIA in 1995, none since then. Cramping in left calf after walking about 1/2 block, no change in claudication symptoms in the last few years; denies non-healing wounds, denies rest pain, reports mild right calf claudication. Pt admits to not walking as much due to painful arthritis in her knees and feet at times, lack of padding in the soles of her feet.  She plans to start daily seated leg exercises with [employer].   Her DM is in good control, she stopped smoking in the 1990's, she takes a daily ASA and a statin, she is obese. She rarely drinks ETOH.   DATA Today's carotid duplex suggests minimal left ICA stenosis no restenosis of the left CEA site. Bilateral vertebral artery flow is antegrade.  Bilateral subclavian artery waveforms are normal.  No significant change sine the last exam on 03-28-15.  ABI's: Right: 0.61 (was 0.53 on 03-28-15), monophasic waveforms; TBI: 0.37 (was 0.26) Left: 0.77 (was 0.82), biphasic PT, monophasic DP; TBI: 0.50 (was 0.49)    Plan:  Daily seated leg exercises as discussed and demonstrated.   Based on today's exam and non-invasive vascular lab results, the patient will follow up in 1 year with Carotid Duplex scan and ABI's.    I discussed in depth with the patient the nature of atherosclerosis, and emphasized the importance of maximal medical management including strict control of blood pressure, blood glucose, and lipid levels, obtaining regular exercise, and cessation of smoking.  The patient is aware that without maximal medical management the underlying atherosclerotic disease process will progress, limiting the benefit of any interventions.  The patient was given information about stroke prevention and what symptoms should prompt the  patient to seek immediate medical care.  The patient was given information about PAD including signs, symptoms, treatment, what symptoms should prompt the patient to seek immediate medical care, and risk reduction measures to take. Thank you for allowing [place] to participate in this patient's care.  Clemon Chambers, RN, MSN, FNP-C [facility] Office: [phone]  Clinic MD: Early 04/02/2016 1:14 PM

## 2016-04-05 NOTE — Addendum Note (Signed)
Addended by: Lianne Cure A on: 04/05/2016 04:45 PM   Modules accepted: Orders

## 2016-04-24 ENCOUNTER — Ambulatory Visit (INDEPENDENT_AMBULATORY_CARE_PROVIDER_SITE_OTHER): Payer: Medicare Other | Admitting: Orthopaedic Surgery

## 2016-04-24 ENCOUNTER — Other Ambulatory Visit: Payer: Self-pay | Admitting: "Endocrinology

## 2016-04-24 VITALS — BP 133/71 | HR 82 | Ht 63.0 in | Wt 200.0 lb

## 2016-04-24 DIAGNOSIS — M79644 Pain in right finger(s): Secondary | ICD-10-CM

## 2016-04-24 NOTE — Progress Notes (Signed)
Office Visit Note   Patient: Debra Hopkins           Date of Birth: 03-Jul-1940           MRN: [id number] Visit Date: 04/24/2016              Requested by: Monico Blitz, MD [place], [place] [id number] PCP: Monico Blitz, MD   Assessment & Plan: Visit Diagnoses: Synovial cyst IP joint right thumb   Plan: No further treatment versus surgical excision. Mrs. Sawdon would prefer to "live with it" for the present   Follow-Up Instructions: No Follow-up on file.   Orders:  No orders of the defined types were placed in this encounter.  No orders of the defined types were placed in this encounter.     Procedures: No procedures performed   Clinical Data: No additional findings.   Subjective: Chief Complaint  Patient presents with  . Right Hand - Pain    cyst    Patient presents with complaints of ganglion cyst of her right hand, states she had it aspirated , and it has now recurred. Complains of very little discomfort, but it bothers her some.   Dr. Alphonzo Cruise has aspirated this cyst in the past and it recurred within 2 weeks. Mrs. Pernick she is another opinion. He is not having any pain and it really does not interfere with any of her activities.  Review of Systems   Objective: Vital Signs: BP 133/71   Pulse 82   Ht 5\' 3"  (1.6 m)   Wt 200 lb (90.7 kg)   BMI 35.43 kg/m   Physical Exam  Ortho Exam synovial cyst at the IP joint right thumb. There is no evidence of infection. There is no present drainage. It's approximately 3 mm in diameter. Across the IP joint. Normal feeling.  Specialty Comments:  No specialty comments available.  Imaging: No results found.   PMFS History: Patient Active Problem List   Diagnosis Date Noted  . Type 2 diabetes mellitus with stage 3 chronic kidney disease, without long-term current use of insulin ([place]) 10/16/2015  . Obesity 10/16/2015  . Anemia 02/21/2015  . GERD (gastroesophageal reflux disease) 02/08/2014  . Cough 02/08/2014   . Dysphagia, unspecified(787.20) 09/27/2013  . Fibromyalgia 09/27/2013  . Hypothyroidism 09/27/2013  . Aftercare following surgery of the circulatory system, [place] 02/25/2012  . Occlusion and stenosis of carotid artery without mention of cerebral infarction 02/19/2011  . CAD 07/31/2009  . Hyperlipidemia 11/22/2008  . Essential hypertension, benign 11/22/2008  . PVD 11/22/2008  . DYSPNEA 11/22/2008   Past Medical History:  Diagnosis Date  . Arthritis   . CAD (coronary artery disease)   . Carotid artery occlusion   . Cataracts, bilateral   . DDD (degenerative disc disease)   . Diabetes mellitus    Type 2  . Diverticulitis   . Dyspnea   . Fibromyalgia   . GERD (gastroesophageal reflux disease)   . Hemorrhoids   . History of shingles   . Impaired hearing   . Macular degeneration   . Other and unspecified hyperlipidemia   . PVD (peripheral vascular disease) ([place])   . TIA (transient ischemic attack)   . Unspecified essential hypertension   . Varicose veins     Family History  Problem Relation Age of Onset  . Stroke Mother   . Hypertension Mother   . Stroke Father   . Hypertension Father   . Diabetes Father   . Coronary artery disease  Father   . Diabetes Sister   . Hypertension Sister   . Lung cancer Brother   . Sinusitis Brother   . Healthy Brother   . Diabetes Son   . Hypertension Son   . Coronary artery disease Other     Past Surgical History:  Procedure Laterality Date  . ANGIOPLASTY     left leg 2003  . APPENDECTOMY    . aspiration of a cyst in the right breast    . BALLOON DILATION N/A 10/01/2013   Procedure: BALLOON DILATION;  Surgeon: Rogene Houston, MD;  [facility];  Service: Endoscopy;  Laterality: N/A;  . BREAST BIOPSY    . BREAST BIOPSY Left   . CARDIAC CATHETERIZATION    . CAROTID ENDARTERECTOMY  12-03-06   left CEA  . CATARACT EXTRACTION W/PHACO Right 10/14/2013   Procedure: CATARACT EXTRACTION PHACO AND INTRAOCULAR LENS PLACEMENT  (IOC);  Surgeon: Tonny Branch, MD;  Location: [facility];  Service: Ophthalmology;  Laterality: Right;  CDE 7.20  . CATARACT EXTRACTION W/PHACO Left 11/04/2013   Procedure: CATARACT EXTRACTION PHACO AND INTRAOCULAR LENS PLACEMENT (IOC);  Surgeon: Tonny Branch, MD;  Location: [facility];  Service: Ophthalmology;  Laterality: Left;  CDE 8.57  . COLONOSCOPY N/A 09/19/2014   Procedure: COLONOSCOPY;  Surgeon: Rogene Houston, MD;  [facility];  Service: Endoscopy;  Laterality: N/A;  7:30-8:30  . CYSTO WITH HYDRODISTENSION N/A 03/29/2016   Procedure: CYSTOSCOPY/HYDRODISTENSION;  Surgeon: Irine Seal, MD;  Location: [facility];  Service: Urology;  Laterality: N/A;  . CYSTOSCOPY WITH INJECTION N/A 03/29/2016   Procedure: CYSTOSCOPY WITH PYRIDIUM AND MARCAINE INSTALLATION;  Surgeon: Irine Seal, MD;  Location: [facility];  Service: Urology;  Laterality: N/A;  . ESOPHAGOGASTRODUODENOSCOPY N/A 10/01/2013   Procedure: ESOPHAGOGASTRODUODENOSCOPY (EGD);  Surgeon: Rogene Houston, MD;  [facility];  Service: Endoscopy;  Laterality: N/A;  120  . ESOPHAGOGASTRODUODENOSCOPY N/A 10/24/2014   Procedure: ESOPHAGOGASTRODUODENOSCOPY (EGD);  Surgeon: Rogene Houston, MD;  [facility];  Service: Endoscopy;  Laterality: N/A;  730  . GIVENS CAPSULE STUDY N/A 10/24/2014   Procedure: GIVENS CAPSULE STUDY;  Surgeon: Rogene Houston, MD;  [facility];  Service: Endoscopy;  Laterality: N/A;  . JOINT REPLACEMENT  2011   Right total knee replacement  . [place]  . KNEE ARTHROSCOPY  2009  . MALONEY DILATION N/A 10/01/2013   Procedure: Venia Minks DILATION;  Surgeon: Rogene Houston, MD;  [facility];  Service: Endoscopy;  Laterality: N/A;  . SAVORY DILATION N/A 10/01/2013   Procedure: SAVORY DILATION;  Surgeon: Rogene Houston, MD;  [facility];  Service: Endoscopy;  Laterality: N/A;  . SPINE SURGERY  1997   C5-c6 fusion  . TOTAL ABDOMINAL HYSTERECTOMY  1973   Social  History   Occupational History  . Not on file.   Social History Main Topics  . Smoking status: Former Smoker    Types: Cigarettes    Quit date: 03/11/1985  . Smokeless tobacco: Never Used  . Alcohol use No  . Drug use: No  . Sexual activity: Not on file

## 2016-04-26 ENCOUNTER — Ambulatory Visit (INDEPENDENT_AMBULATORY_CARE_PROVIDER_SITE_OTHER): Payer: Medicare Other | Admitting: Urology

## 2016-04-26 DIAGNOSIS — N3010 Interstitial cystitis (chronic) without hematuria: Secondary | ICD-10-CM

## 2016-04-26 DIAGNOSIS — R351 Nocturia: Secondary | ICD-10-CM | POA: Diagnosis not present

## 2016-04-26 DIAGNOSIS — R3915 Urgency of urination: Secondary | ICD-10-CM

## 2016-05-21 LAB — CMP14+EGFR
A/G RATIO: 2 (ref 1.2–2.2)
ALT: 21 IU/L (ref 0–32)
AST: 15 IU/L (ref 0–40)
Albumin: 4.4 g/dL (ref 3.5–4.8)
Alkaline Phosphatase: 87 IU/L (ref 39–117)
BUN/Creatinine Ratio: 30 — ABNORMAL HIGH (ref 12–28)
BUN: 35 mg/dL — AB (ref 8–27)
Bilirubin Total: 0.4 mg/dL (ref 0.0–1.2)
CALCIUM: 9.6 mg/dL (ref 8.7–10.3)
CO2: 24 mmol/L (ref 18–29)
Chloride: 96 mmol/L (ref 96–106)
Creatinine, Ser: 1.17 mg/dL — ABNORMAL HIGH (ref 0.57–1.00)
GFR calc Af Amer: 52 mL/min/{1.73_m2} — ABNORMAL LOW (ref >59)
GFR, EST NON AFRICAN AMERICAN: 45 mL/min/{1.73_m2} — AB (ref >59)
GLUCOSE: 198 mg/dL — AB (ref 65–99)
Globulin, Total: 2.2 g/dL (ref 1.5–4.5)
POTASSIUM: 5.2 mmol/L (ref 3.5–5.2)
Sodium: 137 mmol/L (ref 134–144)
TOTAL PROTEIN: 6.6 g/dL (ref 6.0–8.5)

## 2016-05-21 LAB — HEMOGLOBIN A1C
Est. average glucose Bld gHb Est-mCnc: 166 mg/dL
Hgb A1c MFr Bld: 7.4 % — ABNORMAL HIGH (ref 4.8–5.6)

## 2016-05-28 ENCOUNTER — Encounter: Payer: Self-pay | Admitting: "Endocrinology

## 2016-05-28 ENCOUNTER — Ambulatory Visit (INDEPENDENT_AMBULATORY_CARE_PROVIDER_SITE_OTHER): Payer: Medicare Other | Admitting: "Endocrinology

## 2016-05-28 ENCOUNTER — Encounter: Payer: Medicare Other | Attending: Nurse Practitioner | Admitting: Nutrition

## 2016-05-28 VITALS — BP 138/72 | HR 71 | Ht 63.0 in | Wt 202.0 lb

## 2016-05-28 VITALS — Ht 63.0 in | Wt 202.0 lb

## 2016-05-28 DIAGNOSIS — Z794 Long term (current) use of insulin: Secondary | ICD-10-CM

## 2016-05-28 DIAGNOSIS — E669 Obesity, unspecified: Secondary | ICD-10-CM

## 2016-05-28 DIAGNOSIS — E782 Mixed hyperlipidemia: Secondary | ICD-10-CM | POA: Diagnosis not present

## 2016-05-28 DIAGNOSIS — I10 Essential (primary) hypertension: Secondary | ICD-10-CM

## 2016-05-28 DIAGNOSIS — E1122 Type 2 diabetes mellitus with diabetic chronic kidney disease: Secondary | ICD-10-CM

## 2016-05-28 DIAGNOSIS — IMO0002 Reserved for concepts with insufficient information to code with codable children: Secondary | ICD-10-CM

## 2016-05-28 DIAGNOSIS — N183 Chronic kidney disease, stage 3 (moderate): Secondary | ICD-10-CM | POA: Insufficient documentation

## 2016-05-28 DIAGNOSIS — E118 Type 2 diabetes mellitus with unspecified complications: Secondary | ICD-10-CM

## 2016-05-28 DIAGNOSIS — E1165 Type 2 diabetes mellitus with hyperglycemia: Secondary | ICD-10-CM

## 2016-05-28 DIAGNOSIS — Z713 Dietary counseling and surveillance: Secondary | ICD-10-CM | POA: Diagnosis present

## 2016-05-28 NOTE — Progress Notes (Signed)
  Medical Nutrition Therapy:  Appt start time: 1330 end time:  1430.  Assessment:  Primary concerns today: Diabetes Type 2. A1C up to 7.4% from 7%. 30 units Lantus daily. Has been trying to buy more fresh vegetables and fruits.  Drinking more water. Not able to exercise right now due to cyst behind her left knee.  She is working on increasing the fiber in diet.  Lab Results  Component Value Date   HGBA1C 7.4 (H) 05/20/2016   Lipid Panel     Component Value Date/Time   CHOL 146 08/04/2015   TRIG 199 (A) 08/04/2015   HDL 37 08/04/2015   LDLCALC 69 08/04/2015     CMP Latest Ref Rng & Units 05/20/2016 03/29/2016 02/19/2016  Glucose 65 - 99 mg/dL 198(H) 159(H) 167(H)  BUN 8 - 27 mg/dL 35(H) 36(H) 40(H)  Creatinine 0.57 - 1.00 mg/dL 1.17(H) 1.14(H) 1.29(H)  Sodium 134 - 144 mmol/L 137 136 140  Potassium 3.5 - 5.2 mmol/L 5.2 4.4 5.0  Chloride 96 - 106 mmol/L 96 102 98  CO2 18 - 29 mmol/L 24 21(L) 21  Calcium 8.7 - 10.3 mg/dL 9.6 9.5 9.4  Total Protein 6.0 - 8.5 g/dL 6.6 - 6.5  Total Bilirubin 0.0 - 1.2 mg/dL 0.4 - 0.3  Alkaline Phos 39 - 117 IU/L 87 - 84  AST 0 - 40 IU/L 15 - 17  ALT 0 - 32 IU/L 21 - 16     Preferred Learning Style:    No preference indicated   Learning Readiness:   Ready  Change in progress   MEDICATIONS:   DIETARY INTAKE:     24-hr recall:  B ( AM): Special K protein with milk or cheerios, banana,  Snk ( AM): none  L ( PM):   Snk ( PM): none D ( PM):  Zuccini and pasta 1 cup, and cheese,  Beverages: water,   Usual physical activity:  ADL.  Estimated energy needs: 1200-1500 calories 135 g carbohydrates 90 g protein 33 g fat  Progress Towards Goal(s):  In progress.   Nutritional Diagnosis:  NB-1.1 Food and nutrition-related knowledge deficit As related to Diabetes.  As evidenced by 8.4%..    Intervention: Nutrition and Diabetes education provided on My Plate, CHO counting, meal planning, portion sizes, timing of meals, avoiding snacks  between meals unless having a low blood sugar, target ranges for A1C and blood sugars, signs/symptoms and treatment of hyper/hypoglycemia, monitoring blood sugars, taking medications as prescribed, benefits of exercising 30 minutes per day and prevention of complications of DM.   Goals 1. Increase spinach or kale for more vitamin and iron 2. Consider bran flakes and add 1-2 tbsp raisinf for a higher fiber breakfast 3. 15 minutes of chair exercise daily. 4. Keep A1C less than 7.4%.  Teaching Method Utilized: Visual Auditory Hands on  Handouts given during visit include:  The Plate Method  Meal Plan Card   Barriers to learning/adherence to lifestyle change: none  Demonstrated degree of understanding via:  Teach Back   Monitoring/Evaluation:  Dietary intake, exercise, meal planning, SBG, and body weight in 3 month(s).

## 2016-05-28 NOTE — Patient Instructions (Signed)

## 2016-05-28 NOTE — Patient Instructions (Signed)
Goals 1. Increase spinach or kale for more vitamin and iron 2. Consider bran flakes and add 1-2 tbsp raisinf for a higher fiber breakfast 3. 15 minutes of chair exercise daily. 4. Keep A1C less than 7.4%.

## 2016-05-28 NOTE — Progress Notes (Signed)
Subjective:    Patient ID: Debra Hopkins, female    DOB: [birth year]. Patient is being seen in f/u for management of diabetes requested by  [facility], MD  Past Medical History:  Diagnosis Date  . Arthritis   . CAD (coronary artery disease)   . Carotid artery occlusion   . Cataracts, bilateral   . DDD (degenerative disc disease)   . Diabetes mellitus    Type 2  . Diverticulitis   . Dyspnea   . Fibromyalgia   . GERD (gastroesophageal reflux disease)   . Hemorrhoids   . History of shingles   . Impaired hearing   . Macular degeneration   . Other and unspecified hyperlipidemia   . PVD (peripheral vascular disease) ([place])   . TIA (transient ischemic attack)   . Unspecified essential hypertension   . Varicose veins    Past Surgical History:  Procedure Laterality Date  . ANGIOPLASTY     left leg 2003  . APPENDECTOMY    . aspiration of a cyst in the right breast    . BALLOON DILATION N/A 10/01/2013   Procedure: BALLOON DILATION;  Surgeon: Rogene Houston, MD;  Location[facility];  Service: Endoscopy;  Laterality: N/A;  . BREAST BIOPSY    . BREAST BIOPSY Left   . CARDIAC CATHETERIZATION    . CAROTID ENDARTERECTOMY  12-03-06   left CEA  . CATARACT EXTRACTION W/PHACO Right 10/14/2013   Procedure: CATARACT EXTRACTION PHACO AND INTRAOCULAR LENS PLACEMENT (IOC);  Surgeon: Tonny Branch, MD;  Location: [facility] ORS;  Service: Ophthalmology;  Laterality: Right;  CDE 7.20  . CATARACT EXTRACTION W/PHACO Left 11/04/2013   Procedure: CATARACT EXTRACTION PHACO AND INTRAOCULAR LENS PLACEMENT (IOC);  Surgeon: Tonny Branch, MD;  Location: [facility];  Service: Ophthalmology;  Laterality: Left;  CDE 8.57  . COLONOSCOPY N/A 09/19/2014   Procedure: COLONOSCOPY;  Surgeon: Rogene Houston, MD;  Location: [facility];  Service: Endoscopy;  Laterality: N/A;  7:30-8:30  . CYSTO WITH HYDRODISTENSION N/A 03/29/2016   Procedure: CYSTOSCOPY/HYDRODISTENSION;  Surgeon: Irine Seal, MD;  Location: [facility];  Service: Urology;   Laterality: N/A;  . CYSTOSCOPY WITH INJECTION N/A 03/29/2016   Procedure: CYSTOSCOPY WITH PYRIDIUM AND MARCAINE INSTALLATION;  Surgeon: Irine Seal, MD;  Location: [facility];  Service: Urology;  Laterality: N/A;  . ESOPHAGOGASTRODUODENOSCOPY N/A 10/01/2013   Procedure: ESOPHAGOGASTRODUODENOSCOPY (EGD);  Surgeon: Rogene Houston, MD;  [facility];  Service: Endoscopy;  Laterality: N/A;  120  . ESOPHAGOGASTRODUODENOSCOPY N/A 10/24/2014   Procedure: ESOPHAGOGASTRODUODENOSCOPY (EGD);  Surgeon: Rogene Houston, MD;  [facility];  Service: Endoscopy;  Laterality: N/A;  730  . GIVENS CAPSULE STUDY N/A 10/24/2014   Procedure: GIVENS CAPSULE STUDY;  Surgeon: Rogene Houston, MD;  [facility];  Service: Endoscopy;  Laterality: N/A;  . JOINT REPLACEMENT  2011   Right total knee replacement  . [place]  . KNEE ARTHROSCOPY  2009  . MALONEY DILATION N/A 10/01/2013   Procedure: Venia Minks DILATION;  Surgeon: Rogene Houston, MD;  [facility];  Service: Endoscopy;  Laterality: N/A;  . SAVORY DILATION N/A 10/01/2013   Procedure: SAVORY DILATION;  Surgeon: Rogene Houston, MD;  [facility];  Service: Endoscopy;  Laterality: N/A;  . SPINE SURGERY  1997   C5-c6 fusion  . TOTAL ABDOMINAL HYSTERECTOMY  1973   Social History   Social History  . Marital status: Widowed    Spouse  name: N/A  . Number of children: N/A  . Years of education: N/A   Social History Main Topics  . Smoking status: Former Smoker    Types: Cigarettes    Quit date: 03/11/1985  . Smokeless tobacco: Never Used  . Alcohol use No  . Drug use: No  . Sexual activity: Not Asked   Other Topics Concern  . None   Social History Narrative   Retired. Married.    Outpatient Encounter Prescriptions as of 05/28/2016  Medication Sig  . Al Hyd-Mg Tr-Alg Ac-Sod Bicarb (GAVISCON-2 PO) Take by mouth.  . Alum Hydroxide-Mag Trisilicate (GAVISCON) 88-28.0 MG CHEW Chew 2-3 tablets by  mouth daily as needed (acid reflux).   Marland Kitchen aspirin (ECOTRIN LOW STRENGTH) 81 MG EC tablet Take 81 mg by mouth daily. Swallow whole.  . B Complex-Biotin-FA (SUPER B-COMPLEX PO) Take 1 tablet by mouth daily.   . Calcium Citrate (CALCITRATE PO) Take 1 tablet by mouth daily.   . Carboxymeth-Glycerin-Polysorb (REFRESH OPTIVE ADVANCED) 0.5-1-0.5 % SOLN Apply 1 drop to eye daily as needed (DRY EYES).  . Cholecalciferol (VITAMIN D) 2000 units tablet Take 2,000 Units by mouth daily.  . cloNIDine (CATAPRES) 0.1 MG tablet Take 0.1-0.2 mg by mouth 3 (three) times daily. TAKES 1 TABLET IN THE MORNING AND 2 TABLETS AT BEDTIME  . esomeprazole (NEXIUM) 40 MG capsule Take 40 mg by mouth daily before breakfast.  . fexofenadine (ALLEGRA) 30 MG/5ML suspension Take 10 mg by mouth daily. TAKES CHILDREN'S LIQUID  . hydrALAZINE (APRESOLINE) 25 MG tablet Take 25 mg by mouth daily.  . Insulin Glargine (LANTUS SOLOSTAR) 100 UNIT/ML Solostar Pen Inject 30 Units into the skin daily at 10 pm.  . Insulin Pen Needle (B-D ULTRAFINE III SHORT PEN) 31G X 8 MM MISC 1 each by Does not apply route as directed.  Marland Kitchen ketotifen (ALAWAY) 0.025 % ophthalmic solution Place 1 drop into both eyes daily as needed (ALLERGIES).  Marland Kitchen levothyroxine (SYNTHROID, LEVOTHROID) 50 MCG tablet Take 50 mcg by mouth daily before breakfast.  . losartan (COZAAR) 100 MG tablet Take 100 mg by mouth daily.    . Magnesium 250 MG TABS Take 1 tablet by mouth at bedtime.   . METHYLCELLULOSE, LAXATIVE, PO Take 15 mLs by mouth every Monday, Wednesday, and Friday.  . Misc Natural Products (OSTEO BI-FLEX ADV JOINT SHIELD PO) Take 1 tablet by mouth every other day.   . Multiple Vitamins-Calcium (ONE-A-DAY WOMENS PO) Take by mouth.  . Multiple Vitamins-Minerals (PRESERVISION AREDS 2 PO) Take 2 tablets by mouth daily.  Marland Kitchen NIFEdipine (PROCARDIA-XL/ADALAT-CC/NIFEDICAL-XL) 30 MG 24 hr tablet Take 30 mg by mouth daily.  Marland Kitchen omega-3 acid ethyl esters (LOVAZA) 1 g capsule Take 1 g by  mouth daily.   Marland Kitchen OVER THE COUNTER MEDICATION Take 1-2 tablets by mouth 2 (two) times daily. RAINBOW LIGHT MULTI VITAMIN TAKE 1 AT BREAKFAST AND 2 AT LUNCH  . oxyCODONE-acetaminophen (PERCOCET/ROXICET) 5-325 MG per tablet Take 0.5 tablets by mouth daily as needed for moderate pain or severe pain.   . phenazopyridine (PYRIDIUM) 200 MG tablet Take 1 tablet (200 mg total) by mouth 3 (three) times daily as needed for pain.  . Polyvinyl Alcohol-Povidone (REFRESH OP) Apply to eye.  Marland Kitchen Propylene Glycol (SYSTANE BALANCE OP) Apply 1 drop to eye daily as needed (DRY EYES).  Marland Kitchen simvastatin (ZOCOR) 10 MG tablet Take 10 mg by mouth every other day.  . SUPER B COMPLEX/C PO Take by mouth.  . torsemide (DEMADEX) 20 MG tablet Take  20-40 mg by mouth daily. TAKES 20 MG DAILY IF NOTICES SWELLING CAN TAKE AN ADDITIONAL TABLET IF NEEDED  . traZODone (DESYREL) 100 MG tablet Take 100 mg by mouth at bedtime.   No facility-administered encounter medications on file as of 05/28/2016.    ALLERGIES: Allergies  Allergen Reactions  . Amoxicillin-Pot Clavulanate Hives    Has patient had a PCN reaction causing immediate rash, facial/tongue/throat swelling, SOB or lightheadedness with hypotension: UNKNOWN Has patient had a PCN reaction causing severe rash involving mucus membranes or skin necrosis: UNKNOWN Has patient had a PCN reaction that required hospitalization No Has patient had a PCN reaction occurring within the last 10 years: No If all of the above answers are "NO", then may proceed with Cephalosporin use.   . Ciprofloxacin Nausea Only    Dizziness  . Erythromycin     REACTION: Upset stomach  . Motrin [Ibuprofen]     REACTION: nausea  . Naproxen     REACTION: severe nausea  . Prednisone     REACTION: itchy rash  . Sulfamethoxazole-Trimethoprim Nausea And Vomiting    REACTION: Upset stomach   VACCINATION STATUS:  There is no immunization history on file for this patient.  Diabetes  She presents for her  follow-up diabetic visit. She has type 2 diabetes mellitus. Onset time: She was diagnosed at approximate age of 47 years. Her disease course has been improving. There are no hypoglycemic associated symptoms. Pertinent negatives for hypoglycemia include no confusion, headaches, pallor or seizures. Associated symptoms include fatigue. Pertinent negatives for diabetes include no chest pain, no polydipsia, no polyphagia and no polyuria. There are no hypoglycemic complications. Symptoms are improving. Diabetic complications include nephropathy. Risk factors for coronary artery disease include dyslipidemia, diabetes mellitus, hypertension, obesity, sedentary lifestyle and tobacco exposure (She smoked for 28 years before she quit.). Current diabetic treatment includes oral agent (monotherapy). Her weight is stable. She is following a generally unhealthy diet. When asked about meal planning, she reported none. She has not had a previous visit with a dietitian. She rarely participates in exercise. Her home blood glucose trend is decreasing steadily. Her breakfast blood glucose range is generally 140-180 mg/dl. Her overall blood glucose range is 140-180 mg/dl. An ACE inhibitor/angiotensin II receptor blocker is being taken. Eye exam is current.  Hyperlipidemia  This is a chronic problem. The current episode started more than 1 year ago. The problem is controlled. Recent lipid tests were reviewed and are variable. Exacerbating diseases include diabetes, hypothyroidism and obesity. Pertinent negatives include no chest pain, myalgias or shortness of breath. Current antihyperlipidemic treatment includes statins. Risk factors for coronary artery disease include diabetes mellitus, dyslipidemia, hypertension, family history, obesity and a sedentary lifestyle.  Hypertension  This is a chronic problem. The current episode started more than 1 year ago. The problem is controlled. Pertinent negatives include no chest pain, headaches,  palpitations or shortness of breath. Risk factors for coronary artery disease include diabetes mellitus, dyslipidemia, obesity, sedentary lifestyle and smoking/tobacco exposure. Past treatments include angiotensin blockers. Hypertensive end-organ damage includes kidney disease.    Review of Systems  Constitutional: Positive for fatigue. Negative for unexpected weight change.  HENT: Negative for trouble swallowing and voice change.   Eyes: Negative for visual disturbance.  Respiratory: Negative for cough, shortness of breath and wheezing.   Cardiovascular: Negative for chest pain, palpitations and leg swelling.  Gastrointestinal: Negative for diarrhea, nausea and vomiting.  Endocrine: Negative for cold intolerance, heat intolerance, polydipsia, polyphagia and polyuria.  Genitourinary: Positive  for frequency. Negative for dysuria and flank pain.  Musculoskeletal: Negative for arthralgias and myalgias.  Skin: Negative for color change, pallor, rash and wound.  Neurological: Negative for seizures and headaches.  Psychiatric/Behavioral: Negative for confusion and suicidal ideas.    Objective:    BP 138/72   Pulse 71   Ht 5' 3"  (1.6 m)   Wt 202 lb (91.6 kg)   BMI 35.78 kg/m   Wt Readings from Last 3 Encounters:  05/28/16 202 lb (91.6 kg)  04/24/16 200 lb (90.7 kg)  04/02/16 207 lb (93.9 kg)    Physical Exam  Constitutional: She is oriented to person, place, and time. She appears well-developed.  HENT:  Head: Normocephalic and atraumatic.  Eyes: EOM are normal.  Neck: Normal range of motion. Neck supple. No tracheal deviation present. No thyromegaly present.  Cardiovascular: Normal rate and regular rhythm.   Pulmonary/Chest: Effort normal and breath sounds normal.  Abdominal: Soft. Bowel sounds are normal. There is no tenderness. There is no guarding.  Musculoskeletal: Normal range of motion. She exhibits no edema.  Neurological: She is alert and oriented to person, place, and time.  She has normal reflexes. No cranial nerve deficit. Coordination normal.  Skin: Skin is warm and dry. No rash noted. No erythema. No pallor.  Psychiatric: She has a normal mood and affect. Judgment normal.    CMP     Component Value Date/Time   NA 137 05/20/2016 1113   K 5.2 05/20/2016 1113   CL 96 05/20/2016 1113   CO2 24 05/20/2016 1113   GLUCOSE 198 (H) 05/20/2016 1113   GLUCOSE 159 (H) 03/29/2016 0951   BUN 35 (H) 05/20/2016 1113   CREATININE 1.17 (H) 05/20/2016 1113   CALCIUM 9.6 05/20/2016 1113   PROT 6.6 05/20/2016 1113   ALBUMIN 4.4 05/20/2016 1113   AST 15 05/20/2016 1113   ALT 21 05/20/2016 1113   ALKPHOS 87 05/20/2016 1113   BILITOT 0.4 05/20/2016 1113   GFRNONAA 45 (L) 05/20/2016 1113   GFRAA 52 (L) 05/20/2016 1113   Recent Results (from the past 2160 hour(s))  Basic metabolic panel     Status: Abnormal   Collection Time: 03/29/16  9:51 AM  Result Value Ref Range   Sodium 136 135 - 145 mmol/L   Potassium 4.4 3.5 - 5.1 mmol/L   Chloride 102 101 - 111 mmol/L   CO2 21 (L) 22 - 32 mmol/L   Glucose, Bld 159 (H) 65 - 99 mg/dL   BUN 36 (H) 6 - 20 mg/dL   Creatinine, Ser 1.14 (H) 0.44 - 1.00 mg/dL   Calcium 9.5 8.9 - 10.3 mg/dL   GFR calc non Af Amer 46 (L) >60 mL/min   GFR calc Af Amer 53 (L) >60 mL/min    Comment: (NOTE) The eGFR has been calculated using the CKD EPI equation. This calculation has not been validated in all clinical situations. eGFR's persistently <60 mL/min signify possible Chronic Kidney Disease.    Anion gap 13 5 - 15  CBC     Status: Abnormal   Collection Time: 03/29/16  9:51 AM  Result Value Ref Range   WBC 6.4 4.0 - 10.5 K/uL   RBC 3.43 (L) 3.87 - 5.11 MIL/uL   Hemoglobin 10.9 (L) 12.0 - 15.0 g/dL   HCT 31.6 (L) 36.0 - 46.0 %   MCV 92.1 78.0 - 100.0 fL   MCH 31.8 26.0 - 34.0 pg   MCHC 34.5 30.0 - 36.0 g/dL   RDW  17.1 (H) 11.5 - 15.5 %   Platelets 585 (H) 150 - 400 K/uL  Glucose, capillary     Status: Abnormal   Collection Time:  03/29/16 12:49 PM  Result Value Ref Range   Glucose-Capillary 151 (H) 65 - 99 mg/dL  VAS US CAROTID     Status: None   Collection Time: 04/02/16 12:49 PM  Result Value Ref Range   Right CCA prox sys 103 cm/s   Right CCA prox dias 13 cm/s   Right cca dist sys -80 cm/s   Left CCA prox sys 137 cm/s   Left CCA prox dias 24 cm/s   Left CCA dist sys 103 cm/s   Left CCA dist dias 24 cm/s   Left ICA prox sys -123 cm/s   Left ICA prox dias -17 cm/s   Left ICA dist sys -121 cm/s   Left ICA dist dias -28 cm/s   RIGHT CCA MID DIAS 16.00 cm/s   RIGHT ECA DIAS -19.00 cm/s   RIGHT VERTEBRAL DIAS 15.00 cm/s   LEFT ECA DIAS -7.00 cm/s   LEFT VERTEBRAL DIAS -17.00 cm/s  CMP14+EGFR     Status: Abnormal   Collection Time: 05/20/16 11:13 AM  Result Value Ref Range   Glucose 198 (H) 65 - 99 mg/dL   BUN 35 (H) 8 - 27 mg/dL   Creatinine, Ser 1.17 (H) 0.57 - 1.00 mg/dL   GFR calc non Af Amer 45 (L) >59 mL/min/1.73   GFR calc Af Amer 52 (L) >59 mL/min/1.73   BUN/Creatinine Ratio 30 (H) 12 - 28   Sodium 137 134 - 144 mmol/L   Potassium 5.2 3.5 - 5.2 mmol/L   Chloride 96 96 - 106 mmol/L   CO2 24 18 - 29 mmol/L   Calcium 9.6 8.7 - 10.3 mg/dL   Total Protein 6.6 6.0 - 8.5 g/dL   Albumin 4.4 3.5 - 4.8 g/dL   Globulin, Total 2.2 1.5 - 4.5 g/dL   Albumin/Globulin Ratio 2.0 1.2 - 2.2   Bilirubin Total 0.4 0.0 - 1.2 mg/dL   Alkaline Phosphatase 87 39 - 117 IU/L   AST 15 0 - 40 IU/L   ALT 21 0 - 32 IU/L  Hemoglobin A1c     Status: Abnormal   Collection Time: 05/20/16 11:13 AM  Result Value Ref Range   Hgb A1c MFr Bld 7.4 (H) 4.8 - 5.6 %    Comment:          Pre-diabetes: 5.7 - 6.4          Diabetes: >6.4          Glycemic control for adults with diabetes: <7.0    Est. average glucose Bld gHb Est-mCnc 166 mg/dL      Assessment & Plan:   1. Type 2 diabetes mellitus with stage 3 chronic kidney disease, without long-term current use of insulin ([name])  - Patient has currently uncontrolled  symptomatic type 2 DM since  76 years of age,  with most recent A1c Stable at 7.4%, over all improving from  8.4 %. Recent labs reviewed.   Her diabetes is complicated by chronic kidney disease stage 3 and patient remains at a high risk for more acute and chronic complications of diabetes which include CAD, CVA, CKD, retinopathy, and neuropathy. These are all discussed in detail with the patient.  - I have counseled the patient on diet management and weight loss, by adopting a carbohydrate restricted/protein rich diet.  - Suggestion is made for patient  to avoid simple carbohydrates   from their diet including Cakes , Desserts, Ice Cream,  Soda (  diet and regular) , Sweet Tea , Candies,  Chips, Cookies, Artificial Sweeteners,   and "Sugar-free" Products . This will help patient to have stable blood glucose profile and potentially avoid unintended weight gain.  - I encouraged the patient to switch to  unprocessed or minimally processed complex starch and increased protein intake (animal or plant source), fruits, and vegetables.  - Patient is advised to stick to a routine mealtimes to eat 3 meals  a day and avoid unnecessary snacks ( to snack only to correct hypoglycemia).  - The patient will be scheduled with Jearld Fenton, RDN, CDE for individualized DM education.  - I have approached patient with the following individualized plan to manage diabetes and patient agrees:   - She will  continue to need basal  insulin treatment. - she became comfortable using insulin.  -I will continue Lantus at 30 units daily at bedtime  associated with monitoring of blood glucose twice a day before breakfast and at bedtime.  - Her renal function is improving with GFR of 52, this would open a chance for her to use low-dose metformin or SGLT2 inhibitors during next visit . - She will be  be considered for incretin therapy as appropriate next visit. - Patient specific target  A1c;  LDL, HDL, Triglycerides, and   Waist Circumference were discussed in detail.  2) BP/HTN: controlled. Continue current medications including ACEI/ARB. 3) Lipids/HPL: controlled, continue statins. 4)  Weight/Diet: CDE Consult is in progress  , exercise, and detailed carbohydrates information provided.  5) Chronic Care/Health Maintenance:  -Patient is on ACEI/ARB and Statin medications and encouraged to continue to follow up with Ophthalmology, Podiatrist at least yearly or according to recommendations, and advised to   stay away from smoking. I have recommended yearly flu vaccine and pneumonia vaccination at least every 5 years; moderate intensity exercise for up to 150 minutes weekly; and  sleep for at least 7 hours a day.  - 30 minutes of time was spent on the care of this patient , 50% of which was applied for counseling on diabetes complications and their preventions.  - Patient to bring meter and  blood glucose logs during their next visit.   - I advised patient to maintain close follow up with [facility], MD for primary care needs.  Follow up plan: - Return in about 3 months (around 08/28/2016).  Glade Lloyd, MD Phone: [phone]  Fax: [phone]   05/28/2016, 2:00 PM

## 2016-07-17 ENCOUNTER — Telehealth (INDEPENDENT_AMBULATORY_CARE_PROVIDER_SITE_OTHER): Payer: Self-pay | Admitting: Orthopaedic Surgery

## 2016-07-17 NOTE — Telephone Encounter (Signed)
Patient called stating that Dr. Jeral Fruit office is requesting all ov notes be sent over for patients thumb so they can make patient an appt.  [phone]. Please call patient and advise when records are faxed over so she can make appt.Marland Kitchen

## 2016-07-18 NOTE — Telephone Encounter (Signed)
Sent notes today

## 2016-08-09 ENCOUNTER — Other Ambulatory Visit: Payer: Self-pay

## 2016-09-04 ENCOUNTER — Other Ambulatory Visit: Payer: Self-pay | Admitting: "Endocrinology

## 2016-09-09 ENCOUNTER — Other Ambulatory Visit: Payer: Self-pay | Admitting: "Endocrinology

## 2016-09-10 LAB — CMP14+EGFR
ALT: 17 IU/L (ref 0–32)
AST: 18 IU/L (ref 0–40)
Albumin/Globulin Ratio: 2.5 — ABNORMAL HIGH (ref 1.2–2.2)
Albumin: 4.5 g/dL (ref 3.5–4.8)
Alkaline Phosphatase: 84 IU/L (ref 39–117)
BUN/Creatinine Ratio: 28 (ref 12–28)
BUN: 29 mg/dL — AB (ref 8–27)
Bilirubin Total: 0.5 mg/dL (ref 0.0–1.2)
CALCIUM: 9.5 mg/dL (ref 8.7–10.3)
CO2: 23 mmol/L (ref 20–29)
Chloride: 99 mmol/L (ref 96–106)
Creatinine, Ser: 1.04 mg/dL — ABNORMAL HIGH (ref 0.57–1.00)
GFR, EST AFRICAN AMERICAN: 60 mL/min/{1.73_m2} (ref >59)
GFR, EST NON AFRICAN AMERICAN: 52 mL/min/{1.73_m2} — AB (ref >59)
GLUCOSE: 185 mg/dL — AB (ref 65–99)
Globulin, Total: 1.8 g/dL (ref 1.5–4.5)
Potassium: 5 mmol/L (ref 3.5–5.2)
Sodium: 137 mmol/L (ref 134–144)
TOTAL PROTEIN: 6.3 g/dL (ref 6.0–8.5)

## 2016-09-10 LAB — HGB A1C W/O EAG: Hgb A1c MFr Bld: 7 % — ABNORMAL HIGH (ref 4.8–5.6)

## 2016-09-16 ENCOUNTER — Encounter: Payer: Medicare Other | Attending: Internal Medicine | Admitting: Nutrition

## 2016-09-16 ENCOUNTER — Ambulatory Visit (INDEPENDENT_AMBULATORY_CARE_PROVIDER_SITE_OTHER): Payer: Medicare Other | Admitting: "Endocrinology

## 2016-09-16 ENCOUNTER — Encounter: Payer: Self-pay | Admitting: "Endocrinology

## 2016-09-16 VITALS — BP 129/72 | HR 91 | Ht 63.0 in | Wt 205.0 lb

## 2016-09-16 DIAGNOSIS — I10 Essential (primary) hypertension: Secondary | ICD-10-CM

## 2016-09-16 DIAGNOSIS — E1122 Type 2 diabetes mellitus with diabetic chronic kidney disease: Secondary | ICD-10-CM | POA: Diagnosis not present

## 2016-09-16 DIAGNOSIS — N183 Chronic kidney disease, stage 3 (moderate): Secondary | ICD-10-CM | POA: Diagnosis not present

## 2016-09-16 DIAGNOSIS — D500 Iron deficiency anemia secondary to blood loss (chronic): Secondary | ICD-10-CM | POA: Diagnosis not present

## 2016-09-16 DIAGNOSIS — E782 Mixed hyperlipidemia: Secondary | ICD-10-CM

## 2016-09-16 DIAGNOSIS — E669 Obesity, unspecified: Secondary | ICD-10-CM

## 2016-09-16 DIAGNOSIS — Z713 Dietary counseling and surveillance: Secondary | ICD-10-CM | POA: Diagnosis not present

## 2016-09-16 DIAGNOSIS — E118 Type 2 diabetes mellitus with unspecified complications: Secondary | ICD-10-CM

## 2016-09-16 DIAGNOSIS — E119 Type 2 diabetes mellitus without complications: Secondary | ICD-10-CM | POA: Diagnosis not present

## 2016-09-16 DIAGNOSIS — IMO0002 Reserved for concepts with insufficient information to code with codable children: Secondary | ICD-10-CM

## 2016-09-16 DIAGNOSIS — Z794 Long term (current) use of insulin: Secondary | ICD-10-CM

## 2016-09-16 DIAGNOSIS — E1165 Type 2 diabetes mellitus with hyperglycemia: Secondary | ICD-10-CM

## 2016-09-16 NOTE — Patient Instructions (Addendum)
Goals 1. Increase fresh fruits and vegetables 2. Increaser fiber in diet 3. Keep drinking water 4+ bottles of water Keep A1C down to 7% or less Talk to MD about iron and referral for a doctor who specializes in blood anemia.

## 2016-09-16 NOTE — Progress Notes (Signed)
Subjective:    Patient ID: Debra Hopkins, female    DOB: Feb 20, 1941. Patient is being seen in f/u for management of diabetes requested by  Monico Blitz, MD  Past Medical History:  Diagnosis Date  . Arthritis   . CAD (coronary artery disease)   . Carotid artery occlusion   . Cataracts, bilateral   . DDD (degenerative disc disease)   . Diabetes mellitus    Type 2  . Diverticulitis   . Dyspnea   . Fibromyalgia   . GERD (gastroesophageal reflux disease)   . Hemorrhoids   . History of shingles   . Impaired hearing   . Macular degeneration   . Other and unspecified hyperlipidemia   . PVD (peripheral vascular disease) ([place])   . TIA (transient ischemic attack)   . Unspecified essential hypertension   . Varicose veins    Past Surgical History:  Procedure Laterality Date  . ANGIOPLASTY     left leg 2003  . APPENDECTOMY    . aspiration of a cyst in the right breast    . BALLOON DILATION N/A 10/01/2013   Procedure: BALLOON DILATION;  Surgeon: Rogene Houston, MD;  [facility];  Service: Endoscopy;  Laterality: N/A;  . BREAST BIOPSY    . BREAST BIOPSY Left   . CARDIAC CATHETERIZATION    . CAROTID ENDARTERECTOMY  12-03-06   left CEA  . CATARACT EXTRACTION W/PHACO Right 10/14/2013   Procedure: CATARACT EXTRACTION PHACO AND INTRAOCULAR LENS PLACEMENT (IOC);  Surgeon: Tonny Branch, MD;  Location: [facility];  Service: Ophthalmology;  Laterality: Right;  CDE 7.20  . CATARACT EXTRACTION W/PHACO Left 11/04/2013   Procedure: CATARACT EXTRACTION PHACO AND INTRAOCULAR LENS PLACEMENT (IOC);  Surgeon: Tonny Branch, MD;  Location: [facility];  Service: Ophthalmology;  Laterality: Left;  CDE 8.57  . COLONOSCOPY N/A 09/19/2014   Procedure: COLONOSCOPY;  Surgeon: Rogene Houston, MD;  [facility];  Service: Endoscopy;  Laterality: N/A;  7:30-8:30  . CYSTO WITH HYDRODISTENSION N/A 03/29/2016   Procedure: CYSTOSCOPY/HYDRODISTENSION;  Surgeon: Irine Seal, MD;  Location: [facility];  Service: Urology;   Laterality: N/A;  . CYSTOSCOPY WITH INJECTION N/A 03/29/2016   Procedure: CYSTOSCOPY WITH PYRIDIUM AND MARCAINE INSTALLATION;  Surgeon: Irine Seal, MD;  Location: [facility];  Service: Urology;  Laterality: N/A;  . ESOPHAGOGASTRODUODENOSCOPY N/A 10/01/2013   Procedure: ESOPHAGOGASTRODUODENOSCOPY (EGD);  Surgeon: Rogene Houston, MD;  [facility];  Service: Endoscopy;  Laterality: N/A;  120  . ESOPHAGOGASTRODUODENOSCOPY N/A 10/24/2014   Procedure: ESOPHAGOGASTRODUODENOSCOPY (EGD);  Surgeon: Rogene Houston, MD;  [facility];  Service: Endoscopy;  Laterality: N/A;  730  . GIVENS CAPSULE STUDY N/A 10/24/2014   Procedure: GIVENS CAPSULE STUDY;  Surgeon: Rogene Houston, MD;  [facility];  Service: Endoscopy;  Laterality: N/A;  . JOINT REPLACEMENT  2011   Right total knee replacement  . [place]  . KNEE ARTHROSCOPY  2009  . MALONEY DILATION N/A 10/01/2013   Procedure: Venia Minks DILATION;  Surgeon: Rogene Houston, MD;  [facility];  Service: Endoscopy;  Laterality: N/A;  . SAVORY DILATION N/A 10/01/2013   Procedure: SAVORY DILATION;  Surgeon: Rogene Houston, MD;  [facility];  Service: Endoscopy;  Laterality: N/A;  . SPINE SURGERY  1997   C5-c6 fusion  . TOTAL ABDOMINAL HYSTERECTOMY  1973   Social History   Social History  . Marital status: Widowed  Spouse name: N/A  . Number of children: N/A  . Years of education: N/A   Social History Main Topics  . Smoking status: Former Smoker    Types: Cigarettes    Quit date: 03/11/1985  . Smokeless tobacco: Never Used  . Alcohol use No  . Drug use: No  . Sexual activity: Not Asked   Other Topics Concern  . None   Social History Narrative   Retired. Married.    Outpatient Encounter Prescriptions as of 09/16/2016  Medication Sig  . Al Hyd-Mg Tr-Alg Ac-Sod Bicarb (GAVISCON-2 PO) Take by mouth.  . Alum Hydroxide-Mag Trisilicate (GAVISCON) 16-10.9 MG CHEW Chew 2-3 tablets by  mouth daily as needed (acid reflux).   Marland Kitchen aspirin (ECOTRIN LOW STRENGTH) 81 MG EC tablet Take 81 mg by mouth daily. Swallow whole.  . B Complex-Biotin-FA (SUPER B-COMPLEX PO) Take 1 tablet by mouth daily.   . Calcium Citrate (CALCITRATE PO) Take 1 tablet by mouth daily.   . Carboxymeth-Glycerin-Polysorb (REFRESH OPTIVE ADVANCED) 0.5-1-0.5 % SOLN Apply 1 drop to eye daily as needed (DRY EYES).  . Cholecalciferol (VITAMIN D) 2000 units tablet Take 2,000 Units by mouth daily.  . cloNIDine (CATAPRES) 0.1 MG tablet Take 0.1-0.2 mg by mouth 3 (three) times daily. TAKES 1 TABLET IN THE MORNING AND 2 TABLETS AT BEDTIME  . esomeprazole (NEXIUM) 40 MG capsule Take 20 mg by mouth daily before breakfast.   . fexofenadine (ALLEGRA) 30 MG/5ML suspension Take 10 mg by mouth daily. TAKES CHILDREN'S LIQUID  . hydrALAZINE (APRESOLINE) 25 MG tablet Take 25 mg by mouth daily.  . Insulin Pen Needle (B-D ULTRAFINE III SHORT PEN) 31G X 8 MM MISC 1 each by Does not apply route as directed.  Marland Kitchen ketotifen (ALAWAY) 0.025 % ophthalmic solution Place 1 drop into both eyes daily as needed (ALLERGIES).  Marland Kitchen LANTUS SOLOSTAR 100 UNIT/ML Solostar Pen INJECT 30 UNITS INTO THE SKIN AT BEDTIME  . levothyroxine (SYNTHROID, LEVOTHROID) 50 MCG tablet Take 50 mcg by mouth daily before breakfast.  . losartan (COZAAR) 100 MG tablet Take 100 mg by mouth daily.    . Magnesium 250 MG TABS Take 1 tablet by mouth at bedtime.   . METHYLCELLULOSE, LAXATIVE, PO Take 15 mLs by mouth every Monday, Wednesday, and Friday.  . Misc Natural Products (OSTEO BI-FLEX ADV JOINT SHIELD PO) Take 1 tablet by mouth every other day.   . Multiple Vitamins-Calcium (ONE-A-DAY WOMENS PO) Take by mouth.  . Multiple Vitamins-Minerals (PRESERVISION AREDS 2 PO) Take 2 tablets by mouth daily.  Marland Kitchen NIFEdipine (PROCARDIA-XL/ADALAT-CC/NIFEDICAL-XL) 30 MG 24 hr tablet Take 30 mg by mouth daily.  Marland Kitchen omega-3 acid ethyl esters (LOVAZA) 1 g capsule Take 1 g by mouth daily.   Marland Kitchen OVER  THE COUNTER MEDICATION Take 1-2 tablets by mouth 2 (two) times daily. RAINBOW LIGHT MULTI VITAMIN TAKE 1 AT BREAKFAST AND 2 AT LUNCH  . oxyCODONE-acetaminophen (PERCOCET/ROXICET) 5-325 MG per tablet Take 0.5 tablets by mouth daily as needed for moderate pain or severe pain.   . phenazopyridine (PYRIDIUM) 200 MG tablet Take 1 tablet (200 mg total) by mouth 3 (three) times daily as needed for pain.  . Polyvinyl Alcohol-Povidone (REFRESH OP) Apply to eye.  Marland Kitchen Propylene Glycol (SYSTANE BALANCE OP) Apply 1 drop to eye daily as needed (DRY EYES).  Marland Kitchen simvastatin (ZOCOR) 10 MG tablet Take 10 mg by mouth every other day.  . SUPER B COMPLEX/C PO Take by mouth.  . torsemide (DEMADEX) 20 MG tablet Take 20-40 mg  by mouth daily. TAKES 20 MG DAILY IF NOTICES SWELLING CAN TAKE AN ADDITIONAL TABLET IF NEEDED  . traZODone (DESYREL) 100 MG tablet Take 100 mg by mouth at bedtime.   No facility-administered encounter medications on file as of 09/16/2016.    ALLERGIES: Allergies  Allergen Reactions  . Amoxicillin-Pot Clavulanate Hives    Has patient had a PCN reaction causing immediate rash, facial/tongue/throat swelling, SOB or lightheadedness with hypotension: UNKNOWN Has patient had a PCN reaction causing severe rash involving mucus membranes or skin necrosis: UNKNOWN Has patient had a PCN reaction that required hospitalization No Has patient had a PCN reaction occurring within the last 10 years: No If all of the above answers are "NO", then may proceed with Cephalosporin use.   . Ciprofloxacin Nausea Only    Dizziness  . Erythromycin     REACTION: Upset stomach  . Motrin [Ibuprofen]     REACTION: nausea  . Naproxen     REACTION: severe nausea  . Prednisone     REACTION: itchy rash  . Sulfamethoxazole-Trimethoprim Nausea And Vomiting    REACTION: Upset stomach   VACCINATION STATUS:  There is no immunization history on file for this patient.  Diabetes  She presents for her follow-up diabetic visit.  She has type 2 diabetes mellitus. Onset time: She was diagnosed at approximate age of 38 years. Her disease course has been stable. There are no hypoglycemic associated symptoms. Pertinent negatives for hypoglycemia include no confusion, headaches, pallor or seizures. Associated symptoms include fatigue. Pertinent negatives for diabetes include no chest pain, no polydipsia, no polyphagia and no polyuria. There are no hypoglycemic complications. Symptoms are stable. Diabetic complications include nephropathy. Risk factors for coronary artery disease include dyslipidemia, diabetes mellitus, hypertension, obesity, sedentary lifestyle and tobacco exposure (She smoked for 28 years before she quit.). Current diabetic treatment includes oral agent (monotherapy). Her weight is stable. She is following a generally unhealthy diet. When asked about meal planning, she reported none. She has not had a previous visit with a dietitian. She rarely participates in exercise. Her home blood glucose trend is decreasing steadily. Her breakfast blood glucose range is generally 140-180 mg/dl. Her overall blood glucose range is 140-180 mg/dl. An ACE inhibitor/angiotensin II receptor blocker is being taken. Eye exam is current.  Hyperlipidemia  This is a chronic problem. The current episode started more than 1 year ago. The problem is controlled. Recent lipid tests were reviewed and are variable. Exacerbating diseases include diabetes, hypothyroidism and obesity. Pertinent negatives include no chest pain, myalgias or shortness of breath. Current antihyperlipidemic treatment includes statins. Risk factors for coronary artery disease include diabetes mellitus, dyslipidemia, hypertension, family history, obesity and a sedentary lifestyle.  Hypertension  This is a chronic problem. The current episode started more than 1 year ago. The problem is controlled. Pertinent negatives include no chest pain, headaches, palpitations or shortness of  breath. Risk factors for coronary artery disease include diabetes mellitus, dyslipidemia, obesity, sedentary lifestyle and smoking/tobacco exposure. Past treatments include angiotensin blockers. Hypertensive end-organ damage includes kidney disease.    Review of Systems  Constitutional: Positive for fatigue. Negative for unexpected weight change.  HENT: Negative for trouble swallowing and voice change.   Eyes: Negative for visual disturbance.  Respiratory: Negative for cough, shortness of breath and wheezing.   Cardiovascular: Negative for chest pain, palpitations and leg swelling.  Gastrointestinal: Negative for diarrhea, nausea and vomiting.  Endocrine: Negative for cold intolerance, heat intolerance, polydipsia, polyphagia and polyuria.  Genitourinary: Positive for frequency.  Negative for dysuria and flank pain.  Musculoskeletal: Negative for arthralgias and myalgias.  Skin: Negative for color change, pallor, rash and wound.  Neurological: Negative for seizures and headaches.  Psychiatric/Behavioral: Negative for confusion and suicidal ideas.    Objective:    BP 129/72   Pulse 91   Ht 5' 3"  (1.6 m)   Wt 205 lb (93 kg)   BMI 36.31 kg/m   Wt Readings from Last 3 Encounters:  09/16/16 205 lb (93 kg)  05/28/16 202 lb (91.6 kg)  05/28/16 202 lb (91.6 kg)    Physical Exam  Constitutional: She is oriented to person, place, and time. She appears well-developed.  HENT:  Head: Normocephalic and atraumatic.  Eyes: EOM are normal.  Neck: Normal range of motion. Neck supple. No tracheal deviation present. No thyromegaly present.  Cardiovascular: Normal rate and regular rhythm.   Pulmonary/Chest: Effort normal and breath sounds normal.  Abdominal: Soft. Bowel sounds are normal. There is no tenderness. There is no guarding.  Musculoskeletal: Normal range of motion. She exhibits no edema.  Neurological: She is alert and oriented to person, place, and time. She has normal reflexes. No  cranial nerve deficit. Coordination normal.  Skin: Skin is warm and dry. No rash noted. No erythema. No pallor.  Psychiatric: She has a normal mood and affect. Judgment normal.    CMP     Component Value Date/Time   NA 137 09/09/2016 0919   K 5.0 09/09/2016 0919   CL 99 09/09/2016 0919   CO2 23 09/09/2016 0919   GLUCOSE 185 (H) 09/09/2016 0919   GLUCOSE 159 (H) 03/29/2016 0951   BUN 29 (H) 09/09/2016 0919   CREATININE 1.04 (H) 09/09/2016 0919   CALCIUM 9.5 09/09/2016 0919   PROT 6.3 09/09/2016 0919   ALBUMIN 4.5 09/09/2016 0919   AST 18 09/09/2016 0919   ALT 17 09/09/2016 0919   ALKPHOS 84 09/09/2016 0919   BILITOT 0.5 09/09/2016 0919   GFRNONAA 52 (L) 09/09/2016 0919   GFRAA 60 09/09/2016 0919   Recent Results (from the past 2160 hour(s))  CMP14+EGFR     Status: Abnormal   Collection Time: 09/09/16  9:19 AM  Result Value Ref Range   Glucose 185 (H) 65 - 99 mg/dL   BUN 29 (H) 8 - 27 mg/dL   Creatinine, Ser 1.04 (H) 0.57 - 1.00 mg/dL   GFR calc non Af Amer 52 (L) >59 mL/min/1.73   GFR calc Af Amer 60 >59 mL/min/1.73   BUN/Creatinine Ratio 28 12 - 28   Sodium 137 134 - 144 mmol/L   Potassium 5.0 3.5 - 5.2 mmol/L   Chloride 99 96 - 106 mmol/L   CO2 23 20 - 29 mmol/L    Comment:               **Please note reference interval change**   Calcium 9.5 8.7 - 10.3 mg/dL   Total Protein 6.3 6.0 - 8.5 g/dL   Albumin 4.5 3.5 - 4.8 g/dL   Globulin, Total 1.8 1.5 - 4.5 g/dL   Albumin/Globulin Ratio 2.5 (H) 1.2 - 2.2   Bilirubin Total 0.5 0.0 - 1.2 mg/dL   Alkaline Phosphatase 84 39 - 117 IU/L   AST 18 0 - 40 IU/L   ALT 17 0 - 32 IU/L  Hgb A1c w/o eAG     Status: Abnormal   Collection Time: 09/09/16  9:19 AM  Result Value Ref Range   Hgb A1c MFr Bld 7.0 (H) 4.8 -  5.6 %    Comment:          Pre-diabetes: 5.7 - 6.4          Diabetes: >6.4          Glycemic control for adults with diabetes: <7.0       Assessment & Plan:   1. Type 2 diabetes mellitus with stage 3 chronic  kidney disease, without long-term current use of insulin ([place])  - Patient has currently uncontrolled symptomatic type 2 DM since  76 years of age,  with most recent A1c Stable at 7%, over all improving from  8.4 %. Recent labs reviewed.  - Of note, she has chronic blood loss anemia with hemoglobin at 10.8 which may underestimate her glycemic burden.   Her diabetes is complicated by chronic kidney disease stage 3 and patient remains at a high risk for more acute and chronic complications of diabetes which include CAD, CVA, CKD, retinopathy, and neuropathy. These are all discussed in detail with the patient.  - I have counseled the patient on diet management and weight loss, by adopting a carbohydrate restricted/protein rich diet.  - Suggestion is made for patient to avoid simple carbohydrates   from her diet including Cakes , Desserts, Ice Cream,  Soda (  diet and regular) , Sweet Tea , Candies,  Chips, Cookies, Artificial Sweeteners,   and "Sugar-free" Products . This will help patient to have stable blood glucose profile and potentially avoid unintended weight gain.  - I encouraged the patient to switch to  unprocessed or minimally processed complex starch and increased protein intake (animal or plant source), fruits, and vegetables.  - Patient is advised to stick to a routine mealtimes to eat 3 meals  a day and avoid unnecessary snacks ( to snack only to correct hypoglycemia).  - The patient will be scheduled with Jearld Fenton, RDN, CDE for individualized DM education.  - I have approached patient with the following individualized plan to manage diabetes and patient agrees:   - She will  continue to need basal  insulin treatment. - she became comfortable using insulin.  -I will continue Lantus at 30 units daily at bedtime  associated with monitoring of blood glucose twice a day before breakfast and at bedtime.  - Her renal function is improving with GFR of 52, this would open a chance for  her to use low-dose metformin or SGLT2 inhibitors during next visit . - She  is advised to continue follow-up and pursue GI workup for chronic blood loss. - Patient specific target  A1c;  LDL, HDL, Triglycerides, and  Waist Circumference were discussed in detail.  2) BP/HTN: controlled. Continue current medications including ACEI/ARB. 3) Lipids/HPL: controlled, continue statins. 4)  Weight/Diet: CDE Consult is in progress  , exercise, and detailed carbohydrates information provided.  5) Chronic Care/Health Maintenance:  -Patient is on ACEI/ARB and Statin medications and encouraged to continue to follow up with Ophthalmology, Podiatrist at least yearly or according to recommendations, and advised to   stay away from smoking. I have recommended yearly flu vaccine and pneumonia vaccination at least every 5 years; moderate intensity exercise for up to 150 minutes weekly; and  sleep for at least 7 hours a day.  - 30 minutes of time was spent on the care of this patient , 50% of which was applied for counseling on diabetes complications and their preventions.  - Patient to bring meter and  blood glucose logs during her next visit.   -  I advised patient to maintain close follow up with Monico Blitz, MD for primary care needs.  Follow up plan: - Return in about 3 months (around 12/17/2016) for meter, and logs.  Glade Lloyd, MD Phone: [phone]  Fax: [phone]   09/16/2016, 1:55 PM

## 2016-09-16 NOTE — Progress Notes (Signed)
Medical Nutrition Therapy:  Appt start time: 1400 end time:  1430.  Assessment:  Primary concerns today: Diabetes Type 2. A1C  7%. BS was 222 mg/dl, Changes: hasn't been able to exercise due to knee problems. Had thumb surgery recently. On B12. She notes she is anemic and has no energy.  30 units Lantus daily. Has been trying to buy more fresh vegetables and fruits.  Drinking more water.  Doesn't have any energy to exercise. Feels like she may be depressed. Had been on Prozac in past. Complains of bruising easily. Willing to try taking iron pills again to see if it helps.  She is working on increasing the fiber in diet.   Diet continues to be inconsistent in CHO to improve blood sugars consistently. Lantus 30 units a day.Gained 3 lbs from lack of activity. She reports she had a knee replacement since the arthroscopy didn't work.   Gave her sample of nano needles to help reduce bruising from pen needles. She is engaged and wanting to make better food choices to improve blood sugars.  Lab Results  Component Value Date   HGBA1C 7.0 (H) 09/09/2016   Lipid Panel     Component Value Date/Time   CHOL 146 08/04/2015   TRIG 199 (A) 08/04/2015   HDL 37 08/04/2015   LDLCALC 69 08/04/2015     CMP Latest Ref Rng & Units 09/09/2016 05/20/2016 03/29/2016  Glucose 65 - 99 mg/dL 185(H) 198(H) 159(H)  BUN 8 - 27 mg/dL 29(H) 35(H) 36(H)  Creatinine 0.57 - 1.00 mg/dL 1.04(H) 1.17(H) 1.14(H)  Sodium 134 - 144 mmol/L 137 137 136  Potassium 3.5 - 5.2 mmol/L 5.0 5.2 4.4  Chloride 96 - 106 mmol/L 99 96 102  CO2 20 - 29 mmol/L 23 24 21(L)  Calcium 8.7 - 10.3 mg/dL 9.5 9.6 9.5  Total Protein 6.0 - 8.5 g/dL 6.3 6.6 -  Total Bilirubin 0.0 - 1.2 mg/dL 0.5 0.4 -  Alkaline Phos 39 - 117 IU/L 84 87 -  AST 0 - 40 IU/L 18 15 -  ALT 0 - 32 IU/L 17 21 -     Preferred Learning Style:    No preference indicated   Learning Readiness:   Ready  Change in progress   MEDICATIONS:   DIETARY INTAKE:      24-hr recall:  B ( AM): Special K protein with milk with raisins or banana. Snk ( AM): none  L ( PM):  Protein shake, and yogurt, water Snk ( PM): none D ( PM):  BBQ ribs, potato salad, baked beans, roll, water    Beverages: water,   Usual physical activity:  ADL.  Estimated energy needs: 1200-1500 calories 135 g carbohydrates 90 g protein 33 g fat  Progress Towards Goal(s):  In progress.   Nutritional Diagnosis:  NB-1.1 Food and nutrition-related knowledge deficit As related to Diabetes.  As evidenced by 8.4%..    Intervention: Nutrition and Diabetes education provided on My Plate, CHO counting, meal planning, portion sizes, timing of meals, avoiding snacks between meals unless having a low blood sugar, target ranges for A1C and blood sugars, signs/symptoms and treatment of hyper/hypoglycemia, monitoring blood sugars, taking medications as prescribed, benefits of exercising 30 minutes per day and prevention of complications of DM.   Goals 1. Increase fresh fruits and vegetables 2. Increaser fiber in diet 3. Keep drinking water 4+ bottles of water Keep A1C down to 7% or less Talk to MD about iron and referral for a doctor who specializes  in blood anemia.   Teaching Method Utilized: Visual Auditory Hands on  Handouts given during visit include:  The Plate Method  Meal Plan Card   Barriers to learning/adherence to lifestyle change: none  Demonstrated degree of understanding via:  Teach Back   Monitoring/Evaluation:  Dietary intake, exercise, meal planning, SBG, and body weight in 3 month(s).

## 2016-09-16 NOTE — Patient Instructions (Signed)

## 2016-09-17 ENCOUNTER — Other Ambulatory Visit: Payer: Self-pay | Admitting: "Endocrinology

## 2016-09-17 DIAGNOSIS — E118 Type 2 diabetes mellitus with unspecified complications: Principal | ICD-10-CM

## 2016-09-17 DIAGNOSIS — E1165 Type 2 diabetes mellitus with hyperglycemia: Secondary | ICD-10-CM

## 2016-10-14 ENCOUNTER — Other Ambulatory Visit: Payer: Self-pay | Admitting: "Endocrinology

## 2016-10-25 ENCOUNTER — Ambulatory Visit (INDEPENDENT_AMBULATORY_CARE_PROVIDER_SITE_OTHER): Payer: Medicare Other | Admitting: Urology

## 2016-10-25 DIAGNOSIS — R3915 Urgency of urination: Secondary | ICD-10-CM

## 2016-12-06 ENCOUNTER — Ambulatory Visit: Payer: Self-pay | Admitting: Orthopedic Surgery

## 2016-12-10 LAB — BASIC METABOLIC PANEL
BUN: 46 — AB (ref 4–21)
CREATININE: 1 (ref 0.5–1.1)

## 2016-12-10 LAB — HEMOGLOBIN A1C: HEMOGLOBIN A1C: 7.4

## 2016-12-17 ENCOUNTER — Ambulatory Visit (INDEPENDENT_AMBULATORY_CARE_PROVIDER_SITE_OTHER): Payer: Medicare Other | Admitting: "Endocrinology

## 2016-12-17 ENCOUNTER — Ambulatory Visit: Payer: Medicare Other | Admitting: Nutrition

## 2016-12-17 ENCOUNTER — Encounter: Payer: Self-pay | Admitting: "Endocrinology

## 2016-12-17 VITALS — BP 138/75 | HR 89 | Ht 63.0 in | Wt 204.0 lb

## 2016-12-17 DIAGNOSIS — E1122 Type 2 diabetes mellitus with diabetic chronic kidney disease: Secondary | ICD-10-CM | POA: Diagnosis not present

## 2016-12-17 DIAGNOSIS — I10 Essential (primary) hypertension: Secondary | ICD-10-CM

## 2016-12-17 DIAGNOSIS — N183 Chronic kidney disease, stage 3 (moderate): Secondary | ICD-10-CM | POA: Diagnosis not present

## 2016-12-17 DIAGNOSIS — E038 Other specified hypothyroidism: Secondary | ICD-10-CM | POA: Diagnosis not present

## 2016-12-17 DIAGNOSIS — E782 Mixed hyperlipidemia: Secondary | ICD-10-CM | POA: Diagnosis not present

## 2016-12-17 MED ORDER — INSULIN GLARGINE 100 UNIT/ML SOLOSTAR PEN: PEN_INJECTOR | SUBCUTANEOUS | 2 refills | Status: DC

## 2016-12-17 NOTE — Patient Instructions (Addendum)
Debra Hopkins  12/17/2016   Your procedure is scheduled on: 12-23-16   Report to [facility] Main  Entrance Follow signs to Short Stay on first floor at 5:50 AM    Call this number if you have problems the morning of surgery [phone]   Remember: ONLY 1 PERSON MAY GO WITH YOU TO SHORT STAY TO GET  READY MORNING OF [name].  Do not eat food or drink liquids :After Midnight.     Take these medicines the morning of surgery with A SIP OF WATER: Esomeprazole (Nexium), Levothyroxine (Synthroid), Clonidine (Catapres) DO NOT TAKE ANY DIABETIC MEDICATIONS DAY OF YOUR SURGERY                               You may not have any metal on your body including hair pins and              piercings  Do not wear jewelry, make-up, lotions, powders or perfumes, deodorant             Do not wear nail polish.  Do not shave  48 hours prior to surgery.                 Do not bring valuables to the hospital. [name].  Contacts, dentures or bridgework may not be worn into surgery.  Leave suitcase in the car. After surgery it may be brought to your room.               Please read over the following fact sheets you were given: _____________________________________________________________________ How to Manage Your Diabetes Before and After Surgery  Why is it important to control my blood sugar before and after surgery? . Improving blood sugar levels before and after surgery helps healing and can limit problems. . A way of improving blood sugar control is eating a healthy diet by: o  Eating less sugar and carbohydrates o  Increasing activity/exercise o  Talking with your doctor about reaching your blood sugar goals . High blood sugars (greater than 180 mg/dL) can raise your risk of infections and slow your recovery, so you will need to focus on controlling your diabetes during the weeks before surgery. . Make sure that the  doctor who takes care of your diabetes knows about your planned surgery including the date and location.  How do I manage my blood sugar before surgery? . Check your blood sugar at least 4 times a day, starting 2 days before surgery, to make sure that the level is not too high or low. o Check your blood sugar the morning of your surgery when you wake up and every 2 hours until you get to the Short Stay unit. . If your blood sugar is less than 70 mg/dL, you will need to treat for low blood sugar: o Do not take insulin. o Treat a low blood sugar (less than 70 mg/dL) with  cup of clear juice (cranberry or apple), 4 glucose tablets, OR glucose gel. o Recheck blood sugar in 15 minutes after treatment (to make sure it is greater than 70 mg/dL). If your blood sugar is not greater than 70 mg/dL on recheck, call [phone] for further instructions. . Report your blood  sugar to the short stay nurse when you get to Short Stay.  . If you are admitted to the hospital after surgery: o Your blood sugar will be checked by the staff and you will probably be given insulin after surgery (instead of oral diabetes medicines) to make sure you have good blood sugar levels. o The goal for blood sugar control after surgery is 80-180 mg/dL.   WHAT DO I DO ABOUT MY DIABETES MEDICATION?  Marland Kitchen Do not take oral diabetes medicines (pills) the morning of surgery.  . THE NIGHT BEFORE SURGERY, take 17  units of Lantus insulin.       . The day of surgery, do not take other diabetes injectables, including Byetta (exenatide), Bydureon (exenatide ER), Victoza (liraglutide), or Trulicity (dulaglutide).   Patient Signature:  Date:   Nurse Signature:  Date:   Reviewed and Endorsed by [facility] Patient Education Committee, August 2015            [facility] - Preparing for Surgery Before surgery, you can play an important role.  Because skin is not sterile, your skin needs to be as free of germs as possible.  You can  reduce the number of germs on your skin by washing with CHG (chlorahexidine gluconate) soap before surgery.  CHG is an antiseptic cleaner which kills germs and bonds with the skin to continue killing germs even after washing. Please DO NOT use if you have an allergy to CHG or antibacterial soaps.  If your skin becomes reddened/irritated stop using the CHG and inform your nurse when you arrive at Short Stay. Do not shave (including legs and underarms) for at least 48 hours prior to the first CHG shower.  You may shave your face/neck. Please follow these instructions carefully:  1.  Shower with CHG Soap the night before surgery and the  morning of Surgery.  2.  If you choose to wash your hair, wash your hair first as usual with your  normal  shampoo.  3.  After you shampoo, rinse your hair and body thoroughly to remove the  shampoo.                           4.  Use CHG as you would any other liquid soap.  You can apply chg directly  to the skin and wash                       Gently with a scrungie or clean washcloth.  5.  Apply the CHG Soap to your body ONLY FROM THE NECK DOWN.   Do not use on face/ open                           Wound or open sores. Avoid contact with eyes, ears mouth and genitals (private parts).                       Wash face,  Genitals (private parts) with your normal soap.             6.  Wash thoroughly, paying special attention to the area where your surgery  will be performed.  7.  Thoroughly rinse your body with warm water from the neck down.  8.  DO NOT shower/wash with your normal soap after using and rinsing off  the CHG Soap.  9.  Pat yourself dry with a clean towel.            10.  Wear clean pajamas.            11.  Place clean sheets on your bed the night of your first shower and do not  sleep with pets. Day of Surgery : Do not apply any lotions/deodorants the morning of surgery.  Please wear clean clothes to the hospital/surgery center.  FAILURE TO  FOLLOW THESE INSTRUCTIONS MAY RESULT IN THE CANCELLATION OF YOUR SURGERY PATIENT SIGNATURE_________________________________  NURSE SIGNATURE__________________________________  ________________________________________________________________________   Adam Phenix  An incentive spirometer is a tool that can help keep your lungs clear and active. This tool measures how well you are filling your lungs with each breath. Taking long deep breaths may help reverse or decrease the chance of developing breathing (pulmonary) problems (especially infection) following:  A long period of time when you are unable to move or be active. BEFORE THE PROCEDURE   If the spirometer includes an indicator to Hopkins your best effort, your nurse or respiratory therapist will set it to a desired goal.  If possible, sit up straight or lean slightly forward. Try not to slouch.  Hold the incentive spirometer in an upright position. INSTRUCTIONS FOR USE  1. Sit on the edge of your bed if possible, or sit up as far as you can in bed or on a chair. 2. Hold the incentive spirometer in an upright position. 3. Breathe out normally. 4. Place the mouthpiece in your mouth and seal your lips tightly around it. 5. Breathe in slowly and as deeply as possible, raising the piston or the ball toward the top of the column. 6. Hold your breath for 3-5 seconds or for as long as possible. Allow the piston or ball to fall to the bottom of the column. 7. Remove the mouthpiece from your mouth and breathe out normally. 8. Rest for a few seconds and repeat Steps 1 through 7 at least 10 times every 1-2 hours when you are awake. Take your time and take a few normal breaths between deep breaths. 9. The spirometer may include an indicator to Hopkins your best effort. Use the indicator as a goal to work toward during each repetition. 10. After each set of 10 deep breaths, practice coughing to be sure your lungs are clear. If you have an  incision (the cut made at the time of surgery), support your incision when coughing by placing a pillow or rolled up towels firmly against it. Once you are able to get out of bed, walk around indoors and cough well. You may stop using the incentive spirometer when instructed by your caregiver.  RISKS AND COMPLICATIONS  Take your time so you do not get dizzy or light-headed.  If you are in pain, you may need to take or ask for pain medication before doing incentive spirometry. It is harder to take a deep breath if you are having pain. AFTER USE  Rest and breathe slowly and easily.  It can be helpful to keep track of a log of your progress. Your caregiver can provide you with a simple table to help with this. If you are using the spirometer at home, follow these instructions: [place] IF:   You are having difficultly using the spirometer.  You have trouble using the spirometer as often as instructed.  Your pain medication is not giving enough relief while using the spirometer.  You  develop fever of 100.5 F (38.1 C) or higher. SEEK IMMEDIATE MEDICAL CARE IF:   You cough up bloody sputum that had not been present before.  You develop fever of 102 F (38.9 C) or greater.  You develop worsening pain at or near the incision site. MAKE SURE YOU:   Understand these instructions.  Will watch your condition.  Will get help right away if you are not doing well or get worse. Document Released: 07/08/2006 Document Revised: 05/20/2011 Document Reviewed: 09/08/2006 [employer] Patient Information 2014 [employer], [place].   ________________________________________________________________________  WHAT IS A BLOOD TRANSFUSION? Blood Transfusion Information  A transfusion is the replacement of blood or some of its parts. Blood is made up of multiple cells which provide different functions.  Red blood cells carry oxygen and are used for blood loss replacement.  White blood cells  fight against infection.  Platelets control bleeding.  Plasma helps clot blood.  Other blood products are available for specialized needs, such as hemophilia or other clotting disorders. BEFORE THE TRANSFUSION  Who gives blood for transfusions?   Healthy volunteers who are fully evaluated to make sure their blood is safe. This is blood bank blood. Transfusion therapy is the safest it has ever been in the practice of medicine. Before blood is taken from a donor, a complete history is taken to make sure that person has no history of diseases nor engages in risky social behavior (examples are intravenous drug use or sexual activity with multiple partners). The donor's travel history is screened to minimize risk of transmitting infections, such as malaria. The donated blood is tested for signs of infectious diseases, such as HIV and hepatitis. The blood is then tested to be sure it is compatible with you in order to minimize the chance of a transfusion reaction. If you or a relative donates blood, this is often done in anticipation of surgery and is not appropriate for emergency situations. It takes many days to process the donated blood. RISKS AND COMPLICATIONS Although transfusion therapy is very safe and saves many lives, the main dangers of transfusion include:   Getting an infectious disease.  Developing a transfusion reaction. This is an allergic reaction to something in the blood you were given. Every precaution is taken to prevent this. The decision to have a blood transfusion has been considered carefully by your caregiver before blood is given. Blood is not given unless the benefits outweigh the risks. AFTER THE TRANSFUSION  Right after receiving a blood transfusion, you will usually feel much better and more energetic. This is especially true if your red blood cells have gotten low (anemic). The transfusion raises the level of the red blood cells which carry oxygen, and this usually  causes an energy increase.  The nurse administering the transfusion will monitor you carefully for complications. HOME CARE INSTRUCTIONS  No special instructions are needed after a transfusion. You may find your energy is better. Speak with your caregiver about any limitations on activity for underlying diseases you may have. SEEK MEDICAL CARE IF:   Your condition is not improving after your transfusion.  You develop redness or irritation at the intravenous (IV) site. SEEK IMMEDIATE MEDICAL CARE IF:  Any of the following symptoms occur over the next 12 hours:  Shaking chills.  You have a temperature by mouth above 102 F (38.9 C), not controlled by medicine.  Chest, back, or muscle pain.  People around you feel you are not acting correctly or are confused.  Shortness of  breath or difficulty breathing.  Dizziness and fainting.  You get a rash or develop hives.  You have a decrease in urine output.  Your urine turns a dark color or changes to pink, red, or brown. Any of the following symptoms occur over the next 10 days:  You have a temperature by mouth above 102 F (38.9 C), not controlled by medicine.  Shortness of breath.  Weakness after normal activity.  The white part of the eye turns yellow (jaundice).  You have a decrease in the amount of urine or are urinating less often.  Your urine turns a dark color or changes to pink, red, or brown. Document Released: 02/23/2000 Document Revised: 05/20/2011 Document Reviewed: 10/12/2007 [facility] Patient Information 2014 [place], [place].  _______________________________________________________________________

## 2016-12-17 NOTE — Patient Instructions (Signed)

## 2016-12-17 NOTE — Progress Notes (Signed)
Subjective:    Patient ID: Debra Hopkins, female    DOB: Feb 20, 1941. Patient is being seen in f/u for management of diabetes requested by  Monico Blitz, MD  Past Medical History:  Diagnosis Date  . Arthritis   . CAD (coronary artery disease)   . Carotid artery occlusion   . Cataracts, bilateral   . DDD (degenerative disc disease)   . Diabetes mellitus    Type 2  . Diverticulitis   . Dyspnea   . Fibromyalgia   . GERD (gastroesophageal reflux disease)   . Hemorrhoids   . History of shingles   . Impaired hearing   . Macular degeneration   . Other and unspecified hyperlipidemia   . PVD (peripheral vascular disease) ([place])   . TIA (transient ischemic attack)   . Unspecified essential hypertension   . Varicose veins    Past Surgical History:  Procedure Laterality Date  . ANGIOPLASTY     left leg 2003  . APPENDECTOMY    . aspiration of a cyst in the right breast    . BALLOON DILATION N/A 10/01/2013   Procedure: BALLOON DILATION;  Surgeon: Rogene Houston, MD;  [facility];  Service: Endoscopy;  Laterality: N/A;  . BREAST BIOPSY    . BREAST BIOPSY Left   . CARDIAC CATHETERIZATION    . CAROTID ENDARTERECTOMY  12-03-06   left CEA  . CATARACT EXTRACTION W/PHACO Right 10/14/2013   Procedure: CATARACT EXTRACTION PHACO AND INTRAOCULAR LENS PLACEMENT (IOC);  Surgeon: Tonny Branch, MD;  Location: [facility];  Service: Ophthalmology;  Laterality: Right;  CDE 7.20  . CATARACT EXTRACTION W/PHACO Left 11/04/2013   Procedure: CATARACT EXTRACTION PHACO AND INTRAOCULAR LENS PLACEMENT (IOC);  Surgeon: Tonny Branch, MD;  Location: [facility];  Service: Ophthalmology;  Laterality: Left;  CDE 8.57  . COLONOSCOPY N/A 09/19/2014   Procedure: COLONOSCOPY;  Surgeon: Rogene Houston, MD;  [facility];  Service: Endoscopy;  Laterality: N/A;  7:30-8:30  . CYSTO WITH HYDRODISTENSION N/A 03/29/2016   Procedure: CYSTOSCOPY/HYDRODISTENSION;  Surgeon: Irine Seal, MD;  Location: [facility];  Service: Urology;   Laterality: N/A;  . CYSTOSCOPY WITH INJECTION N/A 03/29/2016   Procedure: CYSTOSCOPY WITH PYRIDIUM AND MARCAINE INSTALLATION;  Surgeon: Irine Seal, MD;  Location: [facility];  Service: Urology;  Laterality: N/A;  . ESOPHAGOGASTRODUODENOSCOPY N/A 10/01/2013   Procedure: ESOPHAGOGASTRODUODENOSCOPY (EGD);  Surgeon: Rogene Houston, MD;  [facility];  Service: Endoscopy;  Laterality: N/A;  120  . ESOPHAGOGASTRODUODENOSCOPY N/A 10/24/2014   Procedure: ESOPHAGOGASTRODUODENOSCOPY (EGD);  Surgeon: Rogene Houston, MD;  [facility];  Service: Endoscopy;  Laterality: N/A;  730  . GIVENS CAPSULE STUDY N/A 10/24/2014   Procedure: GIVENS CAPSULE STUDY;  Surgeon: Rogene Houston, MD;  [facility];  Service: Endoscopy;  Laterality: N/A;  . JOINT REPLACEMENT  2011   Right total knee replacement  . [place]  . KNEE ARTHROSCOPY  2009  . MALONEY DILATION N/A 10/01/2013   Procedure: Venia Minks DILATION;  Surgeon: Rogene Houston, MD;  [facility];  Service: Endoscopy;  Laterality: N/A;  . SAVORY DILATION N/A 10/01/2013   Procedure: SAVORY DILATION;  Surgeon: Rogene Houston, MD;  [facility];  Service: Endoscopy;  Laterality: N/A;  . SPINE SURGERY  1997   C5-c6 fusion  . TOTAL ABDOMINAL HYSTERECTOMY  1973   Social History   Social History  . Marital status: Widowed  Spouse name: N/A  . Number of children: N/A  . Years of education: N/A   Social History Main Topics  . Smoking status: Former Smoker    Types: Cigarettes    Quit date: 03/11/1985  . Smokeless tobacco: Never Used  . Alcohol use No  . Drug use: No  . Sexual activity: Not on file   Other Topics Concern  . Not on file   Social History Narrative   Retired. Married.    Outpatient Encounter Prescriptions as of 12/17/2016  Medication Sig  . Alum Hydroxide-Mag Trisilicate (GAVISCON) 82-95.6 MG CHEW Chew 2-3 tablets by mouth daily as needed (acid reflux).   Marland Kitchen aspirin  (ECOTRIN LOW STRENGTH) 81 MG EC tablet Take 81 mg by mouth daily.   . B Complex-Biotin-FA (SUPER B-COMPLEX PO) Take 1 tablet by mouth daily.   . Calcium Citrate (CALCITRATE PO) Take 1 tablet by mouth daily.   . Carboxymeth-Glycerin-Polysorb (REFRESH OPTIVE ADVANCED) 0.5-1-0.5 % SOLN Apply 1 drop to eye daily as needed (for dry eyes).   . Cholecalciferol (VITAMIN D) 2000 units tablet Take 2,000 Units by mouth daily.  . cloNIDine (CATAPRES) 0.1 MG tablet Take 0.1-0.2 mg by mouth See admin instructions. TAKES 0.1 MG IN THE MORNING AND 0.2 MG AT BEDTIME  . esomeprazole (NEXIUM) 20 MG capsule Take 20 mg by mouth daily before breakfast.   . fexofenadine (ALLEGRA) 30 MG/5ML suspension Take 30 mg by mouth daily as needed (for allergies). TAKES CHILDREN'S LIQUID   . GLOBAL EASE INJECT PEN NEEDLES 31G X 8 MM MISC USE ONCE DAILY WITH SOLOSTAR INSULIN (Patient not taking: Reported on 12/11/2016)  . hydrALAZINE (APRESOLINE) 25 MG tablet Take 25 mg by mouth daily.  . Insulin Glargine (LANTUS SOLOSTAR) 100 UNIT/ML Solostar Pen INJECT 35 UNITS INTO THE SKIN AT BEDTIME  . ketotifen (ALAWAY) 0.025 % ophthalmic solution Place 1 drop into both eyes daily as needed (for allergies).   Marland Kitchen levothyroxine (SYNTHROID, LEVOTHROID) 50 MCG tablet Take 50 mcg by mouth daily before breakfast.  . losartan (COZAAR) 100 MG tablet Take 100 mg by mouth daily.    . Magnesium 250 MG TABS Take 250 mg by mouth at bedtime.   Marland Kitchen MEGARED OMEGA-3 KRILL OIL PO Take 1 capsule by mouth daily.  . Methylcellulose, Laxative, (CITRUCEL PO) Take 1 each by mouth every other day.  . Misc Natural Products (OSTEO BI-FLEX ADV JOINT SHIELD PO) Take 1 tablet by mouth every other day.   . Multiple Vitamins-Minerals (PRESERVISION AREDS 2 PO) Take 1-2 tablets by mouth See admin instructions. Take 1 capsule by mouth every other day alternating with 2 capsules by mouth every other day  . NIFEdipine (PROCARDIA-XL/ADALAT-CC/NIFEDICAL-XL) 30 MG 24 hr tablet Take 30  mg by mouth daily.  Marland Kitchen OVER THE COUNTER MEDICATION Take 1 tablet by mouth daily. RAINBOW LIGHT MULTI VITAMIN  . Propylene Glycol (SYSTANE BALANCE OP) Apply 1 drop to eye daily as needed (for dry eyes).   . simvastatin (ZOCOR) 10 MG tablet Take 10 mg by mouth every other day.  . torsemide (DEMADEX) 20 MG tablet Take 20 mg by mouth daily.   . traZODone (DESYREL) 100 MG tablet Take 100 mg by mouth at bedtime.  . [DISCONTINUED] LANTUS SOLOSTAR 100 UNIT/ML Solostar Pen INJECT 30 UNITS INTO THE SKIN AT BEDTIME  . [DISCONTINUED] phenazopyridine (PYRIDIUM) 200 MG tablet Take 1 tablet (200 mg total) by mouth 3 (three) times daily as needed for pain. (Patient not taking: Reported on 12/11/2016)   No facility-administered  encounter medications on file as of 12/17/2016.    ALLERGIES: Allergies  Allergen Reactions  . Amoxicillin-Pot Clavulanate Hives and Other (See Comments)    Has patient had a PCN reaction causing immediate rash, facial/tongue/throat swelling, SOB or lightheadedness with hypotension: UNKNOWN Has patient had a PCN reaction causing severe rash involving mucus membranes or skin necrosis: UNKNOWN Has patient had a PCN reaction that required hospitalization No Has patient had a PCN reaction occurring within the last 10 years: No If all of the above answers are "NO", then may proceed with Cephalosporin use.   . Byetta 10 Mcg Pen [Exenatide] Other (See Comments)    Unknown  . Ceclor [Cefaclor] Other (See Comments)    Unknown  . Celexa [Citalopram Hydrobromide] Other (See Comments)    Sick  . Ciprofloxacin Nausea Only and Other (See Comments)    Dizziness  . Claritin [Loratadine] Other (See Comments)    Hair fell out  . Erythromycin Other (See Comments)    REACTION: Upset stomach  . Flexeril [Cyclobenzaprine] Other (See Comments)    Nervous  . Fosamax [Alendronate] Other (See Comments)    Made pt sick  . Jardiance [Empagliflozin] Other (See Comments)    Vaginal infection, UTI  .  Motrin [Ibuprofen] Nausea Only  . Naproxen Other (See Comments)    REACTION: severe nausea  . Norvasc [Amlodipine Besylate] Other (See Comments)    Unknown  . Prednisone Other (See Comments)    REACTION: itchy rash  . Prozac [Fluoxetine Hcl] Other (See Comments)    Sick  . Sulfamethoxazole-Trimethoprim Nausea And Vomiting and Other (See Comments)    REACTION: Upset stomach   VACCINATION STATUS:  There is no immunization history on file for this patient.  Diabetes  She presents for her follow-up diabetic visit. She has type 2 diabetes mellitus. Onset time: She was diagnosed at approximate age of 33 years. Her disease course has been stable. There are no hypoglycemic associated symptoms. Pertinent negatives for hypoglycemia include no confusion, headaches, pallor or seizures. Associated symptoms include fatigue. Pertinent negatives for diabetes include no chest pain, no polydipsia, no polyphagia and no polyuria. There are no hypoglycemic complications. Symptoms are stable. Diabetic complications include nephropathy. Risk factors for coronary artery disease include dyslipidemia, diabetes mellitus, hypertension, obesity, sedentary lifestyle and tobacco exposure (She smoked for 28 years before she quit.). Current diabetic treatment includes oral agent (monotherapy). Her weight is stable. She is following a generally unhealthy diet. When asked about meal planning, she reported none. She has not had a previous visit with a dietitian. She rarely participates in exercise. Her home blood glucose trend is decreasing steadily. Her breakfast blood glucose range is generally 140-180 mg/dl. Her overall blood glucose range is 140-180 mg/dl. An ACE inhibitor/angiotensin II receptor blocker is being taken. Eye exam is current.  Hyperlipidemia  This is a chronic problem. The current episode started more than 1 year ago. The problem is controlled. Recent lipid tests were reviewed and are variable. Exacerbating  diseases include diabetes, hypothyroidism and obesity. Pertinent negatives include no chest pain, myalgias or shortness of breath. Current antihyperlipidemic treatment includes statins. Risk factors for coronary artery disease include diabetes mellitus, dyslipidemia, hypertension, family history, obesity and a sedentary lifestyle.  Hypertension  This is a chronic problem. The current episode started more than 1 year ago. The problem is controlled. Pertinent negatives include no chest pain, headaches, palpitations or shortness of breath. Risk factors for coronary artery disease include diabetes mellitus, dyslipidemia, obesity, sedentary lifestyle and smoking/tobacco  exposure. Past treatments include angiotensin blockers. Hypertensive end-organ damage includes kidney disease.    Review of Systems  Constitutional: Positive for fatigue. Negative for unexpected weight change.  HENT: Negative for trouble swallowing and voice change.   Eyes: Negative for visual disturbance.  Respiratory: Negative for cough, shortness of breath and wheezing.   Cardiovascular: Negative for chest pain, palpitations and leg swelling.  Gastrointestinal: Negative for diarrhea, nausea and vomiting.  Endocrine: Negative for cold intolerance, heat intolerance, polydipsia, polyphagia and polyuria.  Genitourinary: Positive for frequency. Negative for dysuria and flank pain.  Musculoskeletal: Negative for arthralgias and myalgias.  Skin: Negative for color change, pallor, rash and wound.  Neurological: Negative for seizures and headaches.  Psychiatric/Behavioral: Negative for confusion and suicidal ideas.    Objective:    BP 138/75   Pulse 89   Ht 5\' 3"  (1.6 m)   Wt 204 lb (92.5 kg)   BMI 36.14 kg/m   Wt Readings from Last 3 Encounters:  12/17/16 204 lb (92.5 kg)  09/16/16 205 lb (93 kg)  05/28/16 202 lb (91.6 kg)    Physical Exam  Constitutional: She is oriented to person, place, and time. She appears well-developed.   HENT:  Head: Normocephalic and atraumatic.  Eyes: EOM are normal.  Neck: Normal range of motion. Neck supple. No tracheal deviation present. No thyromegaly present.  Cardiovascular: Normal rate and regular rhythm.   Pulmonary/Chest: Effort normal and breath sounds normal.  Abdominal: Soft. Bowel sounds are normal. There is no tenderness. There is no guarding.  Musculoskeletal: Normal range of motion. She exhibits no edema.  Neurological: She is alert and oriented to person, place, and time. She has normal reflexes. No cranial nerve deficit. Coordination normal.  Skin: Skin is warm and dry. No rash noted. No erythema. No pallor.  Psychiatric: She has a normal mood and affect. Judgment normal.    CMP     Component Value Date/Time   NA 137 09/09/2016 0919   K 5.0 09/09/2016 0919   CL 99 09/09/2016 0919   CO2 23 09/09/2016 0919   GLUCOSE 185 (H) 09/09/2016 0919   GLUCOSE 159 (H) 03/29/2016 0951   BUN 46 (A) 12/10/2016   CREATININE 1.0 12/10/2016   CREATININE 1.04 (H) 09/09/2016 0919   CALCIUM 9.5 09/09/2016 0919   PROT 6.3 09/09/2016 0919   ALBUMIN 4.5 09/09/2016 0919   AST 18 09/09/2016 0919   ALT 17 09/09/2016 0919   ALKPHOS 84 09/09/2016 0919   BILITOT 0.5 09/09/2016 0919   GFRNONAA 52 (L) 09/09/2016 0919   GFRAA 60 09/09/2016 0919   Recent Results (from the past 2160 hour(s))  Basic metabolic panel     Status: Abnormal   Collection Time: 12/10/16 12:00 AM  Result Value Ref Range   BUN 46 (A) 4 - 21   Creatinine 1.0 0.5 - 1.1  Hemoglobin A1c     Status: None   Collection Time: 12/10/16 12:00 AM  Result Value Ref Range   Hemoglobin A1C 7.4       Assessment & Plan:   1. Type 2 diabetes mellitus with stage 3 chronic kidney disease, without long-term current use of insulin ([place])  - Patient has currently uncontrolled symptomatic type 2 DM since  76 years of age. - She came with near target blood gluco at fasting with A1c of 7.4%, overall improving from 8.4%.  -  Recent labs reviewed.  - Of note, she has chronic blood loss anemia with hemoglobin at 10.8 which may underestimate  her glycemic burden.   Her diabetes is complicated by chronic kidney disease stage 3 and patient remains at a high risk for more acute and chronic complications of diabetes which include CAD, CVA, CKD, retinopathy, and neuropathy. These are all discussed in detail with the patient.  - I have counseled the patient on diet management and weight loss, by adopting a carbohydrate restricted/protein rich diet.  - Suggestion is made for patient to avoid simple carbohydrates   from her diet including Cakes , Desserts, Ice Cream,  Soda (  diet and regular) , Sweet Tea , Candies,  Chips, Cookies, Artificial Sweeteners,   and "Sugar-free" Products . This will help patient to have stable blood glucose profile and potentially avoid unintended weight gain.  - I encouraged the patient to switch to  unprocessed or minimally processed complex starch and increased protein intake (animal or plant source), fruits, and vegetables.  - Patient is advised to stick to a routine mealtimes to eat 3 meals  a day and avoid unnecessary snacks ( to snack only to correct hypoglycemia).   - I have approached patient with the following individualized plan to manage diabetes and patient agrees:   - She will  continue to need basal  insulin treatment. - she became comfortable using insulin.  -I will increase her Lantus to 35 units daily at bedtime  associated with monitoring of blood glucose twice a day before breakfast and at bedtime.  - Her renal function is improving with GFR of 52, this would open a chance for her to use low-dose metformin or SGLT2 inhibitors during next visit . - However she is skeptical of medications including metformin saying that she "heard bad things about metformin on TV and also her sister told her that metformin is not safe". - She  is advised to continue follow-up and pursue GI workup  for chronic blood loss. - Patient specific target  A1c;  LDL, HDL, Triglycerides, and  Waist Circumference were discussed in detail.  2) BP/HTN: Controlled. Continue current medications including ACEI/ARB. 3) Lipids/HPL: controlled, continue statins. 4)  Weight/Diet: CDE Consult is in progress  , exercise, and detailed carbohydrates information provided.  5) Chronic Care/Health Maintenance:  -Patient is on ACEI/ARB and Statin medications and encouraged to continue to follow up with Ophthalmology, Podiatrist at least yearly or according to recommendations, and advised to   stay away from smoking. I have recommended yearly flu vaccine and pneumonia vaccination at least every 5 years; moderate intensity exercise for up to 150 minutes weekly; and  sleep for at least 7 hours a day.  - Time spent with the patient: 25 min, of which >50% was spent in reviewing her sugar logs , discussing her hypo- and hyper-glycemic episodes, reviewing her current and  previous labs and insulin doses and developing a plan to avoid hypo- and hyper-glycemia.   - I advised patient to maintain close follow up with Monico Blitz, MD for primary care needs.  Follow up plan: - Return in about 3 months (around 03/19/2017) for follow up with pre-visit labs, meter, and logs.  Glade Lloyd, MD Phone: [phone]  Fax: [phone]   -  This note was partially dictated with voice recognition software. Similar sounding words can be transcribed inadequately or may not  be corrected upon review.  12/17/2016, 1:49 PM

## 2016-12-17 NOTE — Progress Notes (Signed)
12-10-16 (EPIC) BMP, HGAIC  09-16-16 Surgical Clearance from Dr. Dorris Fetch on chart  03-29-16 [facility]) EKG

## 2016-12-18 ENCOUNTER — Encounter (HOSPITAL_COMMUNITY): Payer: Self-pay

## 2016-12-18 ENCOUNTER — Encounter (HOSPITAL_COMMUNITY)
Admission: RE | Admit: 2016-12-18 | Discharge: 2016-12-18 | Disposition: A | Discharge status: Home / Self Care | Payer: Medicare Other | Source: Ambulatory Visit | Attending: Orthopedic Surgery | Admitting: Orthopedic Surgery

## 2016-12-18 DIAGNOSIS — K219 Gastro-esophageal reflux disease without esophagitis: Secondary | ICD-10-CM | POA: Insufficient documentation

## 2016-12-18 DIAGNOSIS — E1122 Type 2 diabetes mellitus with diabetic chronic kidney disease: Secondary | ICD-10-CM | POA: Diagnosis not present

## 2016-12-18 DIAGNOSIS — N183 Chronic kidney disease, stage 3 (moderate): Secondary | ICD-10-CM | POA: Diagnosis not present

## 2016-12-18 DIAGNOSIS — Z01812 Encounter for preprocedural laboratory examination: Secondary | ICD-10-CM | POA: Insufficient documentation

## 2016-12-18 DIAGNOSIS — E039 Hypothyroidism, unspecified: Secondary | ICD-10-CM | POA: Diagnosis not present

## 2016-12-18 HISTORY — DX: Cardiac murmur, unspecified: R01.1

## 2016-12-18 LAB — COMPREHENSIVE METABOLIC PANEL
ALBUMIN: 4.5 g/dL (ref 3.5–5.0)
ALT: 20 U/L (ref 14–54)
AST: 21 U/L (ref 15–41)
Alkaline Phosphatase: 75 U/L (ref 38–126)
Anion gap: 10 (ref 5–15)
BILIRUBIN TOTAL: 0.2 mg/dL — AB (ref 0.3–1.2)
BUN: 40 mg/dL — ABNORMAL HIGH (ref 6–20)
CHLORIDE: 106 mmol/L (ref 101–111)
CO2: 25 mmol/L (ref 22–32)
Calcium: 9.2 mg/dL (ref 8.9–10.3)
Creatinine, Ser: 1.34 mg/dL — ABNORMAL HIGH (ref 0.44–1.00)
GFR calc Af Amer: 43 mL/min — ABNORMAL LOW (ref >60)
GFR calc non Af Amer: 37 mL/min — ABNORMAL LOW (ref >60)
GLUCOSE: 246 mg/dL — AB (ref 65–99)
POTASSIUM: 4.5 mmol/L (ref 3.5–5.1)
Sodium: 141 mmol/L (ref 135–145)
Total Protein: 6.9 g/dL (ref 6.5–8.1)

## 2016-12-18 LAB — GLUCOSE, CAPILLARY: Glucose-Capillary: 242 mg/dL — ABNORMAL HIGH (ref 65–99)

## 2016-12-18 LAB — PROTIME-INR
INR: 1.01
Prothrombin Time: 13.2 seconds (ref 11.4–15.2)

## 2016-12-18 LAB — CBC
HCT: 29.3 % — ABNORMAL LOW (ref 36.0–46.0)
Hemoglobin: 9.8 g/dL — ABNORMAL LOW (ref 12.0–15.0)
MCH: 31.2 pg (ref 26.0–34.0)
MCHC: 33.4 g/dL (ref 30.0–36.0)
MCV: 93.3 fL (ref 78.0–100.0)
PLATELETS: 655 10*3/uL — AB (ref 150–400)
RBC: 3.14 MIL/uL — ABNORMAL LOW (ref 3.87–5.11)
RDW: 17.9 % — AB (ref 11.5–15.5)
WBC: 4.5 10*3/uL (ref 4.0–10.5)

## 2016-12-18 LAB — APTT: APTT: 31 s (ref 24–36)

## 2016-12-19 ENCOUNTER — Ambulatory Visit: Payer: Self-pay | Admitting: Orthopedic Surgery

## 2016-12-19 LAB — SURGICAL PCR SCREEN
MRSA, PCR: POSITIVE — AB
STAPHYLOCOCCUS AUREUS: POSITIVE — AB

## 2016-12-19 MED ORDER — VANCOMYCIN HCL 10 G IV SOLR: 1000.0000 mg | Freq: Once | INTRAVENOUS | Status: DC

## 2016-12-19 NOTE — Progress Notes (Signed)
12-18-16 CBC, CMP, and PCR result routed to Dr. Wynelle Link for review.

## 2016-12-19 NOTE — Progress Notes (Signed)
12-19-16 Contacted patient regarding her PCR results. Per patient, her surgery has been cancelled, per Dr. Anne Fu office.

## 2016-12-23 ENCOUNTER — Inpatient Hospital Stay (HOSPITAL_COMMUNITY): Admission: RE | Admit: 2016-12-23 | Payer: Medicare Other | Source: Ambulatory Visit | Admitting: Orthopedic Surgery

## 2016-12-23 ENCOUNTER — Encounter (HOSPITAL_COMMUNITY): Admission: RE | Payer: Self-pay | Source: Ambulatory Visit

## 2016-12-23 LAB — TYPE AND SCREEN
ABO/RH(D): A POS
ANTIBODY SCREEN: NEGATIVE

## 2016-12-23 SURGERY — ARTHROPLASTY, KNEE, TOTAL
Anesthesia: Choice | Site: Knee | Laterality: Left

## 2017-01-10 ENCOUNTER — Other Ambulatory Visit (HOSPITAL_COMMUNITY)
Admission: RE | Admit: 2017-01-10 | Discharge: 2017-01-10 | Disposition: A | Discharge status: Home / Self Care | Payer: Medicare Other | Source: Ambulatory Visit | Attending: Diagnostic Radiology | Admitting: Diagnostic Radiology

## 2017-01-10 DIAGNOSIS — I2510 Atherosclerotic heart disease of native coronary artery without angina pectoris: Secondary | ICD-10-CM | POA: Diagnosis not present

## 2017-01-10 DIAGNOSIS — E038 Other specified hypothyroidism: Secondary | ICD-10-CM | POA: Insufficient documentation

## 2017-01-10 DIAGNOSIS — E119 Type 2 diabetes mellitus without complications: Secondary | ICD-10-CM | POA: Diagnosis not present

## 2017-01-10 DIAGNOSIS — D8989 Other specified disorders involving the immune mechanism, not elsewhere classified: Secondary | ICD-10-CM | POA: Insufficient documentation

## 2017-01-10 DIAGNOSIS — D649 Anemia, unspecified: Secondary | ICD-10-CM | POA: Diagnosis present

## 2017-01-10 DIAGNOSIS — Z794 Long term (current) use of insulin: Secondary | ICD-10-CM | POA: Insufficient documentation

## 2017-01-10 DIAGNOSIS — D473 Essential (hemorrhagic) thrombocythemia: Secondary | ICD-10-CM | POA: Insufficient documentation

## 2017-01-10 DIAGNOSIS — M1A9XX0 Chronic gout, unspecified, without tophus (tophi): Secondary | ICD-10-CM | POA: Diagnosis not present

## 2017-02-27 ENCOUNTER — Encounter (HOSPITAL_COMMUNITY): Payer: Self-pay

## 2017-02-27 LAB — CHROMOSOME ANALYSIS, BONE MARROW

## 2017-03-12 LAB — TSH: TSH: 5.04 (ref <=5.90)

## 2017-03-12 LAB — BASIC METABOLIC PANEL
BUN: 35 — AB (ref 4–21)
CREATININE: 1.3 — AB (ref <=1.1)

## 2017-03-12 LAB — HEMOGLOBIN A1C: Hemoglobin A1C: 7.6

## 2017-03-12 LAB — VITAMIN D 25 HYDROXY (VIT D DEFICIENCY, FRACTURES): VIT D 25 HYDROXY: 44

## 2017-03-19 ENCOUNTER — Ambulatory Visit: Payer: Medicare Other | Admitting: Nutrition

## 2017-03-19 ENCOUNTER — Ambulatory Visit: Payer: Medicare Other | Admitting: "Endocrinology

## 2017-03-20 ENCOUNTER — Encounter (HOSPITAL_COMMUNITY): Payer: Self-pay

## 2017-03-24 ENCOUNTER — Other Ambulatory Visit: Payer: Self-pay | Admitting: "Endocrinology

## 2017-04-03 ENCOUNTER — Encounter: Payer: Self-pay | Admitting: "Endocrinology

## 2017-04-03 ENCOUNTER — Ambulatory Visit: Payer: Medicare Other | Admitting: "Endocrinology

## 2017-04-03 VITALS — BP 146/74 | HR 86 | Ht 63.0 in | Wt 204.0 lb

## 2017-04-03 DIAGNOSIS — N183 Chronic kidney disease, stage 3 unspecified: Secondary | ICD-10-CM

## 2017-04-03 DIAGNOSIS — E1122 Type 2 diabetes mellitus with diabetic chronic kidney disease: Secondary | ICD-10-CM

## 2017-04-03 DIAGNOSIS — I10 Essential (primary) hypertension: Secondary | ICD-10-CM

## 2017-04-03 DIAGNOSIS — E782 Mixed hyperlipidemia: Secondary | ICD-10-CM

## 2017-04-03 DIAGNOSIS — E038 Other specified hypothyroidism: Secondary | ICD-10-CM | POA: Diagnosis not present

## 2017-04-03 MED ORDER — METFORMIN HCL 500 MG PO TABS: 500.0000 mg | ORAL_TABLET | Freq: Two times a day (BID) | ORAL | 2 refills | Status: DC

## 2017-04-03 NOTE — Patient Instructions (Signed)

## 2017-04-03 NOTE — Progress Notes (Signed)
Subjective:    Patient ID: Debra Hopkins, female    DOB: [birth year]. Patient is being seen in f/u for management of diabetes requested by  Monico Blitz, MD  Past Medical History:  Diagnosis Date  . Arthritis   . CAD (coronary artery disease)   . Carotid artery occlusion   . Cataracts, bilateral   . DDD (degenerative disc disease)   . Diabetes mellitus    Type 2  . Diverticulitis   . Dyspnea   . Fibromyalgia   . GERD (gastroesophageal reflux disease)   . Heart murmur   . Hemorrhoids   . History of shingles   . Impaired hearing   . Macular degeneration   . Other and unspecified hyperlipidemia   . PVD (peripheral vascular disease) ([place])   . TIA (transient ischemic attack)   . Unspecified essential hypertension   . Varicose veins    Past Surgical History:  Procedure Laterality Date  . ANGIOPLASTY     left leg 2003  . APPENDECTOMY    . aspiration of a cyst in the right breast    . BALLOON DILATION N/A 10/01/2013   Procedure: BALLOON DILATION;  Surgeon: Rogene Houston, MD;  [facility];  Service: Endoscopy;  Laterality: N/A;  . BREAST BIOPSY    . BREAST BIOPSY Left   . CARDIAC CATHETERIZATION    . CAROTID ENDARTERECTOMY  12-03-06   left CEA  . CATARACT EXTRACTION W/PHACO Right 10/14/2013   Procedure: CATARACT EXTRACTION PHACO AND INTRAOCULAR LENS PLACEMENT (IOC);  Surgeon: Tonny Branch, MD;  Location: [facility];  Service: Ophthalmology;  Laterality: Right;  CDE 7.20  . CATARACT EXTRACTION W/PHACO Left 11/04/2013   Procedure: CATARACT EXTRACTION PHACO AND INTRAOCULAR LENS PLACEMENT (IOC);  Surgeon: Tonny Branch, MD;  Location: [facility];  Service: Ophthalmology;  Laterality: Left;  CDE 8.57  . COLONOSCOPY N/A 09/19/2014   Procedure: COLONOSCOPY;  Surgeon: Rogene Houston, MD;  [facility];  Service: Endoscopy;  Laterality: N/A;  7:30-8:30  . CYSTO WITH HYDRODISTENSION N/A 03/29/2016   Procedure: CYSTOSCOPY/HYDRODISTENSION;  Surgeon: Irine Seal, MD;  Location: [facility];   Service: Urology;  Laterality: N/A;  . CYSTOSCOPY WITH INJECTION N/A 03/29/2016   Procedure: CYSTOSCOPY WITH PYRIDIUM AND MARCAINE INSTALLATION;  Surgeon: Irine Seal, MD;  Location: [facility];  Service: Urology;  Laterality: N/A;  . ESOPHAGOGASTRODUODENOSCOPY N/A 10/01/2013   Procedure: ESOPHAGOGASTRODUODENOSCOPY (EGD);  Surgeon: Rogene Houston, MD;  [facility];  Service: Endoscopy;  Laterality: N/A;  120  . ESOPHAGOGASTRODUODENOSCOPY N/A 10/24/2014   Procedure: ESOPHAGOGASTRODUODENOSCOPY (EGD);  Surgeon: Rogene Houston, MD;  [facility];  Service: Endoscopy;  Laterality: N/A;  730  . GIVENS CAPSULE STUDY N/A 10/24/2014   Procedure: GIVENS CAPSULE STUDY;  Surgeon: Rogene Houston, MD;  [facility];  Service: Endoscopy;  Laterality: N/A;  . JOINT REPLACEMENT  2011   Right total knee replacement  . Debra Hopkins  . KNEE ARTHROSCOPY  2009  . MALONEY DILATION N/A 10/01/2013   Procedure: Venia Minks DILATION;  Surgeon: Rogene Houston, MD;  [facility];  Service: Endoscopy;  Laterality: N/A;  . SAVORY DILATION N/A 10/01/2013   Procedure: SAVORY DILATION;  Surgeon: Rogene Houston, MD;  [facility];  Service: Endoscopy;  Laterality: N/A;  . SPINE SURGERY  1997   C5-c6 fusion  . TOTAL ABDOMINAL HYSTERECTOMY  1973   Social History   Socioeconomic History  . Marital  status: Widowed    Spouse name: None  . Number of children: None  . Years of education: None  . Highest education level: None  Social Needs  . Financial resource strain: None  . Food insecurity - worry: None  . Food insecurity - inability: None  . Transportation needs - medical: None  . Transportation needs - non-medical: None  Occupational History  . None  Tobacco Use  . Smoking status: Former Smoker    Types: Cigarettes    Last attempt to quit: 03/11/1985    Years since quitting: 32.0  . Smokeless tobacco: Never Used  Substance and Sexual Activity  . Alcohol  use: No    Alcohol/week: 0.0 oz  . Drug use: No  . Sexual activity: None  Other Topics Concern  . None  Social History Narrative   Retired. Married.    Outpatient Encounter Medications as of 04/03/2017  Medication Sig  . Alum Hydroxide-Mag Trisilicate (GAVISCON) 16-10.9 MG CHEW Chew 2-3 tablets by mouth daily as needed (acid reflux).   . Carboxymeth-Glycerin-Polysorb (REFRESH OPTIVE ADVANCED) 0.5-1-0.5 % SOLN Apply 1 drop to eye daily as needed (for dry eyes).   . cloNIDine (CATAPRES) 0.1 MG tablet Take 0.1-0.2 mg by mouth See admin instructions. TAKES 0.1 MG IN THE MORNING AND 0.2 MG AT BEDTIME  . esomeprazole (NEXIUM) 20 MG capsule Take 20 mg by mouth daily before breakfast.   . fexofenadine (ALLEGRA) 30 MG/5ML suspension Take 30 mg by mouth daily as needed (for allergies). TAKES CHILDREN'S LIQUID   . hydrALAZINE (APRESOLINE) 25 MG tablet Take 25 mg by mouth daily.   . Insulin Glargine (LANTUS SOLOSTAR) 100 UNIT/ML Solostar Pen INJECT 35 UNITS SUB-Q EVERY DAY AT 10:00 PM.  . ketotifen (ALAWAY) 0.025 % ophthalmic solution Place 1 drop into both eyes daily as needed (for allergies).   Marland Kitchen levothyroxine (SYNTHROID, LEVOTHROID) 50 MCG tablet Take 50 mcg by mouth daily before breakfast.  . losartan (COZAAR) 100 MG tablet Take 100 mg by mouth daily.    . metFORMIN (GLUCOPHAGE) 500 MG tablet Take 1 tablet (500 mg total) by mouth 2 (two) times daily with a meal.  . Methylcellulose, Laxative, (CITRUCEL PO) Take 1 each by mouth every other day.  . Multiple Vitamins-Minerals (PRESERVISION AREDS 2 PO) Take 1-2 tablets by mouth See admin instructions. Take 1 capsule by mouth every other day alternating with 2 capsules by mouth every other day  . NIFEdipine (PROCARDIA-XL/ADALAT-CC/NIFEDICAL-XL) 30 MG 24 hr tablet Take 30 mg by mouth daily.  Marland Kitchen Propylene Glycol (SYSTANE BALANCE OP) Apply 1 drop to eye daily as needed (for dry eyes).   . simvastatin (ZOCOR) 10 MG tablet Take 10 mg by mouth every other day.   . torsemide (DEMADEX) 20 MG tablet Take 20 mg by mouth daily.   . traZODone (DESYREL) 100 MG tablet Take 100 mg by mouth at bedtime.  . [DISCONTINUED] aspirin (ECOTRIN LOW STRENGTH) 81 MG EC tablet Take 81 mg by mouth daily.   . [DISCONTINUED] B Complex-Biotin-FA (SUPER B-COMPLEX PO) Take 1 tablet by mouth daily.   . [DISCONTINUED] Calcium Citrate (CALCITRATE PO) Take 1 tablet by mouth daily.   . [DISCONTINUED] Cholecalciferol (VITAMIN D) 2000 units tablet Take 2,000 Units by mouth daily.  . [DISCONTINUED] GLOBAL EASE INJECT PEN NEEDLES 31G X 8 MM MISC USE ONCE DAILY WITH SOLOSTAR INSULIN (Patient not taking: Reported on 12/11/2016)  . [DISCONTINUED] Magnesium 250 MG TABS Take 250 mg by mouth at bedtime.   . [DISCONTINUED] MEGARED OMEGA-3  KRILL OIL PO Take 1 capsule by mouth daily.  . [DISCONTINUED] Misc Natural Products (OSTEO BI-FLEX ADV JOINT SHIELD PO) Take 1 tablet by mouth every other day.   . [DISCONTINUED] OVER THE COUNTER MEDICATION Take 1 tablet by mouth daily. RAINBOW LIGHT MULTI VITAMIN   Facility-Administered Encounter Medications as of 04/03/2017  Medication  . vancomycin (VANCOCIN) 1,000 mg in sodium chloride 0.9 % 500 mL IVPB   ALLERGIES: Allergies  Allergen Reactions  . Amoxicillin-Pot Clavulanate Hives and Other (See Comments)    Has patient had a PCN reaction causing immediate rash, facial/tongue/throat swelling, SOB or lightheadedness with hypotension: UNKNOWN Has patient had a PCN reaction causing severe rash involving mucus membranes or skin necrosis: UNKNOWN Has patient had a PCN reaction that required hospitalization No Has patient had a PCN reaction occurring within the last 10 years: No If all of the above answers are "NO", then may proceed with Cephalosporin use.   . Byetta 10 Mcg Pen [Exenatide] Other (See Comments)    Unknown  . Ceclor [Cefaclor] Other (See Comments)    Unknown  . Celexa [Citalopram Hydrobromide] Other (See Comments)    Sick  .  Ciprofloxacin Nausea Only and Other (See Comments)    Dizziness  . Claritin [Loratadine] Other (See Comments)    Hair fell out  . Erythromycin Other (See Comments)    REACTION: Upset stomach  . Flexeril [Cyclobenzaprine] Other (See Comments)    Nervous  . Fosamax [Alendronate] Other (See Comments)    Made pt sick  . Jardiance [Empagliflozin] Other (See Comments)    Vaginal infection, UTI  . Motrin [Ibuprofen] Nausea Only  . Naproxen Other (See Comments)    REACTION: severe nausea  . Norvasc [Amlodipine Besylate] Other (See Comments)    Unknown  . Prednisone Other (See Comments)    REACTION: itchy rash  . Prozac [Fluoxetine Hcl] Other (See Comments)    Sick  . Sulfamethoxazole-Trimethoprim Nausea And Vomiting and Other (See Comments)    REACTION: Upset stomach   VACCINATION STATUS:  There is no immunization history on file for this patient.  Diabetes  She presents for her follow-up diabetic visit. She has type 2 diabetes mellitus. Onset time: She was diagnosed at approximate age of 27 years. Her disease course has been worsening. There are no hypoglycemic associated symptoms. Pertinent negatives for hypoglycemia include no confusion, headaches, pallor or seizures. Associated symptoms include fatigue. Pertinent negatives for diabetes include no chest pain, no polydipsia, no polyphagia and no polyuria. There are no hypoglycemic complications. Symptoms are worsening. Diabetic complications include nephropathy. Risk factors for coronary artery disease include dyslipidemia, diabetes mellitus, hypertension, obesity, sedentary lifestyle and tobacco exposure (She smoked for 28 years before she quit.). Current diabetic treatment includes insulin injections. Her weight is stable. She is following a generally unhealthy diet. When asked about meal planning, she reported none. She has not had a previous visit with a dietitian. She rarely participates in exercise. Her home blood glucose trend is  decreasing steadily. Her breakfast blood glucose range is generally 140-180 mg/dl. Her overall blood glucose range is 140-180 mg/dl. An ACE inhibitor/angiotensin II receptor blocker is being taken. Eye exam is current.  Hyperlipidemia  This is a chronic problem. The current episode started more than 1 year ago. The problem is controlled. Recent lipid tests were reviewed and are variable. Exacerbating diseases include diabetes, hypothyroidism and obesity. Pertinent negatives include no chest pain, myalgias or shortness of breath. Current antihyperlipidemic treatment includes statins. Risk factors for coronary  artery disease include diabetes mellitus, dyslipidemia, hypertension, family history, obesity and a sedentary lifestyle.  Hypertension  This is a chronic problem. The current episode started more than 1 year ago. The problem is controlled. Pertinent negatives include no chest pain, headaches, palpitations or shortness of breath. Risk factors for coronary artery disease include diabetes mellitus, dyslipidemia, obesity, sedentary lifestyle and smoking/tobacco exposure. Past treatments include angiotensin blockers. Hypertensive end-organ damage includes kidney disease.    Review of Systems  Constitutional: Positive for fatigue. Negative for unexpected weight change.  HENT: Negative for trouble swallowing and voice change.   Eyes: Negative for visual disturbance.  Respiratory: Negative for cough, shortness of breath and wheezing.   Cardiovascular: Negative for chest pain, palpitations and leg swelling.  Gastrointestinal: Negative for diarrhea, nausea and vomiting.  Endocrine: Negative for cold intolerance, heat intolerance, polydipsia, polyphagia and polyuria.  Genitourinary: Positive for frequency. Negative for dysuria and flank pain.  Musculoskeletal: Negative for arthralgias and myalgias.  Skin: Negative for color change, pallor, rash and wound.  Neurological: Negative for seizures and  headaches.  Psychiatric/Behavioral: Negative for confusion and suicidal ideas.    Objective:    BP (!) 146/74   Pulse 86   Ht 5\' 3"  (1.6 m)   Wt 204 lb (92.5 kg)   BMI 36.14 kg/m   Wt Readings from Last 3 Encounters:  04/03/17 204 lb (92.5 kg)  12/18/16 205 lb 4 oz (93.1 kg)  12/17/16 204 lb (92.5 kg)    Physical Exam  Constitutional: She is oriented to person, place, and time. She appears well-developed.  HENT:  Head: Normocephalic and atraumatic.  Eyes: EOM are normal.  Neck: Normal range of motion. Neck supple. No tracheal deviation present. No thyromegaly present.  Cardiovascular: Normal rate and regular rhythm.  Pulmonary/Chest: Effort normal and breath sounds normal.  Abdominal: Soft. Bowel sounds are normal. There is no tenderness. There is no guarding.  Musculoskeletal: Normal range of motion. She exhibits no edema.  Neurological: She is alert and oriented to person, place, and time. She has normal reflexes. No cranial nerve deficit. Coordination normal.  Skin: Skin is warm and dry. No rash noted. No erythema. No pallor.  Psychiatric: She has a normal mood and affect. Judgment normal.    CMP     Component Value Date/Time   NA 141 12/18/2016 1508   NA 137 09/09/2016 0919   K 4.5 12/18/2016 1508   CL 106 12/18/2016 1508   CO2 25 12/18/2016 1508   GLUCOSE 246 (H) 12/18/2016 1508   BUN 35 (A) 03/12/2017   CREATININE 1.3 (A) 03/12/2017   CREATININE 1.34 (H) 12/18/2016 1508   CALCIUM 9.2 12/18/2016 1508   PROT 6.9 12/18/2016 1508   PROT 6.3 09/09/2016 0919   ALBUMIN 4.5 12/18/2016 1508   ALBUMIN 4.5 09/09/2016 0919   AST 21 12/18/2016 1508   ALT 20 12/18/2016 1508   ALKPHOS 75 12/18/2016 1508   BILITOT 0.2 (L) 12/18/2016 1508   BILITOT 0.5 09/09/2016 0919   GFRNONAA 37 (L) 12/18/2016 1508   GFRAA 43 (L) 12/18/2016 1508   Recent Results (from the past 2160 hour(s))  Chromosome Analysis, Bone Marrow     Status: None   Collection Time: 01/10/17 11:00 AM   Result Value Ref Range   Chromosome-Routine SEE SEPARATE REPORT     Comment: Performed at [place] metabolic panel     Status: Abnormal   Collection Time: 03/12/17 12:00 AM  Result Value Ref Range   BUN  35 (A) 4 - 21   Creatinine 1.3 (A) 0.5 - 1.1  Hemoglobin A1c     Status: None   Collection Time: 03/12/17 12:00 AM  Result Value Ref Range   Hemoglobin A1C 7.6   TSH     Status: None   Collection Time: 03/12/17 12:00 AM  Result Value Ref Range   TSH 5.04 0.41 - 5.90    Comment: free t4 1.32  VITAMIN D 25 Hydroxy (Vit-D Deficiency, Fractures)     Status: None   Collection Time: 03/12/17 12:00 AM  Result Value Ref Range   Vit D, 25-Hydroxy 44    Lipid Panel     Component Value Date/Time   CHOL 146 08/04/2015   TRIG 199 (A) 08/04/2015   HDL 37 08/04/2015   LDLCALC 69 08/04/2015    Assessment & Plan:   1. Type 2 diabetes mellitus with stage 3 chronic kidney disease, without long-term current use of insulin ([place])  - Patient has currently uncontrolled symptomatic type 2 DM since  77 years of age. - She came with near target blood gluco at fasting with stable A1c of 7.6%, overall improving from 8.4%.  - Recent labs reviewed.  - Of note, she has chronic blood loss anemia with hemoglobin at 10.8 which may underestimate her glycemic burden.   Her diabetes is complicated by chronic kidney disease stage 3 and patient remains at a high risk for more acute and chronic complications of diabetes which include CAD, CVA, CKD, retinopathy, and neuropathy. These are all discussed in detail with the patient.  - I have counseled the patient on diet management and weight loss, by adopting a carbohydrate restricted/protein rich diet.  -  Suggestion is made for her to avoid simple carbohydrates  from her diet including Cakes, Sweet Desserts / Pastries, Ice Cream, Soda (diet and regular), Sweet Tea, Candies, Chips, Cookies, Store Bought Juices, Alcohol in Excess of  1-2 drinks a  day, Artificial Sweeteners, and "Sugar-free" Products. This will help patient to have stable blood glucose profile and potentially avoid unintended weight gain.   - I encouraged the patient to switch to  unprocessed or minimally processed complex starch and increased protein intake (animal or plant source), fruits, and vegetables.  - Patient is advised to stick to a routine mealtimes to eat 3 meals  a day and avoid unnecessary snacks ( to snack only to correct hypoglycemia).   - I have approached patient with the following individualized plan to manage diabetes and patient agrees:   - She will  continue to need at least basal  insulin treatment. - she became comfortable using insulin.  -I will continue with her basal insulin Lantus  35 units daily at bedtime  associated with monitoring of blood glucose twice a day before breakfast and at bedtime.  - Her renal function is stable with creatinine at 1.28, GFR of 40. - She is now open for metformin therapy. I discussed in initiated low-dose metformin 500 mg by mouth twice a day with meals.  - She is not a candidate for SGLT2 inhibitors  at this time  .  - She  is advised to continue follow-up and pursue GI workup for chronic blood loss. - Patient specific target  A1c;  LDL, HDL, Triglycerides, and  Waist Circumference were discussed in detail.  2) BP/HTN:   her blood pressure is not controlled to target.  Continue current medications including ACEI/ARB.  3) Lipids/HPL: controlled with LDL of 69. I have advised  her to continue simvastatin 10 mg by mouth daily at bedtime. Side effects and precautions discussed with her.  4)  Weight/Diet: CDE Consult is in progress  , exercise, and detailed carbohydrates information provided.  5) Chronic Care/Health Maintenance:  -Patient is on ACEI/ARB and Statin medications and encouraged to continue to follow up with Ophthalmology, Podiatrist at least yearly or according to recommendations, and advised to    stay away from smoking. I have recommended yearly flu vaccine and pneumonia vaccination at least every 5 years; moderate intensity exercise for up to 150 minutes weekly; and  sleep for at least 7 hours a day.  - Time spent with the patient: 25 min, of which >50% was spent in reviewing her blood glucose logs , discussing her hypo- and hyper-glycemic episodes, reviewing her current and  previous labs and insulin doses and developing a plan to avoid hypo- and hyper-glycemia. Please refer to Patient Instructions for Blood Glucose Monitoring and Insulin/Medications Dosing Guide"  in media tab for additional information.  - I advised patient to maintain close follow up with Monico Blitz, MD for primary care needs.  Follow up plan: - Return in about 3 months (around 07/02/2017) for follow up with pre-visit labs, meter, and logs.  Glade Lloyd, MD Phone: [phone]  Fax: [phone]   -  This note was partially dictated with voice recognition software. Similar sounding words can be transcribed inadequately or may not  be corrected upon review.  04/03/2017, 2:25 PM

## 2017-04-08 ENCOUNTER — Ambulatory Visit (HOSPITAL_COMMUNITY)
Admission: RE | Admit: 2017-04-08 | Discharge: 2017-04-08 | Disposition: A | Discharge status: Home / Self Care | Payer: Medicare Other | Source: Ambulatory Visit | Attending: Family | Admitting: Family

## 2017-04-08 ENCOUNTER — Ambulatory Visit (INDEPENDENT_AMBULATORY_CARE_PROVIDER_SITE_OTHER)
Admission: RE | Admit: 2017-04-08 | Discharge: 2017-04-08 | Disposition: A | Discharge status: Home / Self Care | Payer: Medicare Other | Source: Ambulatory Visit | Attending: Family | Admitting: Family

## 2017-04-08 ENCOUNTER — Ambulatory Visit: Payer: Medicare Other | Admitting: Family

## 2017-04-08 ENCOUNTER — Encounter: Payer: Self-pay | Admitting: Family

## 2017-04-08 VITALS — BP 134/76 | HR 80 | Temp 97.3°F | Resp 18 | Ht 63.0 in | Wt 204.0 lb

## 2017-04-08 DIAGNOSIS — I6523 Occlusion and stenosis of bilateral carotid arteries: Secondary | ICD-10-CM | POA: Diagnosis not present

## 2017-04-08 DIAGNOSIS — I739 Peripheral vascular disease, unspecified: Secondary | ICD-10-CM

## 2017-04-08 DIAGNOSIS — I779 Disorder of arteries and arterioles, unspecified: Secondary | ICD-10-CM | POA: Diagnosis not present

## 2017-04-08 DIAGNOSIS — Z959 Presence of cardiac and vascular implant and graft, unspecified: Secondary | ICD-10-CM | POA: Insufficient documentation

## 2017-04-08 DIAGNOSIS — Z9889 Other specified postprocedural states: Secondary | ICD-10-CM

## 2017-04-08 DIAGNOSIS — Z87891 Personal history of nicotine dependence: Secondary | ICD-10-CM | POA: Diagnosis not present

## 2017-04-08 LAB — VAS US CAROTID
LCCADDIAS: 26 cm/s
LCCAPDIAS: 28 cm/s
LCCAPSYS: 130 cm/s
LEFT ECA DIAS: -13 cm/s
LEFT VERTEBRAL DIAS: 26 cm/s
LICADDIAS: -33 cm/s
LICAPDIAS: -21 cm/s
LICAPSYS: -90 cm/s
Left CCA dist sys: 116 cm/s
Left ICA dist sys: -116 cm/s
RIGHT CCA MID DIAS: 22 cm/s
RIGHT ECA DIAS: -18 cm/s
RIGHT VERTEBRAL DIAS: 17 cm/s
Right CCA prox dias: 16 cm/s
Right CCA prox sys: 76 cm/s
Right cca dist sys: -94 cm/s

## 2017-04-08 NOTE — Progress Notes (Signed)
[facility] [place] HISTORY AND PHYSICAL   CC: Follow up extracranial carotid artery stenosis and peripheral artery occlusive disease    History of Present Illness:   Debra Hopkins is a 77 y.o. female who is status post left CEA in September 2008 by Dr. Kellie Simmering, left SFA PTA with stent 2003. She returns today for surveillance.  Was having cramping in left calf after walking about 1/2 block, but she is not walking enough to elicit claudication sx's since about the Summer of 2018 when her fatigue increased; denies non-healing wounds, denies rest pain. 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 She did nothave hemiplegia, monocular blindness, facial drooping, or aphasia. Her DM is in good control, she stopped smoking in the 1990's, she takes a daily ASA and a statin, she is obese. She rarely drinks ETOH.  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   On endoscopy it was found she has a hiatal hernia, diverticulosis; a small amount of fresh blood was found in the small intestine; she is anemic, had iron infusions, states she may need a port to draw blood since her veins are so small.   She has known c-spine ans Hopkins-spine issues.  A spot on her lung was found that needs further evaluation.   Pt Diabetic: Yes, last A1C result on file was 7.6 on 03-12-17, she started metformin and Lantus since this Pt smoker: former smoker, quit in the 1990's  Pt meds include: Statin :Yes ASA: Yes Other anticoagulants/antiplatelets: no     Current Outpatient Medications  Medication Sig Dispense Refill  . Alum Hydroxide-Mag Trisilicate  (GAVISCON) 71-24.5 MG CHEW Chew 2-3 tablets by mouth daily as needed (acid reflux).     . Carboxymeth-Glycerin-Polysorb (REFRESH OPTIVE ADVANCED) 0.5-1-0.5 % SOLN Apply 1 drop to eye daily as needed (for dry eyes).     . cloNIDine (CATAPRES) 0.1 MG tablet Take 0.1-0.2 mg by mouth See admin instructions. TAKES 0.1 MG IN THE MORNING AND 0.2 MG AT BEDTIME    . esomeprazole (NEXIUM) 20 MG capsule Take 20 mg by mouth daily before breakfast.     . fexofenadine (ALLEGRA) 30 MG/5ML suspension Take 30 mg by mouth daily as needed (for allergies). TAKES CHILDREN'S LIQUID     . hydrALAZINE (APRESOLINE) 25 MG tablet Take 25 mg by mouth daily.     . Insulin Glargine (LANTUS SOLOSTAR) 100 UNIT/ML Solostar Pen INJECT 35 UNITS SUB-Q EVERY DAY AT 10:00 PM. 15 mL 2  . ketotifen (ALAWAY) 0.025 % ophthalmic solution Place 1 drop into both eyes daily as needed (for allergies).     Marland Kitchen levothyroxine (SYNTHROID, LEVOTHROID) 50 MCG tablet Take 50 mcg by mouth daily before breakfast.    . losartan (COZAAR) 100 MG tablet Take 100 mg by mouth daily.      . metFORMIN (GLUCOPHAGE) 500 MG tablet Take 1 tablet (500 mg total) by mouth 2 (two) times daily with a meal. 60 tablet 2  . Methylcellulose, Laxative, (CITRUCEL PO) Take 1 each by mouth every other day.    . Multiple Vitamins-Minerals (PRESERVISION AREDS 2 PO) Take 1-2 tablets by mouth  See admin instructions. Take 1 capsule by mouth every other day alternating with 2 capsules by mouth every other day    . NIFEdipine (PROCARDIA-XL/ADALAT-CC/NIFEDICAL-XL) 30 MG 24 hr tablet Take 30 mg by mouth daily.    Marland Kitchen Propylene Glycol (SYSTANE BALANCE OP) Apply 1 drop to eye daily as needed (for dry eyes).     . simvastatin (ZOCOR) 10 MG tablet Take 10 mg by mouth every other day.    . torsemide (DEMADEX) 20 MG tablet Take 20 mg by mouth daily.     . traZODone (DESYREL) 100 MG tablet Take 100 mg by mouth at bedtime.     No current facility-administered medications for this visit.     Facility-Administered Medications Ordered in Other Visits  Medication Dose Route Frequency Provider Last Rate Last Dose  . vancomycin (VANCOCIN) 1,000 mg in sodium chloride 0.9 % 500 mL IVPB  1,000 mg Intravenous Once Dara Lords, Debra Hopkins, Debra Hopkins        Past Medical History:  Diagnosis Date  . Arthritis   . CAD (coronary artery disease)   . Carotid artery occlusion   . Cataracts, bilateral   . DDD (degenerative disc disease)   . Diabetes mellitus    Type 2  . Diverticulitis   . Dyspnea   . Fibromyalgia   . GERD (gastroesophageal reflux disease)   . Heart murmur   . Hemorrhoids   . History of shingles   . Impaired hearing   . Macular degeneration   . Other and unspecified hyperlipidemia   . PVD (peripheral vascular disease) ([place])   . TIA (transient ischemic attack)   . Unspecified essential hypertension   . Varicose veins     Social History Social History   Tobacco Use  . Smoking status: Former Smoker    Types: Cigarettes    Last attempt to quit: 03/11/1985    Years since quitting: 32.0  . Smokeless tobacco: Never Used  Substance Use Topics  . Alcohol use: No    Alcohol/week: 0.0 oz  . Drug use: No    Family History Family History  Problem Relation Age of Onset  . Stroke Mother   . Hypertension Mother   . Stroke Father   . Hypertension Father   . Diabetes Father   . Coronary artery disease Father   . Heart disease Father   . Diabetes Sister   . Hypertension Sister   . Lung cancer Brother   . Sinusitis Brother   . Healthy Brother   . Diabetes Son   . Hypertension Son   . Coronary artery disease Other     Surgical History Past Surgical History:  Procedure Laterality Date  . ANGIOPLASTY     left leg 2003  . APPENDECTOMY    . aspiration of a cyst in the right breast    . BALLOON DILATION N/A 10/01/2013   Procedure: BALLOON DILATION;  Surgeon: Rogene Houston, MD;  [facility];  Service: Endoscopy;  Laterality: N/A;  . BREAST BIOPSY     . BREAST BIOPSY Left   . CARDIAC CATHETERIZATION    . CAROTID ENDARTERECTOMY  12-03-06   left CEA  . CATARACT EXTRACTION W/PHACO Right 10/14/2013   Procedure: CATARACT EXTRACTION PHACO AND INTRAOCULAR LENS PLACEMENT (IOC);  Surgeon: Tonny Branch, MD;  Location: [facility];  Service: Ophthalmology;  Laterality: Right;  CDE 7.20  . CATARACT EXTRACTION W/PHACO Left 11/04/2013   Procedure: CATARACT EXTRACTION PHACO AND INTRAOCULAR LENS PLACEMENT (IOC);  Surgeon: Tonny Branch,  MD;  Location: [facility];  Service: Ophthalmology;  Laterality: Left;  CDE 8.57  . COLONOSCOPY N/A 09/19/2014   Procedure: COLONOSCOPY;  Surgeon: Rogene Houston, MD;  [facility];  Service: Endoscopy;  Laterality: N/A;  7:30-8:30  . CYSTO WITH HYDRODISTENSION N/A 03/29/2016   Procedure: CYSTOSCOPY/HYDRODISTENSION;  Surgeon: Irine Seal, MD;  Location: [facility];  Service: Urology;  Laterality: N/A;  . CYSTOSCOPY WITH INJECTION N/A 03/29/2016   Procedure: CYSTOSCOPY WITH PYRIDIUM AND MARCAINE INSTALLATION;  Surgeon: Irine Seal, MD;  Location: [facility];  Service: Urology;  Laterality: N/A;  . ESOPHAGOGASTRODUODENOSCOPY N/A 10/01/2013   Procedure: ESOPHAGOGASTRODUODENOSCOPY (EGD);  Surgeon: Rogene Houston, MD;  [facility];  Service: Endoscopy;  Laterality: N/A;  120  . ESOPHAGOGASTRODUODENOSCOPY N/A 10/24/2014   Procedure: ESOPHAGOGASTRODUODENOSCOPY (EGD);  Surgeon: Rogene Houston, MD;  [facility];  Service: Endoscopy;  Laterality: N/A;  730  . GIVENS CAPSULE STUDY N/A 10/24/2014   Procedure: GIVENS CAPSULE STUDY;  Surgeon: Rogene Houston, MD;  [facility];  Service: Endoscopy;  Laterality: N/A;  . JOINT REPLACEMENT  2011   Right total knee replacement  . [place]  . KNEE ARTHROSCOPY  2009  . MALONEY DILATION N/A 10/01/2013   Procedure: Venia Minks DILATION;  Surgeon: Rogene Houston, MD;  [facility];  Service: Endoscopy;  Laterality: N/A;  . SAVORY DILATION N/A 10/01/2013    Procedure: SAVORY DILATION;  Surgeon: Rogene Houston, MD;  [facility];  Service: Endoscopy;  Laterality: N/A;  . SPINE SURGERY  1997   C5-c6 fusion  . TOTAL ABDOMINAL HYSTERECTOMY  1973    Allergies  Allergen Reactions  . Amoxicillin-Pot Clavulanate Hives and Other (See Comments)    Has patient had a PCN reaction causing immediate rash, facial/tongue/throat swelling, SOB or lightheadedness with hypotension: UNKNOWN Has patient had a PCN reaction causing severe rash involving mucus membranes or skin necrosis: UNKNOWN Has patient had a PCN reaction that required hospitalization No Has patient had a PCN reaction occurring within the last 10 years: No If all of the above answers are "NO", then may proceed with Cephalosporin use.   . Byetta 10 Mcg Pen [Exenatide] Other (See Comments)    Unknown  . Ceclor [Cefaclor] Other (See Comments)    Unknown  . Celexa [Citalopram Hydrobromide] Other (See Comments)    Sick  . Ciprofloxacin Nausea Only and Other (See Comments)    Dizziness  . Claritin [Loratadine] Other (See Comments)    Hair fell out  . Erythromycin Other (See Comments)    REACTION: Upset stomach  . Flexeril [Cyclobenzaprine] Other (See Comments)    Nervous  . Fosamax [Alendronate] Other (See Comments)    Made pt sick  . Jardiance [Empagliflozin] Other (See Comments)    Vaginal infection, UTI  . Motrin [Ibuprofen] Nausea Only  . Naproxen Other (See Comments)    REACTION: severe nausea  . Norvasc [Amlodipine Besylate] Other (See Comments)    Unknown  . Prednisone Other (See Comments)    REACTION: itchy rash  . Prozac [Fluoxetine Hcl] Other (See Comments)    Sick  . Sulfamethoxazole-Trimethoprim Nausea And Vomiting and Other (See Comments)    REACTION: Upset stomach    Current Outpatient Medications  Medication Sig Dispense Refill  . Alum Hydroxide-Mag Trisilicate (GAVISCON) 52-77.8 MG CHEW Chew 2-3 tablets by mouth daily as needed (acid reflux).     .  Carboxymeth-Glycerin-Polysorb (REFRESH OPTIVE ADVANCED) 0.5-1-0.5 %  SOLN Apply 1 drop to eye daily as needed (for dry eyes).     . cloNIDine (CATAPRES) 0.1 MG tablet Take 0.1-0.2 mg by mouth See admin instructions. TAKES 0.1 MG IN THE MORNING AND 0.2 MG AT BEDTIME    . esomeprazole (NEXIUM) 20 MG capsule Take 20 mg by mouth daily before breakfast.     . fexofenadine (ALLEGRA) 30 MG/5ML suspension Take 30 mg by mouth daily as needed (for allergies). TAKES CHILDREN'S LIQUID     . hydrALAZINE (APRESOLINE) 25 MG tablet Take 25 mg by mouth daily.     . Insulin Glargine (LANTUS SOLOSTAR) 100 UNIT/ML Solostar Pen INJECT 35 UNITS SUB-Q EVERY DAY AT 10:00 PM. 15 mL 2  . ketotifen (ALAWAY) 0.025 % ophthalmic solution Place 1 drop into both eyes daily as needed (for allergies).     Marland Kitchen levothyroxine (SYNTHROID, LEVOTHROID) 50 MCG tablet Take 50 mcg by mouth daily before breakfast.    . losartan (COZAAR) 100 MG tablet Take 100 mg by mouth daily.      . metFORMIN (GLUCOPHAGE) 500 MG tablet Take 1 tablet (500 mg total) by mouth 2 (two) times daily with a meal. 60 tablet 2  . Methylcellulose, Laxative, (CITRUCEL PO) Take 1 each by mouth every other day.    . Multiple Vitamins-Minerals (PRESERVISION AREDS 2 PO) Take 1-2 tablets by mouth See admin instructions. Take 1 capsule by mouth every other day alternating with 2 capsules by mouth every other day    . NIFEdipine (PROCARDIA-XL/ADALAT-CC/NIFEDICAL-XL) 30 MG 24 hr tablet Take 30 mg by mouth daily.    Marland Kitchen Propylene Glycol (SYSTANE BALANCE OP) Apply 1 drop to eye daily as needed (for dry eyes).     . simvastatin (ZOCOR) 10 MG tablet Take 10 mg by mouth every other day.    . torsemide (DEMADEX) 20 MG tablet Take 20 mg by mouth daily.     . traZODone (DESYREL) 100 MG tablet Take 100 mg by mouth at bedtime.     No current facility-administered medications for this visit.    Facility-Administered Medications Ordered in Other Visits  Medication Dose Route Frequency  Provider Last Rate Last Dose  . vancomycin (VANCOCIN) 1,000 mg in sodium chloride 0.9 % 500 mL IVPB  1,000 mg Intravenous Once Debra Hopkins, Debra Hopkins, Debra Hopkins         REVIEW OF SYSTEMS: See HPI for pertinent positives and negatives.  Physical Examination Vitals:   04/08/17 1351 04/08/17 1353  BP: 130/63 134/76  Pulse: 80   Resp: 18   Temp: (!) 97.3 F (36.3 C)   TempSrc: Oral   SpO2: 96%   Weight: 204 lb (92.5 kg)   Height: 5\' 3"  (1.6 m)    Body mass index is 36.14 kg/m.  General:  Obese female in NAD  Gait: Normal  HENT: No apparent abnormalities  Eyes: PERRLA Pulmonary: normal non-labored breathing, no rales, rhonchi, or wheezing  Cardiac: Regular rhythm and rate, +murmur  Abdomen: soft, NT, no palpable masses  Skin: no rashes, no ulcers, no cellulitis.  VASCULAR EXAM Carotid Bruits Left Right   positive  positive   VASCULAR EXAM: Extremitieswithout ischemic changes,  without Gangrene; without open wounds. All toes are pink with brisk capillary refill.    LE Pulses  LEFT  RIGHT   FEMORAL  2+ palpable 2+ palpable   POPLITEAL  not palpable  not palpable  POSTERIOR TIBIAL  not palpable  not palpable   DORSALIS PEDIS ANTERIOR TIBIAL  not palpable  not  palpable    Musculoskeletal: no muscle wasting or atrophy; arthritic changes in both hands.  Neurologic:A&O X 3; appropriate affect, sensation is normal, MOTOR FUNCTION: 4/5 Symmetric, CN 2-12 intact  Speech is fluent/normal.  Psychiatric: Normal thought content, mood appropriate to clinical situation.    ASSESSMENT:  GAIA GULLIKSON is a 77 y.o. female whois status post left CEA in September 2008, left SFA PTA with stent in 2003. She had a TIA in 1995, none since then.  She was having cramping in left calf after walking about 1/2 block, but she is not walking enough to elicit claudication sx's since about the Summer of 2018 when her fatigue increased. There are no signs of ischemia in her feet or legs.    Her atherosclerotic risk factors include almost in control DM, former smoker, CAD, and obesity.    DATA  Carotid Duplex (04/08/17): Right ICA: 1-39% stenosis Right ECA: >50% stenosis Left ICA: (CEA site), no stenosis Bilateral vertebral artery flow is antegrade.  Bilateral subclavian artery waveforms are normal.  No significant change since the exams on 03-28-15 and 04-02-16.    ABI (Date: 04/08/2017):  R:   ABI: 0.47 (was 0.61 on 04-02-16),   PT: waveform morphology not documented (was mono)  DP: waveform morphology not documented (was mono)  TBI:  0.34 (was 0.37)  Hopkins:   ABI: 0.67 (was 0.77),   PT: waveform morphology not documented (was biphasic)  DP: waveform morphology not documented (was mono)   TBI: 0.42 (was 0.50)  Decline in bilateral ABI with severe disease in the right, moderate in the left. Monophasic waveforms in right leg, mono and biphasic in left.    Plan:  Daily seated leg exercises as discussed and demonstrated.   Based on today's exam and non-invasive vascular lab results, the patient will follow up in 1year with ABI's, Carotid Duplex in 2 years.  I advised her to notify us if she develops concerns re the circulation in her feet or legs.   I discussed in depth with the patient the nature of atherosclerosis, and emphasized the importance of maximal medical management including strict control of blood pressure, blood glucose, and lipid levels, obtaining regular exercise, and continued cessation of smoking.  The patient is aware that without maximal medical management the underlying atherosclerotic disease process will progress, limiting the benefit of any interventions.  The patient was given information about stroke prevention and what symptoms should prompt the patient to seek immediate medical care.  The patient was given information about PAD including signs, symptoms, treatment, what symptoms should prompt the patient to seek immediate  medical care, and risk reduction measures to take.  Thank you for allowing [place] to participate in this patient's care.  Debra Chambers, RN, MSN, FNP-C [facility] Office: [phone]  Clinic MD:  04/08/2017 1:56 PM

## 2017-04-08 NOTE — Patient Instructions (Addendum)
Arterial perfusion to ankles: Right: 47% today, was 61% in January 2018 Left: 67% today, was 77% in January 2018   Peripheral Vascular Disease Peripheral vascular disease (PVD) is a disease of the blood vessels that are not part of your heart and brain. A simple term for PVD is poor circulation. In most cases, PVD narrows the blood vessels that carry blood from your heart to the rest of your body. This can result in a decreased supply of blood to your arms, legs, and internal organs, like your stomach or kidneys. However, it most often affects a person's lower legs and feet. There are two types of PVD.  Organic PVD. This is the more common type. It is caused by damage to the structure of blood vessels.  Functional PVD. This is caused by conditions that make blood vessels contract and tighten (spasm).  Without treatment, PVD tends to get worse over time. PVD can also lead to acute ischemic limb. This is when an arm or limb suddenly has trouble getting enough blood. This is a medical emergency. Follow these instructions at home:  Take medicines only as told by your doctor.  Do not use any tobacco products, including cigarettes, chewing tobacco, or electronic cigarettes. If you need help quitting, ask your doctor.  Lose weight if you are overweight, and maintain a healthy weight as told by your doctor.  Eat a diet that is low in fat and cholesterol. If you need help, ask your doctor.  Exercise regularly. Ask your doctor for some good activities for you.  Take good care of your feet. ? Wear comfortable shoes that fit well. ? Check your feet often for any cuts or sores. Contact a doctor if:  You have cramps in your legs while walking.  You have leg pain when you are at rest.  You have coldness in a leg or foot.  Your skin changes.  You are unable to get or have an erection (erectile dysfunction).  You have cuts or sores on your feet that are not healing. Get help right away  if:  Your arm or leg turns cold and blue.  Your arms or legs become red, warm, swollen, painful, or numb.  You have chest pain or trouble breathing.  You suddenly have weakness in your face, arm, or leg.  You become very confused or you cannot speak.  You suddenly have a very bad headache.  You suddenly cannot see. This information is not intended to replace advice given to you by your health care provider. Make sure you discuss any questions you have with your health care provider. Document Released: 05/22/2009 Document Revised: 08/03/2015 Document Reviewed: 08/05/2013 Elsevier Interactive Patient Education  2017 [employer].     Stroke Prevention Some health problems and behaviors may make it more likely for you to have a stroke. Below are ways to lessen your risk of having a stroke.  Be active for at least 30 minutes on most or all days.  Do not smoke. Try not to be around others who smoke.  Do not drink too much alcohol. ? Do not have more than 2 drinks a day if you are a man. ? Do not have more than 1 drink a day if you are a woman and are not pregnant.  Eat healthy foods, such as fruits and vegetables. If you were put on a specific diet, follow the diet as told.  Keep your cholesterol levels under control through diet and medicines. Look for foods  that are low in saturated fat, trans fat, cholesterol, and are high in fiber.  If you have diabetes, follow all diet plans and take your medicine as told.  Ask your doctor if you need treatment to lower your blood pressure. If you have high blood pressure (hypertension), follow all diet plans and take your medicine as told by your doctor.  If you are 61-8 years old, have your blood pressure checked every 3-5 years. If you are age 40 or older, have your blood pressure checked every year.  Keep a healthy weight. Eat foods that are low in calories, salt, saturated fat, trans fat, and cholesterol.  Do not take  drugs.  Avoid birth control pills, if this applies. Talk to your doctor about the risks of taking birth control pills.  Talk to your doctor if you have sleep problems (sleep apnea).  Take all medicine as told by your doctor. ? You may be told to take aspirin or blood thinner medicine. Take this medicine as told by your doctor. ? Understand your medicine instructions.  Make sure any other conditions you have are being taken care of.  Get help right away if:  You suddenly lose feeling (you feel numb) or have weakness in your face, arm, or leg.  Your face or eyelid hangs down to one side.  You suddenly feel confused.  You have trouble talking (aphasia) or understanding what people are saying.  You suddenly have trouble seeing in one or both eyes.  You suddenly have trouble walking.  You are dizzy.  You lose your balance or your movements are clumsy (uncoordinated).  You suddenly have a very bad headache and you do not know the cause.  You have new chest pain.  Your heart feels like it is fluttering or skipping a beat (irregular heartbeat). Do not wait to see if the symptoms above go away. Get help right away. Call your local emergency services (911 in [place] Do not drive yourself to the hospital. This information is not intended to replace advice given to you by your health care provider. Make sure you discuss any questions you have with your health care provider. Document Released: 08/27/2011 Document Revised: 08/03/2015 Document Reviewed: 08/28/2012 Elsevier Interactive Patient Education  [employer].

## 2017-04-10 ENCOUNTER — Telehealth: Payer: Self-pay

## 2017-04-10 NOTE — Telephone Encounter (Signed)
Pt states she has severe nausea/ heartburn since starting the MTF. She is questioning can she try the extended release once every morning and see if that helps or just one regular release in the a.m.

## 2017-04-10 NOTE — Telephone Encounter (Signed)
Taking metforman not sleeping more then 45 min at night.  Please call patient.

## 2017-04-10 NOTE — Telephone Encounter (Signed)
Yes, she can try with one regular strength in the AM. If she still does not tolerate, will switch to extended release.

## 2017-04-11 NOTE — Telephone Encounter (Signed)
Pt.notified

## 2017-04-25 ENCOUNTER — Other Ambulatory Visit (HOSPITAL_COMMUNITY): Payer: Self-pay | Admitting: Oncology

## 2017-04-25 DIAGNOSIS — R918 Other nonspecific abnormal finding of lung field: Secondary | ICD-10-CM

## 2017-04-25 DIAGNOSIS — R911 Solitary pulmonary nodule: Secondary | ICD-10-CM

## 2017-05-02 ENCOUNTER — Encounter (HOSPITAL_COMMUNITY): Payer: Medicare Other

## 2017-05-02 ENCOUNTER — Encounter (HOSPITAL_COMMUNITY)
Admission: RE | Admit: 2017-05-02 | Discharge: 2017-05-02 | Disposition: A | Discharge status: Home / Self Care | Payer: Medicare Other | Source: Ambulatory Visit | Attending: Oncology | Admitting: Oncology

## 2017-05-02 DIAGNOSIS — R918 Other nonspecific abnormal finding of lung field: Secondary | ICD-10-CM | POA: Diagnosis present

## 2017-05-02 LAB — GLUCOSE, CAPILLARY: Glucose-Capillary: 104 mg/dL — ABNORMAL HIGH (ref 65–99)

## 2017-05-02 MED ORDER — FLUDEOXYGLUCOSE F - 18 (FDG) INJECTION
10.0000 | Freq: Once | INTRAVENOUS | Status: AC | PRN
  Administered 2017-05-02: 10 via INTRAVENOUS

## 2017-06-12 ENCOUNTER — Other Ambulatory Visit: Payer: Self-pay | Admitting: "Endocrinology

## 2017-07-01 ENCOUNTER — Other Ambulatory Visit: Payer: Self-pay | Admitting: "Endocrinology

## 2017-07-02 LAB — COMPREHENSIVE METABOLIC PANEL
A/G RATIO: 2.6 — AB (ref 1.2–2.2)
ALBUMIN: 4.6 g/dL (ref 3.5–4.8)
ALT: 21 IU/L (ref 0–32)
AST: 17 IU/L (ref 0–40)
Alkaline Phosphatase: 76 IU/L (ref 39–117)
BILIRUBIN TOTAL: 0.4 mg/dL (ref 0.0–1.2)
BUN / CREAT RATIO: 29 — AB (ref 12–28)
BUN: 49 mg/dL — ABNORMAL HIGH (ref 8–27)
CHLORIDE: 94 mmol/L — AB (ref 96–106)
CO2: 24 mmol/L (ref 20–29)
Calcium: 9.2 mg/dL (ref 8.7–10.3)
Creatinine, Ser: 1.71 mg/dL — ABNORMAL HIGH (ref 0.57–1.00)
GFR calc Af Amer: 33 mL/min/{1.73_m2} — ABNORMAL LOW (ref >59)
GFR, EST NON AFRICAN AMERICAN: 28 mL/min/{1.73_m2} — AB (ref >59)
GLOBULIN, TOTAL: 1.8 g/dL (ref 1.5–4.5)
Glucose: 190 mg/dL — ABNORMAL HIGH (ref 65–99)
POTASSIUM: 5.7 mmol/L — AB (ref 3.5–5.2)
SODIUM: 134 mmol/L (ref 134–144)
TOTAL PROTEIN: 6.4 g/dL (ref 6.0–8.5)

## 2017-07-02 LAB — SPECIMEN STATUS REPORT

## 2017-07-02 LAB — HGB A1C W/O EAG: Hgb A1c MFr Bld: 6.5 % — ABNORMAL HIGH (ref 4.8–5.6)

## 2017-07-08 ENCOUNTER — Ambulatory Visit (INDEPENDENT_AMBULATORY_CARE_PROVIDER_SITE_OTHER): Payer: Medicare Other | Admitting: "Endocrinology

## 2017-07-08 ENCOUNTER — Encounter: Payer: Self-pay | Admitting: "Endocrinology

## 2017-07-08 VITALS — BP 142/70 | HR 75 | Ht 63.0 in | Wt 203.0 lb

## 2017-07-08 DIAGNOSIS — E782 Mixed hyperlipidemia: Secondary | ICD-10-CM | POA: Diagnosis not present

## 2017-07-08 DIAGNOSIS — N183 Chronic kidney disease, stage 3 unspecified: Secondary | ICD-10-CM

## 2017-07-08 DIAGNOSIS — I10 Essential (primary) hypertension: Secondary | ICD-10-CM | POA: Diagnosis not present

## 2017-07-08 DIAGNOSIS — E1122 Type 2 diabetes mellitus with diabetic chronic kidney disease: Secondary | ICD-10-CM | POA: Diagnosis not present

## 2017-07-08 MED ORDER — INSULIN GLARGINE 100 UNIT/ML SOLOSTAR PEN: PEN_INJECTOR | SUBCUTANEOUS | 2 refills | Status: DC

## 2017-07-08 NOTE — Patient Instructions (Signed)

## 2017-07-08 NOTE — Progress Notes (Signed)
Subjective:    Patient ID: Debra Hopkins, female    DOB: Dec 31, 1940. Patient is being seen in f/u for management of diabetes requested by  Monico Blitz, MD  Past Medical History:  Diagnosis Date  . Arthritis   . CAD (coronary artery disease)   . Carotid artery occlusion   . Cataracts, bilateral   . DDD (degenerative disc disease)   . Diabetes mellitus    Type 2  . Diverticulitis   . Dyspnea   . Fibromyalgia   . GERD (gastroesophageal reflux disease)   . Heart murmur   . Hemorrhoids   . History of shingles   . Impaired hearing   . Macular degeneration   . Other and unspecified hyperlipidemia   . PVD (peripheral vascular disease) ([place])   . TIA (transient ischemic attack)   . Unspecified essential hypertension   . Varicose veins    Past Surgical History:  Procedure Laterality Date  . ANGIOPLASTY     left leg 2003  . APPENDECTOMY    . aspiration of a cyst in the right breast    . BALLOON DILATION N/A 10/01/2013   Procedure: BALLOON DILATION;  Surgeon: Rogene Houston, MD;  [facility];  Service: Endoscopy;  Laterality: N/A;  . BREAST BIOPSY    . BREAST BIOPSY Left   . CARDIAC CATHETERIZATION    . CAROTID ENDARTERECTOMY  12-03-06   left CEA  . CATARACT EXTRACTION W/PHACO Right 10/14/2013   Procedure: CATARACT EXTRACTION PHACO AND INTRAOCULAR LENS PLACEMENT (IOC);  Surgeon: Tonny Branch, MD;  Location: [facility];  Service: Ophthalmology;  Laterality: Right;  CDE 7.20  . CATARACT EXTRACTION W/PHACO Left 11/04/2013   Procedure: CATARACT EXTRACTION PHACO AND INTRAOCULAR LENS PLACEMENT (IOC);  Surgeon: Tonny Branch, MD;  Location: [facility];  Service: Ophthalmology;  Laterality: Left;  CDE 8.57  . COLONOSCOPY N/A 09/19/2014   Procedure: COLONOSCOPY;  Surgeon: Rogene Houston, MD;  [facility];  Service: Endoscopy;  Laterality: N/A;  7:30-8:30  . CYSTO WITH HYDRODISTENSION N/A 03/29/2016   Procedure: CYSTOSCOPY/HYDRODISTENSION;  Surgeon: Irine Seal, MD;  Location: [facility];   Service: Urology;  Laterality: N/A;  . CYSTOSCOPY WITH INJECTION N/A 03/29/2016   Procedure: CYSTOSCOPY WITH PYRIDIUM AND MARCAINE INSTALLATION;  Surgeon: Irine Seal, MD;  Location: [facility];  Service: Urology;  Laterality: N/A;  . ESOPHAGOGASTRODUODENOSCOPY N/A 10/01/2013   Procedure: ESOPHAGOGASTRODUODENOSCOPY (EGD);  Surgeon: Rogene Houston, MD;  [facility];  Service: Endoscopy;  Laterality: N/A;  120  . ESOPHAGOGASTRODUODENOSCOPY N/A 10/24/2014   Procedure: ESOPHAGOGASTRODUODENOSCOPY (EGD);  Surgeon: Rogene Houston, MD;  [facility];  Service: Endoscopy;  Laterality: N/A;  730  . GIVENS CAPSULE STUDY N/A 10/24/2014   Procedure: GIVENS CAPSULE STUDY;  Surgeon: Rogene Houston, MD;  [facility];  Service: Endoscopy;  Laterality: N/A;  . JOINT REPLACEMENT  2011   Right total knee replacement  . [place]  . KNEE ARTHROSCOPY  2009  . MALONEY DILATION N/A 10/01/2013   Procedure: Venia Minks DILATION;  Surgeon: Rogene Houston, MD;  [facility];  Service: Endoscopy;  Laterality: N/A;  . SAVORY DILATION N/A 10/01/2013   Procedure: SAVORY DILATION;  Surgeon: Rogene Houston, MD;  [facility];  Service: Endoscopy;  Laterality: N/A;  . SPINE SURGERY  1997   C5-c6 fusion  . TOTAL ABDOMINAL HYSTERECTOMY  1973   Social History   Socioeconomic History  . Marital  status: Widowed    Spouse name: Not on file  . Number of children: Not on file  . Years of education: Not on file  . Highest education level: Not on file  Occupational History  . Not on file  Social Needs  . Financial resource strain: Not on file  . Food insecurity:    Worry: Not on file    Inability: Not on file  . Transportation needs:    Medical: Not on file    Non-medical: Not on file  Tobacco Use  . Smoking status: Former Smoker    Types: Cigarettes    Last attempt to quit: 03/11/1985    Years since quitting: 32.3  . Smokeless tobacco: Never Used   Substance and Sexual Activity  . Alcohol use: No    Alcohol/week: 0.0 oz  . Drug use: No  . Sexual activity: Not on file  Lifestyle  . Physical activity:    Days per week: Not on file    Minutes per session: Not on file  . Stress: Not on file  Relationships  . Social connections:    Talks on phone: Not on file    Gets together: Not on file    Attends religious service: Not on file    Active member of club or organization: Not on file    Attends meetings of clubs or organizations: Not on file    Relationship status: Not on file  Other Topics Concern  . Not on file  Social History Narrative   Retired. Married.    Outpatient Encounter Medications as of 07/08/2017  Medication Sig  . Alum Hydroxide-Mag Trisilicate (GAVISCON) 32-35.5 MG CHEW Chew 2-3 tablets by mouth daily as needed (acid reflux).   . Carboxymeth-Glycerin-Polysorb (REFRESH OPTIVE ADVANCED) 0.5-1-0.5 % SOLN Apply 1 drop to eye daily as needed (for dry eyes).   . cloNIDine (CATAPRES) 0.1 MG tablet Take 0.1-0.2 mg by mouth See admin instructions. TAKES 0.1 MG IN THE MORNING AND 0.2 MG AT BEDTIME  . esomeprazole (NEXIUM) 20 MG capsule Take 20 mg by mouth daily before breakfast.   . fexofenadine (ALLEGRA) 30 MG/5ML suspension Take 30 mg by mouth daily as needed (for allergies). TAKES CHILDREN'S LIQUID   . hydrALAZINE (APRESOLINE) 25 MG tablet Take 25 mg by mouth daily.   . Insulin Glargine (LANTUS SOLOSTAR) 100 UNIT/ML Solostar Pen INJECT 40 UNITS SUB-Q EVERY DAY AT 10:00 PM.  . ketotifen (ALAWAY) 0.025 % ophthalmic solution Place 1 drop into both eyes daily as needed (for allergies).   Marland Kitchen levothyroxine (SYNTHROID, LEVOTHROID) 50 MCG tablet Take 50 mcg by mouth daily before breakfast.  . losartan (COZAAR) 100 MG tablet Take 100 mg by mouth daily.    . Methylcellulose, Laxative, (CITRUCEL PO) Take 1 each by mouth every other day.  . Multiple Vitamins-Minerals (PRESERVISION AREDS 2 PO) Take 1-2 tablets by mouth See admin  instructions. Take 1 capsule by mouth every other day alternating with 2 capsules by mouth every other day  . NIFEdipine (PROCARDIA-XL/ADALAT-CC/NIFEDICAL-XL) 30 MG 24 hr tablet Take 30 mg by mouth daily.  Marland Kitchen Propylene Glycol (SYSTANE BALANCE OP) Apply 1 drop to eye daily as needed (for dry eyes).   . simvastatin (ZOCOR) 10 MG tablet Take 10 mg by mouth every other day.  . torsemide (DEMADEX) 20 MG tablet Take 20 mg by mouth daily.   . traZODone (DESYREL) 100 MG tablet Take 100 mg by mouth at bedtime.  . [DISCONTINUED] Insulin Glargine (LANTUS SOLOSTAR) 100 UNIT/ML Solostar Pen  INJECT 35 UNITS SUB-Q EVERY DAY AT 10:00 PM.  . [DISCONTINUED] metFORMIN (GLUCOPHAGE) 500 MG tablet TAKE ONE TABLET BY MOUTH TWICE DAILY with a meal   Facility-Administered Encounter Medications as of 07/08/2017  Medication  . vancomycin (VANCOCIN) 1,000 mg in sodium chloride 0.9 % 500 mL IVPB   ALLERGIES: Allergies  Allergen Reactions  . Amoxicillin-Pot Clavulanate Hives and Other (See Comments)    Has patient had a PCN reaction causing immediate rash, facial/tongue/throat swelling, SOB or lightheadedness with hypotension: UNKNOWN Has patient had a PCN reaction causing severe rash involving mucus membranes or skin necrosis: UNKNOWN Has patient had a PCN reaction that required hospitalization No Has patient had a PCN reaction occurring within the last 10 years: No If all of the above answers are "NO", then may proceed with Cephalosporin use.   . Byetta 10 Mcg Pen [Exenatide] Other (See Comments)    Unknown  . Ceclor [Cefaclor] Other (See Comments)    Unknown  . Celexa [Citalopram Hydrobromide] Other (See Comments)    Sick  . Ciprofloxacin Nausea Only and Other (See Comments)    Dizziness  . Claritin [Loratadine] Other (See Comments)    Hair fell out  . Erythromycin Other (See Comments)    REACTION: Upset stomach  . Flexeril [Cyclobenzaprine] Other (See Comments)    Nervous  . Fosamax [Alendronate] Other (See  Comments)    Made pt sick  . Jardiance [Empagliflozin] Other (See Comments)    Vaginal infection, UTI  . Motrin [Ibuprofen] Nausea Only  . Naproxen Other (See Comments)    REACTION: severe nausea  . Norvasc [Amlodipine Besylate] Other (See Comments)    Unknown  . Prednisone Other (See Comments)    REACTION: itchy rash  . Prozac [Fluoxetine Hcl] Other (See Comments)    Sick  . Sulfamethoxazole-Trimethoprim Nausea And Vomiting and Other (See Comments)    REACTION: Upset stomach   VACCINATION STATUS:  There is no immunization history on file for this patient.  Diabetes  She presents for her follow-up diabetic visit. She has type 2 diabetes mellitus. Onset time: She was diagnosed at approximate age of 80 years. Her disease course has been improving. There are no hypoglycemic associated symptoms. Pertinent negatives for hypoglycemia include no confusion, headaches, pallor or seizures. Associated symptoms include fatigue. Pertinent negatives for diabetes include no chest pain, no polydipsia, no polyphagia and no polyuria. There are no hypoglycemic complications. Symptoms are improving. Diabetic complications include nephropathy. Risk factors for coronary artery disease include dyslipidemia, diabetes mellitus, hypertension, obesity, sedentary lifestyle and tobacco exposure (She smoked for 28 years before she quit.). Current diabetic treatment includes insulin injections. Her weight is stable. She is following a generally unhealthy diet. When asked about meal planning, she reported none. She has not had a previous visit with a dietitian. She rarely participates in exercise. Her home blood glucose trend is decreasing steadily. Her breakfast blood glucose range is generally 140-180 mg/dl. Her overall blood glucose range is 140-180 mg/dl. An ACE inhibitor/angiotensin II receptor blocker is being taken. Eye exam is current.  Hyperlipidemia  This is a chronic problem. The current episode started more than  1 year ago. The problem is controlled. Recent lipid tests were reviewed and are variable. Exacerbating diseases include diabetes, hypothyroidism and obesity. Pertinent negatives include no chest pain, myalgias or shortness of breath. Current antihyperlipidemic treatment includes statins. Risk factors for coronary artery disease include diabetes mellitus, dyslipidemia, hypertension, family history, obesity and a sedentary lifestyle.  Hypertension  This is a  chronic problem. The current episode started more than 1 year ago. The problem is controlled. Pertinent negatives include no chest pain, headaches, palpitations or shortness of breath. Risk factors for coronary artery disease include diabetes mellitus, dyslipidemia, obesity, sedentary lifestyle and smoking/tobacco exposure. Past treatments include angiotensin blockers. Hypertensive end-organ damage includes kidney disease.    Review of Systems  Constitutional: Positive for fatigue. Negative for unexpected weight change.  HENT: Negative for trouble swallowing and voice change.   Eyes: Negative for visual disturbance.  Respiratory: Negative for cough, shortness of breath and wheezing.   Cardiovascular: Negative for chest pain, palpitations and leg swelling.  Gastrointestinal: Negative for diarrhea, nausea and vomiting.  Endocrine: Negative for cold intolerance, heat intolerance, polydipsia, polyphagia and polyuria.  Genitourinary: Positive for frequency. Negative for dysuria and flank pain.  Musculoskeletal: Negative for arthralgias and myalgias.  Skin: Negative for color change, pallor, rash and wound.  Neurological: Negative for seizures and headaches.  Psychiatric/Behavioral: Negative for confusion and suicidal ideas.    Objective:    BP (!) 142/70   Pulse 75   Ht 5\' 3"  (1.6 m)   Wt 203 lb (92.1 kg)   BMI 35.96 kg/m   Wt Readings from Last 3 Encounters:  07/08/17 203 lb (92.1 kg)  04/08/17 204 lb (92.5 kg)  04/03/17 204 lb (92.5 kg)     Physical Exam  Constitutional: She is oriented to person, place, and time. She appears well-developed.  HENT:  Head: Normocephalic and atraumatic.  Eyes: EOM are normal.  Neck: Normal range of motion. Neck supple. No tracheal deviation present. No thyromegaly present.  Cardiovascular: Normal rate.  Pulmonary/Chest: Effort normal.  Abdominal: There is no tenderness. There is no guarding.  Musculoskeletal: Normal range of motion. She exhibits no edema.  Neurological: She is alert and oriented to person, place, and time. She has normal reflexes. No cranial nerve deficit. Coordination normal.  Skin: Skin is warm and dry. No rash noted. No erythema. No pallor.  Psychiatric: She has a normal mood and affect. Judgment normal.    CMP     Component Value Date/Time   NA 134 07/01/2017 1059   K 5.7 (H) 07/01/2017 1059   CL 94 (L) 07/01/2017 1059   CO2 24 07/01/2017 1059   GLUCOSE 190 (H) 07/01/2017 1059   GLUCOSE 246 (H) 12/18/2016 1508   BUN 49 (H) 07/01/2017 1059   CREATININE 1.71 (H) 07/01/2017 1059   CALCIUM 9.2 07/01/2017 1059   PROT 6.4 07/01/2017 1059   ALBUMIN 4.6 07/01/2017 1059   AST 17 07/01/2017 1059   ALT 21 07/01/2017 1059   ALKPHOS 76 07/01/2017 1059   BILITOT 0.4 07/01/2017 1059   GFRNONAA 28 (L) 07/01/2017 1059   GFRAA 33 (L) 07/01/2017 1059   Recent Results (from the past 2160 hour(s))  Glucose, capillary     Status: Abnormal   Collection Time: 05/02/17  1:27 PM  Result Value Ref Range   Glucose-Capillary 104 (H) 65 - 99 mg/dL  Comprehensive metabolic panel     Status: Abnormal   Collection Time: 07/01/17 10:59 AM  Result Value Ref Range   Glucose 190 (H) 65 - 99 mg/dL   BUN 49 (H) 8 - 27 mg/dL   Creatinine, Ser 1.71 (H) 0.57 - 1.00 mg/dL   GFR calc non Af Amer 28 (L) >59 mL/min/1.73   GFR calc Af Amer 33 (L) >59 mL/min/1.73   BUN/Creatinine Ratio 29 (H) 12 - 28   Sodium 134 134 - 144 mmol/L  Potassium 5.7 (H) 3.5 - 5.2 mmol/L   Chloride 94 (L) 96 -  106 mmol/L   CO2 24 20 - 29 mmol/L   Calcium 9.2 8.7 - 10.3 mg/dL   Total Protein 6.4 6.0 - 8.5 g/dL   Albumin 4.6 3.5 - 4.8 g/dL   Globulin, Total 1.8 1.5 - 4.5 g/dL   Albumin/Globulin Ratio 2.6 (H) 1.2 - 2.2   Bilirubin Total 0.4 0.0 - 1.2 mg/dL   Alkaline Phosphatase 76 39 - 117 IU/L   AST 17 0 - 40 IU/L   ALT 21 0 - 32 IU/L  Hgb A1c w/o eAG     Status: Abnormal   Collection Time: 07/01/17 10:59 AM  Result Value Ref Range   Hgb A1c MFr Bld 6.5 (H) 4.8 - 5.6 %    Comment:          Prediabetes: 5.7 - 6.4          Diabetes: >6.4          Glycemic control for adults with diabetes: <7.0   Specimen status report     Status: None   Collection Time: 07/01/17 10:59 AM  Result Value Ref Range   specimen status report Comment     Comment: Is[facility] Caddy CMP14 Default Ambig Abbrev CMP14 Default A hand-written panel/profile was received from your office. In accordance with the [facility] Ambiguous Test Code Policy dated July 8416, we have completed your order by using the closest currently or formerly recognized AMA panel.  We have assigned Comprehensive Metabolic Panel (14), Test Code #322000 to this request.  If this is not the testing you wished to receive on this specimen, please contact the [place] Client Inquiry/Technical Services Department to clarify the test order.  We appreciate your business.    Lipid Panel     Component Value Date/Time   CHOL 146 08/04/2015   TRIG 199 (A) 08/04/2015   HDL 37 08/04/2015   LDLCALC 69 08/04/2015    Assessment & Plan:   1. Type 2 diabetes mellitus with stage 3 chronic kidney disease, without long-term current use of insulin ([place])  - Patient has currently uncontrolled symptomatic type 2 DM since  77 years of age. - She came with near target blood gluco at fasting with stable A1c of 6.5%, overall improving from 8.4%.  - Recent labs reviewed.  - Of note, she has chronic blood loss anemia with hemoglobin at 10.8 which may underestimate her  glycemic burden.  Her average blood glucose is between 140 and 170.   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 These are all discussed in detail with the patient.  - I have counseled the patient on diet management and weight loss, by adopting a carbohydrate restricted/protein rich diet.  -  Suggestion is made for her to avoid simple carbohydrates  from her diet including Cakes, Sweet Desserts / Pastries, Ice Cream, Soda (diet and regular), Sweet Tea, Candies, Chips, Cookies, Store Bought Juices, Alcohol in Excess of  1-2 drinks a day, Artificial Sweeteners, and "Sugar-free" Products. This will help patient to have stable blood glucose profile and potentially avoid unintended weight gain.  - I encouraged the patient to switch to  unprocessed or minimally processed complex starch and increased protein intake (animal or plant source), fruits, and vegetables.  - Patient is advised to stick to  a routine mealtimes to eat 3 meals  a day and avoid unnecessary snacks ( to snack only to correct hypoglycemia).   - I have approached patient with the following individualized plan to manage diabetes and patient agrees:   - She will  continue to need at least basal  insulin treatment. -Since she became comfortable using insulin, I approached her to increase her basal insulin Lantus to 40 units nightly, associated with monitoring of blood glucose twice a day before breakfast and at bedtime.  -Due to unfavorable renal function, I advised her to discontinue metformin.   -Reportedly, she did not see a nephrologist before.  She is a Cytogeneticist.  I advised her to seek nephrology referral during her next visit with her PMD in 1 week.   - She is not a candidate for metformin, SGLT2 inhibitors  at this time. -She may benefit from incretin  therapy, however she is hesitant to add more medications at this time. - Patient specific target  A1c;  LDL, HDL, Triglycerides, and  Waist Circumference were discussed in detail.  2) BP/HTN:   her blood pressure is not controlled to target.  Continue current medications including ACEI/ARB.  3) Lipids/HPL: controlled with LDL of 69.  I have advised her to continue her simvastatin 10 mg p.o. nightly.  Side effects and precautions discussed with her.  4)  Weight/Diet: CDE Consult is in progress  , exercise, and detailed carbohydrates information provided.  5) Chronic Care/Health Maintenance:  -Patient is on ACEI/ARB and Statin medications and encouraged to continue to follow up with Ophthalmology, Podiatrist at least yearly or according to recommendations, and advised to   stay away from smoking. I have recommended yearly flu vaccine and pneumonia vaccination at least every 5 years; moderate intensity exercise for up to 150 minutes weekly; and  sleep for at least 7 hours a day.  - I advised patient to maintain close follow up with Monico Blitz, MD for primary care needs.  - Time spent with the patient: 25 min, of which >50% was spent in reviewing her blood glucose logs , discussing her hypo- and hyper-glycemic episodes, reviewing her current and  previous labs and insulin doses and developing a plan to avoid hypo- and hyper-glycemia. Please refer to Patient Instructions for Blood Glucose Monitoring and Insulin/Medications Dosing Guide"  in media tab for additional information. Rexene Edison participated in the discussions, expressed understanding, and voiced agreement with the above plans.  All questions were answered to her satisfaction. she is encouraged to contact clinic should she have any questions or concerns prior to her return visit.  Follow up plan: - Return in about 4 months (around 11/07/2017) for follow up with pre-visit labs, meter, and logs.  Glade Lloyd, MD Phone: [phone]  Fax:  [phone]   -  This note was partially dictated with voice recognition software. Similar sounding words can be transcribed inadequately or may not  be corrected upon review.  07/08/2017, 3:42 PM

## 2017-07-24 ENCOUNTER — Other Ambulatory Visit: Payer: Self-pay | Admitting: "Endocrinology

## 2017-09-01 ENCOUNTER — Other Ambulatory Visit (HOSPITAL_COMMUNITY): Payer: Self-pay | Admitting: Medical

## 2017-09-01 DIAGNOSIS — N183 Chronic kidney disease, stage 3 unspecified: Secondary | ICD-10-CM

## 2017-09-11 ENCOUNTER — Other Ambulatory Visit: Payer: Self-pay | Admitting: "Endocrinology

## 2017-09-15 ENCOUNTER — Ambulatory Visit (HOSPITAL_COMMUNITY): Payer: Medicare Other

## 2017-09-17 ENCOUNTER — Ambulatory Visit (HOSPITAL_COMMUNITY)
Admission: RE | Admit: 2017-09-17 | Discharge: 2017-09-17 | Disposition: A | Discharge status: Home / Self Care | Payer: Medicare Other | Source: Ambulatory Visit | Attending: Medical | Admitting: Medical

## 2017-09-17 DIAGNOSIS — N261 Atrophy of kidney (terminal): Secondary | ICD-10-CM | POA: Diagnosis not present

## 2017-09-17 DIAGNOSIS — N183 Chronic kidney disease, stage 3 unspecified: Secondary | ICD-10-CM

## 2017-10-30 ENCOUNTER — Other Ambulatory Visit: Payer: Self-pay | Admitting: "Endocrinology

## 2017-10-31 ENCOUNTER — Other Ambulatory Visit: Payer: Self-pay | Admitting: "Endocrinology

## 2017-10-31 LAB — COMPREHENSIVE METABOLIC PANEL
A/G RATIO: 2.9 — AB (ref 1.2–2.2)
ALT: 16 IU/L (ref 0–32)
AST: 16 IU/L (ref 0–40)
Albumin: 4.7 g/dL (ref 3.5–4.8)
Alkaline Phosphatase: 77 IU/L (ref 39–117)
BILIRUBIN TOTAL: 0.4 mg/dL (ref 0.0–1.2)
BUN/Creatinine Ratio: 21 (ref 12–28)
BUN: 28 mg/dL — AB (ref 8–27)
CHLORIDE: 99 mmol/L (ref 96–106)
CO2: 21 mmol/L (ref 20–29)
Calcium: 9.3 mg/dL (ref 8.7–10.3)
Creatinine, Ser: 1.31 mg/dL — ABNORMAL HIGH (ref 0.57–1.00)
GFR calc Af Amer: 45 mL/min/{1.73_m2} — ABNORMAL LOW (ref >59)
GFR calc non Af Amer: 39 mL/min/{1.73_m2} — ABNORMAL LOW (ref >59)
GLUCOSE: 159 mg/dL — AB (ref 65–99)
Globulin, Total: 1.6 g/dL (ref 1.5–4.5)
POTASSIUM: 5.3 mmol/L — AB (ref 3.5–5.2)
Sodium: 134 mmol/L (ref 134–144)
Total Protein: 6.3 g/dL (ref 6.0–8.5)

## 2017-10-31 LAB — SPECIMEN STATUS REPORT

## 2017-10-31 LAB — HGB A1C W/O EAG: HEMOGLOBIN A1C: 6.5 % — AB (ref 4.8–5.6)

## 2017-11-06 ENCOUNTER — Encounter: Payer: Self-pay | Admitting: "Endocrinology

## 2017-11-06 ENCOUNTER — Ambulatory Visit (INDEPENDENT_AMBULATORY_CARE_PROVIDER_SITE_OTHER): Payer: Medicare Other | Admitting: "Endocrinology

## 2017-11-06 VITALS — BP 127/66 | HR 79 | Ht 63.0 in | Wt 199.0 lb

## 2017-11-06 DIAGNOSIS — E038 Other specified hypothyroidism: Secondary | ICD-10-CM | POA: Diagnosis not present

## 2017-11-06 DIAGNOSIS — N183 Chronic kidney disease, stage 3 unspecified: Secondary | ICD-10-CM

## 2017-11-06 DIAGNOSIS — I10 Essential (primary) hypertension: Secondary | ICD-10-CM | POA: Diagnosis not present

## 2017-11-06 DIAGNOSIS — E782 Mixed hyperlipidemia: Secondary | ICD-10-CM

## 2017-11-06 DIAGNOSIS — E1122 Type 2 diabetes mellitus with diabetic chronic kidney disease: Secondary | ICD-10-CM

## 2017-11-06 NOTE — Progress Notes (Signed)
Endocrinology follow-up note  Subjective:    Patient ID: Debra Hopkins, female    DOB: 13-Aug-1940. Patient is being seen in follow up for management of chronic and uncontrolled type 2 diabetes, hyperlipidemia, hypertension.   PMD:   Monico Blitz, MD  Past Medical History:  Diagnosis Date  . Arthritis   . CAD (coronary artery disease)   . Carotid artery occlusion   . Cataracts, bilateral   . DDD (degenerative disc disease)   . Diabetes mellitus    Type 2  . Diverticulitis   . Dyspnea   . Fibromyalgia   . GERD (gastroesophageal reflux disease)   . Heart murmur   . Hemorrhoids   . History of shingles   . Impaired hearing   . Macular degeneration   . Other and unspecified hyperlipidemia   . PVD (peripheral vascular disease) ([place])   . TIA (transient ischemic attack)   . Unspecified essential hypertension   . Varicose veins    Past Surgical History:  Procedure Laterality Date  . ANGIOPLASTY     left leg 2003  . APPENDECTOMY    . aspiration of a cyst in the right breast    . BALLOON DILATION N/A 10/01/2013   Procedure: BALLOON DILATION;  Surgeon: Rogene Houston, MD;  [facility];  Service: Endoscopy;  Laterality: N/A;  . BREAST BIOPSY    . BREAST BIOPSY Left   . CARDIAC CATHETERIZATION    . CAROTID ENDARTERECTOMY  12-03-06   left CEA  . CATARACT EXTRACTION W/PHACO Right 10/14/2013   Procedure: CATARACT EXTRACTION PHACO AND INTRAOCULAR LENS PLACEMENT (IOC);  Surgeon: Tonny Branch, MD;  Location: [facility];  Service: Ophthalmology;  Laterality: Right;  CDE 7.20  . CATARACT EXTRACTION W/PHACO Left 11/04/2013   Procedure: CATARACT EXTRACTION PHACO AND INTRAOCULAR LENS PLACEMENT (IOC);  Surgeon: Tonny Branch, MD;  Location: [facility];  Service: Ophthalmology;  Laterality: Left;  CDE 8.57  . COLONOSCOPY N/A 09/19/2014   Procedure: COLONOSCOPY;  Surgeon: Rogene Houston, MD;  [facility];  Service: Endoscopy;  Laterality: N/A;  7:30-8:30  . CYSTO WITH HYDRODISTENSION N/A  03/29/2016   Procedure: CYSTOSCOPY/HYDRODISTENSION;  Surgeon: Irine Seal, MD;  Location: [facility];  Service: Urology;  Laterality: N/A;  . CYSTOSCOPY WITH INJECTION N/A 03/29/2016   Procedure: CYSTOSCOPY WITH PYRIDIUM AND MARCAINE INSTALLATION;  Surgeon: Irine Seal, MD;  Location: [facility];  Service: Urology;  Laterality: N/A;  . ESOPHAGOGASTRODUODENOSCOPY N/A 10/01/2013   Procedure: ESOPHAGOGASTRODUODENOSCOPY (EGD);  Surgeon: Rogene Houston, MD;  [facility];  Service: Endoscopy;  Laterality: N/A;  120  . ESOPHAGOGASTRODUODENOSCOPY N/A 10/24/2014   Procedure: ESOPHAGOGASTRODUODENOSCOPY (EGD);  Surgeon: Rogene Houston, MD;  [facility];  Service: Endoscopy;  Laterality: N/A;  730  . GIVENS CAPSULE STUDY N/A 10/24/2014   Procedure: GIVENS CAPSULE STUDY;  Surgeon: Rogene Houston, MD;  [facility];  Service: Endoscopy;  Laterality: N/A;  . JOINT REPLACEMENT  2011   Right total knee replacement  . [place]  . KNEE ARTHROSCOPY  2009  . MALONEY DILATION N/A 10/01/2013   Procedure: Venia Minks DILATION;  Surgeon: Rogene Houston, MD;  [facility];  Service: Endoscopy;  Laterality: N/A;  . SAVORY DILATION N/A 10/01/2013   Procedure: SAVORY DILATION;  Surgeon: Rogene Houston, MD;  [facility];  Service: Endoscopy;  Laterality: N/A;  . SPINE SURGERY  1997   C5-c6 fusion  . TOTAL ABDOMINAL  HYSTERECTOMY  1973   Social History   Socioeconomic History  . Marital status: Widowed    Spouse name: Not on file  . Number of children: Not on file  . Years of education: Not on file  . Highest education level: Not on file  Occupational History  . Not on file  Social Needs  . Financial resource strain: Not on file  . Food insecurity:    Worry: Not on file    Inability: Not on file  . Transportation needs:    Medical: Not on file    Non-medical: Not on file  Tobacco Use  . Smoking status: Former Smoker    Types: Cigarettes    Last  attempt to quit: 03/11/1985    Years since quitting: 32.6  . Smokeless tobacco: Never Used  Substance and Sexual Activity  . Alcohol use: No    Alcohol/week: 0.0 standard drinks  . Drug use: No  . Sexual activity: Not on file  Lifestyle  . Physical activity:    Days per week: Not on file    Minutes per session: Not on file  . Stress: Not on file  Relationships  . Social connections:    Talks on phone: Not on file    Gets together: Not on file    Attends religious service: Not on file    Active member of club or organization: Not on file    Attends meetings of clubs or organizations: Not on file    Relationship status: Not on file  Other Topics Concern  . Not on file  Social History Narrative   Retired. Married.    Outpatient Encounter Medications as of 11/06/2017  Medication Sig  . Alum Hydroxide-Mag Trisilicate (GAVISCON) 51-76.1 MG CHEW Chew 2-3 tablets by mouth daily as needed (acid reflux).   . Carboxymeth-Glycerin-Polysorb (REFRESH OPTIVE ADVANCED) 0.5-1-0.5 % SOLN Apply 1 drop to eye daily as needed (for dry eyes).   . cloNIDine (CATAPRES) 0.1 MG tablet Take 0.1-0.2 mg by mouth See admin instructions. TAKES 0.1 MG IN THE MORNING AND 0.2 MG AT BEDTIME  . Darbepoetin Alfa 60 MCG/ML SOLN Inject into the skin every 30 (thirty) days.  Marland Kitchen esomeprazole (NEXIUM) 20 MG capsule Take 20 mg by mouth daily before breakfast.   . fexofenadine (ALLEGRA) 30 MG/5ML suspension Take 30 mg by mouth daily as needed (for allergies). TAKES CHILDREN'S LIQUID   . GLOBAL EASE INJECT PEN NEEDLES 32G X 4 MM MISC USE ONCE DAILY WITH SOLOSTAR INSULIN  . hydrALAZINE (APRESOLINE) 25 MG tablet Take 25 mg by mouth daily.   . Insulin Glargine (LANTUS SOLOSTAR) 100 UNIT/ML Solostar Pen INJECT 40 UNITS SUB-Q EVERY DAY AT 10:00 PM.  . Insulin Glargine (LANTUS SOLOSTAR) 100 UNIT/ML Solostar Pen INJECT 40 UNITS INTO THE SKIN EVERY DAY AT 10 IN THE EVENING  . ketotifen (ALAWAY) 0.025 % ophthalmic solution Place 1 drop  into both eyes daily as needed (for allergies).   Marland Kitchen levothyroxine (SYNTHROID, LEVOTHROID) 50 MCG tablet Take 50 mcg by mouth daily before breakfast.  . losartan (COZAAR) 100 MG tablet Take 100 mg by mouth daily.    . Methylcellulose, Laxative, (CITRUCEL PO) Take 1 each by mouth every other day.  . Multiple Vitamins-Minerals (PRESERVISION AREDS 2 PO) Take 1-2 tablets by mouth See admin instructions. Take 1 capsule by mouth every other day alternating with 2 capsules by mouth every other day  . NIFEdipine (PROCARDIA-XL/ADALAT-CC/NIFEDICAL-XL) 30 MG 24 hr tablet Take 30 mg by mouth daily.  Marland Kitchen  Propylene Glycol (SYSTANE BALANCE OP) Apply 1 drop to eye daily as needed (for dry eyes).   . simvastatin (ZOCOR) 10 MG tablet Take 10 mg by mouth every other day.  . torsemide (DEMADEX) 20 MG tablet Take 20 mg by mouth daily.   . traZODone (DESYREL) 100 MG tablet Take 100 mg by mouth at bedtime.   Facility-Administered Encounter Medications as of 11/06/2017  Medication  . vancomycin (VANCOCIN) 1,000 mg in sodium chloride 0.9 % 500 mL IVPB   ALLERGIES: Allergies  Allergen Reactions  . Amoxicillin-Pot Clavulanate Hives and Other (See Comments)    Has patient had a PCN reaction causing immediate rash, facial/tongue/throat swelling, SOB or lightheadedness with hypotension: UNKNOWN Has patient had a PCN reaction causing severe rash involving mucus membranes or skin necrosis: UNKNOWN Has patient had a PCN reaction that required hospitalization No Has patient had a PCN reaction occurring within the last 10 years: No If all of the above answers are "NO", then may proceed with Cephalosporin use.   . Byetta 10 Mcg Pen [Exenatide] Other (See Comments)    Unknown  . Ceclor [Cefaclor] Other (See Comments)    Unknown  . Celexa [Citalopram Hydrobromide] Other (See Comments)    Sick  . Ciprofloxacin Nausea Only and Other (See Comments)    Dizziness  . Claritin [Loratadine] Other (See Comments)    Hair fell out  .  Erythromycin Other (See Comments)    REACTION: Upset stomach  . Flexeril [Cyclobenzaprine] Other (See Comments)    Nervous  . Fosamax [Alendronate] Other (See Comments)    Made pt sick  . Jardiance [Empagliflozin] Other (See Comments)    Vaginal infection, UTI  . Motrin [Ibuprofen] Nausea Only  . Naproxen Other (See Comments)    REACTION: severe nausea  . Norvasc [Amlodipine Besylate] Other (See Comments)    Unknown  . Prednisone Other (See Comments)    REACTION: itchy rash  . Prozac [Fluoxetine Hcl] Other (See Comments)    Sick  . Sulfamethoxazole-Trimethoprim Nausea And Vomiting and Other (See Comments)    REACTION: Upset stomach   VACCINATION STATUS:  There is no immunization history on file for this patient.  Diabetes  She presents for her follow-up diabetic visit. She has type 2 diabetes mellitus. Onset time: She was diagnosed at approximate age of 76 years. Her disease course has been improving. There are no hypoglycemic associated symptoms. Pertinent negatives for hypoglycemia include no confusion, headaches, pallor or seizures. Associated symptoms include fatigue. Pertinent negatives for diabetes include no chest pain, no polydipsia, no polyphagia and no polyuria. There are no hypoglycemic complications. Symptoms are improving. Diabetic complications include nephropathy. Risk factors for coronary artery disease include dyslipidemia, diabetes mellitus, hypertension, obesity, sedentary lifestyle and tobacco exposure (She smoked for 28 years before she quit.). Current diabetic treatment includes insulin injections. Her weight is decreasing steadily. She is following a generally unhealthy diet. When asked about meal planning, she reported none. She has not had a previous visit with a dietitian. She rarely participates in exercise. Her home blood glucose trend is decreasing steadily. Her breakfast blood glucose range is generally 140-180 mg/dl. Her overall blood glucose range is 140-180  mg/dl. An ACE inhibitor/angiotensin II receptor blocker is being taken. Eye exam is current.  Hyperlipidemia  This is a chronic problem. The current episode started more than 1 year ago. The problem is controlled. Recent lipid tests were reviewed and are variable. Exacerbating diseases include diabetes, hypothyroidism and obesity. Pertinent negatives include no chest pain,  myalgias or shortness of breath. Current antihyperlipidemic treatment includes statins. Risk factors for coronary artery disease include diabetes mellitus, dyslipidemia, hypertension, family history, obesity and a sedentary lifestyle.  Hypertension  This is a chronic problem. The current episode started more than 1 year ago. The problem is controlled. Pertinent negatives include no chest pain, headaches, palpitations or shortness of breath. Risk factors for coronary artery disease include diabetes mellitus, dyslipidemia, obesity, sedentary lifestyle and smoking/tobacco exposure. Past treatments include angiotensin blockers. Hypertensive end-organ damage includes kidney disease.    Review of Systems  Constitutional: Positive for fatigue. Negative for unexpected weight change.  HENT: Negative for trouble swallowing and voice change.   Eyes: Negative for visual disturbance.  Respiratory: Negative for cough, shortness of breath and wheezing.   Cardiovascular: Negative for chest pain, palpitations and leg swelling.  Gastrointestinal: Negative for diarrhea, nausea and vomiting.  Endocrine: Negative for cold intolerance, heat intolerance, polydipsia, polyphagia and polyuria.  Genitourinary: Positive for frequency. Negative for dysuria and flank pain.  Musculoskeletal: Negative for arthralgias and myalgias.  Skin: Negative for color change, pallor, rash and wound.  Neurological: Negative for seizures and headaches.  Psychiatric/Behavioral: Negative for confusion and suicidal ideas.    Objective:    BP 127/66   Pulse 79   Ht 5\' 3"   (1.6 m)   Wt 199 lb (90.3 kg)   BMI 35.25 kg/m   Wt Readings from Last 3 Encounters:  11/06/17 199 lb (90.3 kg)  07/08/17 203 lb (92.1 kg)  04/08/17 204 lb (92.5 kg)    Physical Exam  Constitutional: She is oriented to person, place, and time. She appears well-developed.  HENT:  Head: Normocephalic and atraumatic.  Eyes: EOM are normal.  Neck: Normal range of motion. Neck supple. No tracheal deviation present. No thyromegaly present.  Cardiovascular: Normal rate.  Pulmonary/Chest: Effort normal.  Abdominal: There is no tenderness. There is no guarding.  Musculoskeletal: Normal range of motion. She exhibits no edema.  Neurological: She is alert and oriented to person, place, and time. She has normal reflexes. No cranial nerve deficit. Coordination normal.  Skin: Skin is warm and dry. No rash noted. No erythema. No pallor.  Psychiatric: She has a normal mood and affect. Judgment normal.    CMP     Component Value Date/Time   NA 134 10/30/2017 1125   K 5.3 (H) 10/30/2017 1125   CL 99 10/30/2017 1125   CO2 21 10/30/2017 1125   GLUCOSE 159 (H) 10/30/2017 1125   GLUCOSE 246 (H) 12/18/2016 1508   BUN 28 (H) 10/30/2017 1125   CREATININE 1.31 (H) 10/30/2017 1125   CALCIUM 9.3 10/30/2017 1125   PROT 6.3 10/30/2017 1125   ALBUMIN 4.7 10/30/2017 1125   AST 16 10/30/2017 1125   ALT 16 10/30/2017 1125   ALKPHOS 77 10/30/2017 1125   BILITOT 0.4 10/30/2017 1125   GFRNONAA 39 (L) 10/30/2017 1125   GFRAA 45 (L) 10/30/2017 1125   Recent Results (from the past 2160 hour(s))  Comprehensive metabolic panel     Status: Abnormal   Collection Time: 10/30/17 11:25 AM  Result Value Ref Range   Glucose 159 (H) 65 - 99 mg/dL   BUN 28 (H) 8 - 27 mg/dL   Creatinine, Ser 1.31 (H) 0.57 - 1.00 mg/dL   GFR calc non Af Amer 39 (L) >59 mL/min/1.73   GFR calc Af Amer 45 (L) >59 mL/min/1.73   BUN/Creatinine Ratio 21 12 - 28   Sodium 134 134 - 144 mmol/L  Potassium 5.3 (H) 3.5 - 5.2 mmol/L    Chloride 99 96 - 106 mmol/L   CO2 21 20 - 29 mmol/L   Calcium 9.3 8.7 - 10.3 mg/dL   Total Protein 6.3 6.0 - 8.5 g/dL   Albumin 4.7 3.5 - 4.8 g/dL   Globulin, Total 1.6 1.5 - 4.5 g/dL   Albumin/Globulin Ratio 2.9 (H) 1.2 - 2.2   Bilirubin Total 0.4 0.0 - 1.2 mg/dL   Alkaline Phosphatase 77 39 - 117 IU/L   AST 16 0 - 40 IU/L   ALT 16 0 - 32 IU/L  Hgb A1c w/o eAG     Status: Abnormal   Collection Time: 10/30/17 11:25 AM  Result Value Ref Range   Hgb A1c MFr Bld 6.5 (H) 4.8 - 5.6 %    Comment:          Prediabetes: 5.7 - 6.4          Diabetes: >6.4          Glycemic control for adults with diabetes: <7.0   Specimen status report     Status: None   Collection Time: 10/30/17 11:25 AM  Result Value Ref Range   specimen status report Comment     Comment: Isac Caddy CMP14 Default Ambig Abbrev CMP14 Default A hand-written panel/profile was received from your office. In accordance with the [facility] Ambiguous Test Code Policy dated July 4650, we have completed your order by using the closest currently or formerly recognized AMA panel.  We have assigned Comprehensive Metabolic Panel (14), Test Code #322000 to this request.  If this is not the testing you wished to receive on this specimen, please contact the [facility] Client Inquiry/Technical Services Department to clarify the test order.  We appreciate your business.    Lipid Panel     Component Value Date/Time   CHOL 146 08/04/2015   TRIG 199 (A) 08/04/2015   HDL 37 08/04/2015   LDLCALC 69 08/04/2015    Assessment & Plan:   1. Type 2 diabetes mellitus with stage 3 chronic kidney disease, without long-term current use of insulin ([place])  - Patient has currently uncontrolled symptomatic type 2 DM since  77 years of age. - She came with near target blood gluco at fasting with stable A1c of 6.5%, overall improving from 8.4%.  - Recent labs reviewed.    Her diabetes is complicated by chronic kidney disease, improving since last  visit, she remains at a high risk for more acute and chronic complications of diabetes which include CAD, CVA, CKD, retinopathy, and neuropathy. These are all discussed in detail with the patient.  - I have counseled the patient on diet management and weight loss, by adopting a carbohydrate restricted/protein rich diet.  -  Suggestion is made for her to avoid simple carbohydrates  from her diet including Cakes, Sweet Desserts / Pastries, Ice Cream, Soda (diet and regular), Sweet Tea, Candies, Chips, Cookies, Store Bought Juices, Alcohol in Excess of  1-2 drinks a day, Artificial Sweeteners, and "Sugar-free" Products. This will help patient to have stable blood glucose profile and potentially avoid unintended weight gain.   - I encouraged the patient to switch to  unprocessed or minimally processed complex starch and increased protein intake (animal or plant source), fruits, and vegetables.  - Patient is advised to stick to a routine mealtimes to eat 3 meals  a day and avoid unnecessary snacks ( to snack only to correct hypoglycemia).   - I have approached patient with  the following individualized plan to manage diabetes and patient agrees:   - She will  continue to need at least basal  insulin treatment. -I advised her to continue Lantus 40 units nightly associated with , associated with monitoring of blood glucose twice a day before breakfast and at bedtime.  -Due to unfavorable renal function, I advised her to stay away from metformin.  Renal function is improving, may be reconsidered for low-dose metformin on subsequent visits.  - She is not a candidate for  SGLT2 inhibitors  at this time. -She may benefit from incretin therapy, however she is hesitant to add more medications at this time. - Patient specific target  A1c;  LDL, HDL, Triglycerides, and  Waist Circumference were discussed in detail.  2) BP/HTN: Blood pressure is controlled to target.  She is advised to continue current  medications including losartan 100 mg p.o. daily.    3) Lipids/HPL: Her recent lipid panel showed controlled LDL of 69.   I have advised her to continue her simvastatin 10 mg p.o. nightly.  Side effects and precautions discussed with her.  4)  Weight/Diet: CDE Consult is in progress  , exercise, and detailed carbohydrates information provided.  5) Chronic Care/Health Maintenance:  -Patient is on ACEI/ARB and Statin medications and encouraged to continue to follow up with Ophthalmology, Podiatrist at least yearly or according to recommendations, and advised to   stay away from smoking. I have recommended yearly flu vaccine and pneumonia vaccination at least every 5 years; moderate intensity exercise for up to 150 minutes weekly; and  sleep for at least 7 hours a day.  - I advised patient to maintain close follow up with Monico Blitz, MD for primary care needs.   - Time spent with the patient: 25 min, of which >50% was spent in reviewing her blood glucose logs , discussing her hypo- and hyper-glycemic episodes, reviewing her current and  previous labs and insulin doses and developing a plan to avoid hypo- and hyper-glycemia. Please refer to Patient Instructions for Blood Glucose Monitoring and Insulin/Medications Dosing Guide"  in media tab for additional information. Rexene Edison participated in the discussions, expressed understanding, and voiced agreement with the above plans.  All questions were answered to her satisfaction. she is encouraged to contact clinic should she have any questions or concerns prior to her return visit.   Follow up plan: - Return in about 6 months (around 05/09/2018) for Follow up with Pre-visit Labs, Meter, and Logs.  Glade Lloyd, MD Phone: [phone]  Fax: [phone]   -  This note was partially dictated with voice recognition software. Similar sounding words can be transcribed inadequately or may not  be corrected upon review.  11/06/2017, 5:07 PM

## 2017-11-06 NOTE — Patient Instructions (Signed)

## 2018-02-15 ENCOUNTER — Other Ambulatory Visit: Payer: Self-pay | Admitting: "Endocrinology

## 2018-03-10 ENCOUNTER — Telehealth: Payer: Self-pay | Admitting: "Endocrinology

## 2018-03-10 NOTE — Telephone Encounter (Signed)
Pt.notified

## 2018-03-10 NOTE — Telephone Encounter (Signed)
Advise patient to increase her Lantus to 60 units nightly, start monitoring blood glucose 4 times a day and return for a visit in 10 days with her meter and logs.

## 2018-03-10 NOTE — Telephone Encounter (Signed)
Pt states they have had high BG readings.   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 The only new medications she has taken were antibiotics which she has finished.

## 2018-03-31 ENCOUNTER — Telehealth: Payer: Self-pay

## 2018-03-31 NOTE — Telephone Encounter (Signed)
Opened in error

## 2018-04-09 ENCOUNTER — Other Ambulatory Visit: Payer: Self-pay | Admitting: "Endocrinology

## 2018-05-04 ENCOUNTER — Other Ambulatory Visit: Payer: Self-pay | Admitting: "Endocrinology

## 2018-05-05 LAB — COMPREHENSIVE METABOLIC PANEL
ALT: 20 IU/L (ref 0–32)
AST: 18 IU/L (ref 0–40)
Albumin/Globulin Ratio: 1.9 (ref 1.2–2.2)
Albumin: 4.2 g/dL (ref 3.7–4.7)
Alkaline Phosphatase: 91 IU/L (ref 39–117)
BUN/Creatinine Ratio: 17 (ref 12–28)
BUN: 32 mg/dL — ABNORMAL HIGH (ref 8–27)
Bilirubin Total: 0.5 mg/dL (ref 0.0–1.2)
CHLORIDE: 100 mmol/L (ref 96–106)
CO2: 21 mmol/L (ref 20–29)
Calcium: 9.4 mg/dL (ref 8.7–10.3)
Creatinine, Ser: 1.83 mg/dL — ABNORMAL HIGH (ref 0.57–1.00)
GFR calc Af Amer: 30 mL/min/{1.73_m2} — ABNORMAL LOW (ref >59)
GFR calc non Af Amer: 26 mL/min/{1.73_m2} — ABNORMAL LOW (ref >59)
Globulin, Total: 2.2 g/dL (ref 1.5–4.5)
Glucose: 100 mg/dL — ABNORMAL HIGH (ref 65–99)
Potassium: 5.4 mmol/L — ABNORMAL HIGH (ref 3.5–5.2)
Sodium: 138 mmol/L (ref 134–144)
Total Protein: 6.4 g/dL (ref 6.0–8.5)

## 2018-05-05 LAB — VITAMIN D 25 HYDROXY (VIT D DEFICIENCY, FRACTURES): Vit D, 25-Hydroxy: 50.3 ng/mL (ref 30.0–100.0)

## 2018-05-05 LAB — LIPID PANEL W/O CHOL/HDL RATIO
CHOLESTEROL TOTAL: 135 mg/dL (ref 100–199)
HDL: 38 mg/dL — ABNORMAL LOW (ref >39)
LDL Calculated: 78 mg/dL (ref 0–99)
Triglycerides: 94 mg/dL (ref 0–149)
VLDL Cholesterol Cal: 19 mg/dL (ref 5–40)

## 2018-05-05 LAB — TSH: TSH: 5.05 u[IU]/mL — AB (ref 0.450–4.500)

## 2018-05-05 LAB — MICROALBUMIN, URINE: Microalbumin, Urine: 26 ug/mL

## 2018-05-05 LAB — T4, FREE: Free T4: 1.5 ng/dL (ref 0.82–1.77)

## 2018-05-05 LAB — HGB A1C W/O EAG: Hgb A1c MFr Bld: 6.4 % — ABNORMAL HIGH (ref 4.8–5.6)

## 2018-05-05 LAB — SPECIMEN STATUS REPORT

## 2018-05-11 ENCOUNTER — Ambulatory Visit (INDEPENDENT_AMBULATORY_CARE_PROVIDER_SITE_OTHER): Payer: Medicare Other | Admitting: "Endocrinology

## 2018-05-11 ENCOUNTER — Encounter: Payer: Self-pay | Admitting: "Endocrinology

## 2018-05-11 VITALS — BP 137/70 | HR 82 | Resp 12 | Ht 63.0 in | Wt 195.2 lb

## 2018-05-11 DIAGNOSIS — N183 Chronic kidney disease, stage 3 (moderate): Secondary | ICD-10-CM

## 2018-05-11 DIAGNOSIS — I10 Essential (primary) hypertension: Secondary | ICD-10-CM | POA: Diagnosis not present

## 2018-05-11 DIAGNOSIS — E782 Mixed hyperlipidemia: Secondary | ICD-10-CM | POA: Diagnosis not present

## 2018-05-11 DIAGNOSIS — E038 Other specified hypothyroidism: Secondary | ICD-10-CM

## 2018-05-11 DIAGNOSIS — E1122 Type 2 diabetes mellitus with diabetic chronic kidney disease: Secondary | ICD-10-CM

## 2018-05-11 MED ORDER — LEVOTHYROXINE SODIUM 75 MCG PO TABS: 75.0000 ug | ORAL_TABLET | Freq: Every day | ORAL | 5 refills | Status: DC

## 2018-05-11 MED ORDER — INSULIN GLARGINE 100 UNIT/ML SOLOSTAR PEN: PEN_INJECTOR | SUBCUTANEOUS | 2 refills | Status: DC

## 2018-05-11 NOTE — Patient Instructions (Signed)

## 2018-05-11 NOTE — Progress Notes (Signed)
Endocrinology follow-up note  Subjective:    Patient ID: Debra Hopkins, female    DOB: 13-Aug-1940. Patient is being seen in follow up for management of chronic and uncontrolled type 2 diabetes, hyperlipidemia, hypertension.   PMD:   Monico Blitz, MD  Past Medical History:  Diagnosis Date  . Arthritis   . CAD (coronary artery disease)   . Carotid artery occlusion   . Cataracts, bilateral   . DDD (degenerative disc disease)   . Diabetes mellitus    Type 2  . Diverticulitis   . Dyspnea   . Fibromyalgia   . GERD (gastroesophageal reflux disease)   . Heart murmur   . Hemorrhoids   . History of shingles   . Impaired hearing   . Macular degeneration   . Other and unspecified hyperlipidemia   . PVD (peripheral vascular disease) ([place])   . TIA (transient ischemic attack)   . Unspecified essential hypertension   . Varicose veins    Past Surgical History:  Procedure Laterality Date  . ANGIOPLASTY     left leg 2003  . APPENDECTOMY    . aspiration of a cyst in the right breast    . BALLOON DILATION N/A 10/01/2013   Procedure: BALLOON DILATION;  Surgeon: Rogene Houston, MD;  [facility];  Service: Endoscopy;  Laterality: N/A;  . BREAST BIOPSY    . BREAST BIOPSY Left   . CARDIAC CATHETERIZATION    . CAROTID ENDARTERECTOMY  12-03-06   left CEA  . CATARACT EXTRACTION W/PHACO Right 10/14/2013   Procedure: CATARACT EXTRACTION PHACO AND INTRAOCULAR LENS PLACEMENT (IOC);  Surgeon: Tonny Branch, MD;  Location: [facility];  Service: Ophthalmology;  Laterality: Right;  CDE 7.20  . CATARACT EXTRACTION W/PHACO Left 11/04/2013   Procedure: CATARACT EXTRACTION PHACO AND INTRAOCULAR LENS PLACEMENT (IOC);  Surgeon: Tonny Branch, MD;  Location: [facility];  Service: Ophthalmology;  Laterality: Left;  CDE 8.57  . COLONOSCOPY N/A 09/19/2014   Procedure: COLONOSCOPY;  Surgeon: Rogene Houston, MD;  [facility];  Service: Endoscopy;  Laterality: N/A;  7:30-8:30  . CYSTO WITH HYDRODISTENSION N/A  03/29/2016   Procedure: CYSTOSCOPY/HYDRODISTENSION;  Surgeon: Irine Seal, MD;  Location: [facility];  Service: Urology;  Laterality: N/A;  . CYSTOSCOPY WITH INJECTION N/A 03/29/2016   Procedure: CYSTOSCOPY WITH PYRIDIUM AND MARCAINE INSTALLATION;  Surgeon: Irine Seal, MD;  Location: [facility];  Service: Urology;  Laterality: N/A;  . ESOPHAGOGASTRODUODENOSCOPY N/A 10/01/2013   Procedure: ESOPHAGOGASTRODUODENOSCOPY (EGD);  Surgeon: Rogene Houston, MD;  [facility];  Service: Endoscopy;  Laterality: N/A;  120  . ESOPHAGOGASTRODUODENOSCOPY N/A 10/24/2014   Procedure: ESOPHAGOGASTRODUODENOSCOPY (EGD);  Surgeon: Rogene Houston, MD;  [facility];  Service: Endoscopy;  Laterality: N/A;  730  . GIVENS CAPSULE STUDY N/A 10/24/2014   Procedure: GIVENS CAPSULE STUDY;  Surgeon: Rogene Houston, MD;  [facility];  Service: Endoscopy;  Laterality: N/A;  . JOINT REPLACEMENT  2011   Right total knee replacement  . [place]  . KNEE ARTHROSCOPY  2009  . MALONEY DILATION N/A 10/01/2013   Procedure: Venia Minks DILATION;  Surgeon: Rogene Houston, MD;  [facility];  Service: Endoscopy;  Laterality: N/A;  . SAVORY DILATION N/A 10/01/2013   Procedure: SAVORY DILATION;  Surgeon: Rogene Houston, MD;  [facility];  Service: Endoscopy;  Laterality: N/A;  . SPINE SURGERY  1997   C5-c6 fusion  . TOTAL ABDOMINAL  HYSTERECTOMY  1973   Social History   Socioeconomic History  . Marital status: Widowed    Spouse name: Not on file  . Number of children: Not on file  . Years of education: Not on file  . Highest education level: Not on file  Occupational History  . Not on file  Social Needs  . Financial resource strain: Not on file  . Food insecurity:    Worry: Not on file    Inability: Not on file  . Transportation needs:    Medical: Not on file    Non-medical: Not on file  Tobacco Use  . Smoking status: Former Smoker    Types: Cigarettes    Last  attempt to quit: 03/11/1985    Years since quitting: 33.1  . Smokeless tobacco: Never Used  Substance and Sexual Activity  . Alcohol use: No    Alcohol/week: 0.0 standard drinks  . Drug use: No  . Sexual activity: Not on file  Lifestyle  . Physical activity:    Days per week: Not on file    Minutes per session: Not on file  . Stress: Not on file  Relationships  . Social connections:    Talks on phone: Not on file    Gets together: Not on file    Attends religious service: Not on file    Active member of club or organization: Not on file    Attends meetings of clubs or organizations: Not on file    Relationship status: Not on file  Other Topics Concern  . Not on file  Social History Narrative   Retired. Married.    Outpatient Encounter Medications as of 05/11/2018  Medication Sig  . Alum Hydroxide-Mag Trisilicate (GAVISCON) 56-21.3 MG CHEW Chew 2-3 tablets by mouth daily as needed (acid reflux).   . Carboxymeth-Glycerin-Polysorb (REFRESH OPTIVE ADVANCED) 0.5-1-0.5 % SOLN Apply 1 drop to eye daily as needed (for dry eyes).   . cloNIDine (CATAPRES) 0.1 MG tablet Take 0.1-0.2 mg by mouth See admin instructions. TAKES 0.1 MG IN THE MORNING AND 0.2 MG AT BEDTIME  . Darbepoetin Alfa 60 MCG/ML SOLN Inject into the skin every 30 (thirty) days.  Marland Kitchen esomeprazole (NEXIUM) 20 MG capsule Take 20 mg by mouth daily before breakfast.   . fexofenadine (ALLEGRA) 30 MG/5ML suspension Take 30 mg by mouth daily as needed (for allergies). TAKES CHILDREN'S LIQUID   . gabapentin (NEURONTIN) 100 MG capsule Take 1 capsule by mouth 3 (three) times daily as needed.  Marland Kitchen GLOBAL EASE INJECT PEN NEEDLES 32G X 4 MM MISC USE ONCE DAILY WITH SOLOSTAR INSULIN  . hydrALAZINE (APRESOLINE) 25 MG tablet Take 25 mg by mouth daily.   . Insulin Glargine (LANTUS SOLOSTAR) 100 UNIT/ML Solostar Pen INJECT 50 UNITS INTO THE SKIN EVERY DAY AT 10 IN THE EVENING  . ketotifen (ALAWAY) 0.025 % ophthalmic solution Place 1 drop into both  eyes daily as needed (for allergies).   Marland Kitchen levothyroxine (SYNTHROID, LEVOTHROID) 75 MCG tablet Take 1 tablet (75 mcg total) by mouth daily before breakfast.  . losartan (COZAAR) 100 MG tablet Take 100 mg by mouth daily.    . Methylcellulose, Laxative, (CITRUCEL PO) Take 1 each by mouth every other day.  . Multiple Vitamins-Minerals (PRESERVISION AREDS 2 PO) Take 1-2 tablets by mouth See admin instructions. Take 1 capsule by mouth every other day alternating with 2 capsules by mouth every other day  . NIFEdipine (PROCARDIA-XL/ADALAT-CC/NIFEDICAL-XL) 30 MG 24 hr tablet Take 30 mg by mouth daily.  Marland Kitchen  Propylene Glycol (SYSTANE BALANCE OP) Apply 1 drop to eye daily as needed (for dry eyes).   . simvastatin (ZOCOR) 10 MG tablet Take 10 mg by mouth every other day.  . torsemide (DEMADEX) 20 MG tablet Take 20 mg by mouth daily.   . traZODone (DESYREL) 100 MG tablet Take 100 mg by mouth at bedtime.  . [DISCONTINUED] Insulin Glargine (LANTUS SOLOSTAR) 100 UNIT/ML Solostar Pen INJECT 40 UNITS SUB-Q EVERY DAY AT 10:00 PM.  . [DISCONTINUED] Insulin Glargine (LANTUS SOLOSTAR) 100 UNIT/ML Solostar Pen INJECT 40 UNITS INTO THE SKIN EVERY DAY AT 10 IN THE EVENING  . [DISCONTINUED] levothyroxine (SYNTHROID, LEVOTHROID) 50 MCG tablet Take 50 mcg by mouth daily before breakfast.   Facility-Administered Encounter Medications as of 05/11/2018  Medication  . vancomycin (VANCOCIN) 1,000 mg in sodium chloride 0.9 % 500 mL IVPB   ALLERGIES: Allergies  Allergen Reactions  . Amoxicillin-Pot Clavulanate Hives and Other (See Comments)    Has patient had a PCN reaction causing immediate rash, facial/tongue/throat swelling, SOB or lightheadedness with hypotension: UNKNOWN Has patient had a PCN reaction causing severe rash involving mucus membranes or skin necrosis: UNKNOWN Has patient had a PCN reaction that required hospitalization No Has patient had a PCN reaction occurring within the last 10 years: No If all of the above  answers are "NO", then may proceed with Cephalosporin use.   . Byetta 10 Mcg Pen [Exenatide] Other (See Comments)    Unknown  . Ceclor [Cefaclor] Other (See Comments)    Unknown  . Celexa [Citalopram Hydrobromide] Other (See Comments)    Sick  . Ciprofloxacin Nausea Only and Other (See Comments)    Dizziness  . Claritin [Loratadine] Other (See Comments)    Hair fell out  . Erythromycin Other (See Comments)    REACTION: Upset stomach  . Flexeril [Cyclobenzaprine] Other (See Comments)    Nervous  . Fosamax [Alendronate] Other (See Comments)    Made pt sick  . Jardiance [Empagliflozin] Other (See Comments)    Vaginal infection, UTI  . Motrin [Ibuprofen] Nausea Only  . Naproxen Other (See Comments)    REACTION: severe nausea  . Norvasc [Amlodipine Besylate] Other (See Comments)    Unknown  . Prednisone Other (See Comments)    REACTION: itchy rash  . Prozac [Fluoxetine Hcl] Other (See Comments)    Sick  . Sulfamethoxazole-Trimethoprim Nausea And Vomiting and Other (See Comments)    REACTION: Upset stomach   VACCINATION STATUS:  There is no immunization history on file for this patient.  Diabetes  She presents for her follow-up diabetic visit. She has type 2 diabetes mellitus. Onset time: She was diagnosed at approximate age of 27 years. Her disease course has been improving. There are no hypoglycemic associated symptoms. Pertinent negatives for hypoglycemia include no confusion, headaches, pallor or seizures. Associated symptoms include fatigue. Pertinent negatives for diabetes include no chest pain, no polydipsia, no polyphagia and no polyuria. There are no hypoglycemic complications. Symptoms are improving. Diabetic complications include nephropathy. Risk factors for coronary artery disease include dyslipidemia, diabetes mellitus, hypertension, obesity, sedentary lifestyle and tobacco exposure (She smoked for 28 years before she quit.). Current diabetic treatment includes insulin  injections. Her weight is decreasing steadily. She is following a generally unhealthy diet. When asked about meal planning, she reported none. She has not had a previous visit with a dietitian. She rarely participates in exercise. Her home blood glucose trend is decreasing steadily. Her breakfast blood glucose range is generally 110-130 mg/dl. Her bedtime  blood glucose range is generally 130-140 mg/dl. Her overall blood glucose range is 130-140 mg/dl. An ACE inhibitor/angiotensin II receptor blocker is being taken. Eye exam is current.  Hyperlipidemia  This is a chronic problem. The current episode started more than 1 year ago. The problem is controlled. Recent lipid tests were reviewed and are variable. Exacerbating diseases include diabetes, hypothyroidism and obesity. Pertinent negatives include no chest pain, myalgias or shortness of breath. Current antihyperlipidemic treatment includes statins. Risk factors for coronary artery disease include diabetes mellitus, dyslipidemia, hypertension, family history, obesity and a sedentary lifestyle.  Hypertension  This is a chronic problem. The current episode started more than 1 year ago. The problem is controlled. Pertinent negatives include no chest pain, headaches, palpitations or shortness of breath. Risk factors for coronary artery disease include diabetes mellitus, dyslipidemia, obesity, sedentary lifestyle and smoking/tobacco exposure. Past treatments include angiotensin blockers. Hypertensive end-organ damage includes kidney disease.    Review of Systems  Constitutional: Positive for fatigue. Negative for unexpected weight change.  HENT: Negative for trouble swallowing and voice change.   Eyes: Negative for visual disturbance.  Respiratory: Negative for cough, shortness of breath and wheezing.   Cardiovascular: Negative for chest pain, palpitations and leg swelling.  Gastrointestinal: Negative for diarrhea, nausea and vomiting.  Endocrine: Negative  for cold intolerance, heat intolerance, polydipsia, polyphagia and polyuria.  Genitourinary: Positive for frequency. Negative for dysuria and flank pain.  Musculoskeletal: Negative for arthralgias and myalgias.  Skin: Negative for color change, pallor, rash and wound.  Neurological: Negative for seizures and headaches.  Psychiatric/Behavioral: Negative for confusion and suicidal ideas.    Objective:    BP 137/70   Pulse 82   Resp 12   Ht 5\' 3"  (1.6 m)   Wt 195 lb 3.2 oz (88.5 kg)   SpO2 98% Comment: room air  BMI 34.58 kg/m   Wt Readings from Last 3 Encounters:  05/11/18 195 lb 3.2 oz (88.5 kg)  11/06/17 199 lb (90.3 kg)  07/08/17 203 lb (92.1 kg)    Physical Exam  Constitutional: She is oriented to person, place, and time. She appears well-developed.  HENT:  Head: Normocephalic and atraumatic.  Eyes: EOM are normal.  Neck: Normal range of motion. Neck supple. No tracheal deviation present. No thyromegaly present.  Cardiovascular: Normal rate.  Pulmonary/Chest: Effort normal.  Abdominal: There is no abdominal tenderness. There is no guarding.  Musculoskeletal: Normal range of motion.        General: No edema.  Neurological: She is alert and oriented to person, place, and time. She has normal reflexes. No cranial nerve deficit. Coordination normal.  Skin: Skin is warm and dry. No rash noted. No erythema. No pallor.  Psychiatric: She has a normal mood and affect. Judgment normal.    CMP     Component Value Date/Time   NA 138 05/04/2018 1136   K 5.4 (H) 05/04/2018 1136   CL 100 05/04/2018 1136   CO2 21 05/04/2018 1136   GLUCOSE 100 (H) 05/04/2018 1136   GLUCOSE 246 (H) 12/18/2016 1508   BUN 32 (H) 05/04/2018 1136   CREATININE 1.83 (H) 05/04/2018 1136   CALCIUM 9.4 05/04/2018 1136   PROT 6.4 05/04/2018 1136   ALBUMIN 4.2 05/04/2018 1136   AST 18 05/04/2018 1136   ALT 20 05/04/2018 1136   ALKPHOS 91 05/04/2018 1136   BILITOT 0.5 05/04/2018 1136   GFRNONAA 26 (L)  05/04/2018 1136   GFRAA 30 (L) 05/04/2018 1136   Recent Results (from  the past 2160 hour(s))  Comprehensive metabolic panel     Status: Abnormal   Collection Time: 05/04/18 11:36 AM  Result Value Ref Range   Glucose 100 (H) 65 - 99 mg/dL   BUN 32 (H) 8 - 27 mg/dL   Creatinine, Ser 1.83 (H) 0.57 - 1.00 mg/dL   GFR calc non Af Amer 26 (L) >59 mL/min/1.73   GFR calc Af Amer 30 (L) >59 mL/min/1.73   BUN/Creatinine Ratio 17 12 - 28   Sodium 138 134 - 144 mmol/L   Potassium 5.4 (H) 3.5 - 5.2 mmol/L   Chloride 100 96 - 106 mmol/L   CO2 21 20 - 29 mmol/L   Calcium 9.4 8.7 - 10.3 mg/dL   Total Protein 6.4 6.0 - 8.5 g/dL   Albumin 4.2 3.7 - 4.7 g/dL    Comment:               **Please note reference interval change**   Globulin, Total 2.2 1.5 - 4.5 g/dL   Albumin/Globulin Ratio 1.9 1.2 - 2.2   Bilirubin Total 0.5 0.0 - 1.2 mg/dL   Alkaline Phosphatase 91 39 - 117 IU/L   AST 18 0 - 40 IU/L   ALT 20 0 - 32 IU/L  Lipid Panel w/o Chol/HDL Ratio     Status: Abnormal   Collection Time: 05/04/18 11:36 AM  Result Value Ref Range   Cholesterol, Total 135 100 - 199 mg/dL   Triglycerides 94 0 - 149 mg/dL   HDL 38 (L) >39 mg/dL   VLDL Cholesterol Cal 19 5 - 40 mg/dL   LDL Calculated 78 0 - 99 mg/dL  Hgb A1c w/o eAG     Status: Abnormal   Collection Time: 05/04/18 11:36 AM  Result Value Ref Range   Hgb A1c MFr Bld 6.4 (H) 4.8 - 5.6 %    Comment:          Prediabetes: 5.7 - 6.4          Diabetes: >6.4          Glycemic control for adults with diabetes: <7.0   T4, free     Status: None   Collection Time: 05/04/18 11:36 AM  Result Value Ref Range   Free T4 1.50 0.82 - 1.77 ng/dL  TSH     Status: Abnormal   Collection Time: 05/04/18 11:36 AM  Result Value Ref Range   TSH 5.050 (H) 0.450 - 4.500 uIU/mL  VITAMIN D 25 Hydroxy (Vit-D Deficiency, Fractures)     Status: None   Collection Time: 05/04/18 11:36 AM  Result Value Ref Range   Vit D, 25-Hydroxy 50.3 30.0 - 100.0 ng/mL    Comment:  Vitamin D deficiency has been defined by the Institute of Medicine and an Endocrine Society practice guideline as a level of serum 25-OH vitamin D less than 20 ng/mL (1,2). The Endocrine Society went on to further define vitamin D insufficiency as a level between 21 and 29 ng/mL (2). 1. IOM (Institute of Medicine). 2010. Dietary reference    intakes for calcium and D. [place]: The    [employer]. 2. Holick MF, Binkley Turon, Bischoff-Ferrari HA, et al.    Evaluation, treatment, and prevention of vitamin D    deficiency: an Endocrine Society clinical practice    guideline. JCEM. 2011 Jul; 96(7):1911-30.   Microalbumin, urine     Status: None   Collection Time: 05/04/18 11:36 AM  Result Value Ref Range   Microalbumin, Urine 26.0  Not Estab. ug/mL  Specimen status report     Status: None   Collection Time: 05/04/18 11:36 AM  Result Value Ref Range   specimen status report Comment     Comment: Isac Caddy CMP14 Default Isac Caddy CMP14 Default A hand-written panel/profile was received from your office. In accordance with the [facility] Ambiguous Test Code Policy dated July 7616, we have completed your order by using the closest currently or formerly recognized AMA panel.  We have assigned Comprehensive Metabolic Panel (14), Test Code #322000 to this request.  If this is not the testing you wished to receive on this specimen, please contact the [place] Client Inquiry/Technical Services Department to clarify the test order.  We appreciate your business. Ambig Abbrev LP Default Ambig Abbrev LP Default A hand-written panel/profile was received from your office. In accordance with the [facility] Ambiguous Test Code Policy dated July 0737, we have completed your order by using the closest currently or formerly recognized AMA panel.  We have assigned Lipid Panel, Test Code [phone] to this request. If this is not the testing you wished to receive on this specimen, plea se  contact the [place] Client Inquiry/Technical Services Department to clarify the test order.  We appreciate your business.    Lipid Panel     Component Value Date/Time   CHOL 135 05/04/2018 1136   TRIG 94 05/04/2018 1136   HDL 38 (L) 05/04/2018 1136   LDLCALC 78 05/04/2018 1136    Assessment & Plan:   1. Type 2 diabetes mellitus with stage 3 chronic kidney disease, without long-term current use of insulin ([place])  - Patient has currently uncontrolled symptomatic type 2 DM since  78 years of age. - She came with tightly controlled fasting glucose profile and A1c of 6.4%, slowly improving from 8.4%.     - Recent labs reviewed.    Her diabetes is complicated by chronic kidney disease, improving since last visit, she remains at a high risk for more acute and chronic complications of diabetes which include CAD, CVA, CKD, retinopathy, and neuropathy. These are all discussed in detail with the patient.  - I have counseled the patient on diet management and weight loss, by adopting a carbohydrate restricted/protein rich diet.  - Patient admits there is a room for improvement in her diet and drink choices. -  Suggestion is made for her to avoid simple carbohydrates  from her diet including Cakes, Sweet Desserts / Pastries, Ice Cream, Soda (diet and regular), Sweet Tea, Candies, Chips, Cookies, Store Bought Juices, Alcohol in Excess of  1-2 drinks a day, Artificial Sweeteners, and "Sugar-free" Products. This will help patient to have stable blood glucose profile and potentially avoid unintended weight gain.   - I encouraged the patient to switch to  unprocessed or minimally processed complex starch and increased protein intake (animal or plant source), fruits, and vegetables.  - Patient is advised to stick to a routine mealtimes to eat 3 meals  a day and avoid unnecessary snacks ( to snack only to correct hypoglycemia).   - I have approached patient with the following individualized plan to  manage diabetes and patient agrees:   -She will continue to need at least basal insulin in order for her to maintain control of diabetes to target.   -She is advised to lower her Lantus to 50 units nightly associated with strict monitoring of blood glucose 2 times a day-daily before breakfast and at bedtime.    -Due to unfavorable renal function, I  advised her to stay away from metformin.  If renal function shows improvement, she will be reconsidered for low-dose metformin on subsequent visits.  - She is not a candidate for  SGLT2 inhibitors  at this time. -She may benefit from incretin therapy, however she is hesitant to add more medications at this time. - Patient specific target  A1c;  LDL, HDL, Triglycerides, and  Waist Circumference were discussed in detail.  2) BP/HTN: Blood pressure is controlled to target.  She is advised to continue current medications including losartan 100 mg p.o. daily.      3) Lipids/HPL: Her recent lipid panel showed controlled LDL of 69.   I have advised her to continue her simvastatin 10 mg p.o. nightly.  Side effects and precautions discussed with her.  4)  Weight/Diet: CDE Consult is in progress  , exercise, and detailed carbohydrates information provided.  5) Chronic Care/Health Maintenance:  -Patient is on ACEI/ARB and Statin medications and encouraged to continue to follow up with Ophthalmology, Podiatrist at least yearly or according to recommendations, and advised to   stay away from smoking. I have recommended yearly flu vaccine and pneumonia vaccination at least every 5 years; moderate intensity exercise for up to 150 minutes weekly; and  sleep for at least 7 hours a day.  - I advised patient to maintain close follow up with Monico Blitz, MD for primary care needs.   - Time spent with the patient: 25 min, of which >50% was spent in reviewing her blood glucose logs , discussing her hypoglycemia and hyperglycemia episodes, reviewing her current and   previous labs / studies and medications  doses and developing a plan to avoid hypoglycemia and hyperglycemia. Please refer to Patient Instructions for Blood Glucose Monitoring and Insulin/Medications Dosing Guide"  in media tab for additional information. Rexene Edison participated in the discussions, expressed understanding, and voiced agreement with the above plans.  All questions were answered to her satisfaction. she is encouraged to contact clinic should she have any questions or concerns prior to her return visit.   Follow up plan: - Return in about 6 months (around 11/11/2018) for Follow up with Pre-visit Labs, Meter, and Logs.  Glade Lloyd, MD Phone: [phone]  Fax: [phone]   -  This note was partially dictated with voice recognition software. Similar sounding words can be transcribed inadequately or may not  be corrected upon review.  05/11/2018, 4:01 PM

## 2018-05-14 ENCOUNTER — Other Ambulatory Visit: Payer: Self-pay

## 2018-05-14 DIAGNOSIS — Z87891 Personal history of nicotine dependence: Secondary | ICD-10-CM

## 2018-05-14 DIAGNOSIS — I779 Disorder of arteries and arterioles, unspecified: Secondary | ICD-10-CM

## 2018-05-15 ENCOUNTER — Encounter (HOSPITAL_COMMUNITY): Payer: Medicare Other

## 2018-05-15 ENCOUNTER — Ambulatory Visit: Payer: Medicare Other | Admitting: Family

## 2018-05-18 ENCOUNTER — Ambulatory Visit: Payer: Medicare Other | Admitting: Family

## 2018-05-18 ENCOUNTER — Encounter (HOSPITAL_COMMUNITY): Payer: Medicare Other

## 2018-05-19 ENCOUNTER — Ambulatory Visit: Payer: Medicare Other | Admitting: Family

## 2018-05-19 ENCOUNTER — Encounter: Payer: Self-pay | Admitting: Family

## 2018-05-19 ENCOUNTER — Other Ambulatory Visit: Payer: Self-pay

## 2018-05-19 ENCOUNTER — Ambulatory Visit (HOSPITAL_COMMUNITY)
Admission: RE | Admit: 2018-05-19 | Discharge: 2018-05-19 | Disposition: A | Discharge status: Home / Self Care | Payer: Medicare Other | Source: Ambulatory Visit | Attending: Vascular Surgery | Admitting: Vascular Surgery

## 2018-05-19 VITALS — BP 115/48 | HR 63 | Temp 97.2°F | Resp 18 | Ht 63.0 in | Wt 201.7 lb

## 2018-05-19 DIAGNOSIS — Z9889 Other specified postprocedural states: Secondary | ICD-10-CM | POA: Diagnosis not present

## 2018-05-19 DIAGNOSIS — I6523 Occlusion and stenosis of bilateral carotid arteries: Secondary | ICD-10-CM | POA: Diagnosis not present

## 2018-05-19 DIAGNOSIS — Z87891 Personal history of nicotine dependence: Secondary | ICD-10-CM | POA: Diagnosis not present

## 2018-05-19 DIAGNOSIS — I779 Disorder of arteries and arterioles, unspecified: Secondary | ICD-10-CM

## 2018-05-19 DIAGNOSIS — Z959 Presence of cardiac and vascular implant and graft, unspecified: Secondary | ICD-10-CM

## 2018-05-19 NOTE — Patient Instructions (Addendum)
Peripheral Vascular Disease  Peripheral vascular disease (PVD) is a disease of the blood vessels that are not part of your heart and brain. A simple term for PVD is poor circulation. In most cases, PVD narrows the blood vessels that carry blood from your heart to the rest of your body. This can reduce the supply of blood to your arms, legs, and internal organs, like your stomach or kidneys. However, PVD most often affects a person's lower legs and feet. Without treatment, PVD tends to get worse. PVD can also lead to acute ischemic limb. This is when an arm or leg suddenly cannot get enough blood. This is a medical emergency. Follow these instructions at home: Lifestyle  Do not use any products that contain nicotine or tobacco, such as cigarettes and e-cigarettes. If you need help quitting, ask your doctor.  Lose weight if you are overweight. Or, stay at a healthy weight as told by your doctor.  Eat a diet that is low in fat and cholesterol. If you need help, ask your doctor.  Exercise regularly. Ask your doctor for activities that are right for you. General instructions  Take over-the-counter and prescription medicines only as told by your doctor.  Take good care of your feet: ? Wear comfortable shoes that fit well. ? Check your feet often for any cuts or sores.  Keep all follow-up visits as told by your doctor This is important. Contact a doctor if:  You have cramps in your legs when you walk.  You have leg pain when you are at rest.  You have coldness in a leg or foot.  Your skin changes.  You are unable to get or have an erection (erectile dysfunction).  You have cuts or sores on your feet that do not heal. Get help right away if:  Your arm or leg turns cold, numb, and blue.  Your arms or legs become red, warm, swollen, painful, or numb.  You have chest pain.  You have trouble breathing.  You suddenly have weakness in your face, arm, or leg.  You become very  confused or you cannot speak.  You suddenly have a very bad headache.  You suddenly cannot see. Summary  Peripheral vascular disease (PVD) is a disease of the blood vessels.  A simple term for PVD is poor circulation. Without treatment, PVD tends to get worse.  Treatment may include exercise, low fat and low cholesterol diet, and quitting smoking. This information is not intended to replace advice given to you by your health care provider. Make sure you discuss any questions you have with your health care provider. Document Released: 05/22/2009 Document Revised: 04/04/2016 Document Reviewed: 04/04/2016 Elsevier Interactive Patient Education  2019 [employer] Inc[employer]  

## 2018-05-19 NOTE — Progress Notes (Signed)
[facility] [place]   CC: Follow up peripheral artery occlusive disease  History of Present Illness Debra Hopkins is a 78 y.o. female who is status post left CEA in September 2008 by Dr. Kellie Simmering, left SFA PTA with stent 2003. She returns today for surveillance.  Was having cramping in left calf after walking about 1/2 block, but she is not walking enough to elicit claudication sx's since about the Summer of 2018 when her fatigue increased; denies non-healing wounds, denies rest pain. Pt admits to not walking as much due to painful arthritis in herknees, andfeet at times, lack of padding in the soles of her feet  States her last TIA was about 1995 as manifested by feeling dizzy and nauseated for about 15 minutes; she was then told by a medical provider that she had TIA's. She did nothave hemiplegia, monocular blindness, facial drooping, or aphasia. Her DM is in good control, she stopped smoking in the 1990's, she takes a daily ASA and a statin, she is obese. She rarely drinks ETOH.  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  She was diagnosed with bone marrow cancer early in 2020. Had chemo recently, could not get the next round of chemo due to low WBC. Severe fatigue limits her walking.  Right foot is painful from neuropathy and arthritis.   On endoscopy it was found she has a hiatal hernia, diverticulosis; a small amount of fresh blood was found in the small intestine; she is anemic, had iron infusions, states she may need a port to draw blood since her veins are so small.   She has known c-spine ans L-spine issues.    Diabetic: Yes, 6.4 A1C on 05-04-18,  good control  Tobacco use: former smoker, quit in the 1990's  Pt meds include: Statin :Yes Betablocker: No ASA: yes Other anticoagulants/antiplatelets: no  Past Medical History:  Diagnosis Date  .  Arthritis   . CAD (coronary artery disease)   . Cancer ([place])   . Carotid artery occlusion   . Cataracts, bilateral   . DDD (degenerative disc disease)   . Diabetes mellitus    Type 2  . Diverticulitis   . Dyspnea   . Fibromyalgia   . GERD (gastroesophageal reflux disease)   . Heart murmur   . Hemorrhoids   . History of shingles   . Impaired hearing   . Macular degeneration   . Other and unspecified hyperlipidemia   . PVD (peripheral vascular disease) ([place])   . TIA (transient ischemic attack)   . Unspecified essential hypertension   . Varicose veins     Social History Social History   Tobacco Use  . Smoking status: Former Smoker    Types: Cigarettes    Last attempt to quit: 03/11/1985    Years since quitting: 33.2  . Smokeless tobacco: Never Used  Substance Use Topics  . Alcohol use: No    Alcohol/week: 0.0 standard drinks  . Drug use: No    Family History Family History  Problem Relation Age of Onset  . Stroke Mother   . Hypertension Mother   . Stroke Father   . Hypertension Father   . Diabetes Father   . Coronary artery disease Father   . Heart disease Father   . Diabetes Sister   . Hypertension Sister   . Lung cancer Brother   . Sinusitis Brother   . Healthy Brother   .  Diabetes Son   . Hypertension Son   . Coronary artery disease Other     Past Surgical History:  Procedure Laterality Date  . ANGIOPLASTY     left leg 2003  . APPENDECTOMY    . aspiration of a cyst in the right breast    . BALLOON DILATION N/A 10/01/2013   Procedure: BALLOON DILATION;  Surgeon: Rogene Houston, MD;  [facility];  Service: Endoscopy;  Laterality: N/A;  . BREAST BIOPSY    . BREAST BIOPSY Left   . CARDIAC CATHETERIZATION    . CAROTID ENDARTERECTOMY  12-03-06   left CEA  . CATARACT EXTRACTION W/PHACO Right 10/14/2013   Procedure: CATARACT EXTRACTION PHACO AND INTRAOCULAR LENS PLACEMENT (IOC);  Surgeon: Tonny Branch, MD;  Location: [facility];  Service:  Ophthalmology;  Laterality: Right;  CDE 7.20  . CATARACT EXTRACTION W/PHACO Left 11/04/2013   Procedure: CATARACT EXTRACTION PHACO AND INTRAOCULAR LENS PLACEMENT (IOC);  Surgeon: Tonny Branch, MD;  Location: [facility];  Service: Ophthalmology;  Laterality: Left;  CDE 8.57  . COLONOSCOPY N/A 09/19/2014   Procedure: COLONOSCOPY;  Surgeon: Rogene Houston, MD;  [facility];  Service: Endoscopy;  Laterality: N/A;  7:30-8:30  . CYSTO WITH HYDRODISTENSION N/A 03/29/2016   Procedure: CYSTOSCOPY/HYDRODISTENSION;  Surgeon: Irine Seal, MD;  Location: [facility];  Service: Urology;  Laterality: N/A;  . CYSTOSCOPY WITH INJECTION N/A 03/29/2016   Procedure: CYSTOSCOPY WITH PYRIDIUM AND MARCAINE INSTALLATION;  Surgeon: Irine Seal, MD;  Location: [facility];  Service: Urology;  Laterality: N/A;  . ESOPHAGOGASTRODUODENOSCOPY N/A 10/01/2013   Procedure: ESOPHAGOGASTRODUODENOSCOPY (EGD);  Surgeon: Rogene Houston, MD;  [facility];  Service: Endoscopy;  Laterality: N/A;  120  . ESOPHAGOGASTRODUODENOSCOPY N/A 10/24/2014   Procedure: ESOPHAGOGASTRODUODENOSCOPY (EGD);  Surgeon: Rogene Houston, MD;  [facility];  Service: Endoscopy;  Laterality: N/A;  730  . GIVENS CAPSULE STUDY N/A 10/24/2014   Procedure: GIVENS CAPSULE STUDY;  Surgeon: Rogene Houston, MD;  [facility];  Service: Endoscopy;  Laterality: N/A;  . JOINT REPLACEMENT  2011   Right total knee replacement  . Catarina  . KNEE ARTHROSCOPY  2009  . MALONEY DILATION N/A 10/01/2013   Procedure: Venia Minks DILATION;  Surgeon: Rogene Houston, MD;  [facility];  Service: Endoscopy;  Laterality: N/A;  . SAVORY DILATION N/A 10/01/2013   Procedure: SAVORY DILATION;  Surgeon: Rogene Houston, MD;  [facility];  Service: Endoscopy;  Laterality: N/A;  . SPINE SURGERY  1997   C5-c6 fusion  . TOTAL ABDOMINAL HYSTERECTOMY  1973    Allergies  Allergen Reactions  . Amoxicillin-Pot Clavulanate Hives  and Other (See Comments)    Has patient had a PCN reaction causing immediate rash, facial/tongue/throat swelling, SOB or lightheadedness with hypotension: UNKNOWN Has patient had a PCN reaction causing severe rash involving mucus membranes or skin necrosis: UNKNOWN Has patient had a PCN reaction that required hospitalization No Has patient had a PCN reaction occurring within the last 10 years: No If all of the above answers are "NO", then may proceed with Cephalosporin use.   . Byetta 10 Mcg Pen [Exenatide] Other (See Comments)    Unknown  . Ceclor [Cefaclor] Other (See Comments)    Unknown  . Celexa [Citalopram Hydrobromide] Other (See Comments)    Sick  . Ciprofloxacin Nausea Only and Other (See Comments)    Dizziness  . Claritin [Loratadine] Other (See Comments)  Hair fell out  . Erythromycin Other (See Comments)    REACTION: Upset stomach  . Flexeril [Cyclobenzaprine] Other (See Comments)    Nervous  . Fosamax [Alendronate] Other (See Comments)    Made pt sick  . Jardiance [Empagliflozin] Other (See Comments)    Vaginal infection, UTI  . Motrin [Ibuprofen] Nausea Only  . Naproxen Other (See Comments)    REACTION: severe nausea  . Norvasc [Amlodipine Besylate] Other (See Comments)    Unknown  . Prednisone Other (See Comments)    REACTION: itchy rash  . Prozac [Fluoxetine Hcl] Other (See Comments)    Sick  . Sulfamethoxazole-Trimethoprim Nausea And Vomiting and Other (See Comments)    REACTION: Upset stomach    Current Outpatient Medications  Medication Sig Dispense Refill  . Alum Hydroxide-Mag Trisilicate (GAVISCON) 08-67.6 MG CHEW Chew 2-3 tablets by mouth daily as needed (acid reflux).     . Carboxymeth-Glycerin-Polysorb (REFRESH OPTIVE ADVANCED) 0.5-1-0.5 % SOLN Apply 1 drop to eye daily as needed (for dry eyes).     . cloNIDine (CATAPRES) 0.1 MG tablet Take 0.1-0.2 mg by mouth See admin instructions. TAKES 0.1 MG IN THE MORNING AND 0.2 MG AT BEDTIME    .  Darbepoetin Alfa 60 MCG/ML SOLN Inject into the skin every 30 (thirty) days.    Marland Kitchen esomeprazole (NEXIUM) 20 MG capsule Take 20 mg by mouth daily before breakfast.     . fexofenadine (ALLEGRA) 30 MG/5ML suspension Take 30 mg by mouth daily as needed (for allergies). TAKES CHILDREN'S LIQUID     . gabapentin (NEURONTIN) 100 MG capsule Take 1 capsule by mouth 3 (three) times daily as needed.    Marland Kitchen GLOBAL EASE INJECT PEN NEEDLES 32G X 4 MM MISC USE ONCE DAILY WITH SOLOSTAR INSULIN 100 each 3  . hydrALAZINE (APRESOLINE) 25 MG tablet Take 25 mg by mouth daily.     . Insulin Glargine (LANTUS SOLOSTAR) 100 UNIT/ML Solostar Pen INJECT 50 UNITS INTO THE SKIN EVERY DAY AT 10 IN THE EVENING 5 pen 2  . ketotifen (ALAWAY) 0.025 % ophthalmic solution Place 1 drop into both eyes daily as needed (for allergies).     Marland Kitchen levothyroxine (SYNTHROID, LEVOTHROID) 75 MCG tablet Take 1 tablet (75 mcg total) by mouth daily before breakfast. 30 tablet 5  . losartan (COZAAR) 100 MG tablet Take 100 mg by mouth daily.      . Methylcellulose, Laxative, (CITRUCEL PO) Take 1 each by mouth every other day.    . Multiple Vitamins-Minerals (PRESERVISION AREDS 2 PO) Take 1-2 tablets by mouth See admin instructions. Take 1 capsule by mouth every other day alternating with 2 capsules by mouth every other day    . NIFEdipine (PROCARDIA-XL/ADALAT-CC/NIFEDICAL-XL) 30 MG 24 hr tablet Take 30 mg by mouth daily.    Marland Kitchen Propylene Glycol (SYSTANE BALANCE OP) Apply 1 drop to eye daily as needed (for dry eyes).     . simvastatin (ZOCOR) 10 MG tablet Take 10 mg by mouth every other day.    . torsemide (DEMADEX) 20 MG tablet Take 20 mg by mouth daily.     . traZODone (DESYREL) 100 MG tablet Take 100 mg by mouth at bedtime.     No current facility-administered medications for this visit.    Facility-Administered Medications Ordered in Other Visits  Medication Dose Route Frequency Provider Last Rate Last Dose  . vancomycin (VANCOCIN) 1,000 mg in sodium  chloride 0.9 % 500 mL IVPB  1,000 mg Intravenous Once Perkins, Alexzandrew L, PA-C  ROS: See HPI for pertinent positives and negatives.   Physical Examination  Vitals:   05/19/18 1315  BP: (!) 115/48  Pulse: 63  Resp: 18  Temp: (!) 97.2 F (36.2 C)  SpO2: 97%  Weight: 201 lb 11.5 oz (91.5 kg)  Height: 5\' 3"  (1.6 m)   Body mass index is 35.73 kg/m.  General: A&O x 3, WDWN, obese female. Gait: slow, steady, using cane HENT: No gross abnormalities. Mildly pale conjunctiva  Eyes: PERRLA. Pulmonary: Respirations are non labored, CTAB, good air movement in all fields Cardiac: Irregular rhythm with controlled rate, +murmur.         Carotid Bruits Right Left   Positive Positive   Radial pulses are 2+ palpable bilaterally   Adominal aortic pulse is not palpable                          VASCULAR EXAM: Extremities without ischemic changes, without Gangrene; without open wounds.                                                                                                          LE Pulses Right Left       POPLITEAL  not palpable   not palpable       POSTERIOR TIBIAL  not palpable   faintly palpable        DORSALIS PEDIS      ANTERIOR TIBIAL not palpable  not palpable    Abdomen: soft, NT, no palpable masses. Skin: no rashes, no cellulitis, no ulcers noted. 1-2+ pitting edema in ankles.  Musculoskeletal: no muscle wasting or atrophy.  Neurologic: A&O X 3; appropriate affect, Sensation is diminished to light touch at right foot, and painful to touch; MOTOR FUNCTION:  moving all extremities equally, motor strength 5/5 in UE's, 3/5 in right LE, 4/5 in left LE. Speech is fluent/normal. CN 2-12 intact. Psychiatric: Thought content is normal, mood appropriate for clinical situation.    ASSESSMENT: Debra Hopkins is a 78 y.o. female whois status post left CEA in September 2008, left SFA PTA with stent in 2003. She had a TIA in 1995, none since then.  Her walking  seems limited by severe fatigue from her chemo tx for bone marrow cancer.  There are no signs of ischemia in her feet or legs.   Pt denies any known hx of atrial fib or any cardiac arrhythmias. Her cardiac rhythm now is irregular. She is fatigued from bone marrow cancer, chemo, and thrombocytopenia. She denies any worse fatigue than usual, denies dyspnea except for when she is receiving chemo.  She denies seeing a cardiologist for any reason. I advised her to call her PCP today and ask if she needs an EKG or an exam by her PCP, or referral to a cardiologist if deemed necessary.  She states that she occasionally feels her heart racing.   Pt states the pitting edema in her ankles resolves with elevation of her feet.  Her atherosclerotic risk factors include currently controled DM, former smoker,  CAD, and obesity.    DATA  ABI (Date: 05/19/2018):  ABI Findings: +---------+------------------+-----+-------------------+--------+ Right    Rt Pressure (mmHg)IndexWaveform           Comment  +---------+------------------+-----+-------------------+--------+ Brachial 127                                                +---------+------------------+-----+-------------------+--------+ ATA      109               0.80 dampened monophasic         +---------+------------------+-----+-------------------+--------+ PTA      52                0.38 dampened monophasic         +---------+------------------+-----+-------------------+--------+ Great Toe22                0.16                             +---------+------------------+-----+-------------------+--------+  +---------+------------------+-----+----------+-------+ Left     Lt Pressure (mmHg)IndexWaveform  Comment +---------+------------------+-----+----------+-------+ Brachial 137                                      +---------+------------------+-----+----------+-------+ ATA      128               0.93  monophasic        +---------+------------------+-----+----------+-------+ PTA      99                0.72 monophasic        +---------+------------------+-----+----------+-------+ Great Toe62                0.45                   +---------+------------------+-----+----------+-------+  +-------+-----------+-----------+------------+------------+ ABI/TBIToday's ABIToday's TBIPrevious ABIPrevious TBI +-------+-----------+-----------+------------+------------+ Right  0.8        0.16       0.47        0.34         +-------+-----------+-----------+------------+------------+ Left   0.93       0.45       0.67        0.42         +-------+-----------+-----------+------------+------------+  Previous ABI on 04/08/17.   Summary: Right: Resting right ankle-brachial index indicates mild right lower extremity arterial disease. The right toe-brachial index is abnormal. ABIs are unreliable. RT great toe pressure = 22 mmHg.  Left: Resting left ankle-brachial index indicates mild left lower extremity arterial disease. The left toe-brachial index is abnormal. TBIs are unreliable. LT Great toe pressure = 62 mmHg.   Carotid Duplex (04/08/17): Right ICA: 1-39% stenosis Right ECA: >50% stenosis Left ICA: (CEA site), no stenosis Bilateral vertebral artery flow is antegrade.  Bilateral subclavian artery waveforms are normal.  No significant change since the exams on 03-28-15 and 04-02-16.    Plan:  Daily seated leg exercises as discussed and demonstrated.   Based on today's exam and non-invasive vascular lab results, the patient will follow up in1year with ABI's and carotid Duplex. I advised her to notify us if she develops concerns re the circulation in her feet or legs.  I discussed in depth with the patient  the nature of atherosclerosis, and emphasized the importance of maximal medical management including strict control of blood pressure, blood glucose, and  lipid levels, obtaining regular exercise, and continued cessation of smoking.  The patient is aware that without maximal medical management the underlying atherosclerotic disease process will progress, limiting the benefit of any interventions.  The patient was given information about PAD including signs, symptoms, treatment, what symptoms should prompt the patient to seek immediate medical care, and risk reduction measures to take.  Clemon Chambers, RN, MSN, FNP-C [facility] Phone: [phone]  Clinic MD: Bishop Dublin  05/19/18 1:20 PM

## 2018-05-20 ENCOUNTER — Other Ambulatory Visit: Payer: Self-pay | Admitting: "Endocrinology

## 2018-08-01 ENCOUNTER — Other Ambulatory Visit: Payer: Self-pay | Admitting: "Endocrinology

## 2018-08-25 ENCOUNTER — Other Ambulatory Visit: Payer: Self-pay | Admitting: "Endocrinology

## 2018-09-01 ENCOUNTER — Other Ambulatory Visit: Payer: Self-pay | Admitting: "Endocrinology

## 2018-10-15 ENCOUNTER — Telehealth: Payer: Self-pay | Admitting: "Endocrinology

## 2018-10-15 NOTE — Telephone Encounter (Signed)
Pts readings were 115, 142.   Per Dr Dorris Fetch. Pt ok to wait until her visit on 9-2

## 2018-10-15 NOTE — Telephone Encounter (Signed)
Get blood glucose readings for 3 days.

## 2018-10-15 NOTE — Telephone Encounter (Signed)
Patient left a VM that she recently was in the hospital and rehab and they have changed her regimen. She would like to know do you want to see her before 9/2?

## 2018-11-04 ENCOUNTER — Other Ambulatory Visit: Payer: Self-pay | Admitting: "Endocrinology

## 2018-11-05 LAB — COMPREHENSIVE METABOLIC PANEL
ALT: 13 IU/L (ref 0–32)
AST: 17 IU/L (ref 0–40)
Albumin/Globulin Ratio: 2.8 — ABNORMAL HIGH (ref 1.2–2.2)
Albumin: 4.5 g/dL (ref 3.7–4.7)
Alkaline Phosphatase: 71 IU/L (ref 39–117)
BUN/Creatinine Ratio: 20 (ref 12–28)
BUN: 24 mg/dL (ref 8–27)
Bilirubin Total: 0.6 mg/dL (ref 0.0–1.2)
CO2: 24 mmol/L (ref 20–29)
Calcium: 9.6 mg/dL (ref 8.7–10.3)
Chloride: 96 mmol/L (ref 96–106)
Creatinine, Ser: 1.18 mg/dL — ABNORMAL HIGH (ref 0.57–1.00)
GFR calc Af Amer: 51 mL/min/{1.73_m2} — ABNORMAL LOW (ref >59)
GFR calc non Af Amer: 44 mL/min/{1.73_m2} — ABNORMAL LOW (ref >59)
Globulin, Total: 1.6 g/dL (ref 1.5–4.5)
Glucose: 95 mg/dL (ref 65–99)
Potassium: 6.1 mmol/L — ABNORMAL HIGH (ref 3.5–5.2)
Sodium: 137 mmol/L (ref 134–144)
Total Protein: 6.1 g/dL (ref 6.0–8.5)

## 2018-11-05 LAB — T4, FREE: Free T4: 1.42 ng/dL (ref 0.82–1.77)

## 2018-11-05 LAB — HGB A1C W/O EAG: Hgb A1c MFr Bld: 5.5 % (ref 4.8–5.6)

## 2018-11-05 LAB — SPECIMEN STATUS REPORT

## 2018-11-05 LAB — TSH: TSH: 3.86 u[IU]/mL (ref 0.450–4.500)

## 2018-11-11 ENCOUNTER — Ambulatory Visit (INDEPENDENT_AMBULATORY_CARE_PROVIDER_SITE_OTHER): Payer: Medicare Other | Admitting: "Endocrinology

## 2018-11-11 ENCOUNTER — Other Ambulatory Visit: Payer: Self-pay

## 2018-11-11 ENCOUNTER — Encounter: Payer: Self-pay | Admitting: "Endocrinology

## 2018-11-11 DIAGNOSIS — E782 Mixed hyperlipidemia: Secondary | ICD-10-CM

## 2018-11-11 DIAGNOSIS — I129 Hypertensive chronic kidney disease with stage 1 through stage 4 chronic kidney disease, or unspecified chronic kidney disease: Secondary | ICD-10-CM | POA: Diagnosis not present

## 2018-11-11 DIAGNOSIS — E1122 Type 2 diabetes mellitus with diabetic chronic kidney disease: Secondary | ICD-10-CM | POA: Diagnosis not present

## 2018-11-11 DIAGNOSIS — I10 Essential (primary) hypertension: Secondary | ICD-10-CM

## 2018-11-11 DIAGNOSIS — E038 Other specified hypothyroidism: Secondary | ICD-10-CM

## 2018-11-11 DIAGNOSIS — N183 Chronic kidney disease, stage 3 (moderate): Secondary | ICD-10-CM

## 2018-11-11 MED ORDER — LANTUS SOLOSTAR 100 UNIT/ML ~~LOC~~ SOPN: PEN_INJECTOR | SUBCUTANEOUS | 2 refills | Status: DC

## 2018-11-11 NOTE — Progress Notes (Signed)
11/11/2018                                                    Endocrinology Telehealth Visit Follow up Note -During COVID -19 Pandemic  This visit type was conducted due to national recommendations for restrictions regarding the COVID-19 Pandemic  in an effort to limit this patient's exposure and mitigate transmission of the corona virus.  Due to her co-morbid illnesses, Debra Hopkins is at  moderate to high risk for complications without adequate follow up.  This format is felt to be most appropriate for her at this time.  I connected with this patient on 11/11/2018   by telephone and verified that I am speaking with the correct person using two identifiers. Debra Hopkins, [birth year]. she has verbally consented to this visit. All issues noted in this document were discussed and addressed. The format was not optimal for physical exam.    Subjective:    Patient ID: Debra Hopkins, female    DOB: 02-03-1941. Patient is being engaged in telehealth via telephone in follow up for management of chronic and uncontrolled type 2 diabetes, hyperlipidemia, hypertension.   PMD:   Monico Blitz, MD  Past Medical History:  Diagnosis Date  . Arthritis   . CAD (coronary artery disease)   . Cancer ([place])   . Carotid artery occlusion   . Cataracts, bilateral   . DDD (degenerative disc disease)   . Diabetes mellitus    Type 2  . Diverticulitis   . Dyspnea   . Fibromyalgia   . GERD (gastroesophageal reflux disease)   . Heart murmur   . Hemorrhoids   . History of shingles   . Impaired hearing   . Macular degeneration   . Other and unspecified hyperlipidemia   . PVD (peripheral vascular disease) ([place])   . TIA (transient ischemic attack)   . Unspecified essential hypertension   . Varicose veins    Past Surgical History:  Procedure Laterality Date  . ANGIOPLASTY     left leg 2003  . APPENDECTOMY    . aspiration of a cyst in the right breast    . BALLOON DILATION N/A 10/01/2013   Procedure: BALLOON  DILATION;  Surgeon: Rogene Houston, MD;  [facility];  Service: Endoscopy;  Laterality: N/A;  . BREAST BIOPSY    . BREAST BIOPSY Left   . CARDIAC CATHETERIZATION    . CAROTID ENDARTERECTOMY  12-03-06   left CEA  . CATARACT EXTRACTION W/PHACO Right 10/14/2013   Procedure: CATARACT EXTRACTION PHACO AND INTRAOCULAR LENS PLACEMENT (IOC);  Surgeon: Tonny Branch, MD;  Location: [facility];  Service: Ophthalmology;  Laterality: Right;  CDE 7.20  . CATARACT EXTRACTION W/PHACO Left 11/04/2013   Procedure: CATARACT EXTRACTION PHACO AND INTRAOCULAR LENS PLACEMENT (IOC);  Surgeon: Tonny Branch, MD;  Location: [facility];  Service: Ophthalmology;  Laterality: Left;  CDE 8.57  . COLONOSCOPY N/A 09/19/2014   Procedure: COLONOSCOPY;  Surgeon: Rogene Houston, MD;  [facility];  Service: Endoscopy;  Laterality: N/A;  7:30-8:30  . CYSTO WITH HYDRODISTENSION N/A 03/29/2016   Procedure: CYSTOSCOPY/HYDRODISTENSION;  Surgeon: Irine Seal, MD;  Location: [facility];  Service: Urology;  Laterality: N/A;  . CYSTOSCOPY WITH INJECTION N/A 03/29/2016   Procedure: CYSTOSCOPY WITH PYRIDIUM AND MARCAINE INSTALLATION;  Surgeon: Irine Seal, MD;  Location: [facility];  Service: Urology;  Laterality: N/A;  . ESOPHAGOGASTRODUODENOSCOPY N/A 10/01/2013   Procedure: ESOPHAGOGASTRODUODENOSCOPY (EGD);  Surgeon: Rogene Houston, MD;  [facility];  Service: Endoscopy;  Laterality: N/A;  120  . ESOPHAGOGASTRODUODENOSCOPY N/A 10/24/2014   Procedure: ESOPHAGOGASTRODUODENOSCOPY (EGD);  Surgeon: Rogene Houston, MD;  [facility];  Service: Endoscopy;  Laterality: N/A;  730  . GIVENS CAPSULE STUDY N/A 10/24/2014   Procedure: GIVENS CAPSULE STUDY;  Surgeon: Rogene Houston, MD;  [facility];  Service: Endoscopy;  Laterality: N/A;  . JOINT REPLACEMENT  2011   Right total knee replacement  . [place]  . KNEE ARTHROSCOPY  2009  . MALONEY DILATION N/A 10/01/2013   Procedure: Venia Minks DILATION;   Surgeon: Rogene Houston, MD;  [facility];  Service: Endoscopy;  Laterality: N/A;  . SAVORY DILATION N/A 10/01/2013   Procedure: SAVORY DILATION;  Surgeon: Rogene Houston, MD;  [facility];  Service: Endoscopy;  Laterality: N/A;  . SPINE SURGERY  1997   C5-c6 fusion  . TOTAL ABDOMINAL HYSTERECTOMY  1973   Social History   Socioeconomic History  . Marital status: Widowed    Spouse name: Not on file  . Number of children: Not on file  . Years of education: Not on file  . Highest education level: Not on file  Occupational History  . Not on file  Social Needs  . Financial resource strain: Not on file  . Food insecurity    Worry: Not on file    Inability: Not on file  . Transportation needs    Medical: Not on file    Non-medical: Not on file  Tobacco Use  . Smoking status: Former Smoker    Types: Cigarettes    Quit date: 03/11/1985    Years since quitting: 33.6  . Smokeless tobacco: Never Used  Substance and Sexual Activity  . Alcohol use: No    Alcohol/week: 0.0 standard drinks  . Drug use: No  . Sexual activity: Not on file  Lifestyle  . Physical activity    Days per week: Not on file    Minutes per session: Not on file  . Stress: Not on file  Relationships  . Social Herbalist on phone: Not on file    Gets together: Not on file    Attends religious service: Not on file    Active member of club or organization: Not on file    Attends meetings of clubs or organizations: Not on file    Relationship status: Not on file  Other Topics Concern  . Not on file  Social History Narrative   Retired. Married.    Outpatient Encounter Medications as of 11/11/2018  Medication Sig  . ACCU-CHEK AVIVA PLUS test strip TEST FOUR TIMES DAILY AS DIRECTED  . Accu-Chek Softclix Lancets lancets USE ONE DAILY AS DIRECTED - THIS IS A 100 DAY SUPPLY  . Alum Hydroxide-Mag Trisilicate (GAVISCON) [id number] MG CHEW Chew 2-3 tablets by mouth daily as needed (acid  reflux).   . Carboxymeth-Glycerin-Polysorb (REFRESH OPTIVE ADVANCED) 0.5-1-0.5 % SOLN Apply 1 drop to eye daily as needed (for dry eyes).   . cloNIDine (CATAPRES) 0.1 MG tablet Take 0.1-0.2 mg by mouth See admin instructions. TAKES 0.1 MG IN THE MORNING AND 0.2 MG AT BEDTIME  . Darbepoetin Alfa 60 MCG/ML SOLN Inject into the skin every 30 (thirty) days.  Marland Kitchen esomeprazole ([place]) 20  MG capsule Take 20 mg by mouth daily before breakfast.   . fexofenadine (ALLEGRA) 30 MG/5ML suspension Take 30 mg by mouth daily as needed (for allergies). TAKES CHILDREN'S LIQUID   . gabapentin (NEURONTIN) 100 MG capsule Take 1 capsule by mouth 3 (three) times daily as needed.  Marland Kitchen GLOBAL EASE INJECT PEN NEEDLES 32G X 4 MM MISC USE ONCE DAILY WITH SOLOSTAR INSULIN  . hydrALAZINE (APRESOLINE) 25 MG tablet Take 25 mg by mouth daily.   . Insulin Glargine (LANTUS SOLOSTAR) 100 UNIT/ML Solostar Pen INJECT 30 UNITS INTO THE SKIN EVERY DAY AT 10:00 PM  . ketotifen (ALAWAY) 0.025 % ophthalmic solution Place 1 drop into both eyes daily as needed (for allergies).   Marland Kitchen levothyroxine (SYNTHROID, LEVOTHROID) 75 MCG tablet Take 1 tablet (75 mcg total) by mouth daily before breakfast.  . losartan (COZAAR) 100 MG tablet Take 100 mg by mouth daily.    . Methylcellulose, Laxative, (CITRUCEL PO) Take 1 each by mouth every other day.  . Multiple Vitamins-Minerals (PRESERVISION AREDS 2 PO) Take 1-2 tablets by mouth See admin instructions. Take 1 capsule by mouth every other day alternating with 2 capsules by mouth every other day  . NIFEdipine (PROCARDIA-XL/ADALAT-CC/NIFEDICAL-XL) 30 MG 24 hr tablet Take 30 mg by mouth daily.  Marland Kitchen Propylene Glycol (SYSTANE BALANCE OP) Apply 1 drop to eye daily as needed (for dry eyes).   . simvastatin (ZOCOR) 10 MG tablet Take 10 mg by mouth every other day.  . torsemide (DEMADEX) 20 MG tablet Take 20 mg by mouth daily.   . traZODone (DESYREL) 100 MG tablet Take 100 mg by mouth at bedtime.  . [DISCONTINUED]  Insulin Glargine (LANTUS SOLOSTAR) 100 UNIT/ML Solostar Pen INJECT 50 UNITS INTO THE SKIN EVERY DAY AT 10:00 PM   Facility-Administered Encounter Medications as of 11/11/2018  Medication  . vancomycin (VANCOCIN) 1,000 mg in sodium chloride 0.9 % 500 mL IVPB   ALLERGIES: Allergies  Allergen Reactions  . Amoxicillin-Pot Clavulanate Hives and Other (See Comments)    Has patient had a PCN reaction causing immediate rash, facial/tongue/throat swelling, SOB or lightheadedness with hypotension: UNKNOWN Has patient had a PCN reaction causing severe rash involving mucus membranes or skin necrosis: UNKNOWN Has patient had a PCN reaction that required hospitalization No Has patient had a PCN reaction occurring within the last 10 years: No If all of the above answers are "NO", then may proceed with Cephalosporin use.   . Byetta 10 Mcg Pen [Exenatide] Other (See Comments)    Unknown  . Ceclor [Cefaclor] Other (See Comments)    Unknown  . Celexa [Citalopram Hydrobromide] Other (See Comments)    Sick  . Ciprofloxacin Nausea Only and Other (See Comments)    Dizziness  . Claritin [Loratadine] Other (See Comments)    Hair fell out  . Erythromycin Other (See Comments)    REACTION: Upset stomach  . Flexeril [Cyclobenzaprine] Other (See Comments)    Nervous  . Fosamax [Alendronate] Other (See Comments)    Made pt sick  . Jardiance [Empagliflozin] Other (See Comments)    Vaginal infection, UTI  . Motrin [Ibuprofen] Nausea Only  . Naproxen Other (See Comments)    REACTION: severe nausea  . Norvasc [Amlodipine Besylate] Other (See Comments)    Unknown  . Prednisone Other (See Comments)    REACTION: itchy rash  . Prozac [Fluoxetine Hcl] Other (See Comments)    Sick  . Sulfamethoxazole-Trimethoprim Nausea And Vomiting and Other (See Comments)    REACTION: Upset stomach  VACCINATION STATUS:  There is no immunization history on file for this patient.  Diabetes She presents for her follow-up  diabetic visit. She has type 2 diabetes mellitus. Onset time: She was diagnosed at approximate age of 43 years. Her disease course has been fluctuating. There are no hypoglycemic associated symptoms. Pertinent negatives for hypoglycemia include no confusion, headaches, pallor or seizures. Associated symptoms include fatigue. Pertinent negatives for diabetes include no chest pain, no polydipsia, no polyphagia and no polyuria. There are no hypoglycemic complications. Symptoms are improving. Diabetic complications include nephropathy. Risk factors for coronary artery disease include dyslipidemia, diabetes mellitus, hypertension, obesity, sedentary lifestyle and tobacco exposure (She smoked for 28 years before she quit.). Current diabetic treatment includes insulin injections. Her weight is decreasing steadily. She is following a generally unhealthy diet. When asked about meal planning, she reported none. She has not had a previous visit with a dietitian. She rarely participates in exercise. Her home blood glucose trend is decreasing steadily. Her breakfast blood glucose range is generally 90-110 mg/dl. Her bedtime blood glucose range is generally 130-140 mg/dl. Her overall blood glucose range is 130-140 mg/dl. An ACE inhibitor/angiotensin II receptor blocker is being taken. Eye exam is current.  Hyperlipidemia This is a chronic problem. The current episode started more than 1 year ago. The problem is controlled. Recent lipid tests were reviewed and are variable. Exacerbating diseases include diabetes, hypothyroidism and obesity. Pertinent negatives include no chest pain, myalgias or shortness of breath. Current antihyperlipidemic treatment includes statins. Risk factors for coronary artery disease include diabetes mellitus, dyslipidemia, hypertension, family history, obesity and a sedentary lifestyle.  Hypertension This is a chronic problem. The current episode started more than 1 year ago. The problem is  controlled. Pertinent negatives include no chest pain, headaches, palpitations or shortness of breath. Risk factors for coronary artery disease include diabetes mellitus, dyslipidemia, obesity, sedentary lifestyle and smoking/tobacco exposure. Past treatments include angiotensin blockers. Hypertensive end-organ damage includes kidney disease.     Objective:    There were no vitals taken for this visit.  Wt Readings from Last 3 Encounters:  05/19/18 201 lb 11.5 oz (91.5 kg)  05/11/18 195 lb 3.2 oz (88.5 kg)  11/06/17 199 lb (90.3 kg)     CMP     Component Value Date/Time   NA 137 11/04/2018 1132   K 6.1 (H) 11/04/2018 1132   CL 96 11/04/2018 1132   CO2 24 11/04/2018 1132   GLUCOSE 95 11/04/2018 1132   GLUCOSE 246 (H) 12/18/2016 1508   BUN 24 11/04/2018 1132   CREATININE 1.18 (H) 11/04/2018 1132   CALCIUM 9.6 11/04/2018 1132   PROT 6.1 11/04/2018 1132   ALBUMIN 4.5 11/04/2018 1132   AST 17 11/04/2018 1132   ALT 13 11/04/2018 1132   ALKPHOS 71 11/04/2018 1132   BILITOT 0.6 11/04/2018 1132   GFRNONAA 44 (L) 11/04/2018 1132   GFRAA 51 (L) 11/04/2018 1132   Recent Results (from the past 2160 hour(s))  Comprehensive metabolic panel     Status: Abnormal   Collection Time: 11/04/18 11:32 AM  Result Value Ref Range   Glucose 95 65 - 99 mg/dL   BUN 24 8 - 27 mg/dL   Creatinine, Ser 1.18 (H) 0.57 - 1.00 mg/dL   GFR calc non Af Amer 44 (L) >59 mL/min/1.73   GFR calc Af Amer 51 (L) >59 mL/min/1.73   BUN/Creatinine Ratio 20 12 - 28   Sodium 137 134 - 144 mmol/L   Potassium 6.1 (H) 3.5 -  5.2 mmol/L   Chloride 96 96 - 106 mmol/L   CO2 24 20 - 29 mmol/L   Calcium 9.6 8.7 - 10.3 mg/dL   Total Protein 6.1 6.0 - 8.5 g/dL   Albumin 4.5 3.7 - 4.7 g/dL   Globulin, Total 1.6 1.5 - 4.5 g/dL   Albumin/Globulin Ratio 2.8 (H) 1.2 - 2.2   Bilirubin Total 0.6 0.0 - 1.2 mg/dL   Alkaline Phosphatase 71 39 - 117 IU/L   AST 17 0 - 40 IU/L   ALT 13 0 - 32 IU/L  Hgb A1c w/o eAG     Status: None    Collection Time: 11/04/18 11:32 AM  Result Value Ref Range   Hgb A1c MFr Bld 5.5 4.8 - 5.6 %    Comment:          Prediabetes: 5.7 - 6.4          Diabetes: >6.4          Glycemic control for adults with diabetes: <7.0   T4, free     Status: None   Collection Time: 11/04/18 11:32 AM  Result Value Ref Range   Free T4 1.42 0.82 - 1.77 ng/dL  TSH     Status: None   Collection Time: 11/04/18 11:32 AM  Result Value Ref Range   TSH 3.860 0.450 - 4.500 uIU/mL  Specimen status report     Status: None   Collection Time: 11/04/18 11:32 AM  Result Value Ref Range   specimen status report Comment     Comment: [facility] CMP14 Default Ambig Abbrev CMP14 Default A hand-written panel/profile was received from your office. In accordance with the [facility] Ambiguous Test Code Policy dated July 123456, we have completed your order by using the closest currently or formerly recognized AMA panel.  We have assigned Comprehensive Metabolic Panel (14), Test Code #322000 to this request.  If this is not the testing you wished to receive on this specimen, please contact the [facility] Client Inquiry/Technical Services Department to clarify the test order.  We appreciate your business.    Lipid Panel     Component Value Date/Time   CHOL 135 05/04/2018 1136   TRIG 94 05/04/2018 1136   HDL 38 (L) 05/04/2018 1136   LDLCALC 78 05/04/2018 1136    Assessment & Plan:   1. Type 2 diabetes mellitus with stage 3 chronic kidney disease, without long-term current use of insulin ([place])  - Patient has currently uncontrolled symptomatic type 2 DM since  78 years of age. - She reports significant hypoglycemic episodes, and A1c of 5.5% , however this patient had anemia with hematocrit of 29%.  This can underestimate the A1c.    - Recent labs reviewed.    Her diabetes is complicated by chronic kidney disease, improving since last visit, she remains at a high risk for more acute and chronic complications of  diabetes which include CAD, CVA, CKD, retinopathy, and neuropathy. These are all discussed in detail with the patient.  - I have counseled the patient on diet management and weight loss, by adopting a carbohydrate restricted/protein rich diet.  -  Suggestion is made for her to avoid simple carbohydrates  from her diet including Cakes, Sweet Desserts / Pastries, Ice Cream, Soda (diet and regular), Sweet Tea, Candies, Chips, Cookies, Sweet Pastries,  Store Bought Juices, Alcohol in Excess of  1-2 drinks a day, Artificial Sweeteners, Coffee Creamer, and "Sugar-free" Products. This will help patient to have stable blood glucose profile and  potentially avoid unintended weight gain.  - I encouraged the patient to switch to  unprocessed or minimally processed complex starch and increased protein intake (animal or plant source), fruits, and vegetables.  - Patient is advised to stick to a routine mealtimes to eat 3 meals  a day and avoid unnecessary snacks ( to snack only to correct hypoglycemia).   - I have approached patient with the following individualized plan to manage diabetes and patient agrees:   -Due to her high frequency of hypoglycemic episodes, she is advised to lower her Lantus to 30 units nightly, associated with   strict monitoring of blood glucose 2 times a day-daily before breakfast and at bedtime.    -She is still not a candidate for metformin therapy due to CKD due to unfavorable renal function, although it is improving.   - She is not a candidate for  SGLT2 inhibitors  at this time.  - Patient specific target  A1c;  LDL, HDL, Triglycerides, and  Waist Circumference were discussed in detail.  2) BP/HTN: she is advised to home monitor blood pressure and report if > 140/90 on 2 separate readings. She is advised to continue current medications including losartan 100 mg p.o. daily.      3) Lipids/HPL: Her recent lipid panel showed controlled LDL 78.  She is advised to continue  simvastatin 10 mg p.o. nightly.  Side effects and precautions discussed with her.  4)  Weight/Diet: CDE Consult is in progress  , exercise, and detailed carbohydrates information provided.  5) Chronic Care/Health Maintenance:  -Patient is on ACEI/ARB and Statin medications and encouraged to continue to follow up with Ophthalmology, Podiatrist at least yearly or according to recommendations, and advised to   stay away from smoking. I have recommended yearly flu vaccine and pneumonia vaccination at least every 5 years; moderate intensity exercise for up to 150 minutes weekly; and  sleep for at least 7 hours a day.  - I advised patient to maintain close follow up with Monico Blitz, MD for primary care needs.  - Patient Care Time Today:  25 min, of which >50% was spent in  counseling and the rest reviewing her  current and  previous labs/studies, previous treatments, her blood glucose readings, and medications' doses and developing a plan for long-term care based on the latest recommendations for standards of care.   Debra Hopkins participated in the discussions, expressed understanding, and voiced agreement with the above plans.  All questions were answered to her satisfaction. she is encouraged to contact clinic should she have any questions or concerns prior to her return visit.    Follow up plan: - Return in about 10 days (around 11/21/2018) for Follow up with Meter and Logs Only - no Labs.  Glade Lloyd, MD Phone: [phone]  Fax: [phone]   -  This note was partially dictated with voice recognition software. Similar sounding words can be transcribed inadequately or may not  be corrected upon review.  11/11/2018, 5:31 PM

## 2018-11-17 ENCOUNTER — Other Ambulatory Visit: Payer: Self-pay | Admitting: "Endocrinology

## 2018-11-23 ENCOUNTER — Ambulatory Visit: Payer: Medicare Other | Admitting: "Endocrinology

## 2018-11-24 ENCOUNTER — Ambulatory Visit: Payer: Medicare Other | Admitting: "Endocrinology

## 2018-11-24 ENCOUNTER — Other Ambulatory Visit: Payer: Self-pay

## 2018-11-26 ENCOUNTER — Ambulatory Visit (INDEPENDENT_AMBULATORY_CARE_PROVIDER_SITE_OTHER): Payer: Medicare Other | Admitting: "Endocrinology

## 2018-11-26 ENCOUNTER — Encounter: Payer: Self-pay | Admitting: "Endocrinology

## 2018-11-26 ENCOUNTER — Other Ambulatory Visit: Payer: Self-pay

## 2018-11-26 DIAGNOSIS — E1122 Type 2 diabetes mellitus with diabetic chronic kidney disease: Secondary | ICD-10-CM

## 2018-11-26 DIAGNOSIS — I10 Essential (primary) hypertension: Secondary | ICD-10-CM

## 2018-11-26 DIAGNOSIS — N183 Chronic kidney disease, stage 3 (moderate): Secondary | ICD-10-CM | POA: Diagnosis not present

## 2018-11-26 DIAGNOSIS — E782 Mixed hyperlipidemia: Secondary | ICD-10-CM | POA: Diagnosis not present

## 2018-11-26 NOTE — Progress Notes (Signed)
11/26/2018                                                    Endocrinology Telehealth Visit Follow up Note -During COVID -19 Pandemic  This visit type was conducted due to national recommendations for restrictions regarding the COVID-19 Pandemic  in an effort to limit this patient's exposure and mitigate transmission of the corona virus.  Due to her co-morbid illnesses, Debra Hopkins is at  moderate to high risk for complications without adequate follow up.  This format is felt to be most appropriate for her at this time.  I connected with this patient on 11/26/2018   by telephone and verified that I am speaking with the correct person using two identifiers. Debra Hopkins, 19-Apr-1940. she has verbally consented to this visit. All issues noted in this document were discussed and addressed. The format was not optimal for physical exam.    Subjective:    Patient ID: Debra Hopkins, female    DOB: 02/01/1941. Patient is being engaged in telehealth via telephone in follow up for management of chronic and uncontrolled type 2 diabetes, hyperlipidemia, hypertension.   PMD:   Debra Blitz, MD  Past Medical History:  Diagnosis Date  . Arthritis   . CAD (coronary artery disease)   . Cancer ([place])   . Carotid artery occlusion   . Cataracts, bilateral   . DDD (degenerative disc disease)   . Diabetes mellitus    Type 2  . Diverticulitis   . Dyspnea   . Fibromyalgia   . GERD (gastroesophageal reflux disease)   . Heart murmur   . Hemorrhoids   . History of shingles   . Impaired hearing   . Macular degeneration   . Other and unspecified hyperlipidemia   . PVD (peripheral vascular disease) ([place])   . TIA (transient ischemic attack)   . Unspecified essential hypertension   . Varicose veins    Past Surgical History:  Procedure Laterality Date  . ANGIOPLASTY     left leg 2003  . APPENDECTOMY    . aspiration of a cyst in the right breast    . BALLOON DILATION N/A 10/01/2013   Procedure: BALLOON  DILATION;  Surgeon: Rogene Houston, MD;  [facility];  Service: Endoscopy;  Laterality: N/A;  . BREAST BIOPSY    . BREAST BIOPSY Left   . CARDIAC CATHETERIZATION    . CAROTID ENDARTERECTOMY  12-03-06   left CEA  . CATARACT EXTRACTION W/PHACO Right 10/14/2013   Procedure: CATARACT EXTRACTION PHACO AND INTRAOCULAR LENS PLACEMENT (IOC);  Surgeon: Tonny Branch, MD;  Location: [facility];  Service: Ophthalmology;  Laterality: Right;  CDE 7.20  . CATARACT EXTRACTION W/PHACO Left 11/04/2013   Procedure: CATARACT EXTRACTION PHACO AND INTRAOCULAR LENS PLACEMENT (IOC);  Surgeon: Tonny Branch, MD;  Location: [facility];  Service: Ophthalmology;  Laterality: Left;  CDE 8.57  . COLONOSCOPY N/A 09/19/2014   Procedure: COLONOSCOPY;  Surgeon: Rogene Houston, MD;  [facility];  Service: Endoscopy;  Laterality: N/A;  7:30-8:30  . CYSTO WITH HYDRODISTENSION N/A 03/29/2016   Procedure: CYSTOSCOPY/HYDRODISTENSION;  Surgeon: Irine Seal, MD;  Location: [facility];  Service: Urology;  Laterality: N/A;  . CYSTOSCOPY WITH INJECTION N/A 03/29/2016   Procedure: CYSTOSCOPY WITH PYRIDIUM AND MARCAINE INSTALLATION;  Surgeon: Irine Seal, MD;  Location: [facility];  Service: Urology;  Laterality: N/A;  . ESOPHAGOGASTRODUODENOSCOPY N/A 10/01/2013   Procedure: ESOPHAGOGASTRODUODENOSCOPY (EGD);  Surgeon: Rogene Houston, MD;  [facility];  Service: Endoscopy;  Laterality: N/A;  120  . ESOPHAGOGASTRODUODENOSCOPY N/A 10/24/2014   Procedure: ESOPHAGOGASTRODUODENOSCOPY (EGD);  Surgeon: Rogene Houston, MD;  [facility];  Service: Endoscopy;  Laterality: N/A;  730  . GIVENS CAPSULE STUDY N/A 10/24/2014   Procedure: GIVENS CAPSULE STUDY;  Surgeon: Rogene Houston, MD;  [facility];  Service: Endoscopy;  Laterality: N/A;  . JOINT REPLACEMENT  2011   Right total knee replacement  . [place]  . KNEE ARTHROSCOPY  2009  . MALONEY DILATION N/A 10/01/2013   Procedure: Venia Minks DILATION;   Surgeon: Rogene Houston, MD;  [facility];  Service: Endoscopy;  Laterality: N/A;  . SAVORY DILATION N/A 10/01/2013   Procedure: SAVORY DILATION;  Surgeon: Rogene Houston, MD;  [facility];  Service: Endoscopy;  Laterality: N/A;  . SPINE SURGERY  1997   C5-c6 fusion  . TOTAL ABDOMINAL HYSTERECTOMY  1973   Social History   Socioeconomic History  . Marital status: Widowed    Spouse name: Not on file  . Number of children: Not on file  . Years of education: Not on file  . Highest education level: Not on file  Occupational History  . Not on file  Social Needs  . Financial resource strain: Not on file  . Food insecurity    Worry: Not on file    Inability: Not on file  . Transportation needs    Medical: Not on file    Non-medical: Not on file  Tobacco Use  . Smoking status: Former Smoker    Types: Cigarettes    Quit date: 03/11/1985    Years since quitting: 33.7  . Smokeless tobacco: Never Used  Substance and Sexual Activity  . Alcohol use: No    Alcohol/week: 0.0 standard drinks  . Drug use: No  . Sexual activity: Not on file  Lifestyle  . Physical activity    Days per week: Not on file    Minutes per session: Not on file  . Stress: Not on file  Relationships  . Social Herbalist on phone: Not on file    Gets together: Not on file    Attends religious service: Not on file    Active member of club or organization: Not on file    Attends meetings of clubs or organizations: Not on file    Relationship status: Not on file  Other Topics Concern  . Not on file  Social History Narrative   Retired. Married.    Outpatient Encounter Medications as of 11/26/2018  Medication Sig  . ACCU-CHEK AVIVA PLUS test strip TEST FOUR TIMES DAILY AS DIRECTED  . Accu-Chek Softclix Lancets lancets USE ONE DAILY AS DIRECTED - THIS IS A 100 DAY SUPPLY  . Alum Hydroxide-Mag Trisilicate (GAVISCON) [id number] MG CHEW Chew 2-3 tablets by mouth daily as needed (acid  reflux).   . Carboxymeth-Glycerin-Polysorb (REFRESH OPTIVE ADVANCED) 0.5-1-0.5 % SOLN Apply 1 drop to eye daily as needed (for dry eyes).   . cloNIDine (CATAPRES) 0.1 MG tablet Take 0.1-0.2 mg by mouth See admin instructions. TAKES 0.1 MG IN THE MORNING AND 0.2 MG AT BEDTIME  . Darbepoetin Alfa 60 MCG/ML SOLN Inject into the skin every 30 (thirty) days.  Marland Kitchen esomeprazole ([place]) 20  MG capsule Take 20 mg by mouth daily before breakfast.   . fexofenadine (ALLEGRA) 30 MG/5ML suspension Take 30 mg by mouth daily as needed (for allergies). TAKES CHILDREN'S LIQUID   . gabapentin (NEURONTIN) 100 MG capsule Take 1 capsule by mouth 3 (three) times daily as needed.  Marland Kitchen GLOBAL EASE INJECT PEN NEEDLES 32G X 4 MM MISC USE ONCE DAILY WITH SOLOSTAR INSULIN  . hydrALAZINE (APRESOLINE) 25 MG tablet Take 25 mg by mouth daily.   . Insulin Glargine (LANTUS SOLOSTAR) 100 UNIT/ML Solostar Pen INJECT 30 UNITS INTO THE SKIN EVERY DAY AT 10:00 PM  . ketotifen (ALAWAY) 0.025 % ophthalmic solution Place 1 drop into both eyes daily as needed (for allergies).   Marland Kitchen levothyroxine (SYNTHROID) 75 MCG tablet TAKE 1 TABLET BY MOUTH EVERY DAY BEFORE BREAKFAST  . losartan (COZAAR) 100 MG tablet Take 100 mg by mouth daily.    . Methylcellulose, Laxative, (CITRUCEL PO) Take 1 each by mouth every other day.  . Multiple Vitamins-Minerals (PRESERVISION AREDS 2 PO) Take 1-2 tablets by mouth See admin instructions. Take 1 capsule by mouth every other day alternating with 2 capsules by mouth every other day  . NIFEdipine (PROCARDIA-XL/ADALAT-CC/NIFEDICAL-XL) 30 MG 24 hr tablet Take 30 mg by mouth daily.  Marland Kitchen Propylene Glycol (SYSTANE BALANCE OP) Apply 1 drop to eye daily as needed (for dry eyes).   . simvastatin (ZOCOR) 10 MG tablet Take 10 mg by mouth every other day.  . torsemide (DEMADEX) 20 MG tablet Take 20 mg by mouth daily.   . traZODone (DESYREL) 100 MG tablet Take 100 mg by mouth at bedtime.   Facility-Administered Encounter  Medications as of 11/26/2018  Medication  . vancomycin (VANCOCIN) 1,000 mg in sodium chloride 0.9 % 500 mL IVPB   ALLERGIES: Allergies  Allergen Reactions  . Amoxicillin-Pot Clavulanate Hives and Other (See Comments)    Has patient had a PCN reaction causing immediate rash, facial/tongue/throat swelling, SOB or lightheadedness with hypotension: UNKNOWN Has patient had a PCN reaction causing severe rash involving mucus membranes or skin necrosis: UNKNOWN Has patient had a PCN reaction that required hospitalization No Has patient had a PCN reaction occurring within the last 10 years: No If all of the above answers are "NO", then may proceed with Cephalosporin use.   . Byetta 10 Mcg Pen [Exenatide] Other (See Comments)    Unknown  . Ceclor [Cefaclor] Other (See Comments)    Unknown  . Celexa [Citalopram Hydrobromide] Other (See Comments)    Sick  . Ciprofloxacin Nausea Only and Other (See Comments)    Dizziness  . Claritin [Loratadine] Other (See Comments)    Hair fell out  . Erythromycin Other (See Comments)    REACTION: Upset stomach  . Flexeril [Cyclobenzaprine] Other (See Comments)    Nervous  . Fosamax [Alendronate] Other (See Comments)    Made pt sick  . Jardiance [Empagliflozin] Other (See Comments)    Vaginal infection, UTI  . Motrin [Ibuprofen] Nausea Only  . Naproxen Other (See Comments)    REACTION: severe nausea  . Norvasc [Amlodipine Besylate] Other (See Comments)    Unknown  . Prednisone Other (See Comments)    REACTION: itchy rash  . Prozac [Fluoxetine Hcl] Other (See Comments)    Sick  . Sulfamethoxazole-Trimethoprim Nausea And Vomiting and Other (See Comments)    REACTION: Upset stomach   VACCINATION STATUS:  There is no immunization history on file for this patient.  Diabetes She presents for her follow-up diabetic visit. She  has type 2 diabetes mellitus. Onset time: She was diagnosed at approximate age of 52 years. Her disease course has been improving.  There are no hypoglycemic associated symptoms. Pertinent negatives for hypoglycemia include no confusion, headaches, pallor or seizures. Associated symptoms include fatigue. Pertinent negatives for diabetes include no chest pain, no polydipsia, no polyphagia and no polyuria. There are no hypoglycemic complications. Symptoms are improving. Diabetic complications include nephropathy. Risk factors for coronary artery disease include dyslipidemia, diabetes mellitus, hypertension, obesity, sedentary lifestyle and tobacco exposure (She smoked for 28 years before she quit.). Current diabetic treatment includes insulin injections. Her weight is decreasing steadily. She is following a generally unhealthy diet. When asked about meal planning, she reported none. She has not had a previous visit with a dietitian. She rarely participates in exercise. Her home blood glucose trend is decreasing steadily. Her breakfast blood glucose range is generally 110-130 mg/dl. Her overall blood glucose range is 110-130 mg/dl. An ACE inhibitor/angiotensin II receptor blocker is being taken. Eye exam is current.  Hyperlipidemia This is a chronic problem. The current episode started more than 1 year ago. The problem is controlled. Recent lipid tests were reviewed and are variable. Exacerbating diseases include diabetes, hypothyroidism and obesity. Pertinent negatives include no chest pain, myalgias or shortness of breath. Current antihyperlipidemic treatment includes statins. Risk factors for coronary artery disease include diabetes mellitus, dyslipidemia, hypertension, family history, obesity and a sedentary lifestyle.  Hypertension This is a chronic problem. The current episode started more than 1 year ago. The problem is controlled. Pertinent negatives include no chest pain, headaches, palpitations or shortness of breath. Risk factors for coronary artery disease include diabetes mellitus, dyslipidemia, obesity, sedentary lifestyle and  smoking/tobacco exposure. Past treatments include angiotensin blockers. Hypertensive end-organ damage includes kidney disease.     Objective:    There were no vitals taken for this visit.  Wt Readings from Last 3 Encounters:  05/19/18 201 lb 11.5 oz (91.5 kg)  05/11/18 195 lb 3.2 oz (88.5 kg)  11/06/17 199 lb (90.3 kg)     CMP     Component Value Date/Time   NA 137 11/04/2018 1132   K 6.1 (H) 11/04/2018 1132   CL 96 11/04/2018 1132   CO2 24 11/04/2018 1132   GLUCOSE 95 11/04/2018 1132   GLUCOSE 246 (H) 12/18/2016 1508   BUN 24 11/04/2018 1132   CREATININE 1.18 (H) 11/04/2018 1132   CALCIUM 9.6 11/04/2018 1132   PROT 6.1 11/04/2018 1132   ALBUMIN 4.5 11/04/2018 1132   AST 17 11/04/2018 1132   ALT 13 11/04/2018 1132   ALKPHOS 71 11/04/2018 1132   BILITOT 0.6 11/04/2018 1132   GFRNONAA 44 (L) 11/04/2018 1132   GFRAA 51 (L) 11/04/2018 1132   Recent Results (from the past 2160 hour(s))  Comprehensive metabolic panel     Status: Abnormal   Collection Time: 11/04/18 11:32 AM  Result Value Ref Range   Glucose 95 65 - 99 mg/dL   BUN 24 8 - 27 mg/dL   Creatinine, Ser 1.18 (H) 0.57 - 1.00 mg/dL   GFR calc non Af Amer 44 (L) >59 mL/min/1.73   GFR calc Af Amer 51 (L) >59 mL/min/1.73   BUN/Creatinine Ratio 20 12 - 28   Sodium 137 134 - 144 mmol/L   Potassium 6.1 (H) 3.5 - 5.2 mmol/L   Chloride 96 96 - 106 mmol/L   CO2 24 20 - 29 mmol/L   Calcium 9.6 8.7 - 10.3 mg/dL   Total Protein 6.1  6.0 - 8.5 g/dL   Albumin 4.5 3.7 - 4.7 g/dL   Globulin, Total 1.6 1.5 - 4.5 g/dL   Albumin/Globulin Ratio 2.8 (H) 1.2 - 2.2   Bilirubin Total 0.6 0.0 - 1.2 mg/dL   Alkaline Phosphatase 71 39 - 117 IU/L   AST 17 0 - 40 IU/L   ALT 13 0 - 32 IU/L  Hgb A1c w/o eAG     Status: None   Collection Time: 11/04/18 11:32 AM  Result Value Ref Range   Hgb A1c MFr Bld 5.5 4.8 - 5.6 %    Comment:          Prediabetes: 5.7 - 6.4          Diabetes: >6.4          Glycemic control for adults with  diabetes: <7.0   T4, free     Status: None   Collection Time: 11/04/18 11:32 AM  Result Value Ref Range   Free T4 1.42 0.82 - 1.77 ng/dL  TSH     Status: None   Collection Time: 11/04/18 11:32 AM  Result Value Ref Range   TSH 3.860 0.450 - 4.500 uIU/mL  Specimen status report     Status: None   Collection Time: 11/04/18 11:32 AM  Result Value Ref Range   specimen status report Comment     Comment: Isac Caddy CMP14 Default Ambig Abbrev CMP14 Default A hand-written panel/profile was received from your office. In accordance with the [facility] Ambiguous Test Code Policy dated July 123456, we have completed your order by using the closest currently or formerly recognized AMA panel.  We have assigned Comprehensive Metabolic Panel (14), Test Code #322000 to this request.  If this is not the testing you wished to receive on this specimen, please contact the [facility] Client Inquiry/Technical Services Department to clarify the test order.  We appreciate your business.    Lipid Panel     Component Value Date/Time   CHOL 135 05/04/2018 1136   TRIG 94 05/04/2018 1136   HDL 38 (L) 05/04/2018 1136   LDLCALC 78 05/04/2018 1136    Assessment & Plan:   1. Type 2 diabetes mellitus with stage 3 chronic kidney disease, without long-term current use of insulin ([place])  - Patient has currently uncontrolled symptomatic type 2 DM since  78 years of age. - She reports improved and stable glycemic profile ranging between 85-145 at fasting.    -Her most recent A1c was 5.5%,  however this patient had anemia with hematocrit of 29%.  This can underestimate the A1c.    - Recent labs reviewed.    Her diabetes is complicated by chronic kidney disease, improving since last visit, she remains at a high risk for more acute and chronic complications of diabetes which include CAD, CVA, CKD, retinopathy, and neuropathy. These are all discussed in detail with the patient.  - I have counseled the patient on diet  management and weight loss, by adopting a carbohydrate restricted/protein rich diet.  -  Suggestion is made for her to avoid simple carbohydrates  from her diet including Cakes, Sweet Desserts / Pastries, Ice Cream, Soda (diet and regular), Sweet Tea, Candies, Chips, Cookies, Sweet Pastries,  Store Bought Juices, Alcohol in Excess of  1-2 drinks a day, Artificial Sweeteners, Coffee Creamer, and "Sugar-free" Products. This will help patient to have stable blood glucose profile and potentially avoid unintended weight gain.   - I encouraged the patient to switch to  unprocessed or minimally  processed complex starch and increased protein intake (animal or plant source), fruits, and vegetables.  - Patient is advised to stick to a routine mealtimes to eat 3 meals  a day and avoid unnecessary snacks ( to snack only to correct hypoglycemia).   - I have approached patient with the following individualized plan to manage diabetes and patient agrees:   -Her fasting glycemic profile is on target, advised to continue Lantus at lower dose of 30 units nightly, associated with monitoring of blood glucose twice a day-daily before breakfast and at bedtime.      -She is still not a candidate for metformin therapy due to CKD due to unfavorable renal function, although it is improving.   - She is not a candidate for  SGLT2 inhibitors  at this time.  - Patient specific target  A1c;  LDL, HDL, Triglycerides, and  Waist Circumference were discussed in detail.  2) BP/HTN: she is advised to home monitor blood pressure and report if > 140/90 on 2 separate readings.  She is advised to continue current medications including losartan 100 mg p.o. daily.      3) Lipids/HPL: Her recent lipid panel showed controlled LDL 78.  She is advised to continue simvastatin 10 mg p.o. nightly.   Side effects and precautions discussed with her.  4)  Weight/Diet: CDE Consult is in progress  , exercise, and detailed carbohydrates  information provided.  5) Chronic Care/Health Maintenance:  -Patient is on ACEI/ARB and Statin medications and encouraged to continue to follow up with Ophthalmology, Podiatrist at least yearly or according to recommendations, and advised to   stay away from smoking. I have recommended yearly flu vaccine and pneumonia vaccination at least every 5 years; moderate intensity exercise for up to 150 minutes weekly; and  sleep for at least 7 hours a day.  - I advised patient to maintain close follow up with Debra Blitz, MD for primary care needs.   - Time spent with the patient: 25 min, of which >50% was spent in reviewing her blood glucose logs , discussing her hypoglycemia and hyperglycemia episodes, reviewing her current and  previous labs / studies and medications  doses and developing a plan to avoid hypoglycemia and hyperglycemia. Please refer to Patient Instructions for Blood Glucose Monitoring and Insulin/Medications Dosing Guide"  in media tab for additional information. Please  also refer to " Patient Self Inventory" in the Media  tab for reviewed elements of pertinent patient history.  Debra Hopkins participated in the discussions, expressed understanding, and voiced agreement with the above plans.  All questions were answered to her satisfaction. she is encouraged to contact clinic should she have any questions or concerns prior to her return visit.  Follow up plan: - Return in about 4 weeks (around 12/24/2018), or phone, for Follow up with Meter and Logs Only - no Labs.  Glade Lloyd, MD Phone: [phone]  Fax: [phone]   -  This note was partially dictated with voice recognition software. Similar sounding words can be transcribed inadequately or may not  be corrected upon review.  11/26/2018, 12:54 PM

## 2018-12-23 ENCOUNTER — Encounter: Payer: Self-pay | Admitting: Neurology

## 2018-12-25 ENCOUNTER — Other Ambulatory Visit: Payer: Self-pay

## 2018-12-25 ENCOUNTER — Ambulatory Visit: Payer: Medicare Other | Admitting: "Endocrinology

## 2018-12-28 ENCOUNTER — Encounter: Payer: Self-pay | Admitting: "Endocrinology

## 2018-12-28 ENCOUNTER — Other Ambulatory Visit: Payer: Self-pay

## 2018-12-28 ENCOUNTER — Ambulatory Visit (INDEPENDENT_AMBULATORY_CARE_PROVIDER_SITE_OTHER): Payer: Medicare Other | Admitting: "Endocrinology

## 2018-12-28 DIAGNOSIS — E1121 Type 2 diabetes mellitus with diabetic nephropathy: Secondary | ICD-10-CM | POA: Diagnosis not present

## 2018-12-28 DIAGNOSIS — I10 Essential (primary) hypertension: Secondary | ICD-10-CM

## 2018-12-28 DIAGNOSIS — N1831 Chronic kidney disease, stage 3a: Secondary | ICD-10-CM | POA: Diagnosis not present

## 2018-12-28 DIAGNOSIS — E782 Mixed hyperlipidemia: Secondary | ICD-10-CM

## 2018-12-28 NOTE — Progress Notes (Signed)
12/28/2018                                                    Endocrinology Telehealth Visit Follow up Note -During COVID -19 Pandemic  This visit type was conducted due to national recommendations for restrictions regarding the COVID-19 Pandemic  in an effort to limit this patient's exposure and mitigate transmission of the corona virus.  Due to her co-morbid illnesses, Debra Hopkins is at  moderate to high risk for complications without adequate follow up.  This format is felt to be most appropriate for her at this time.  I connected with this patient on 12/28/2018   by telephone and verified that I am speaking with the correct person using two identifiers. Debra Hopkins, Nov 01, 1940. she has verbally consented to this visit. All issues noted in this document were discussed and addressed. The format was not optimal for physical exam.    Subjective:    Patient ID: Debra Hopkins, female    DOB: 1940-12-07. Patient is being engaged in telehealth via telephone in follow up for management of chronic and uncontrolled type 2 diabetes, hyperlipidemia, hypertension.   PMD:   Monico Blitz, MD  Past Medical History:  Diagnosis Date  . Arthritis   . CAD (coronary artery disease)   . Cancer ([place])   . Carotid artery occlusion   . Cataracts, bilateral   . DDD (degenerative disc disease)   . Diabetes mellitus    Type 2  . Diverticulitis   . Dyspnea   . Fibromyalgia   . GERD (gastroesophageal reflux disease)   . Heart murmur   . Hemorrhoids   . History of shingles   . Impaired hearing   . Macular degeneration   . Other and unspecified hyperlipidemia   . PVD (peripheral vascular disease) ([place])   . TIA (transient ischemic attack)   . Unspecified essential hypertension   . Varicose veins    Past Surgical History:  Procedure Laterality Date  . ANGIOPLASTY     left leg 2003  . APPENDECTOMY    . aspiration of a cyst in the right breast    . BALLOON DILATION N/A 10/01/2013   Procedure: BALLOON  DILATION;  Surgeon: Rogene Houston, MD;  [facility];  Service: Endoscopy;  Laterality: N/A;  . BREAST BIOPSY    . BREAST BIOPSY Left   . CARDIAC CATHETERIZATION    . CAROTID ENDARTERECTOMY  12-03-06   left CEA  . CATARACT EXTRACTION W/PHACO Right 10/14/2013   Procedure: CATARACT EXTRACTION PHACO AND INTRAOCULAR LENS PLACEMENT (IOC);  Surgeon: Tonny Branch, MD;  Location: [facility];  Service: Ophthalmology;  Laterality: Right;  CDE 7.20  . CATARACT EXTRACTION W/PHACO Left 11/04/2013   Procedure: CATARACT EXTRACTION PHACO AND INTRAOCULAR LENS PLACEMENT (IOC);  Surgeon: Tonny Branch, MD;  Location: [facility];  Service: Ophthalmology;  Laterality: Left;  CDE 8.57  . COLONOSCOPY N/A 09/19/2014   Procedure: COLONOSCOPY;  Surgeon: Rogene Houston, MD;  [facility];  Service: Endoscopy;  Laterality: N/A;  7:30-8:30  . CYSTO WITH HYDRODISTENSION N/A 03/29/2016   Procedure: CYSTOSCOPY/HYDRODISTENSION;  Surgeon: Irine Seal, MD;  Location: [facility];  Service: Urology;  Laterality: N/A;  . CYSTOSCOPY WITH INJECTION N/A 03/29/2016   Procedure: CYSTOSCOPY WITH PYRIDIUM AND MARCAINE INSTALLATION;  Surgeon: Irine Seal, MD;  Location: [facility];  Service: Urology;  Laterality: N/A;  . ESOPHAGOGASTRODUODENOSCOPY N/A 10/01/2013   Procedure: ESOPHAGOGASTRODUODENOSCOPY (EGD);  Surgeon: Rogene Houston, MD;  [facility];  Service: Endoscopy;  Laterality: N/A;  120  . ESOPHAGOGASTRODUODENOSCOPY N/A 10/24/2014   Procedure: ESOPHAGOGASTRODUODENOSCOPY (EGD);  Surgeon: Rogene Houston, MD;  [facility];  Service: Endoscopy;  Laterality: N/A;  730  . GIVENS CAPSULE STUDY N/A 10/24/2014   Procedure: GIVENS CAPSULE STUDY;  Surgeon: Rogene Houston, MD;  [facility];  Service: Endoscopy;  Laterality: N/A;  . JOINT REPLACEMENT  2011   Right total knee replacement  . [place]  . KNEE ARTHROSCOPY  2009  . MALONEY DILATION N/A 10/01/2013   Procedure: Venia Minks DILATION;   Surgeon: Rogene Houston, MD;  [facility];  Service: Endoscopy;  Laterality: N/A;  . SAVORY DILATION N/A 10/01/2013   Procedure: SAVORY DILATION;  Surgeon: Rogene Houston, MD;  [facility];  Service: Endoscopy;  Laterality: N/A;  . SPINE SURGERY  1997   C5-c6 fusion  . TOTAL ABDOMINAL HYSTERECTOMY  1973   Social History   Socioeconomic History  . Marital status: Widowed    Spouse name: Not on file  . Number of children: Not on file  . Years of education: Not on file  . Highest education level: Not on file  Occupational History  . Not on file  Social Needs  . Financial resource strain: Not on file  . Food insecurity    Worry: Not on file    Inability: Not on file  . Transportation needs    Medical: Not on file    Non-medical: Not on file  Tobacco Use  . Smoking status: Former Smoker    Types: Cigarettes    Quit date: 03/11/1985    Years since quitting: 33.8  . Smokeless tobacco: Never Used  Substance and Sexual Activity  . Alcohol use: No    Alcohol/week: 0.0 standard drinks  . Drug use: No  . Sexual activity: Not on file  Lifestyle  . Physical activity    Days per week: Not on file    Minutes per session: Not on file  . Stress: Not on file  Relationships  . Social Herbalist on phone: Not on file    Gets together: Not on file    Attends religious service: Not on file    Active member of club or organization: Not on file    Attends meetings of clubs or organizations: Not on file    Relationship status: Not on file  Other Topics Concern  . Not on file  Social History Narrative   Retired. Married.    Outpatient Encounter Medications as of 12/28/2018  Medication Sig  . ACCU-CHEK AVIVA PLUS test strip TEST FOUR TIMES DAILY AS DIRECTED  . Accu-Chek Softclix Lancets lancets USE ONE DAILY AS DIRECTED - THIS IS A 100 DAY SUPPLY  . Alum Hydroxide-Mag Trisilicate (GAVISCON) [id number] MG CHEW Chew 2-3 tablets by mouth daily as needed (acid  reflux).   . Carboxymeth-Glycerin-Polysorb (REFRESH OPTIVE ADVANCED) 0.5-1-0.5 % SOLN Apply 1 drop to eye daily as needed (for dry eyes).   . cloNIDine (CATAPRES) 0.1 MG tablet Take 0.1-0.2 mg by mouth See admin instructions. TAKES 0.1 MG IN THE MORNING AND 0.2 MG AT BEDTIME  . Darbepoetin Alfa 60 MCG/ML SOLN Inject into the skin every 30 (thirty) days.  Marland Kitchen esomeprazole ([place]) 20  MG capsule Take 20 mg by mouth daily before breakfast.   . fexofenadine (ALLEGRA) 30 MG/5ML suspension Take 30 mg by mouth daily as needed (for allergies). TAKES CHILDREN'S LIQUID   . gabapentin (NEURONTIN) 100 MG capsule Take 1 capsule by mouth 3 (three) times daily as needed.  Marland Kitchen GLOBAL EASE INJECT PEN NEEDLES 32G X 4 MM MISC USE ONCE DAILY WITH SOLOSTAR INSULIN  . hydrALAZINE (APRESOLINE) 25 MG tablet Take 25 mg by mouth daily.   . Insulin Glargine (LANTUS SOLOSTAR) 100 UNIT/ML Solostar Pen INJECT 30 UNITS INTO THE SKIN EVERY DAY AT 10:00 PM  . ketotifen (ALAWAY) 0.025 % ophthalmic solution Place 1 drop into both eyes daily as needed (for allergies).   Marland Kitchen levothyroxine (SYNTHROID) 75 MCG tablet TAKE 1 TABLET BY MOUTH EVERY DAY BEFORE BREAKFAST  . losartan (COZAAR) 100 MG tablet Take 100 mg by mouth daily.    . Methylcellulose, Laxative, (CITRUCEL PO) Take 1 each by mouth every other day.  . Multiple Vitamins-Minerals (PRESERVISION AREDS 2 PO) Take 1-2 tablets by mouth See admin instructions. Take 1 capsule by mouth every other day alternating with 2 capsules by mouth every other day  . NIFEdipine (PROCARDIA-XL/ADALAT-CC/NIFEDICAL-XL) 30 MG 24 hr tablet Take 30 mg by mouth daily.  Marland Kitchen Propylene Glycol (SYSTANE BALANCE OP) Apply 1 drop to eye daily as needed (for dry eyes).   . simvastatin (ZOCOR) 10 MG tablet Take 10 mg by mouth every other day.  . torsemide (DEMADEX) 20 MG tablet Take 20 mg by mouth daily.   . traZODone (DESYREL) 100 MG tablet Take 100 mg by mouth at bedtime.   Facility-Administered Encounter  Medications as of 12/28/2018  Medication  . vancomycin (VANCOCIN) 1,000 mg in sodium chloride 0.9 % 500 mL IVPB   ALLERGIES: Allergies  Allergen Reactions  . Amoxicillin-Pot Clavulanate Hives and Other (See Comments)    Has patient had a PCN reaction causing immediate rash, facial/tongue/throat swelling, SOB or lightheadedness with hypotension: UNKNOWN Has patient had a PCN reaction causing severe rash involving mucus membranes or skin necrosis: UNKNOWN Has patient had a PCN reaction that required hospitalization No Has patient had a PCN reaction occurring within the last 10 years: No If all of the above answers are "NO", then may proceed with Cephalosporin use.   . Byetta 10 Mcg Pen [Exenatide] Other (See Comments)    Unknown  . Ceclor [Cefaclor] Other (See Comments)    Unknown  . Celexa [Citalopram Hydrobromide] Other (See Comments)    Sick  . Ciprofloxacin Nausea Only and Other (See Comments)    Dizziness  . Claritin [Loratadine] Other (See Comments)    Hair fell out  . Erythromycin Other (See Comments)    REACTION: Upset stomach  . Flexeril [Cyclobenzaprine] Other (See Comments)    Nervous  . Fosamax [Alendronate] Other (See Comments)    Made pt sick  . Jardiance [Empagliflozin] Other (See Comments)    Vaginal infection, UTI  . Motrin [Ibuprofen] Nausea Only  . Naproxen Other (See Comments)    REACTION: severe nausea  . Norvasc [Amlodipine Besylate] Other (See Comments)    Unknown  . Prednisone Other (See Comments)    REACTION: itchy rash  . Prozac [Fluoxetine Hcl] Other (See Comments)    Sick  . Sulfamethoxazole-Trimethoprim Nausea And Vomiting and Other (See Comments)    REACTION: Upset stomach   VACCINATION STATUS:  There is no immunization history on file for this patient.  Diabetes She presents for her follow-up diabetic visit. She  has type 2 diabetes mellitus. Onset time: She was diagnosed at approximate age of 68 years. Her disease course has been  improving. There are no hypoglycemic associated symptoms. Pertinent negatives for hypoglycemia include no confusion, headaches, pallor or seizures. Associated symptoms include fatigue. Pertinent negatives for diabetes include no chest pain, no polydipsia, no polyphagia and no polyuria. There are no hypoglycemic complications. Symptoms are improving. Diabetic complications include nephropathy. Risk factors for coronary artery disease include dyslipidemia, diabetes mellitus, hypertension, obesity, sedentary lifestyle and tobacco exposure (She smoked for 28 years before she quit.). Current diabetic treatment includes insulin injections. Her weight is decreasing steadily. She is following a generally unhealthy diet. When asked about meal planning, she reported none. She has not had a previous visit with a dietitian. She rarely participates in exercise. Her home blood glucose trend is decreasing steadily. Her breakfast blood glucose range is generally 110-130 mg/dl. Her bedtime blood glucose range is generally >200 mg/dl. Her overall blood glucose range is 180-200 mg/dl. An ACE inhibitor/angiotensin II receptor blocker is being taken. Eye exam is current.  Hyperlipidemia This is a chronic problem. The current episode started more than 1 year ago. The problem is controlled. Recent lipid tests were reviewed and are variable. Exacerbating diseases include diabetes, hypothyroidism and obesity. Pertinent negatives include no chest pain, myalgias or shortness of breath. Current antihyperlipidemic treatment includes statins. Risk factors for coronary artery disease include diabetes mellitus, dyslipidemia, hypertension, family history, obesity and a sedentary lifestyle.  Hypertension This is a chronic problem. The current episode started more than 1 year ago. The problem is controlled. Pertinent negatives include no chest pain, headaches, palpitations or shortness of breath. Risk factors for coronary artery disease include  diabetes mellitus, dyslipidemia, obesity, sedentary lifestyle and smoking/tobacco exposure. Past treatments include angiotensin blockers. Hypertensive end-organ damage includes kidney disease.     Objective:    There were no vitals taken for this visit.  Wt Readings from Last 3 Encounters:  05/19/18 201 lb 11.5 oz (91.5 kg)  05/11/18 195 lb 3.2 oz (88.5 kg)  11/06/17 199 lb (90.3 kg)     CMP     Component Value Date/Time   NA 137 11/04/2018 1132   K 6.1 (H) 11/04/2018 1132   CL 96 11/04/2018 1132   CO2 24 11/04/2018 1132   GLUCOSE 95 11/04/2018 1132   GLUCOSE 246 (H) 12/18/2016 1508   BUN 24 11/04/2018 1132   CREATININE 1.18 (H) 11/04/2018 1132   CALCIUM 9.6 11/04/2018 1132   PROT 6.1 11/04/2018 1132   ALBUMIN 4.5 11/04/2018 1132   AST 17 11/04/2018 1132   ALT 13 11/04/2018 1132   ALKPHOS 71 11/04/2018 1132   BILITOT 0.6 11/04/2018 1132   GFRNONAA 44 (L) 11/04/2018 1132   GFRAA 51 (L) 11/04/2018 1132   Recent Results (from the past 2160 hour(s))  Comprehensive metabolic panel     Status: Abnormal   Collection Time: 11/04/18 11:32 AM  Result Value Ref Range   Glucose 95 65 - 99 mg/dL   BUN 24 8 - 27 mg/dL   Creatinine, Ser 1.18 (H) 0.57 - 1.00 mg/dL   GFR calc non Af Amer 44 (L) >59 mL/min/1.73   GFR calc Af Amer 51 (L) >59 mL/min/1.73   BUN/Creatinine Ratio 20 12 - 28   Sodium 137 134 - 144 mmol/L   Potassium 6.1 (H) 3.5 - 5.2 mmol/L   Chloride 96 96 - 106 mmol/L   CO2 24 20 - 29 mmol/L   Calcium 9.6  8.7 - 10.3 mg/dL   Total Protein 6.1 6.0 - 8.5 g/dL   Albumin 4.5 3.7 - 4.7 g/dL   Globulin, Total 1.6 1.5 - 4.5 g/dL   Albumin/Globulin Ratio 2.8 (H) 1.2 - 2.2   Bilirubin Total 0.6 0.0 - 1.2 mg/dL   Alkaline Phosphatase 71 39 - 117 IU/L   AST 17 0 - 40 IU/L   ALT 13 0 - 32 IU/L  Hgb A1c w/o eAG     Status: None   Collection Time: 11/04/18 11:32 AM  Result Value Ref Range   Hgb A1c MFr Bld 5.5 4.8 - 5.6 %    Comment:          Prediabetes: 5.7 - 6.4           Diabetes: >6.4          Glycemic control for adults with diabetes: <7.0   T4, free     Status: None   Collection Time: 11/04/18 11:32 AM  Result Value Ref Range   Free T4 1.42 0.82 - 1.77 ng/dL  TSH     Status: None   Collection Time: 11/04/18 11:32 AM  Result Value Ref Range   TSH 3.860 0.450 - 4.500 uIU/mL  Specimen status report     Status: None   Collection Time: 11/04/18 11:32 AM  Result Value Ref Range   specimen status report Comment     Comment: Isac Caddy CMP14 Default Ambig Abbrev CMP14 Default A hand-written panel/profile was received from your office. In accordance with the [facility] Ambiguous Test Code Policy dated July 123456, we have completed your order by using the closest currently or formerly recognized AMA panel.  We have assigned Comprehensive Metabolic Panel (14), Test Code #322000 to this request.  If this is not the testing you wished to receive on this specimen, please contact the [facility] Client Inquiry/Technical Services Department to clarify the test order.  We appreciate your business.    Lipid Panel     Component Value Date/Time   CHOL 135 05/04/2018 1136   TRIG 94 05/04/2018 1136   HDL 38 (L) 05/04/2018 1136   LDLCALC 78 05/04/2018 1136    Assessment & Plan:   1. Type 2 diabetes mellitus with stage 3 chronic kidney disease, without long-term current use of insulin ([place])  - Patient has currently uncontrolled symptomatic type 2 DM since  78 years of age. - She reports controlled fasting glycemic profile, slightly above target bedtime blood glucose readings.  Her average blood glucose is 180 mg.,  Recent A1c of 5.5%.  Fasting blood glucose ranged between 89-189, bedtime blood glucose readings range from 224-308. -She does not report significant hypoglycemia.   - Recent labs reviewed.    Her diabetes is complicated by chronic kidney disease, improving since last visit, she remains at a high risk for more acute and chronic complications of diabetes  which include CAD, CVA, CKD, retinopathy, and neuropathy. These are all discussed in detail with the patient.  - I have counseled the patient on diet management and weight loss, by adopting a carbohydrate restricted/protein rich diet.  -  Suggestion is made for her to avoid simple carbohydrates  from her diet including Cakes, Sweet Desserts / Pastries, Ice Cream, Soda (diet and regular), Sweet Tea, Candies, Chips, Cookies, Sweet Pastries,  Store Bought Juices, Alcohol in Excess of  1-2 drinks a day, Artificial Sweeteners, Coffee Creamer, and "Sugar-free" Products. This will help patient to have stable blood glucose profile and potentially avoid  unintended weight gain.   - I encouraged the patient to switch to  unprocessed or minimally processed complex starch and increased protein intake (animal or plant source), fruits, and vegetables.  - Patient is advised to stick to a routine mealtimes to eat 3 meals  a day and avoid unnecessary snacks ( to snack only to correct hypoglycemia).   - I have approached patient with the following individualized plan to manage diabetes and patient agrees:   -Her fasting glycemic profile is on target, advised to continue Lantus at 30 units nightly,  associated with monitoring of blood glucose twice a day-daily before breakfast and at bedtime.      -She is still not a candidate for metformin therapy due to CKD due to unfavorable renal function, although it is improving.   - She is not a candidate for  SGLT2 inhibitors  at this time.  - Patient specific target  A1c;  LDL, HDL, Triglycerides, and  Waist Circumference were discussed in detail.  2) BP/HTN: she is advised to home monitor blood pressure and report if > 140/90 on 2 separate readings. She is advised to continue current medications including losartan 100 mg p.o. daily.      3) Lipids/HPL: Her recent lipid panel showed controlled LDL 78.  She is advised to continue simvastatin 10 mg p.o. nightly.    Side  effects and precautions discussed with her.  4)  Weight/Diet: CDE Consult is in progress  , exercise, and detailed carbohydrates information provided.  5) Chronic Care/Health Maintenance:  -Patient is on ACEI/ARB and Statin medications and encouraged to continue to follow up with Ophthalmology, Podiatrist at least yearly or according to recommendations, and advised to   stay away from smoking. I have recommended yearly flu vaccine and pneumonia vaccination at least every 5 years; moderate intensity exercise for up to 150 minutes weekly; and  sleep for at least 7 hours a day.  - I advised patient to maintain close follow up with Monico Blitz, MD for primary care needs.  - Patient Care Time Today:  25 min, of which >50% was spent in  counseling and the rest reviewing her  current and  previous labs/studies, previous treatments, her blood glucose readings, and medications' doses and developing a plan for long-term care based on the latest recommendations for standards of care.   Debra Hopkins participated in the discussions, expressed understanding, and voiced agreement with the above plans.  All questions were answered to her satisfaction. she is encouraged to contact clinic should she have any questions or concerns prior to her return visit.   Follow up plan: - Return in about 3 months (around 03/30/2019) for Bring Meter and Logs- A1c in Office.  Glade Lloyd, MD Phone: [phone]  Fax: [phone]   -  This note was partially dictated with voice recognition software. Similar sounding words can be transcribed inadequately or may not  be corrected upon review.  12/28/2018, 5:41 PM

## 2019-01-29 ENCOUNTER — Other Ambulatory Visit: Payer: Self-pay

## 2019-01-29 ENCOUNTER — Encounter: Payer: Self-pay | Admitting: Neurology

## 2019-01-29 ENCOUNTER — Telehealth: Payer: Self-pay

## 2019-01-29 ENCOUNTER — Ambulatory Visit (INDEPENDENT_AMBULATORY_CARE_PROVIDER_SITE_OTHER): Payer: Medicare Other | Admitting: Neurology

## 2019-01-29 VITALS — BP 154/88 | HR 102 | Ht 63.0 in | Wt 165.0 lb

## 2019-01-29 DIAGNOSIS — I739 Peripheral vascular disease, unspecified: Secondary | ICD-10-CM

## 2019-01-29 DIAGNOSIS — I998 Other disorder of circulatory system: Secondary | ICD-10-CM | POA: Diagnosis not present

## 2019-01-29 NOTE — Patient Instructions (Addendum)
Please follow-up with [facility] to evaluate blood flow into the right foot  Appt will be scheuled, referral form has to be filled out.[phone], Debra Hopkins

## 2019-01-29 NOTE — Telephone Encounter (Signed)
Referral sent to [facility] for patient to follow up. Number given to patient as well to call. Phone number [phone]. Fax [phone]

## 2019-01-29 NOTE — Progress Notes (Signed)
[facility] Clinic Note - Initial Visit   Date: 01/29/19  [name] MRN: [id number] DOB: 1940/09/27   Dear Dr. Manuella Ghazi:  Thank you for your kind referral of Debra Hopkins for consultation of right foot pain. Although her history is well known to you, please allow [place] to reiterate it for the purpose of our medical record. The patient was accompanied to the clinic by self.   History of Present Illness: Debra Hopkins is a 78 y.o. right-handed female with formal presenting for evaluation of hypertension hypothyroidism, hyperlipidemia, type 2 diabetes, myelofibrosis, peripheral vascular disease, CAD, fibromyalgia, and depression referred for evaluation of right foot pain.   She had cellulitis of both legs in summer of 2020 which was treated with antibiotics.  After her cellulitis improved, she developed pain over the right great toe extending into all the toes.  Pain is sharp, stabbing, and throbbing.  It is worse when she walks or with weight-bearing and alleviated with rest, but even at rest, she has severe pain.  It extends into her right lower leg when severe.  She is followed by [facility] for peripheral vascular disease.  Her last arterial studies of the legs in March 2020 showed right great toe pressure of 12mmHg and 54mmHg on the left.  She denies similar pain in the left foot.  She has mild tingling and numbness of both feet and has known diabetic neuropathy.    Out-side paper records, electronic medical record, and images have been reviewed where available and summarized as:  ABI 05/18/2017: Right: Resting right ankle-brachial index indicates mild right lower extremity arterial disease. The right toe-brachial index is abnormal. ABIs are unreliable. RT great toe pressure = 22 mmHg.  Left: Resting left ankle-brachial index indicates mild left lower extremity arterial disease. The left toe-brachial index is abnormal. TBIs are unreliable. LT Great toe pressure =  62 mmHg.  Lab Results  Component Value Date   HGBA1C 5.5 11/04/2018   Lab Results  Component Value Date   [id number] (H) 08/22/2014   Lab Results  Component Value Date   TSH 3.860 11/04/2018   No results found for: ESRSEDRATE, POCTSEDRATE  Past Medical History:  Diagnosis Date  . Arthritis   . CAD (coronary artery disease)   . Cancer ([place])   . Carotid artery occlusion   . Cataracts, bilateral   . DDD (degenerative disc disease)   . Diabetes mellitus    Type 2  . Diverticulitis   . Dyspnea   . Fibromyalgia   . GERD (gastroesophageal reflux disease)   . Heart murmur   . Hemorrhoids   . History of shingles   . Impaired hearing   . Macular degeneration   . Other and unspecified hyperlipidemia   . PVD (peripheral vascular disease) ([place])   . TIA (transient ischemic attack)   . Unspecified essential hypertension   . Varicose veins     Past Surgical History:  Procedure Laterality Date  . ANGIOPLASTY     left leg 2003  . APPENDECTOMY    . aspiration of a cyst in the right breast    . BALLOON DILATION N/A 10/01/2013   Procedure: BALLOON DILATION;  Surgeon: Rogene Houston, MD;  [facility];  Service: Endoscopy;  Laterality: N/A;  . BREAST BIOPSY    . BREAST BIOPSY Left   . CARDIAC CATHETERIZATION    . CAROTID ENDARTERECTOMY  12-03-06   left CEA  . CATARACT EXTRACTION W/PHACO  Right 10/14/2013   Procedure: CATARACT EXTRACTION PHACO AND INTRAOCULAR LENS PLACEMENT (IOC);  Surgeon: Tonny Branch, MD;  Location: [facility];  Service: Ophthalmology;  Laterality: Right;  CDE 7.20  . CATARACT EXTRACTION W/PHACO Left 11/04/2013   Procedure: CATARACT EXTRACTION PHACO AND INTRAOCULAR LENS PLACEMENT (IOC);  Surgeon: Tonny Branch, MD;  Location: [facility];  Service: Ophthalmology;  Laterality: Left;  CDE 8.57  . COLONOSCOPY N/A 09/19/2014   Procedure: COLONOSCOPY;  Surgeon: Rogene Houston, MD;  [facility];  Service: Endoscopy;  Laterality: N/A;  7:30-8:30  . CYSTO  WITH HYDRODISTENSION N/A 03/29/2016   Procedure: CYSTOSCOPY/HYDRODISTENSION;  Surgeon: Irine Seal, MD;  Location: [facility];  Service: Urology;  Laterality: N/A;  . CYSTOSCOPY WITH INJECTION N/A 03/29/2016   Procedure: CYSTOSCOPY WITH PYRIDIUM AND MARCAINE INSTALLATION;  Surgeon: Irine Seal, MD;  Location: [facility];  Service: Urology;  Laterality: N/A;  . ESOPHAGOGASTRODUODENOSCOPY N/A 10/01/2013   Procedure: ESOPHAGOGASTRODUODENOSCOPY (EGD);  Surgeon: Rogene Houston, MD;  [facility];  Service: Endoscopy;  Laterality: N/A;  120  . ESOPHAGOGASTRODUODENOSCOPY N/A 10/24/2014   Procedure: ESOPHAGOGASTRODUODENOSCOPY (EGD);  Surgeon: Rogene Houston, MD;  [facility];  Service: Endoscopy;  Laterality: N/A;  730  . GIVENS CAPSULE STUDY N/A 10/24/2014   Procedure: GIVENS CAPSULE STUDY;  Surgeon: Rogene Houston, MD;  [facility];  Service: Endoscopy;  Laterality: N/A;  . JOINT REPLACEMENT  2011   Right total knee replacement  . [place]  . KNEE ARTHROSCOPY  2009  . MALONEY DILATION N/A 10/01/2013   Procedure: Venia Minks DILATION;  Surgeon: Rogene Houston, MD;  [facility];  Service: Endoscopy;  Laterality: N/A;  . SAVORY DILATION N/A 10/01/2013   Procedure: SAVORY DILATION;  Surgeon: Rogene Houston, MD;  [facility];  Service: Endoscopy;  Laterality: N/A;  . SPINE SURGERY  1997   C5-c6 fusion  . TOTAL ABDOMINAL HYSTERECTOMY  1973     Medications:  Outpatient Encounter Medications as of 01/29/2019  Medication Sig  . ACCU-CHEK AVIVA PLUS test strip TEST FOUR TIMES DAILY AS DIRECTED  . Accu-Chek Softclix Lancets lancets USE ONE DAILY AS DIRECTED - THIS IS A 100 DAY SUPPLY  . allopurinol (ZYLOPRIM) 100 MG tablet Take by mouth.  . Alum Hydroxide-Mag Trisilicate (GAVISCON) [id number] MG CHEW Chew 2-3 tablets by mouth daily as needed (acid reflux).   . butalbital-acetaminophen-caffeine (FIORICET) 50-325-40 MG tablet Take 1 tablet by  mouth 5 (five) times daily as needed.  . Carboxymeth-Glycerin-Polysorb (REFRESH OPTIVE ADVANCED) 0.5-1-0.5 % SOLN Apply 1 drop to eye daily as needed (for dry eyes).   . cloNIDine (CATAPRES) 0.1 MG tablet Take 0.1-0.2 mg by mouth See admin instructions. TAKES 0.1 MG IN THE MORNING AND 0.2 MG AT BEDTIME  . Darbepoetin Alfa 60 MCG/ML SOLN Inject into the skin every 30 (thirty) days.  Marland Kitchen esomeprazole (NEXIUM) 20 MG capsule Take 20 mg by mouth daily before breakfast.   . fexofenadine (ALLEGRA) 30 MG/5ML suspension Take 30 mg by mouth daily as needed (for allergies). TAKES CHILDREN'S LIQUID   . GLOBAL EASE INJECT PEN NEEDLES 32G X 4 MM MISC USE ONCE DAILY WITH SOLOSTAR INSULIN  . hydrALAZINE (APRESOLINE) 25 MG tablet Take 25 mg by mouth daily.   . Insulin Glargine (LANTUS SOLOSTAR) 100 UNIT/ML Solostar Pen INJECT 30 UNITS INTO THE SKIN EVERY DAY AT 10:00 PM  . ketotifen (ALAWAY) 0.025 % ophthalmic solution Place 1 drop into both eyes  daily as needed (for allergies).   Marland Kitchen levothyroxine (SYNTHROID) 75 MCG tablet TAKE 1 TABLET BY MOUTH EVERY DAY BEFORE BREAKFAST  . losartan (COZAAR) 100 MG tablet Take 100 mg by mouth daily.    . Methylcellulose, Laxative, (CITRUCEL PO) Take 1 each by mouth every other day.  . Multiple Vitamins-Minerals (PRESERVISION AREDS 2 PO) Take 1-2 tablets by mouth See admin instructions. Take 1 capsule by mouth every other day alternating with 2 capsules by mouth every other day  . NIFEdipine (ADALAT CC) 30 MG 24 hr tablet Take 30 mg by mouth daily.  . predniSONE (DELTASONE) 5 MG tablet Take by mouth.  . pregabalin (LYRICA) 25 MG capsule Take 25 mg by mouth 3 (three) times daily.  Marland Kitchen Propylene Glycol (SYSTANE BALANCE OP) Apply 1 drop to eye daily as needed (for dry eyes).   . simvastatin (ZOCOR) 10 MG tablet Take 10 mg by mouth every other day.  . torsemide (DEMADEX) 20 MG tablet Take 20 mg by mouth daily.   . traZODone (DESYREL) 100 MG tablet Take 100 mg by mouth at bedtime.  .  triamcinolone cream (KENALOG) 0.1 %   . allopurinol (ZYLOPRIM) 100 MG tablet TAKE 1 TABLET BY MOUTH EVERY DAY -- pt has new DOSE ON file TWICE DAILY now fill new rx ON hold kd 11/12  . cephALEXin (KEFLEX) 250 MG capsule Take 250 mg by mouth 3 (three) times daily.  Marland Kitchen gabapentin (NEURONTIN) 100 MG capsule Take 1 capsule by mouth 3 (three) times daily as needed.  Marland Kitchen NIFEdipine (PROCARDIA-XL/ADALAT-CC/NIFEDICAL-XL) 30 MG 24 hr tablet Take 30 mg by mouth daily.  Marland Kitchen oxyCODONE-acetaminophen (PERCOCET/ROXICET) 5-325 MG tablet Take 1 tablet by mouth every 6 (six) hours as needed.  . predniSONE (STERAPRED UNI-PAK 21 TAB) 5 MG (21) TBPK tablet take as directed PER package   Facility-Administered Encounter Medications as of 01/29/2019  Medication  . vancomycin (VANCOCIN) 1,000 mg in sodium chloride 0.9 % 500 mL IVPB    Allergies:  Allergies  Allergen Reactions  . Amoxicillin-Pot Clavulanate Hives and Other (See Comments)    Has patient had a PCN reaction causing immediate rash, facial/tongue/throat swelling, SOB or lightheadedness with hypotension: UNKNOWN Has patient had a PCN reaction causing severe rash involving mucus membranes or skin necrosis: UNKNOWN Has patient had a PCN reaction that required hospitalization No Has patient had a PCN reaction occurring within the last 10 years: No If all of the above answers are "NO", then may proceed with Cephalosporin use.   . Byetta 10 Mcg Pen [Exenatide] Other (See Comments)    Unknown  . Ceclor [Cefaclor] Other (See Comments)    Unknown  . Celexa [Citalopram Hydrobromide] Other (See Comments)    Sick  . Ciprofloxacin Nausea Only and Other (See Comments)    Dizziness  . Claritin [Loratadine] Other (See Comments)    Hair fell out  . Erythromycin Other (See Comments)    REACTION: Upset stomach  . Flexeril [Cyclobenzaprine] Other (See Comments)    Nervous  . Fosamax [Alendronate] Other (See Comments)    Made pt sick  . Jardiance [Empagliflozin]  Other (See Comments)    Vaginal infection, UTI  . Motrin [Ibuprofen] Nausea Only  . Naproxen Other (See Comments)    REACTION: severe nausea  . Norvasc [Amlodipine Besylate] Other (See Comments)    Unknown  . Prednisone Other (See Comments)    REACTION: itchy rash  . Prozac [Fluoxetine Hcl] Other (See Comments)    Sick  . Sulfamethoxazole-Trimethoprim Nausea And  Vomiting and Other (See Comments)    REACTION: Upset stomach    Family History: Family History  Problem Relation Age of Onset  . Stroke Mother   . Hypertension Mother   . Stroke Father   . Hypertension Father   . Diabetes Father   . Coronary artery disease Father   . Heart disease Father   . Diabetes Sister   . Hypertension Sister   . Lung cancer Brother   . Sinusitis Brother   . Healthy Brother   . Diabetes Son   . Hypertension Son   . Coronary artery disease Other     Social History: Social History   Tobacco Use  . Smoking status: Former Smoker    Types: Cigarettes    Quit date: 03/11/1985    Years since quitting: 33.9  . Smokeless tobacco: Never Used  Substance Use Topics  . Alcohol use: No    Alcohol/week: 0.0 standard drinks  . Drug use: No   Social History   Social History Narrative   Retired. Married. [place]   Right handed    Review of Systems:  CONSTITUTIONAL: No fevers, chills, night sweats, or weight loss.   EYES: No visual changes or eye pain ENT: No hearing changes.  No history of nose bleeds.   RESPIRATORY: No cough, wheezing and shortness of breath.   CARDIOVASCULAR: Negative for chest pain, and palpitations.   GI: Negative for abdominal discomfort, blood in stools or black stools.  No recent change in bowel habits.   GU:  No history of incontinence.   MUSCLOSKELETAL: No history of joint pain or swelling.  No myalgias.  +right foot pain SKIN: Negative for lesions, rash, and itching.   HEMATOLOGY/ONCOLOGY: Negative for prolonged bleeding, bruising easily, and swollen nodes.  No  history of cancer.   ENDOCRINE: Negative for cold or heat intolerance, polydipsia or goiter.   PSYCH:  No depression or anxiety symptoms.   NEURO: As Above.   Vital Signs:  BP (!) 154/88   Pulse (!) 102   Ht 5\' 3"  (1.6 m)   Wt 165 lb (74.8 kg)   SpO2 98%   BMI 29.23 kg/m    General Medical Exam:   General:  Tearful, mild distress due to pain.   Eyes/ENT: see cranial nerve examination.   Neck:   No carotid bruits. Respiratory:  Clear to auscultation, good air entry bilaterally.   Cardiac:  Regular rate and rhythm, no murmur.   Extremities:  Right great toe with ulceration at the base.   Right foot and lower leg is cool as compared to the left, and toes are erythematous with palor over the dorsum of the foot.  Unable to palpate dorsalis pedis pulse on the right foot. Skin:  No rashes or lesions.  Neurological Exam: MENTAL STATUS including orientation to time, place, person, recent and remote memory, attention span and concentration, language, and fund of knowledge is normal.  Speech is not dysarthric.  CRANIAL NERVES: II:  No visual field defects.  III-IV-VI: Pupils equal round and reactive to light.  Normal conjugate, extra-ocular eye movements in all directions of gaze.  No nystagmus.  No ptosis.   V:  Normal facial sensation.    VII:  Normal facial symmetry and movements.   VIII:  Normal hearing and vestibular function.    MOTOR:  Atrophy of the intrinsic foot muscles on the right >> left.  No abnormal movements.  No pronator drift.  RUE 5/5    LUE  5/5 RLE 5/5 proximally and with limited ROM and weakness with dosiflexion 4/5 and toe extension/flexion 3/4 LLE 5/5 proximally and distally  MSRs:  Right        Left                  brachioradialis 2+  2+  biceps 2+  2+  triceps 2+  2+  patellar 2+  2+  ankle jerk 0  1+  Hoffman no  no  plantar response down  down   SENSORY:  Vibration is reduced at the great toe, worse on the right foot as compared to the left.  Loss of  temperature and pin prick over the distal right foot.  COORDINATION/GAIT: Normal finger-to- nose-finger. Gait appears antalgic, assisted with walker.   IMPRESSION: Right foot/toe pain ongoing since summer of 2020.  She has history of PVD and severely reduced pressure in the right toe as seen on vascular study from March 2020.  Her exam shows R great toe ulcer and cold foot which together with her rest pain is most concerning for limb ischemia.  Patient was informed to follow[facility]up with [facility].  I have personally called their office and discussed this case with the physician on-call who will assist in getting the patient evaluated next week.   She does have underlying neuropathy contributed by diabetes and PAD, but this does not explain the severity of her asymmetric foot pain.    Thank you for allowing me to participate in patient's care.  If I can answer any additional questions, I would be pleased to do so.    Sincerely,    Lurlean Kernen K. Posey Pronto, DO

## 2019-02-10 ENCOUNTER — Other Ambulatory Visit: Payer: Self-pay

## 2019-02-10 DIAGNOSIS — I779 Disorder of arteries and arterioles, unspecified: Secondary | ICD-10-CM

## 2019-02-11 ENCOUNTER — Other Ambulatory Visit (HOSPITAL_COMMUNITY)
Admission: RE | Admit: 2019-02-11 | Discharge: 2019-02-11 | Disposition: A | Discharge status: Home / Self Care | Payer: Medicare Other | Source: Ambulatory Visit | Attending: Vascular Surgery | Admitting: Vascular Surgery

## 2019-02-11 ENCOUNTER — Other Ambulatory Visit: Payer: Self-pay | Admitting: *Deleted

## 2019-02-11 ENCOUNTER — Other Ambulatory Visit: Payer: Self-pay

## 2019-02-11 ENCOUNTER — Ambulatory Visit (INDEPENDENT_AMBULATORY_CARE_PROVIDER_SITE_OTHER): Payer: Medicare Other | Admitting: Family

## 2019-02-11 ENCOUNTER — Ambulatory Visit (HOSPITAL_COMMUNITY)
Admission: RE | Admit: 2019-02-11 | Discharge: 2019-02-11 | Disposition: A | Discharge status: Home / Self Care | Payer: Medicare Other | Source: Ambulatory Visit | Attending: Family | Admitting: Family

## 2019-02-11 ENCOUNTER — Encounter: Payer: Self-pay | Admitting: Family

## 2019-02-11 ENCOUNTER — Encounter: Payer: Self-pay | Admitting: *Deleted

## 2019-02-11 VITALS — BP 160/67 | HR 77 | Temp 97.9°F | Resp 16 | Ht 63.0 in | Wt 165.0 lb

## 2019-02-11 DIAGNOSIS — I779 Disorder of arteries and arterioles, unspecified: Secondary | ICD-10-CM | POA: Insufficient documentation

## 2019-02-11 DIAGNOSIS — Z959 Presence of cardiac and vascular implant and graft, unspecified: Secondary | ICD-10-CM

## 2019-02-11 DIAGNOSIS — Z9889 Other specified postprocedural states: Secondary | ICD-10-CM

## 2019-02-11 DIAGNOSIS — M79671 Pain in right foot: Secondary | ICD-10-CM

## 2019-02-11 DIAGNOSIS — Z87891 Personal history of nicotine dependence: Secondary | ICD-10-CM

## 2019-02-11 DIAGNOSIS — L97512 Non-pressure chronic ulcer of other part of right foot with fat layer exposed: Secondary | ICD-10-CM | POA: Diagnosis not present

## 2019-02-11 DIAGNOSIS — I998 Other disorder of circulatory system: Secondary | ICD-10-CM

## 2019-02-11 DIAGNOSIS — I6523 Occlusion and stenosis of bilateral carotid arteries: Secondary | ICD-10-CM

## 2019-02-11 LAB — SARS CORONAVIRUS 2 (TAT 6-24 HRS): SARS Coronavirus 2: NEGATIVE

## 2019-02-11 NOTE — Patient Instructions (Signed)
Peripheral Vascular Disease  Peripheral vascular disease (PVD) is a disease of the blood vessels that are not part of your heart and brain. A simple term for PVD is poor circulation. In most cases, PVD narrows the blood vessels that carry blood from your heart to the rest of your body. This can reduce the supply of blood to your arms, legs, and internal organs, like your stomach or kidneys. However, PVD most often affects a person's lower legs and feet. Without treatment, PVD tends to get worse. PVD can also lead to acute ischemic limb. This is when an arm or leg suddenly cannot get enough blood. This is a medical emergency. Follow these instructions at home: Lifestyle  Do not use any products that contain nicotine or tobacco, such as cigarettes and e-cigarettes. If you need help quitting, ask your doctor.  Lose weight if you are overweight. Or, stay at a healthy weight as told by your doctor.  Eat a diet that is low in fat and cholesterol. If you need help, ask your doctor.  Exercise regularly. Ask your doctor for activities that are right for you. General instructions  Take over-the-counter and prescription medicines only as told by your doctor.  Take good care of your feet: ? Wear comfortable shoes that fit well. ? Check your feet often for any cuts or sores.  Keep all follow-up visits as told by your doctor This is important. Contact a doctor if:  You have cramps in your legs when you walk.  You have leg pain when you are at rest.  You have coldness in a leg or foot.  Your skin changes.  You are unable to get or have an erection (erectile dysfunction).  You have cuts or sores on your feet that do not heal. Get help right away if:  Your arm or leg turns cold, numb, and blue.  Your arms or legs become red, warm, swollen, painful, or numb.  You have chest pain.  You have trouble breathing.  You suddenly have weakness in your face, arm, or leg.  You become very  confused or you cannot speak.  You suddenly have a very bad headache.  You suddenly cannot see. Summary  Peripheral vascular disease (PVD) is a disease of the blood vessels.  A simple term for PVD is poor circulation. Without treatment, PVD tends to get worse.  Treatment may include exercise, low fat and low cholesterol diet, and quitting smoking. This information is not intended to replace advice given to you by your health care provider. Make sure you discuss any questions you have with your health care provider. Document Released: 05/22/2009 Document Revised: 02/07/2017 Document Reviewed: 04/04/2016 Elsevier Patient Education  2020 [employer]  

## 2019-02-11 NOTE — Progress Notes (Signed)
[facility] [place]   CC: Right toe ulcer, severe ischemic pain right foot  History of Present Illness Debra Hopkins is a 78 y.o. female who is status post left CEA in September 2008by Dr. Kellie Simmering, left SFA PTA with stent 2003. She returns today after stubbing her toe last night, states she has had a black toe for unknown amount of time prior to this. She reports having pain in right foot since July 2020, came on gradually. Redness in all right toes started about a week ago. She is taking Percocet 5/325 for pain, prescribed by her PCP for her arthritis previously.    Was having cramping in left calf after walking about 1/2 block,but she is not walking enough to elicit claudication sx's since about the Summer of 2018 when her fatigue increased; denies non-healing wounds, denies rest pain. Pt admits to not walking as much due to painful arthritis in herknees, andfeet at times, lack of padding in the soles of her feet  States her last TIA was about 1995 as manifested by feeling dizzy and nauseated for about 15 minutes; she was then told by a medical provider that she had TIA's. She did nothave hemiplegia, monocular blindness, facial drooping, or aphasia. Her DM is in good control, she stopped smoking in the 1990's, she takes a daily ASA and a statin, she is obese. She rarely drinks ETOH.  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 She was diagnosed with bone marrow cancer early in 2020. Had chemo recently, could not get the next round of chemo due to low WBC. Severe fatigue limits her walking.  Right foot is also painful from neuropathy and arthritis.   On endoscopy it was found she has a hiatal hernia, diverticulosis; a small amount of fresh blood was found in the small intestine; she is anemic, had iron infusions, states she may need a port to draw blood since her  veins are so small.   She has known c-spine ans L-spine issues.   Diabetic: Yes, 5.5 A1C on 11-04-18,  good control  Tobacco use: former smoker, quit in the 1990's  Pt meds include: Statin :Yes Betablocker: No ASA: yes Other anticoagulants/antiplatelets: no   Past Medical History:  Diagnosis Date  . Arthritis   . CAD (coronary artery disease)   . Cancer ([place])   . Carotid artery occlusion   . Cataracts, bilateral   . DDD (degenerative disc disease)   . Diabetes mellitus    Type 2  . Diverticulitis   . Dyspnea   . Fibromyalgia   . GERD (gastroesophageal reflux disease)   . Heart murmur   . Hemorrhoids   . History of shingles   . Impaired hearing   . Macular degeneration   . Other and unspecified hyperlipidemia   . PVD (peripheral vascular disease) ([place])   . TIA (transient ischemic attack)   . Unspecified essential hypertension   . Varicose veins     Social History Social History   Tobacco Use  . Smoking status: Former Smoker    Types: Cigarettes    Quit date: 03/11/1985    Years since quitting: 33.9  . Smokeless tobacco: Never Used  Substance Use Topics  . Alcohol use: No    Alcohol/week: 0.0 standard drinks  . Drug use: No    Family History Family History  Problem Relation Age of Onset  . Stroke Mother   .  Hypertension Mother   . Stroke Father   . Hypertension Father   . Diabetes Father   . Coronary artery disease Father   . Heart disease Father   . Diabetes Sister   . Hypertension Sister   . Lung cancer Brother   . Sinusitis Brother   . Healthy Brother   . Diabetes Son   . Hypertension Son   . Coronary artery disease Other     Past Surgical History:  Procedure Laterality Date  . ANGIOPLASTY     left leg 2003  . APPENDECTOMY    . aspiration of a cyst in the right breast    . BALLOON DILATION N/A 10/01/2013   Procedure: BALLOON DILATION;  Surgeon: Rogene Houston, MD;  [facility];  Service: Endoscopy;  Laterality: N/A;  .  BREAST BIOPSY    . BREAST BIOPSY Left   . CARDIAC CATHETERIZATION    . CAROTID ENDARTERECTOMY  12-03-06   left CEA  . CATARACT EXTRACTION W/PHACO Right 10/14/2013   Procedure: CATARACT EXTRACTION PHACO AND INTRAOCULAR LENS PLACEMENT (IOC);  Surgeon: Tonny Branch, MD;  Location: [facility];  Service: Ophthalmology;  Laterality: Right;  CDE 7.20  . CATARACT EXTRACTION W/PHACO Left 11/04/2013   Procedure: CATARACT EXTRACTION PHACO AND INTRAOCULAR LENS PLACEMENT (IOC);  Surgeon: Tonny Branch, MD;  Location: [facility];  Service: Ophthalmology;  Laterality: Left;  CDE 8.57  . COLONOSCOPY N/A 09/19/2014   Procedure: COLONOSCOPY;  Surgeon: Rogene Houston, MD;  [facility];  Service: Endoscopy;  Laterality: N/A;  7:30-8:30  . CYSTO WITH HYDRODISTENSION N/A 03/29/2016   Procedure: CYSTOSCOPY/HYDRODISTENSION;  Surgeon: Irine Seal, MD;  Location: [facility];  Service: Urology;  Laterality: N/A;  . CYSTOSCOPY WITH INJECTION N/A 03/29/2016   Procedure: CYSTOSCOPY WITH PYRIDIUM AND MARCAINE INSTALLATION;  Surgeon: Irine Seal, MD;  Location: [facility];  Service: Urology;  Laterality: N/A;  . ESOPHAGOGASTRODUODENOSCOPY N/A 10/01/2013   Procedure: ESOPHAGOGASTRODUODENOSCOPY (EGD);  Surgeon: Rogene Houston, MD;  [facility];  Service: Endoscopy;  Laterality: N/A;  120  . ESOPHAGOGASTRODUODENOSCOPY N/A 10/24/2014   Procedure: ESOPHAGOGASTRODUODENOSCOPY (EGD);  Surgeon: Rogene Houston, MD;  [facility];  Service: Endoscopy;  Laterality: N/A;  730  . GIVENS CAPSULE STUDY N/A 10/24/2014   Procedure: GIVENS CAPSULE STUDY;  Surgeon: Rogene Houston, MD;  [facility];  Service: Endoscopy;  Laterality: N/A;  . JOINT REPLACEMENT  2011   Right total knee replacement  . [place]  . KNEE ARTHROSCOPY  2009  . MALONEY DILATION N/A 10/01/2013   Procedure: Venia Minks DILATION;  Surgeon: Rogene Houston, MD;  [facility];  Service: Endoscopy;  Laterality: N/A;  . SAVORY  DILATION N/A 10/01/2013   Procedure: SAVORY DILATION;  Surgeon: Rogene Houston, MD;  [facility];  Service: Endoscopy;  Laterality: N/A;  . SPINE SURGERY  1997   C5-c6 fusion  . TOTAL ABDOMINAL HYSTERECTOMY  1973    Allergies  Allergen Reactions  . Amoxicillin-Pot Clavulanate Hives and Other (See Comments)    Has patient had a PCN reaction causing immediate rash, facial/tongue/throat swelling, SOB or lightheadedness with hypotension: UNKNOWN Has patient had a PCN reaction causing severe rash involving mucus membranes or skin necrosis: UNKNOWN Has patient had a PCN reaction that required hospitalization No Has patient had a PCN reaction occurring within the last 10 years: No If all of the above answers are "NO", then may proceed with Cephalosporin  use.   . Byetta 10 Mcg Pen [Exenatide] Other (See Comments)    Unknown  . Ceclor [Cefaclor] Other (See Comments)    Unknown  . Celexa [Citalopram Hydrobromide] Other (See Comments)    Sick  . Ciprofloxacin Nausea Only and Other (See Comments)    Dizziness  . Claritin [Loratadine] Other (See Comments)    Hair fell out  . Erythromycin Other (See Comments)    REACTION: Upset stomach  . Flexeril [Cyclobenzaprine] Other (See Comments)    Nervous  . Fosamax [Alendronate] Other (See Comments)    Made pt sick  . Jardiance [Empagliflozin] Other (See Comments)    Vaginal infection, UTI  . Motrin [Ibuprofen] Nausea Only  . Naproxen Other (See Comments)    REACTION: severe nausea  . Norvasc [Amlodipine Besylate] Other (See Comments)    Unknown  . Prednisone Other (See Comments)    REACTION: itchy rash  . Prozac [Fluoxetine Hcl] Other (See Comments)    Sick  . Sulfamethoxazole-Trimethoprim Nausea And Vomiting and Other (See Comments)    REACTION: Upset stomach    Current Outpatient Medications  Medication Sig Dispense Refill  . ACCU-CHEK AVIVA PLUS test strip TEST FOUR TIMES DAILY AS DIRECTED 150 each 5  . Accu-Chek Softclix  Lancets lancets USE ONE DAILY AS DIRECTED - THIS IS A 100 DAY SUPPLY 100 each 5  . allopurinol (ZYLOPRIM) 100 MG tablet Take by mouth.    Marland Kitchen allopurinol (ZYLOPRIM) 100 MG tablet TAKE 1 TABLET BY MOUTH EVERY DAY -- pt has new DOSE ON file TWICE DAILY now fill new rx ON hold kd 11/12    . Alum Hydroxide-Mag Trisilicate (GAVISCON) [id number] MG CHEW Chew 2-3 tablets by mouth daily as needed (acid reflux).     . butalbital-acetaminophen-caffeine (FIORICET) 50-325-40 MG tablet Take 1 tablet by mouth 5 (five) times daily as needed.    . Carboxymeth-Glycerin-Polysorb (REFRESH OPTIVE ADVANCED) 0.5-1-0.5 % SOLN Apply 1 drop to eye daily as needed (for dry eyes).     . cephALEXin (KEFLEX) 250 MG capsule Take 250 mg by mouth 3 (three) times daily.    . cloNIDine (CATAPRES) 0.1 MG tablet Take 0.1-0.2 mg by mouth See admin instructions. TAKES 0.1 MG IN THE MORNING AND 0.2 MG AT BEDTIME    . Darbepoetin Alfa 60 MCG/ML SOLN Inject into the skin every 30 (thirty) days.    Marland Kitchen esomeprazole (NEXIUM) 20 MG capsule Take 20 mg by mouth daily before breakfast.     . fexofenadine (ALLEGRA) 30 MG/5ML suspension Take 30 mg by mouth daily as needed (for allergies). TAKES CHILDREN'S LIQUID     . gabapentin (NEURONTIN) 100 MG capsule Take 1 capsule by mouth 3 (three) times daily as needed.    Marland Kitchen GLOBAL EASE INJECT PEN NEEDLES 32G X 4 MM MISC USE ONCE DAILY WITH SOLOSTAR INSULIN 100 each 3  . hydrALAZINE (APRESOLINE) 25 MG tablet Take 25 mg by mouth daily.     . Insulin Glargine (LANTUS SOLOSTAR) 100 UNIT/ML Solostar Pen INJECT 30 UNITS INTO THE SKIN EVERY DAY AT 10:00 PM 15 mL 2  . ketotifen (ALAWAY) 0.025 % ophthalmic solution Place 1 drop into both eyes daily as needed (for allergies).     Marland Kitchen levothyroxine (SYNTHROID) 75 MCG tablet TAKE 1 TABLET BY MOUTH EVERY DAY BEFORE BREAKFAST 30 tablet 5  . losartan (COZAAR) 100 MG tablet Take 100 mg by mouth daily.      . Methylcellulose, Laxative, (CITRUCEL PO) Take 1 each by mouth every  other  day.    . Multiple Vitamins-Minerals (PRESERVISION AREDS 2 PO) Take 1-2 tablets by mouth See admin instructions. Take 1 capsule by mouth every other day alternating with 2 capsules by mouth every other day    . NIFEdipine (ADALAT CC) 30 MG 24 hr tablet Take 30 mg by mouth daily.    Marland Kitchen NIFEdipine (PROCARDIA-XL/ADALAT-CC/NIFEDICAL-XL) 30 MG 24 hr tablet Take 30 mg by mouth daily.    Marland Kitchen oxyCODONE-acetaminophen (PERCOCET/ROXICET) 5-325 MG tablet Take 1 tablet by mouth every 6 (six) hours as needed.    . predniSONE (DELTASONE) 5 MG tablet Take by mouth.    . predniSONE (STERAPRED UNI-PAK 21 TAB) 5 MG (21) TBPK tablet take as directed PER package    . pregabalin (LYRICA) 25 MG capsule Take 25 mg by mouth 3 (three) times daily.    Marland Kitchen Propylene Glycol (SYSTANE BALANCE OP) Apply 1 drop to eye daily as needed (for dry eyes).     . simvastatin (ZOCOR) 10 MG tablet Take 10 mg by mouth every other day.    . torsemide (DEMADEX) 20 MG tablet Take 20 mg by mouth daily.     . traZODone (DESYREL) 100 MG tablet Take 100 mg by mouth at bedtime.    . triamcinolone cream (KENALOG) 0.1 %      No current facility-administered medications for this visit.    Facility-Administered Medications Ordered in Other Visits  Medication Dose Route Frequency Provider Last Rate Last Dose  . vancomycin (VANCOCIN) 1,000 mg in sodium chloride 0.9 % 500 mL IVPB  1,000 mg Intravenous Once Perkins, Alexzandrew L, PA-C        ROS: See HPI for pertinent positives and negatives.   Physical Examination  Vitals:   02/11/19 1131  BP: (!) 160/67  Pulse: 77  Resp: 16  Temp: 97.9 F (36.6 C)  TempSrc: Temporal  SpO2: 96%  Weight: 165 lb (74.8 kg)  Height: 5\' 3"  (1.6 m)   Body mass index is 29.23 kg/m.  General: A&O x 3, WDWN, elderly female in obvious pain from right foot. Gait: limp HEENT: No gross abnormalities.  Pulmonary: Respirations are non labored, CTAB, good air movement in all fields Cardiac: regular rhythm, +  low grade murmur.         Carotid Bruits Right Left   Positive Positive   Radial pulses are 2+ palpable bilaterally   Adominal aortic pulse is not palpable                         VASCULAR EXAM: Extremities with ischemic changes at right foot, with Gangrene at right great toe tip; with open wound at right great toe. See photos below                                                                                                                LE Pulses Right Left       FEMORAL  1+ palpable  2+ palpable        POPLITEAL  not palpable   not palpable       POSTERIOR TIBIAL  not palpable   not palpable        DORSALIS PEDIS      ANTERIOR TIBIAL not palpable  not palpable    Abdomen: soft, NT, no palpable masses. Skin: no rashes, see Extremities. General pallor Musculoskeletal: no muscle wasting or atrophy.  Neurologic: A&O X 3; appropriate affect, Sensation is normal; MOTOR FUNCTION:  moving all extremities equally, motor strength 5/5 in upper extremities, 3/5 in right LE, 4/5 in left LE.Marland Kitchen Speech is fluent/normal. CN 2-12 intact. Psychiatric: Thought content is normal, mood appropriate for clinical situation.    DATA  ABI (Date: 02/11/2019): ABI Findings: +---------+-----------------+-----+------------------+-------------------------+ Right    Rt Pressure      IndexWaveform          Comment                            (mmHg)                                                            +---------+-----------------+-----+------------------+-------------------------+ Brachial 175                   triphasic                                   +---------+-----------------+-----+------------------+-------------------------+ PTA      255              1.46 dampened          Non-compressible                                         monophasic                                  +---------+-----------------+-----+------------------+-------------------------+  DP                             dampened          Unable to obtain due to                                  monophasic        low amplitude             +---------+-----------------+-----+------------------+-------------------------+ Great Toe                                        Unable to obtain due to                                                    open wound                +---------+-----------------+-----+------------------+-------------------------+  +---------+------------------+-----+---------+-------+  Left     Lt Pressure (mmHg)IndexWaveform Comment +---------+------------------+-----+---------+-------+ Brachial 170                    triphasic        +---------+------------------+-----+---------+-------+ PTA      188               1.07 biphasic         +---------+------------------+-----+---------+-------+ DP       213               1.22 biphasic         +---------+------------------+-----+---------+-------+ Great Toe42                0.24 Abnormal         +---------+------------------+-----+---------+-------+  +-------+----------------+----------------+------------+------------+ ABI/TBIToday's ABI     Today's TBI     Previous ABIPrevious TBI +-------+----------------+----------------+------------+------------+ Right  Non-compressibleUnable to obtain0.80        0.16         +-------+----------------+----------------+------------+------------+ Left   1.22            0.24            0.93        0.45         +-------+----------------+----------------+------------+------------+ Arterial wall calcification precludes accurate ankle pressures and ABIs.   Summary: Right: Resting right ankle-brachial index indicates noncompressible right lower extremity arteries. The right toe-brachial index is abnormal.  Left: Resting left ankle-brachial index is within normal range. No evidence of significant left lower  extremity arterial disease. The left toe-brachial index is abnormal. ABIs are unreliable. LT Great toe pressure = 42 mmHg. Although ankle brachial indices are within normal limits (0.95-1.29), arterial Doppler waveforms at the ankle suggest some component of arterial occlusive disease.   ABI Findings (05-19-18): +---------+------------------+-----+-------------------+--------+ Right    Rt Pressure (mmHg)IndexWaveform           Comment  +---------+------------------+-----+-------------------+--------+ Brachial 127                                                +---------+------------------+-----+-------------------+--------+ ATA      109               0.80 dampened monophasic         +---------+------------------+-----+-------------------+--------+ PTA      52                0.38 dampened monophasic         +---------+------------------+-----+-------------------+--------+ Great Toe22                0.16                             +---------+------------------+-----+-------------------+--------+  +---------+------------------+-----+----------+-------+ Left     Lt Pressure (mmHg)IndexWaveform  Comment +---------+------------------+-----+----------+-------+ Brachial 137                                      +---------+------------------+-----+----------+-------+ ATA      128               0.93 monophasic        +---------+------------------+-----+----------+-------+ PTA      99  0.72 monophasic        +---------+------------------+-----+----------+-------+ Great Toe62                0.45                   +---------+------------------+-----+----------+-------+  +-------+-----------+-----------+------------+------------+ ABI/TBIToday's ABIToday's TBIPrevious ABIPrevious TBI +-------+-----------+-----------+------------+------------+ Right  0.8        0.16       0.47        0.34          +-------+-----------+-----------+------------+------------+ Left   0.93       0.45       0.67        0.42         +-------+-----------+-----------+------------+------------+ Previous ABI on 04/08/17.   Summary: Right: Resting right ankle-brachial index indicates mild right lower extremity arterial disease. The right toe-brachial index is abnormal. ABIs are unreliable. RT great toe pressure = 22 mmHg.  Left: Resting left ankle-brachial index indicates mild left lower extremity arterial disease. The left toe-brachial index is abnormal. TBIs are unreliable. LT Great toe pressure = 62 mmHg.   CarotidDuplex(04/08/17): Right ICA: 1-39% stenosis Right ECA: >50% stenosis Left ICA: (CEA site), no stenosis Bilateral vertebral artery flow is antegrade.  Bilateral subclavian artery waveforms are normal. No significant change since the examson 03-28-15 and 04-02-16.    ASSESSMENT: Debra Hopkins is a 78 y.o. female whois status post left CEA in September 2008, left SFA PTA with stent in2003. She had a TIA in 1995, none since then.  She returns today with a non healing ulcer left great toe, severe ischemic pain of right foot since July 2020, and ruddy toes of right foot. I discussed pt with Dr. Scot Dock; will schedule for arteriogram with possible intervention right lower extremity on Monday 02-15-19. She already has and is taking Percocet 5/325, 1 tab po tid for pain, but the pain keeps her awake at night; I advised that she may take 2 tabs every 6 hours prn pain.   Her walking seems limited by severe fatigue from her chemo tx for bone marrow cancer and severe pain of right foot.   Her atherosclerotic risk factors include currently controled DM, former smoker, bone marrow cancer, and CAD  Creatinine was 1.18 on 11-04-18, 1.83 nine months ago.     PLAN:  Schedule for arteriogram with possible intervention right lower extremity on Monday 02-15-19.   Clemon Chambers, RN, MSN, FNP-C  [facility] Phone: [phone]  Clinic MD: Laqueta Due  02/11/19 11:41 AM

## 2019-02-11 NOTE — Progress Notes (Signed)
Indicated on letter and reviewed with patient,  Instructions for day prior to and day procedure. Patient verbalized understanding.

## 2019-02-15 ENCOUNTER — Encounter (HOSPITAL_COMMUNITY)
Admission: RE | Disposition: A | Discharge status: Rehabilitation Facility | Payer: Self-pay | Source: Home / Self Care | Attending: Vascular Surgery

## 2019-02-15 ENCOUNTER — Other Ambulatory Visit: Payer: Self-pay

## 2019-02-15 ENCOUNTER — Inpatient Hospital Stay (HOSPITAL_COMMUNITY)
Admission: RE | Admit: 2019-02-15 | Discharge: 2019-03-02 | DRG: 271 | Disposition: A | Discharge status: Rehabilitation Facility | Payer: Medicare Other | Attending: Vascular Surgery | Admitting: Vascular Surgery

## 2019-02-15 DIAGNOSIS — I70261 Atherosclerosis of native arteries of extremities with gangrene, right leg: Secondary | ICD-10-CM | POA: Diagnosis not present

## 2019-02-15 DIAGNOSIS — Z880 Allergy status to penicillin: Secondary | ICD-10-CM

## 2019-02-15 DIAGNOSIS — Z8673 Personal history of transient ischemic attack (TIA), and cerebral infarction without residual deficits: Secondary | ICD-10-CM

## 2019-02-15 DIAGNOSIS — E1152 Type 2 diabetes mellitus with diabetic peripheral angiopathy with gangrene: Principal | ICD-10-CM | POA: Diagnosis not present

## 2019-02-15 DIAGNOSIS — Z79899 Other long term (current) drug therapy: Secondary | ICD-10-CM

## 2019-02-15 DIAGNOSIS — E039 Hypothyroidism, unspecified: Secondary | ICD-10-CM | POA: Diagnosis present

## 2019-02-15 DIAGNOSIS — T81718A Complication of other artery following a procedure, not elsewhere classified, initial encounter: Secondary | ICD-10-CM | POA: Diagnosis not present

## 2019-02-15 DIAGNOSIS — Z882 Allergy status to sulfonamides status: Secondary | ICD-10-CM

## 2019-02-15 DIAGNOSIS — Z419 Encounter for procedure for purposes other than remedying health state, unspecified: Secondary | ICD-10-CM

## 2019-02-15 DIAGNOSIS — Z881 Allergy status to other antibiotic agents status: Secondary | ICD-10-CM

## 2019-02-15 DIAGNOSIS — Z888 Allergy status to other drugs, medicaments and biological substances status: Secondary | ICD-10-CM

## 2019-02-15 DIAGNOSIS — H3530 Unspecified macular degeneration: Secondary | ICD-10-CM | POA: Diagnosis present

## 2019-02-15 DIAGNOSIS — Z87891 Personal history of nicotine dependence: Secondary | ICD-10-CM

## 2019-02-15 DIAGNOSIS — Z89611 Acquired absence of right leg above knee: Secondary | ICD-10-CM

## 2019-02-15 DIAGNOSIS — G546 Phantom limb syndrome with pain: Secondary | ICD-10-CM | POA: Diagnosis not present

## 2019-02-15 DIAGNOSIS — E11621 Type 2 diabetes mellitus with foot ulcer: Secondary | ICD-10-CM | POA: Diagnosis present

## 2019-02-15 DIAGNOSIS — Z8249 Family history of ischemic heart disease and other diseases of the circulatory system: Secondary | ICD-10-CM

## 2019-02-15 DIAGNOSIS — Z20828 Contact with and (suspected) exposure to other viral communicable diseases: Secondary | ICD-10-CM | POA: Diagnosis present

## 2019-02-15 DIAGNOSIS — K219 Gastro-esophageal reflux disease without esophagitis: Secondary | ICD-10-CM | POA: Diagnosis present

## 2019-02-15 DIAGNOSIS — E876 Hypokalemia: Secondary | ICD-10-CM | POA: Diagnosis not present

## 2019-02-15 DIAGNOSIS — D62 Acute posthemorrhagic anemia: Secondary | ICD-10-CM | POA: Diagnosis not present

## 2019-02-15 DIAGNOSIS — M797 Fibromyalgia: Secondary | ICD-10-CM | POA: Diagnosis present

## 2019-02-15 DIAGNOSIS — I2510 Atherosclerotic heart disease of native coronary artery without angina pectoris: Secondary | ICD-10-CM | POA: Diagnosis present

## 2019-02-15 DIAGNOSIS — E785 Hyperlipidemia, unspecified: Secondary | ICD-10-CM | POA: Diagnosis present

## 2019-02-15 DIAGNOSIS — Z8619 Personal history of other infectious and parasitic diseases: Secondary | ICD-10-CM

## 2019-02-15 DIAGNOSIS — Z833 Family history of diabetes mellitus: Secondary | ICD-10-CM

## 2019-02-15 DIAGNOSIS — I70235 Atherosclerosis of native arteries of right leg with ulceration of other part of foot: Secondary | ICD-10-CM

## 2019-02-15 DIAGNOSIS — R443 Hallucinations, unspecified: Secondary | ICD-10-CM | POA: Diagnosis not present

## 2019-02-15 DIAGNOSIS — Y838 Other surgical procedures as the cause of abnormal reaction of the patient, or of later complication, without mention of misadventure at the time of the procedure: Secondary | ICD-10-CM | POA: Diagnosis not present

## 2019-02-15 DIAGNOSIS — L97519 Non-pressure chronic ulcer of other part of right foot with unspecified severity: Secondary | ICD-10-CM | POA: Diagnosis present

## 2019-02-15 DIAGNOSIS — Z823 Family history of stroke: Secondary | ICD-10-CM

## 2019-02-15 DIAGNOSIS — Z6831 Body mass index (BMI) 31.0-31.9, adult: Secondary | ICD-10-CM

## 2019-02-15 DIAGNOSIS — I739 Peripheral vascular disease, unspecified: Secondary | ICD-10-CM | POA: Diagnosis present

## 2019-02-15 DIAGNOSIS — E669 Obesity, unspecified: Secondary | ICD-10-CM | POA: Diagnosis present

## 2019-02-15 DIAGNOSIS — I724 Aneurysm of artery of lower extremity: Secondary | ICD-10-CM | POA: Diagnosis not present

## 2019-02-15 DIAGNOSIS — I10 Essential (primary) hypertension: Secondary | ICD-10-CM | POA: Diagnosis present

## 2019-02-15 DIAGNOSIS — Z801 Family history of malignant neoplasm of trachea, bronchus and lung: Secondary | ICD-10-CM

## 2019-02-15 DIAGNOSIS — I743 Embolism and thrombosis of arteries of the lower extremities: Secondary | ICD-10-CM | POA: Diagnosis not present

## 2019-02-15 DIAGNOSIS — Z96651 Presence of right artificial knee joint: Secondary | ICD-10-CM | POA: Diagnosis present

## 2019-02-15 DIAGNOSIS — E1142 Type 2 diabetes mellitus with diabetic polyneuropathy: Secondary | ICD-10-CM | POA: Diagnosis present

## 2019-02-15 DIAGNOSIS — K5900 Constipation, unspecified: Secondary | ICD-10-CM | POA: Diagnosis present

## 2019-02-15 DIAGNOSIS — I998 Other disorder of circulatory system: Secondary | ICD-10-CM | POA: Diagnosis present

## 2019-02-15 HISTORY — PX: ABDOMINAL AORTOGRAM W/LOWER EXTREMITY: CATH118223

## 2019-02-15 HISTORY — PX: PERIPHERAL VASCULAR INTERVENTION: CATH118257

## 2019-02-15 LAB — CREATININE, SERUM
Creatinine, Ser: 1.49 mg/dL — ABNORMAL HIGH (ref 0.44–1.00)
GFR calc Af Amer: 38 mL/min — ABNORMAL LOW (ref >60)
GFR calc non Af Amer: 33 mL/min — ABNORMAL LOW (ref >60)

## 2019-02-15 LAB — CBC
HCT: 22.7 % — ABNORMAL LOW (ref 36.0–46.0)
Hemoglobin: 7.7 g/dL — ABNORMAL LOW (ref 12.0–15.0)
MCH: 31.4 pg (ref 26.0–34.0)
MCHC: 33.9 g/dL (ref 30.0–36.0)
MCV: 92.7 fL (ref 80.0–100.0)
Platelets: 276 10*3/uL (ref 150–400)
RBC: 2.45 MIL/uL — ABNORMAL LOW (ref 3.87–5.11)
RDW: 18 % — ABNORMAL HIGH (ref 11.5–15.5)
WBC: 2.3 10*3/uL — ABNORMAL LOW (ref 4.0–10.5)
nRBC: 0 % (ref 0.0–0.2)

## 2019-02-15 LAB — POCT I-STAT, CHEM 8
BUN: 34 mg/dL — ABNORMAL HIGH (ref 8–23)
Calcium, Ion: 1.06 mmol/L — ABNORMAL LOW (ref 1.15–1.40)
Chloride: 109 mmol/L (ref 98–111)
Creatinine, Ser: 1.2 mg/dL — ABNORMAL HIGH (ref 0.44–1.00)
Glucose, Bld: 115 mg/dL — ABNORMAL HIGH (ref 70–99)
HCT: 24 % — ABNORMAL LOW (ref 36.0–46.0)
Hemoglobin: 8.2 g/dL — ABNORMAL LOW (ref 12.0–15.0)
Potassium: 4.3 mmol/L (ref 3.5–5.1)
Sodium: 138 mmol/L (ref 135–145)
TCO2: 21 mmol/L — ABNORMAL LOW (ref 22–32)

## 2019-02-15 LAB — GLUCOSE, CAPILLARY: Glucose-Capillary: 145 mg/dL — ABNORMAL HIGH (ref 70–99)

## 2019-02-15 SURGERY — ABDOMINAL AORTOGRAM W/LOWER EXTREMITY
Anesthesia: LOCAL | Laterality: Right

## 2019-02-15 MED ORDER — LABETALOL HCL 5 MG/ML IV SOLN
20.0000 mg | INTRAVENOUS | Status: AC | PRN
  Administered 2019-02-15 (×2): 20 mg via INTRAVENOUS
  Administered 2019-02-16: 5 mg via INTRAVENOUS
  Administered 2019-02-16: 20 mg via INTRAVENOUS
  Administered 2019-02-16 (×3): 5 mg via INTRAVENOUS
  Filled 2019-02-15 (×3): qty 4

## 2019-02-15 MED ORDER — DIPHENHYDRAMINE HCL 50 MG/ML IJ SOLN
INTRAMUSCULAR | Status: AC
  Filled 2019-02-15: qty 1

## 2019-02-15 MED ORDER — LIDOCAINE HCL (PF) 1 % IJ SOLN
INTRAMUSCULAR | Status: AC
  Filled 2019-02-15: qty 30

## 2019-02-15 MED ORDER — CLONIDINE HCL 0.2 MG PO TABS
0.2000 mg | ORAL_TABLET | Freq: Every day | ORAL | Status: DC
  Administered 2019-02-16 – 2019-02-24 (×9): 0.2 mg via ORAL
  Filled 2019-02-15 (×9): qty 1

## 2019-02-15 MED ORDER — SODIUM CHLORIDE 0.9% FLUSH
3.0000 mL | Freq: Two times a day (BID) | INTRAVENOUS | Status: DC
  Administered 2019-02-16 – 2019-03-01 (×10): 3 mL via INTRAVENOUS

## 2019-02-15 MED ORDER — CLOPIDOGREL BISULFATE 75 MG PO TABS: 300.0000 mg | ORAL_TABLET | Freq: Once | ORAL | Status: AC

## 2019-02-15 MED ORDER — SODIUM CHLORIDE 0.9 % IV SOLN: INTRAVENOUS | Status: AC

## 2019-02-15 MED ORDER — CLOPIDOGREL BISULFATE 300 MG PO TABS
ORAL_TABLET | ORAL | Status: AC
  Filled 2019-02-15: qty 1

## 2019-02-15 MED ORDER — SODIUM CHLORIDE 0.9 % IV SOLN: 250.0000 mL | INTRAVENOUS | Status: DC | PRN

## 2019-02-15 MED ORDER — SODIUM CHLORIDE 0.9% FLUSH: 3.0000 mL | INTRAVENOUS | Status: DC | PRN

## 2019-02-15 MED ORDER — OXYCODONE HCL 5 MG PO TABS
5.0000 mg | ORAL_TABLET | ORAL | Status: DC | PRN
  Administered 2019-02-16 – 2019-02-28 (×25): 10 mg via ORAL
  Administered 2019-02-28: 5 mg via ORAL
  Administered 2019-03-01 – 2019-03-02 (×3): 10 mg via ORAL
  Filled 2019-02-15 (×20): qty 2
  Filled 2019-02-15: qty 1
  Filled 2019-02-15 (×10): qty 2

## 2019-02-15 MED ORDER — ACETAMINOPHEN 325 MG PO TABS: 650.0000 mg | ORAL_TABLET | ORAL | Status: DC | PRN

## 2019-02-15 MED ORDER — ACETAMINOPHEN 325 MG PO TABS
650.0000 mg | ORAL_TABLET | ORAL | Status: DC | PRN
  Administered 2019-02-16 – 2019-03-01 (×8): 650 mg via ORAL
  Filled 2019-02-15 (×8): qty 2

## 2019-02-15 MED ORDER — OXYCODONE HCL 5 MG PO TABS: 5.0000 mg | ORAL_TABLET | ORAL | Status: DC | PRN

## 2019-02-15 MED ORDER — HYDRALAZINE HCL 20 MG/ML IJ SOLN
INTRAMUSCULAR | Status: AC
  Filled 2019-02-15: qty 1

## 2019-02-15 MED ORDER — LABETALOL HCL 5 MG/ML IV SOLN
INTRAVENOUS | Status: DC | PRN
  Administered 2019-02-15 (×2): 10 mg via INTRAVENOUS

## 2019-02-15 MED ORDER — ONDANSETRON HCL 4 MG/2ML IJ SOLN: 4.0000 mg | Freq: Four times a day (QID) | INTRAMUSCULAR | Status: DC | PRN

## 2019-02-15 MED ORDER — LABETALOL HCL 5 MG/ML IV SOLN
10.0000 mg | INTRAVENOUS | Status: DC | PRN
  Administered 2019-02-15 (×2): 10 mg via INTRAVENOUS

## 2019-02-15 MED ORDER — HYDROMORPHONE HCL 1 MG/ML IJ SOLN
1.0000 mg | Freq: Once | INTRAMUSCULAR | Status: AC
  Administered 2019-02-15: 1 mg via INTRAVENOUS

## 2019-02-15 MED ORDER — FENTANYL CITRATE (PF) 100 MCG/2ML IJ SOLN
INTRAMUSCULAR | Status: AC
  Filled 2019-02-15: qty 2

## 2019-02-15 MED ORDER — IODIXANOL 320 MG/ML IV SOLN
INTRAVENOUS | Status: DC | PRN
  Administered 2019-02-15: 215 mL via INTRA_ARTERIAL

## 2019-02-15 MED ORDER — CLOPIDOGREL BISULFATE 75 MG PO TABS
75.0000 mg | ORAL_TABLET | Freq: Every day | ORAL | Status: DC
  Administered 2019-02-16 – 2019-02-17 (×2): 75 mg via ORAL
  Filled 2019-02-15 (×2): qty 1

## 2019-02-15 MED ORDER — HYDROMORPHONE HCL 1 MG/ML IJ SOLN
0.5000 mg | INTRAMUSCULAR | Status: DC | PRN
  Administered 2019-02-15 – 2019-02-16 (×3): 1 mg via INTRAVENOUS
  Filled 2019-02-15 (×3): qty 1

## 2019-02-15 MED ORDER — CLOPIDOGREL BISULFATE 75 MG PO TABS: 75.0000 mg | ORAL_TABLET | Freq: Every day | ORAL | 11 refills | Status: DC

## 2019-02-15 MED ORDER — HEPARIN SODIUM (PORCINE) 1000 UNIT/ML IJ SOLN
INTRAMUSCULAR | Status: AC
  Filled 2019-02-15: qty 1

## 2019-02-15 MED ORDER — HYDROMORPHONE HCL 1 MG/ML IJ SOLN
0.5000 mg | INTRAMUSCULAR | Status: DC | PRN
  Administered 2019-02-15: 1 mg via INTRAVENOUS

## 2019-02-15 MED ORDER — LABETALOL HCL 5 MG/ML IV SOLN
INTRAVENOUS | Status: AC
  Filled 2019-02-15: qty 4

## 2019-02-15 MED ORDER — SODIUM CHLORIDE 0.9 % IV SOLN: INTRAVENOUS | Status: DC

## 2019-02-15 MED ORDER — CLOPIDOGREL BISULFATE 75 MG PO TABS: 75.0000 mg | ORAL_TABLET | Freq: Every day | ORAL | Status: DC

## 2019-02-15 MED ORDER — ASPIRIN EC 81 MG PO TBEC
81.0000 mg | DELAYED_RELEASE_TABLET | Freq: Every day | ORAL | Status: DC
  Administered 2019-02-15 – 2019-03-02 (×16): 81 mg via ORAL
  Filled 2019-02-15 (×16): qty 1

## 2019-02-15 MED ORDER — HYDROMORPHONE HCL 1 MG/ML IJ SOLN
INTRAMUSCULAR | Status: AC
  Filled 2019-02-15: qty 1

## 2019-02-15 MED ORDER — SIMVASTATIN 20 MG PO TABS
40.0000 mg | ORAL_TABLET | Freq: Every day | ORAL | Status: DC
  Administered 2019-02-17 – 2019-03-01 (×13): 40 mg via ORAL
  Filled 2019-02-15 (×13): qty 2

## 2019-02-15 MED ORDER — HYDRALAZINE HCL 20 MG/ML IJ SOLN
5.0000 mg | INTRAMUSCULAR | Status: AC | PRN
  Administered 2019-02-15 – 2019-02-22 (×2): 5 mg via INTRAVENOUS
  Filled 2019-02-15: qty 1

## 2019-02-15 MED ORDER — ONDANSETRON HCL 4 MG/2ML IJ SOLN
4.0000 mg | Freq: Four times a day (QID) | INTRAMUSCULAR | Status: DC | PRN
  Administered 2019-02-16: 4 mg via INTRAVENOUS
  Filled 2019-02-15: qty 2

## 2019-02-15 MED ORDER — SODIUM CHLORIDE 0.9% FLUSH
3.0000 mL | Freq: Two times a day (BID) | INTRAVENOUS | Status: DC
  Administered 2019-02-16 – 2019-03-01 (×15): 3 mL via INTRAVENOUS

## 2019-02-15 MED ORDER — HEPARIN SODIUM (PORCINE) 1000 UNIT/ML IJ SOLN
INTRAMUSCULAR | Status: DC | PRN
  Administered 2019-02-15: 7000 [IU] via INTRAVENOUS
  Administered 2019-02-15: 5000 [IU] via INTRAVENOUS

## 2019-02-15 MED ORDER — HYDRALAZINE HCL 20 MG/ML IJ SOLN
20.0000 mg | INTRAMUSCULAR | Status: AC | PRN
  Administered 2019-02-21 (×2): 20 mg via INTRAVENOUS
  Filled 2019-02-15 (×2): qty 1

## 2019-02-15 MED ORDER — DIPHENHYDRAMINE HCL 50 MG/ML IJ SOLN
INTRAMUSCULAR | Status: DC | PRN
  Administered 2019-02-15: 25 mg via INTRAVENOUS

## 2019-02-15 MED ORDER — HYDRALAZINE HCL 25 MG PO TABS
25.0000 mg | ORAL_TABLET | Freq: Every day | ORAL | Status: DC
  Administered 2019-02-16 – 2019-03-01 (×13): 25 mg via ORAL
  Filled 2019-02-15 (×13): qty 1

## 2019-02-15 MED ORDER — HEPARIN (PORCINE) IN NACL 1000-0.9 UT/500ML-% IV SOLN
INTRAVENOUS | Status: AC
  Filled 2019-02-15: qty 1000

## 2019-02-15 MED ORDER — TRAZODONE HCL 100 MG PO TABS
100.0000 mg | ORAL_TABLET | Freq: Every day | ORAL | Status: DC
  Administered 2019-02-16 – 2019-03-01 (×13): 100 mg via ORAL
  Filled 2019-02-15 (×14): qty 1

## 2019-02-15 MED ORDER — HEPARIN SODIUM (PORCINE) 5000 UNIT/ML IJ SOLN
5000.0000 [IU] | Freq: Three times a day (TID) | INTRAMUSCULAR | Status: DC
  Administered 2019-02-15 – 2019-02-16 (×2): 5000 [IU] via SUBCUTANEOUS
  Filled 2019-02-15 (×2): qty 1

## 2019-02-15 MED ORDER — FENTANYL CITRATE (PF) 100 MCG/2ML IJ SOLN
INTRAMUSCULAR | Status: DC | PRN
  Administered 2019-02-15 (×4): 50 ug via INTRAVENOUS

## 2019-02-15 MED ORDER — SODIUM CHLORIDE 0.9 % IV SOLN
INTRAVENOUS | Status: DC
  Administered 2019-02-15: 13:00:00 via INTRAVENOUS

## 2019-02-15 MED ORDER — CLOPIDOGREL BISULFATE 300 MG PO TABS
ORAL_TABLET | ORAL | Status: DC | PRN
  Administered 2019-02-15: 300 mg via ORAL

## 2019-02-15 MED ORDER — LIDOCAINE HCL (PF) 1 % IJ SOLN
INTRAMUSCULAR | Status: DC | PRN
  Administered 2019-02-15: 20 mL via INTRADERMAL
  Administered 2019-02-15: 2 mL via INTRADERMAL

## 2019-02-15 MED ORDER — HEPARIN (PORCINE) IN NACL 1000-0.9 UT/500ML-% IV SOLN
INTRAVENOUS | Status: DC | PRN
  Administered 2019-02-15 (×2): 500 mL

## 2019-02-15 SURGICAL SUPPLY — 32 items
BALLN STERLING OTW 4X220X150 (BALLOONS) ×3
BALLN STERLING OTW 5X220X150 (BALLOONS) ×3
BALLOON STERLING OTW 4X220X150 (BALLOONS) IMPLANT
BALLOON STERLING OTW 5X220X150 (BALLOONS) IMPLANT
CATH CXI 2.6F 65 ST (CATHETERS) ×3
CATH CXI SUPP 2.6F 150 ST (CATHETERS) ×1 IMPLANT
CATH OMNI FLUSH 5F 65CM (CATHETERS) ×1 IMPLANT
CATH QUICKCROSS SUPP .035X90CM (MICROCATHETER) ×1 IMPLANT
CATH SPRT STRG 65X2.6FR ACPT (CATHETERS) IMPLANT
CLOSURE MYNX CONTROL 6F/7F (Vascular Products) IMPLANT
DEVICE ONE SNARE 10MM (MISCELLANEOUS) ×1 IMPLANT
GLIDEWIRE ADV .035X260CM (WIRE) ×1 IMPLANT
KIT ENCORE 26 ADVANTAGE (KITS) ×1 IMPLANT
KIT MICROPUNCTURE NIT STIFF (SHEATH) ×1 IMPLANT
KIT PV (KITS) ×3 IMPLANT
PATCH THROMBIX TOPICAL PLAIN (HEMOSTASIS) ×1 IMPLANT
SHEATH FLEXOR ANSEL 1 7F 45CM (SHEATH) ×1 IMPLANT
SHEATH MICROPUNCTURE PEDAL 4FR (SHEATH) ×1 IMPLANT
SHEATH PINNACLE 5F 10CM (SHEATH) ×1 IMPLANT
SHEATH PINNACLE 7F 10CM (SHEATH) ×1 IMPLANT
SHEATH PROBE COVER 6X72 (BAG) ×1 IMPLANT
SHIELD RADPAD SCOOP 12X17 (MISCELLANEOUS) ×1 IMPLANT
STENT ELUVIA 6X100X130 (Permanent Stent) ×1 IMPLANT
STENT ELUVIA 6X120X130 (Permanent Stent) ×2 IMPLANT
STENT EXPRESS LD 7X37X75 (Permanent Stent) ×1 IMPLANT
STENT VIABAHN 5X150X120 (Permanent Stent) ×1 IMPLANT
SYR MEDRAD MARK V 150ML (SYRINGE) ×1 IMPLANT
TRANSDUCER W/STOPCOCK (MISCELLANEOUS) ×3 IMPLANT
TRAY PV CATH (CUSTOM PROCEDURE TRAY) ×3 IMPLANT
WIRE BENTSON .035X145CM (WIRE) ×1 IMPLANT
WIRE G V18X300CM (WIRE) ×2 IMPLANT
WIRE TORQFLEX AUST .018X40CM (WIRE) ×1 IMPLANT

## 2019-02-15 NOTE — Progress Notes (Signed)
Dr. [name] came to the bedside to re-evaluate pt being discharged to home this evening.  He decided to admit the patient in order to re-evaluate the left groin hematoma and to ensure her blood pressure and pain is well controlled through the night.  Dr. [name] spoke with her son to advise him of the plan for his mom and the son was agreeable.  The pt's blood pressure is improving, as well as her pain.  The left groin is unchanged.

## 2019-02-15 NOTE — Progress Notes (Signed)
No UOP. Purewick in place. Bladder feels firm and tender when palpated. Order received from Dr. [name] for foley catheter

## 2019-02-15 NOTE — H&P (Signed)
   History and Physical Update  The patient was interviewed and re-examined.  The patient's previous History and Physical has been reviewed and is unchanged from recent office visit. Plan for aortogram to evaluate right lower extremity with possible intervention.   Debra Kavan C. [name], MD [facility] [place] Office: [phone] Pager: [phone]   02/15/2019, 10:59 AM

## 2019-02-15 NOTE — Progress Notes (Signed)
The patient was complaining of severe abdominal pain upon arrival.  The pt has a hematoma at the left groin. Eddie Dibbles held pressure to the hematoma for 20 minutes with no change noted at the site. Pt's blood pressure is in the 200s and had been administered 2 doses of labetolol prior to arrival.  The patient's orientation is difficult to assess as she is very inconsistent with answers to questions.  Dr. [name] was paged and is at the bedside.  He has ordered a 1mg  dose of dilaudid IV and 10 mg of labetolol IV.  He did assess the area and does not feel the hematoma is instable.  I was unable to obtain a right pedal pulse via a doppler.  Dr. [name] was able to assess a popliteal by doppler.

## 2019-02-15 NOTE — Progress Notes (Signed)
Since foley inserted, approx 500cc clear yellow uop, patient appears  not be in as much pain.

## 2019-02-15 NOTE — Op Note (Signed)
Patient name: Debra Hopkins MRN: [id number] DOB: Nov 24, 1940 Sex: female  02/15/2019 Pre-operative Diagnosis: Critical right lower extremity ischemia with toe ulceration Post-operative diagnosis:  Same Surgeon:  Erlene Quan C. [name], MD Procedure Performed: 1.  Ultrasound-guided cannulation left common femoral artery 2.  Aortogram with bilateral lower extremity runoff 3.  Stent of right external iliac artery with 7 x 37 millimeters express balloon expandable 4.  Ultrasound-guided cannulation right posterior tibial artery 5.  Stent of right popliteal artery with 5 x 150 Viabahn 6.  Stent of right SFA with 6 x 120, 6 x 120, 6 x 14mm Elluvia 7.  Attempted Mynx device closure left common femoral artery   Indications: 78 year old female with history of intervention on the left SFA.  She now has toe ulceration on the right which has been acutely getting worse.  This causes her significant pain.  She has monophasic signals at the ankle ABI that is noncompressible.  She is indicated for aortogram possible intervention on the right.  Findings: Aorta and iliac segments were heavily calcified.  The right external leg artery was subtotally occluded.  After stenting with 7 mm balloon there was no flow-limiting stenosis or dissection.  On the left side she has an SFA occlusion appears to reconstitute popliteal has runoff via posterior tibial and peroneal arteries.  On the right side which is the site of interest the SFA occludes after 2 or 3 cm reconstitutes above-knee popliteal and appears to have runoff via the posterior tibial only.  We could not get through in an antegrade fashion.  We were able to get through in a retrograde fashion and snare just at the distal common femoral artery.  After stenting there is brisk flow to the level of the popliteal below the knee runoff is via the posterior tibial artery.  There is good signal at the posterior tibial at the ankle and brisk capillary refill.  Patient will be  high risk for below-knee amputation.  Patient was tearful and complaining of generalized pain throughout the procedure.  She did not have any specific complaints but the procedure was quite difficult given her inability to tolerate lying flat and catheter, wire, balloon, stent manipulation.   Procedure:  The patient was identified in the holding area and taken to room 8.  The patient was then placed supine on the table and prepped and draped in the usual sterile fashion.  A time out was called.  Ultrasound was used to evaluate the left common femoral artery.  This was heavily diseased.  The area was anesthetized 1% lidocaine.  This was cannulated micropuncture needle followed by wire and sheath.  And images saved the permanent record.  A Bentson wire was placed followed by 5 [place] sheath.  Omni catheter was placed to level L1 and aortogram was performed.  With the above findings I attempted to cross the bifurcation but could not given the stenosis.  I performed a pelvic angiography which demonstrated likely occluded external leg artery.  This time I placed a long 7 French sheath into the right common iliac artery.  I used the Glidewire advantage to cross and confirmed intraluminal access with quick cross catheter.  I then primarily stented the right external iliac artery with 7 mm balloon expandable stent.  I then replaced the dilator of the sheath and placed it down into the common femoral artery.  I screw cross catheter and Glidewire advantage to attempt to cross from above.  I was able to get all  the way to the knee but was in a dissection plane could not return.  Also attempted to use V 18 wire but could not all the way to the level of popliteal.  With this I used ultrasound to cannulate the posterior tibial artery with micropuncture needle, wire and sheath.  I then placed a CXI catheter followed by the team.  I attempted to snare this behind the knee and up in the SFA but could not.  I elected to go all  the way retrograde with a long CXI V 18.  I was ultimately able to snare it is just in the distal common femoral artery.  I pulled through and through access.  I balloon dilated the whole popliteal SFA segment with 4 mm balloon.  I then elected to stent with drug-eluting stents starting distal at the adductor.  This was a 6 x 120 followed by 6 x 120 followed by 6 x 151mm proximally.  These were all postdilated with 5 mm balloon.  Completion demonstrated still flow-limiting stenosis distally.  There did appear to reconstitute popliteal at the level of the knee.  I then placed a Viabahn covered stent distally.  This was deployed.  I then postdilated this with a 4 mm balloon.  Completion demonstrated flow all the way to the posterior tibial artery.  There did not appear to be any other tibials past the mid calf.  Flow was impeded by the sheath in the posterior tibial artery.  This was removed and pressure held.  I then pulled the sheath back over the wire in the left common femoral artery and perform retrograde angiography.  I exchanged over a Bentson wire for a short 7 [place] sheath.  I attempted to use a minx device but the balloon popped on calcification.  Pressure was held.  Other than her discomfort throughout the case she tolerated it okay.  At the completion she had a good signal of the posterior tibial all the way to the ankle brisk capillary refill.  Contrast: 215 cc   Batu Cassin C. [name], MD [facility] Office: [phone] Pager: [phone]

## 2019-02-16 ENCOUNTER — Observation Stay (HOSPITAL_COMMUNITY): Payer: Medicare Other | Admitting: Certified Registered Nurse Anesthetist

## 2019-02-16 ENCOUNTER — Encounter (HOSPITAL_COMMUNITY): Payer: Self-pay | Admitting: Vascular Surgery

## 2019-02-16 ENCOUNTER — Encounter (HOSPITAL_COMMUNITY)
Admission: RE | Disposition: A | Discharge status: Rehabilitation Facility | Payer: Self-pay | Source: Home / Self Care | Attending: Vascular Surgery

## 2019-02-16 ENCOUNTER — Observation Stay (HOSPITAL_COMMUNITY): Payer: Medicare Other

## 2019-02-16 ENCOUNTER — Inpatient Hospital Stay (HOSPITAL_COMMUNITY): Payer: Medicare Other

## 2019-02-16 DIAGNOSIS — T8144XA Sepsis following a procedure, initial encounter: Secondary | ICD-10-CM | POA: Diagnosis not present

## 2019-02-16 DIAGNOSIS — H3530 Unspecified macular degeneration: Secondary | ICD-10-CM | POA: Diagnosis present

## 2019-02-16 DIAGNOSIS — I70261 Atherosclerosis of native arteries of extremities with gangrene, right leg: Secondary | ICD-10-CM | POA: Diagnosis not present

## 2019-02-16 DIAGNOSIS — E1142 Type 2 diabetes mellitus with diabetic polyneuropathy: Secondary | ICD-10-CM | POA: Diagnosis present

## 2019-02-16 DIAGNOSIS — I998 Other disorder of circulatory system: Secondary | ICD-10-CM | POA: Diagnosis present

## 2019-02-16 DIAGNOSIS — R7309 Other abnormal glucose: Secondary | ICD-10-CM | POA: Diagnosis not present

## 2019-02-16 DIAGNOSIS — I2510 Atherosclerotic heart disease of native coronary artery without angina pectoris: Secondary | ICD-10-CM | POA: Diagnosis present

## 2019-02-16 DIAGNOSIS — Z89511 Acquired absence of right leg below knee: Secondary | ICD-10-CM | POA: Diagnosis not present

## 2019-02-16 DIAGNOSIS — Z823 Family history of stroke: Secondary | ICD-10-CM | POA: Diagnosis not present

## 2019-02-16 DIAGNOSIS — E1151 Type 2 diabetes mellitus with diabetic peripheral angiopathy without gangrene: Secondary | ICD-10-CM | POA: Diagnosis not present

## 2019-02-16 DIAGNOSIS — Z6831 Body mass index (BMI) 31.0-31.9, adult: Secondary | ICD-10-CM | POA: Diagnosis not present

## 2019-02-16 DIAGNOSIS — Z880 Allergy status to penicillin: Secondary | ICD-10-CM | POA: Diagnosis not present

## 2019-02-16 DIAGNOSIS — G546 Phantom limb syndrome with pain: Secondary | ICD-10-CM | POA: Diagnosis not present

## 2019-02-16 DIAGNOSIS — R52 Pain, unspecified: Secondary | ICD-10-CM

## 2019-02-16 DIAGNOSIS — D72819 Decreased white blood cell count, unspecified: Secondary | ICD-10-CM | POA: Diagnosis not present

## 2019-02-16 DIAGNOSIS — Z20828 Contact with and (suspected) exposure to other viral communicable diseases: Secondary | ICD-10-CM | POA: Diagnosis present

## 2019-02-16 DIAGNOSIS — I10 Essential (primary) hypertension: Secondary | ICD-10-CM | POA: Diagnosis not present

## 2019-02-16 DIAGNOSIS — D62 Acute posthemorrhagic anemia: Secondary | ICD-10-CM | POA: Diagnosis not present

## 2019-02-16 DIAGNOSIS — D61818 Other pancytopenia: Secondary | ICD-10-CM | POA: Diagnosis not present

## 2019-02-16 DIAGNOSIS — M797 Fibromyalgia: Secondary | ICD-10-CM | POA: Diagnosis present

## 2019-02-16 DIAGNOSIS — K219 Gastro-esophageal reflux disease without esophagitis: Secondary | ICD-10-CM | POA: Diagnosis present

## 2019-02-16 DIAGNOSIS — Z89611 Acquired absence of right leg above knee: Secondary | ICD-10-CM | POA: Diagnosis not present

## 2019-02-16 DIAGNOSIS — T81718A Complication of other artery following a procedure, not elsewhere classified, initial encounter: Secondary | ICD-10-CM | POA: Diagnosis not present

## 2019-02-16 DIAGNOSIS — Y838 Other surgical procedures as the cause of abnormal reaction of the patient, or of later complication, without mention of misadventure at the time of the procedure: Secondary | ICD-10-CM | POA: Diagnosis not present

## 2019-02-16 DIAGNOSIS — G479 Sleep disorder, unspecified: Secondary | ICD-10-CM | POA: Diagnosis not present

## 2019-02-16 DIAGNOSIS — I96 Gangrene, not elsewhere classified: Secondary | ICD-10-CM | POA: Diagnosis not present

## 2019-02-16 DIAGNOSIS — D619 Aplastic anemia, unspecified: Secondary | ICD-10-CM | POA: Diagnosis not present

## 2019-02-16 DIAGNOSIS — Z888 Allergy status to other drugs, medicaments and biological substances status: Secondary | ICD-10-CM | POA: Diagnosis not present

## 2019-02-16 DIAGNOSIS — Z882 Allergy status to sulfonamides status: Secondary | ICD-10-CM | POA: Diagnosis not present

## 2019-02-16 DIAGNOSIS — E1121 Type 2 diabetes mellitus with diabetic nephropathy: Secondary | ICD-10-CM | POA: Diagnosis not present

## 2019-02-16 DIAGNOSIS — N1831 Chronic kidney disease, stage 3a: Secondary | ICD-10-CM | POA: Diagnosis not present

## 2019-02-16 DIAGNOSIS — I739 Peripheral vascular disease, unspecified: Secondary | ICD-10-CM | POA: Diagnosis not present

## 2019-02-16 DIAGNOSIS — E785 Hyperlipidemia, unspecified: Secondary | ICD-10-CM | POA: Diagnosis present

## 2019-02-16 DIAGNOSIS — E669 Obesity, unspecified: Secondary | ICD-10-CM | POA: Diagnosis present

## 2019-02-16 DIAGNOSIS — R443 Hallucinations, unspecified: Secondary | ICD-10-CM | POA: Diagnosis not present

## 2019-02-16 DIAGNOSIS — N179 Acute kidney failure, unspecified: Secondary | ICD-10-CM | POA: Diagnosis not present

## 2019-02-16 DIAGNOSIS — E11621 Type 2 diabetes mellitus with foot ulcer: Secondary | ICD-10-CM | POA: Diagnosis present

## 2019-02-16 DIAGNOSIS — Z8673 Personal history of transient ischemic attack (TIA), and cerebral infarction without residual deficits: Secondary | ICD-10-CM | POA: Diagnosis not present

## 2019-02-16 DIAGNOSIS — I9789 Other postprocedural complications and disorders of the circulatory system, not elsewhere classified: Secondary | ICD-10-CM | POA: Diagnosis not present

## 2019-02-16 DIAGNOSIS — I70235 Atherosclerosis of native arteries of right leg with ulceration of other part of foot: Secondary | ICD-10-CM | POA: Diagnosis not present

## 2019-02-16 DIAGNOSIS — L97519 Non-pressure chronic ulcer of other part of right foot with unspecified severity: Secondary | ICD-10-CM | POA: Diagnosis present

## 2019-02-16 DIAGNOSIS — E039 Hypothyroidism, unspecified: Secondary | ICD-10-CM | POA: Diagnosis not present

## 2019-02-16 DIAGNOSIS — I724 Aneurysm of artery of lower extremity: Secondary | ICD-10-CM | POA: Diagnosis not present

## 2019-02-16 DIAGNOSIS — Z8249 Family history of ischemic heart disease and other diseases of the circulatory system: Secondary | ICD-10-CM | POA: Diagnosis not present

## 2019-02-16 DIAGNOSIS — E1152 Type 2 diabetes mellitus with diabetic peripheral angiopathy with gangrene: Secondary | ICD-10-CM | POA: Diagnosis not present

## 2019-02-16 DIAGNOSIS — Z4781 Encounter for orthopedic aftercare following surgical amputation: Secondary | ICD-10-CM | POA: Diagnosis not present

## 2019-02-16 DIAGNOSIS — T8141XA Infection following a procedure, superficial incisional surgical site, initial encounter: Secondary | ICD-10-CM | POA: Diagnosis not present

## 2019-02-16 HISTORY — PX: FALSE ANEURYSM REPAIR: SHX5152

## 2019-02-16 HISTORY — PX: FEMORAL-POPLITEAL BYPASS GRAFT: SHX937

## 2019-02-16 HISTORY — PX: LOWER EXTREMITY ANGIOGRAM: SHX5508

## 2019-02-16 LAB — GLUCOSE, CAPILLARY
Glucose-Capillary: 181 mg/dL — ABNORMAL HIGH (ref 70–99)
Glucose-Capillary: 302 mg/dL — ABNORMAL HIGH (ref 70–99)
Glucose-Capillary: 232 mg/dL — ABNORMAL HIGH (ref 70–99)

## 2019-02-16 LAB — CBC
HCT: 22.4 % — ABNORMAL LOW (ref 36.0–46.0)
Hemoglobin: 7.2 g/dL — ABNORMAL LOW (ref 12.0–15.0)
MCH: 31 pg (ref 26.0–34.0)
MCHC: 32.1 g/dL (ref 30.0–36.0)
MCV: 96.6 fL (ref 80.0–100.0)
Platelets: 241 10*3/uL (ref 150–400)
RBC: 2.32 MIL/uL — ABNORMAL LOW (ref 3.87–5.11)
RDW: 18.8 % — ABNORMAL HIGH (ref 11.5–15.5)
WBC: 2.1 10*3/uL — ABNORMAL LOW (ref 4.0–10.5)
nRBC: 0 % (ref 0.0–0.2)

## 2019-02-16 LAB — BASIC METABOLIC PANEL
Anion gap: 17 — ABNORMAL HIGH (ref 5–15)
BUN: 32 mg/dL — ABNORMAL HIGH (ref 8–23)
CO2: 15 mmol/L — ABNORMAL LOW (ref 22–32)
Calcium: 9.5 mg/dL (ref 8.9–10.3)
Chloride: 113 mmol/L — ABNORMAL HIGH (ref 98–111)
Creatinine, Ser: 1.56 mg/dL — ABNORMAL HIGH (ref 0.44–1.00)
GFR calc Af Amer: 36 mL/min — ABNORMAL LOW (ref >60)
GFR calc non Af Amer: 31 mL/min — ABNORMAL LOW (ref >60)
Glucose, Bld: 218 mg/dL — ABNORMAL HIGH (ref 70–99)
Potassium: 3.7 mmol/L (ref 3.5–5.1)
Sodium: 145 mmol/L (ref 135–145)

## 2019-02-16 LAB — PREPARE RBC (CROSSMATCH)

## 2019-02-16 LAB — SURGICAL PCR SCREEN
MRSA, PCR: NEGATIVE
Staphylococcus aureus: NEGATIVE

## 2019-02-16 LAB — HEPARIN LEVEL (UNFRACTIONATED): Heparin Unfractionated: <0.1 IU/mL — ABNORMAL LOW (ref 0.30–0.70)

## 2019-02-16 SURGERY — BYPASS GRAFT FEMORAL-POPLITEAL ARTERY
Anesthesia: General | Site: Leg Lower | Laterality: Right

## 2019-02-16 MED ORDER — SUGAMMADEX SODIUM 200 MG/2ML IV SOLN
INTRAVENOUS | Status: DC | PRN
  Administered 2019-02-16: 150 mg via INTRAVENOUS

## 2019-02-16 MED ORDER — PHENYLEPHRINE 40 MCG/ML (10ML) SYRINGE FOR IV PUSH (FOR BLOOD PRESSURE SUPPORT)
PREFILLED_SYRINGE | INTRAVENOUS | Status: DC | PRN
  Administered 2019-02-16: 80 ug via INTRAVENOUS
  Administered 2019-02-16: 120 ug via INTRAVENOUS
  Administered 2019-02-16: 40 ug via INTRAVENOUS
  Administered 2019-02-16 (×4): 80 ug via INTRAVENOUS
  Administered 2019-02-16: 120 ug via INTRAVENOUS
  Administered 2019-02-16 (×3): 80 ug via INTRAVENOUS

## 2019-02-16 MED ORDER — IODIXANOL 320 MG/ML IV SOLN
INTRAVENOUS | Status: DC | PRN
  Administered 2019-02-16: 18 mL via INTRA_ARTERIAL

## 2019-02-16 MED ORDER — LIDOCAINE 2% (20 MG/ML) 5 ML SYRINGE
INTRAMUSCULAR | Status: AC
  Filled 2019-02-16: qty 5

## 2019-02-16 MED ORDER — INSULIN ASPART 100 UNIT/ML ~~LOC~~ SOLN
SUBCUTANEOUS | Status: AC
  Filled 2019-02-16: qty 1

## 2019-02-16 MED ORDER — ONDANSETRON HCL 4 MG/2ML IJ SOLN: 4.0000 mg | Freq: Once | INTRAMUSCULAR | Status: DC | PRN

## 2019-02-16 MED ORDER — HEPARIN SODIUM (PORCINE) 1000 UNIT/ML IJ SOLN
INTRAMUSCULAR | Status: DC | PRN
  Administered 2019-02-16: 5000 [IU] via INTRAVENOUS
  Administered 2019-02-16: 8000 [IU] via INTRAVENOUS

## 2019-02-16 MED ORDER — SODIUM CHLORIDE 0.9 % IV SOLN
INTRAVENOUS | Status: AC
  Filled 2019-02-16: qty 1.2

## 2019-02-16 MED ORDER — LABETALOL HCL 5 MG/ML IV SOLN
INTRAVENOUS | Status: AC
  Filled 2019-02-16: qty 4

## 2019-02-16 MED ORDER — LACTATED RINGERS IV SOLN
INTRAVENOUS | Status: DC | PRN
  Administered 2019-02-16 (×2): via INTRAVENOUS

## 2019-02-16 MED ORDER — SODIUM CHLORIDE 0.9% IV SOLUTION: Freq: Once | INTRAVENOUS | Status: DC

## 2019-02-16 MED ORDER — SODIUM CHLORIDE 0.9 % IV SOLN
INTRAVENOUS | Status: DC | PRN
  Administered 2019-02-16: 500 mL

## 2019-02-16 MED ORDER — HEPARIN SODIUM (PORCINE) 1000 UNIT/ML IJ SOLN
INTRAMUSCULAR | Status: AC
  Filled 2019-02-16: qty 1

## 2019-02-16 MED ORDER — SUCCINYLCHOLINE CHLORIDE 20 MG/ML IJ SOLN
INTRAMUSCULAR | Status: DC | PRN
  Administered 2019-02-16: 100 mg via INTRAVENOUS

## 2019-02-16 MED ORDER — PHENYLEPHRINE HCL-NACL 10-0.9 MG/250ML-% IV SOLN
INTRAVENOUS | Status: DC | PRN
  Administered 2019-02-16: 20 ug/min via INTRAVENOUS

## 2019-02-16 MED ORDER — HYDROMORPHONE HCL 1 MG/ML IJ SOLN
0.5000 mg | INTRAMUSCULAR | Status: DC | PRN
  Administered 2019-02-16 – 2019-02-17 (×4): 1 mg via INTRAVENOUS
  Administered 2019-02-18: 0.5 mg via INTRAVENOUS
  Administered 2019-02-23 – 2019-02-27 (×8): 1 mg via INTRAVENOUS
  Filled 2019-02-16 (×13): qty 1

## 2019-02-16 MED ORDER — PROTAMINE SULFATE 10 MG/ML IV SOLN
INTRAVENOUS | Status: DC | PRN
  Administered 2019-02-16: 5 mg via INTRAVENOUS
  Administered 2019-02-16 (×2): 10 mg via INTRAVENOUS

## 2019-02-16 MED ORDER — ROCURONIUM BROMIDE 10 MG/ML (PF) SYRINGE
PREFILLED_SYRINGE | INTRAVENOUS | Status: AC
  Filled 2019-02-16: qty 10

## 2019-02-16 MED ORDER — HEPARIN BOLUS VIA INFUSION
2000.0000 [IU] | Freq: Once | INTRAVENOUS | Status: AC
  Administered 2019-02-16: 2000 [IU] via INTRAVENOUS
  Filled 2019-02-16: qty 2000

## 2019-02-16 MED ORDER — SUCCINYLCHOLINE CHLORIDE 200 MG/10ML IV SOSY
PREFILLED_SYRINGE | INTRAVENOUS | Status: AC
  Filled 2019-02-16: qty 10

## 2019-02-16 MED ORDER — HEPARIN (PORCINE) 25000 UT/250ML-% IV SOLN
900.0000 [IU]/h | INTRAVENOUS | Status: DC
  Administered 2019-02-16: 900 [IU]/h via INTRAVENOUS
  Filled 2019-02-16: qty 250

## 2019-02-16 MED ORDER — METOPROLOL TARTRATE 5 MG/5ML IV SOLN: 2.0000 mg | INTRAVENOUS | Status: DC | PRN

## 2019-02-16 MED ORDER — ONDANSETRON HCL 4 MG/2ML IJ SOLN
INTRAMUSCULAR | Status: AC
  Filled 2019-02-16: qty 2

## 2019-02-16 MED ORDER — ROCURONIUM BROMIDE 100 MG/10ML IV SOLN
INTRAVENOUS | Status: DC | PRN
  Administered 2019-02-16 (×2): 20 mg via INTRAVENOUS
  Administered 2019-02-16: 10 mg via INTRAVENOUS
  Administered 2019-02-16: 20 mg via INTRAVENOUS
  Administered 2019-02-16: 50 mg via INTRAVENOUS
  Administered 2019-02-16: 20 mg via INTRAVENOUS

## 2019-02-16 MED ORDER — DEXAMETHASONE SODIUM PHOSPHATE 10 MG/ML IJ SOLN
INTRAMUSCULAR | Status: DC | PRN
  Administered 2019-02-16: 4 mg via INTRAVENOUS

## 2019-02-16 MED ORDER — HEMOSTATIC AGENTS (NO CHARGE) OPTIME
TOPICAL | Status: DC | PRN
  Administered 2019-02-16: 2 via TOPICAL

## 2019-02-16 MED ORDER — FENTANYL CITRATE (PF) 100 MCG/2ML IJ SOLN
25.0000 ug | INTRAMUSCULAR | Status: DC | PRN
  Administered 2019-02-16: 25 ug via INTRAVENOUS
  Administered 2019-02-16 (×2): 50 ug via INTRAVENOUS
  Administered 2019-02-16: 25 ug via INTRAVENOUS

## 2019-02-16 MED ORDER — FENTANYL CITRATE (PF) 100 MCG/2ML IJ SOLN
INTRAMUSCULAR | Status: AC
  Filled 2019-02-16: qty 2

## 2019-02-16 MED ORDER — PHENYLEPHRINE 40 MCG/ML (10ML) SYRINGE FOR IV PUSH (FOR BLOOD PRESSURE SUPPORT)
PREFILLED_SYRINGE | INTRAVENOUS | Status: AC
  Filled 2019-02-16: qty 20

## 2019-02-16 MED ORDER — SODIUM CHLORIDE 0.9 % IV SOLN
INTRAVENOUS | Status: DC
  Administered 2019-02-16 – 2019-02-18 (×4): via INTRAVENOUS

## 2019-02-16 MED ORDER — PROPOFOL 10 MG/ML IV BOLUS
INTRAVENOUS | Status: AC
  Filled 2019-02-16: qty 20

## 2019-02-16 MED ORDER — CHLORHEXIDINE GLUCONATE CLOTH 2 % EX PADS
6.0000 | MEDICATED_PAD | Freq: Every day | CUTANEOUS | Status: DC
  Administered 2019-02-16 – 2019-03-02 (×14): 6 via TOPICAL

## 2019-02-16 MED ORDER — INSULIN ASPART 100 UNIT/ML ~~LOC~~ SOLN
0.0000 [IU] | SUBCUTANEOUS | Status: DC
  Administered 2019-02-16: 19:00:00 7 [IU] via SUBCUTANEOUS
  Administered 2019-02-17: 3 [IU] via SUBCUTANEOUS
  Administered 2019-02-17: 5 [IU] via SUBCUTANEOUS
  Administered 2019-02-17 (×2): 3 [IU] via SUBCUTANEOUS
  Administered 2019-02-17: 5 [IU] via SUBCUTANEOUS
  Administered 2019-02-17: 3 [IU] via SUBCUTANEOUS
  Administered 2019-02-18 (×6): 2 [IU] via SUBCUTANEOUS
  Administered 2019-02-18: 5 [IU] via SUBCUTANEOUS
  Administered 2019-02-19 (×2): 1 [IU] via SUBCUTANEOUS
  Administered 2019-02-20: 2 [IU] via SUBCUTANEOUS
  Administered 2019-02-20: 1 [IU] via SUBCUTANEOUS
  Administered 2019-02-20: 3 [IU] via SUBCUTANEOUS
  Administered 2019-02-20 – 2019-02-21 (×2): 1 [IU] via SUBCUTANEOUS
  Administered 2019-02-21: 2 [IU] via SUBCUTANEOUS
  Administered 2019-02-21: 1 [IU] via SUBCUTANEOUS
  Administered 2019-02-22: 2 [IU] via SUBCUTANEOUS
  Administered 2019-02-22: 3 [IU] via SUBCUTANEOUS
  Administered 2019-02-22: 2 [IU] via SUBCUTANEOUS
  Administered 2019-02-23 (×2): 1 [IU] via SUBCUTANEOUS

## 2019-02-16 MED ORDER — ALBUMIN HUMAN 5 % IV SOLN
INTRAVENOUS | Status: DC | PRN
  Administered 2019-02-16: 14:00:00 via INTRAVENOUS

## 2019-02-16 MED ORDER — LABETALOL HCL 5 MG/ML IV SOLN
10.0000 mg | INTRAVENOUS | Status: AC | PRN
  Administered 2019-02-16 – 2019-02-20 (×4): 10 mg via INTRAVENOUS
  Filled 2019-02-16 (×5): qty 4

## 2019-02-16 MED ORDER — HEPARIN SODIUM (PORCINE) 1000 UNIT/ML IJ SOLN
INTRAMUSCULAR | Status: AC
  Filled 2019-02-16: qty 2

## 2019-02-16 MED ORDER — ONDANSETRON HCL 4 MG/2ML IJ SOLN
INTRAMUSCULAR | Status: DC | PRN
  Administered 2019-02-16: 4 mg via INTRAVENOUS

## 2019-02-16 MED ORDER — FENTANYL CITRATE (PF) 250 MCG/5ML IJ SOLN
INTRAMUSCULAR | Status: DC | PRN
  Administered 2019-02-16 (×5): 50 ug via INTRAVENOUS

## 2019-02-16 MED ORDER — SODIUM CHLORIDE 0.9 % IV SOLN
INTRAVENOUS | Status: DC | PRN
  Administered 2019-02-16: 16:00:00 via INTRAVENOUS

## 2019-02-16 MED ORDER — LACTATED RINGERS IV SOLN
INTRAVENOUS | Status: DC | PRN
  Administered 2019-02-16: 13:00:00 via INTRAVENOUS

## 2019-02-16 MED ORDER — FENTANYL CITRATE (PF) 250 MCG/5ML IJ SOLN
INTRAMUSCULAR | Status: AC
  Filled 2019-02-16: qty 5

## 2019-02-16 MED ORDER — 0.9 % SODIUM CHLORIDE (POUR BTL) OPTIME
TOPICAL | Status: DC | PRN
  Administered 2019-02-16: 2000 mL

## 2019-02-16 MED ORDER — VANCOMYCIN HCL 1000 MG IV SOLR
INTRAVENOUS | Status: DC | PRN
  Administered 2019-02-16: 1000 mg via INTRAVENOUS

## 2019-02-16 MED ORDER — PROPOFOL 10 MG/ML IV BOLUS
INTRAVENOUS | Status: DC | PRN
  Administered 2019-02-16: 100 mg via INTRAVENOUS
  Administered 2019-02-16: 20 mg via INTRAVENOUS

## 2019-02-16 SURGICAL SUPPLY — 90 items
ADH SKN CLS APL DERMABOND .7 (GAUZE/BANDAGES/DRESSINGS) ×9
BANDAGE ESMARK 6X9 LF (GAUZE/BANDAGES/DRESSINGS) IMPLANT
BLADE AVERAGE 25MMX9MM (BLADE)
BLADE AVERAGE 25X9 (BLADE) IMPLANT
BLADE SAW SGTL 81X20 HD (BLADE) IMPLANT
BNDG CMPR 9X6 STRL LF SNTH (GAUZE/BANDAGES/DRESSINGS)
BNDG ELASTIC 4X5.8 VLCR STR LF (GAUZE/BANDAGES/DRESSINGS) ×5 IMPLANT
BNDG ESMARK 6X9 LF (GAUZE/BANDAGES/DRESSINGS)
BNDG GAUZE ELAST 4 BULKY (GAUZE/BANDAGES/DRESSINGS) ×7 IMPLANT
CANISTER SUCT 3000ML PPV (MISCELLANEOUS) ×7 IMPLANT
CANNULA VESSEL 3MM 2 BLNT TIP (CANNULA) ×2 IMPLANT
CATH EMB 2FR 60CM (CATHETERS) ×2 IMPLANT
CATH EMB 3FR 80CM (CATHETERS) ×2 IMPLANT
CLIP VESOCCLUDE MED 24/CT (CLIP) ×5 IMPLANT
CLIP VESOCCLUDE SM WIDE 24/CT (CLIP) ×5 IMPLANT
COVER PROBE W GEL 5X96 (DRAPES) ×2 IMPLANT
COVER SURGICAL LIGHT HANDLE (MISCELLANEOUS) ×5 IMPLANT
COVER WAND RF STERILE (DRAPES) ×3 IMPLANT
CUFF TOURN SGL QUICK 24 (TOURNIQUET CUFF)
CUFF TOURN SGL QUICK 34 (TOURNIQUET CUFF)
CUFF TOURN SGL QUICK 42 (TOURNIQUET CUFF) IMPLANT
CUFF TRNQT CYL 24X4X16.5-23 (TOURNIQUET CUFF) IMPLANT
CUFF TRNQT CYL 34X4.125X (TOURNIQUET CUFF) IMPLANT
DERMABOND ADVANCED (GAUZE/BANDAGES/DRESSINGS) ×6
DERMABOND ADVANCED .7 DNX12 (GAUZE/BANDAGES/DRESSINGS) ×3 IMPLANT
DRAIN CHANNEL 15F RND FF W/TCR (WOUND CARE) IMPLANT
DRAPE C-ARM 42X120 X-RAY (DRAPES) ×2 IMPLANT
DRAPE C-ARM 42X72 X-RAY (DRAPES) IMPLANT
DRAPE EXTREMITY T 121X128X90 (DISPOSABLE) ×3 IMPLANT
DRAPE HALF SHEET 40X57 (DRAPES) ×3 IMPLANT
DRAPE X-RAY CASS 24X20 (DRAPES) IMPLANT
DRSG COVADERM 4X6 (GAUZE/BANDAGES/DRESSINGS) ×8 IMPLANT
DRSG PAD ABDOMINAL 8X10 ST (GAUZE/BANDAGES/DRESSINGS) ×4 IMPLANT
ELECT CAUTERY BLADE 6.4 (BLADE) ×4 IMPLANT
ELECT REM PT RETURN 9FT ADLT (ELECTROSURGICAL) ×5
ELECTRODE REM PT RTRN 9FT ADLT (ELECTROSURGICAL) ×3 IMPLANT
EVACUATOR SILICONE 100CC (DRAIN) IMPLANT
GAUZE SPONGE 4X4 12PLY STRL (GAUZE/BANDAGES/DRESSINGS) ×9 IMPLANT
GLOVE BIO SURGEON STRL SZ 6.5 (GLOVE) ×1 IMPLANT
GLOVE BIO SURGEON STRL SZ7.5 (GLOVE) ×11 IMPLANT
GLOVE BIO SURGEONS STRL SZ 6.5 (GLOVE) ×1
GLOVE BIOGEL PI IND STRL 6.5 (GLOVE) IMPLANT
GLOVE BIOGEL PI INDICATOR 6.5 (GLOVE) ×4
GLOVE ECLIPSE 6.5 STRL STRAW (GLOVE) ×4 IMPLANT
GOWN STRL REUS W/ TWL LRG LVL3 (GOWN DISPOSABLE) ×6 IMPLANT
GOWN STRL REUS W/ TWL XL LVL3 (GOWN DISPOSABLE) ×3 IMPLANT
GOWN STRL REUS W/TWL LRG LVL3 (GOWN DISPOSABLE) ×15
GOWN STRL REUS W/TWL XL LVL3 (GOWN DISPOSABLE) ×10
HEMOSTAT SNOW SURGICEL 2X4 (HEMOSTASIS) ×6 IMPLANT
INSERT FOGARTY SM (MISCELLANEOUS) IMPLANT
KIT BASIN OR (CUSTOM PROCEDURE TRAY) ×5 IMPLANT
KIT TURNOVER KIT B (KITS) ×5 IMPLANT
LOOP VESSEL MAXI BLUE (MISCELLANEOUS) ×2 IMPLANT
MARKER GRAFT CORONARY BYPASS (MISCELLANEOUS) IMPLANT
NDL HYPO 25GX1X1/2 BEV (NEEDLE) IMPLANT
NEEDLE HYPO 25GX1X1/2 BEV (NEEDLE) IMPLANT
NS IRRIG 1000ML POUR BTL (IV SOLUTION) ×10 IMPLANT
PACK GENERAL/GYN (CUSTOM PROCEDURE TRAY) ×3 IMPLANT
PACK PERIPHERAL VASCULAR (CUSTOM PROCEDURE TRAY) ×5 IMPLANT
PAD ARMBOARD 7.5X6 YLW CONV (MISCELLANEOUS) ×10 IMPLANT
PENCIL BUTTON HOLSTER BLD 10FT (ELECTRODE) ×2 IMPLANT
SET COLLECT BLD 21X3/4 12 (NEEDLE) IMPLANT
SET COLLECT BLD 21X3/4 12 PB (MISCELLANEOUS) ×2 IMPLANT
SPONGE LAP 18X18 RF (DISPOSABLE) ×4 IMPLANT
STAPLER VISISTAT 35W (STAPLE) ×6 IMPLANT
STOPCOCK 4 WAY LG BORE MALE ST (IV SETS) ×2 IMPLANT
SUT ETHILON 3 0 PS 1 (SUTURE) ×5 IMPLANT
SUT MNCRL AB 4-0 PS2 18 (SUTURE) ×10 IMPLANT
SUT PROLENE 5 0 C 1 24 (SUTURE) ×14 IMPLANT
SUT PROLENE 6 0 BV (SUTURE) ×27 IMPLANT
SUT PROLENE 7 0 BV 1 (SUTURE) IMPLANT
SUT SILK 2 0 SH (SUTURE) ×5 IMPLANT
SUT SILK 3 0 (SUTURE) ×20
SUT SILK 3-0 18XBRD TIE 12 (SUTURE) IMPLANT
SUT VIC AB 2-0 CT1 27 (SUTURE) ×15
SUT VIC AB 2-0 CT1 36 (SUTURE) ×2 IMPLANT
SUT VIC AB 2-0 CT1 TAPERPNT 27 (SUTURE) ×6 IMPLANT
SUT VIC AB 3-0 SH 27 (SUTURE) ×15
SUT VIC AB 3-0 SH 27X BRD (SUTURE) ×6 IMPLANT
SYR 10ML LL (SYRINGE) ×2 IMPLANT
SYR 3ML LL SCALE MARK (SYRINGE) ×2 IMPLANT
SYR CONTROL 10ML LL (SYRINGE) IMPLANT
SYR TB 1ML LUER SLIP (SYRINGE) ×2 IMPLANT
TOWEL GREEN STERILE (TOWEL DISPOSABLE) ×10 IMPLANT
TRAY FOLEY MTR SLVR 16FR STAT (SET/KITS/TRAYS/PACK) ×3 IMPLANT
TUBE CONNECTING 12'X1/4 (SUCTIONS) ×2
TUBE CONNECTING 12X1/4 (SUCTIONS) ×2 IMPLANT
TUBING EXTENTION W/L.L. (IV SETS) ×2 IMPLANT
UNDERPAD 30X30 (UNDERPADS AND DIAPERS) ×5 IMPLANT
WATER STERILE IRR 1000ML POUR (IV SOLUTION) ×5 IMPLANT

## 2019-02-16 NOTE — Op Note (Signed)
Patient name: Debra Hopkins MRN: [id number] DOB: Nov 30, 1940 Sex: female  02/16/2019 Pre-operative Diagnosis: Acute on chronic right lower extremity ischemia Post-operative diagnosis:  Same Surgeon:  Eda Paschal. [name], MD Assistant: Annamarie Major, MD; Debra Slate, PA Procedure Performed: 1.  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  Right common femoral endarterectomy  3.  Harvest right greater saphenous vein  4.  Right TP trunk endarterectomy with vein patch angioplasty 5.  Right AT, PT and peroneal artery thromboembolectomy  6.  Right common femoral to TP trunk bypass with ipsilateral, translocated, nonreversed greater saphenous vein 7.  Right lower extremity angiogram  Indications: 78 year old female with acute on chronic right lower extremity ischemia.  The day prior to this procedure she underwent stenting of her right SFA and popliteal arteries.  She now has mottling of the right foot was started on heparin drip.  She has gangrenous changes to the right toes indicated for the above procedure with possible toe amputation.  Findings: Left common femoral pseudoaneurysm was primarily repaired with 5-0 Prolene suture.  The right Common femoral artery was heavily calcified endarterectomy was performed up into the external iliac artery and strong inflow was established.  The right greater saphenous vein was large suitable for bypass throughout its course.  Below the knee that was heavy thrombus burden in the popliteal artery into the TP trunk.  Endarterectomy was performed of the entire TP trunk completing at the bifurcation.  There was good backbleeding after thromboembolectomy of the peroneal and PT arteries.  I could not establish any backbleeding from the anterior tibial.  Vein patch angioplasty was performed.  We then performed bypass from the common femoral to the TP trunk vein patch with a nonreversed vein.  At completion angiogram demonstrated flow through the vein to the  peroneal artery is the dominant runoff and also the PT but these were quite diminutive there is minimal runoff into the foot.  There was a signal at the peroneal artery at the ankle.   Procedure:  The patient was identified in the holding area and taken to the operating room where she is placed upon operative when general anesthesia induced.  She was to the prepped in the left groin right lower extremity usual fashion antibiotics were administered timeout was called.  We began with vertical incision overlying the previous common femoral cannulation site in the left groin.  We dissected down through the soft tissue identified bleeding.  The artery was clamped proximally distally and repaired primarily with 5-0 Prolene suture.  We then released the clamps.  There was good signal proximally distally to our repair site.  This wound was then packed.  We turned our attention concomitantly Dr. Trula Slade and myself to the right groin and right below-knee incision.  Dr. Trula Slade made a vertical incision in the groin dissected down to the common femoral artery dissected out the SFA profunda and external leg arteries and placed Vesseloops around these.  In the same incision he identified this greater saphenous vein and traced via skip incisions to the knee.  Below the knee I dissected down took down the muscular attachments identified the popliteal vein and artery.  All vessels were noted to be externally thrombosed.  I placed a vessel loop around the popliteal artery.  I did have to divide one popliteal vein.  I placed Vesseloops around the anterior tibial dissected down through the tibioperoneal trunk and placed Vesseloops around the peroneal and posterior tibial arteries.  I then  harvested the vein down past this incision with a long vertical incision below the knee.  The vein was transected distally harvested up to the level of the saphenofemoral junction.  There side-biting clamp was placed it was transected and passed to  the back table for preparation.  The vein was then prepared on the back table by Debra Slate, PA.  I oversewed the saphenofemoral junction with 5-0 Prolene suture in a running mattress fashion.  This time the tunneler was passed from below the knee to the groin in a subfascial plane.  Patient was fully heparinized at this time.  I clamped the common femoral artery proximally followed by the profunda.  I opened this longitudinally.  There was acute appearing thrombus in the SFA.  I remove the stents that were obvious I could get out to of the previously placed Elluvia stents.  I attempted to pass a Fogarty but could not.  I did establish good backbleeding from the profunda.  I performed extensive endarterectomy up into the external.  Initially did not have hemostasis with clamping the common femoral and external iliac arteries but after endarterectomy I did have good hemostasis.  I then turned my attention to below the knee after reclamping all of our arteries in the groin.  Below the knee I opened the TP trunk longitudinally down the bifurcation.  I opened it up to the anterior tibial artery cephalad.  There was significant calcification and thrombus.  I passed the Fogarty down the anterior tibial artery could not get it to go more than 10 cm.  I performed extensive endarterectomy terminating at the tibioperoneal bifurcation.  I then passed a two followed by three Fogarty down the PT and peroneal arteries.  I was able to get a two Fogarty 50 cm down the PT and 30 cm down the peroneal.  I did establish good backbleeding.  I then took the saphenofemoral junction portion of the vein removed it from our vein graft.  I opened and spatulated to remove the first valve.  I then sewed this is a vein patch angioplasty on the TP trunk.  Upon completion I did not have much signal in there.  I then turned my attention to the groin.  I spatulated the vein and seven this into side to the common femoral artery with 6-0 Prolene  suture.  Prior completion allowed flushing all directions.  Upon completion had good pulsatility in the proximal vein graft.  I then lysed all the valves until I had pulsatile outflow.  This was marked for orientation and tunneled in the subfascial plane.  I then clamped the TP trunk proximally distally opened the vein patch longitudinally.  I straighten the leg and trimmed the vein graft to size.  I spatulated sewn end-to-side with 6-0 Prolene suture.  Prior completion allowed flushing all direction.  Upon completion I did very good signal in the vein graft as well as the PT and peroneal arteries proximally.  I could not find any signal at the ankle.  I elected to perform right lower extremity angiography.  I cannulated the bypass near the common femoral artery and performed a limited right lower extremity angiography which demonstrated flow via the peroneal and PT arteries although they were quite diseased and diminutive.  At this time I could find a minimal signal of the peroneal artery at the ankle.  The foot still appeared mottled however.  I reversed the heparinization with 25 mg of protamine.  I have diligently irrigated all  the wounds obtain hemostasis.  All wounds were closed with Vicryl in a deep layer.  Staples were placed at the level of the skin.  Patient was awake from anesthesia extubated in the operating room.  She was transferred to the PACU in stable but guarded condition.  All counts were correct at completion.  Contrast: 15 cc  EBL 1750 cc   Nirvana Blanchett C. [name], MD [facility] [place] Office: [phone] Pager: [phone]

## 2019-02-16 NOTE — Progress Notes (Signed)
Pt continuing to have elevated BP's. Dr. [name] made aware and orders were received. Will continue to monitor.

## 2019-02-16 NOTE — Progress Notes (Signed)
Pt had burst of SVT at 21:52. Strip saved

## 2019-02-16 NOTE — Anesthesia Preprocedure Evaluation (Addendum)
Anesthesia Evaluation  Patient identified by MRN, date of birth, ID band Patient awake    Reviewed: Allergy & Precautions, H&P , NPO status , Patient's Chart, lab work & pertinent test results, reviewed documented beta blocker date and time   Airway Mallampati: II  TM Distance: >3 FB     Dental  (+) Teeth Intact, Dental Advisory Given   Pulmonary shortness of breath, former smoker,  Quit smoking 1987   breath sounds clear to auscultation       Cardiovascular hypertension, Pt. on medications and Pt. on home beta blockers + CAD, + Peripheral Vascular Disease and +CHF   Rhythm:Regular Rate:Normal  Acute RLE ischemia, L fem pseudoaneurysm PVD- plavix; inpt on heparin gtt   Neuro/Psych Carotid artery stenosis TIAnegative psych ROS   GI/Hepatic GERD  Controlled and Medicated,Diverticulosis, hemorrhoids   Endo/Other  diabetes, Type 2, Oral Hypoglycemic Agents, Insulin DependentHypothyroidism   Renal/GU CRF and Renal InsufficiencyRenal diseaseCr 1.6- seems to be AKI on CKD  negative genitourinary   Musculoskeletal  (+) Arthritis , Osteoarthritis,  Fibromyalgia -DDD, fibromyalgia   Abdominal Normal abdominal exam  (+)   Peds  Hematology  (+) anemia , Starting Hb 7.1   Anesthesia Other Findings HLD  Reproductive/Obstetrics negative OB ROS                           Anesthesia Physical  Anesthesia Plan  ASA: III  Anesthesia Plan: General   Post-op Pain Management:    Induction: Intravenous, Cricoid pressure planned and Rapid sequence  PONV Risk Score and Plan: 2 and Ondansetron and Treatment may vary due to age or medical condition  Airway Management Planned: Oral ETT  Additional Equipment: Arterial line  Intra-op Plan:   Post-operative Plan: Extubation in OR and Possible Post-op intubation/ventilation  Informed Consent: I have reviewed the patients History and Physical, chart, labs and  discussed the procedure including the risks, benefits and alternatives for the proposed anesthesia with the patient or authorized representative who has indicated his/her understanding and acceptance.     Dental advisory given  Plan Discussed with: CRNA  Anesthesia Plan Comments: (From floor with only chest wall port for access. Will place at least one PIV and arterial line for monitoring. Will send type and screen and transfuse as needed, given already very low H/H. Possible postoperative ventilation depending on blood loss and intraoperative resuscitation. At baseline, patient is not oriented to person place or time. )       Anesthesia Quick Evaluation

## 2019-02-16 NOTE — Progress Notes (Signed)
  Progress Note    02/16/2019 10:04 AM 1 Day Post-Op  Subjective: Having left groin right lower extremity pain  Vitals:   02/16/19 0505 02/16/19 0930  BP: (!) 156/51 (!) 181/63  Pulse: 92   Resp: (!) 27   Temp: 97.7 F (36.5 C) 97.6 F (36.4 C)  SpO2: 100%     Physical Exam: She is awake and alert Left groin with pulsatile hematoma Right foot appears mottled there is no palpable popliteal pulse   CBC    Component Value Date/Time   WBC 2.1 (L) 02/16/2019 0336   RBC 2.32 (L) 02/16/2019 0336   HGB 7.2 (L) 02/16/2019 0336   HCT 22.4 (L) 02/16/2019 0336   PLT 241 02/16/2019 0336   MCV 96.6 02/16/2019 0336   MCH 31.0 02/16/2019 0336   MCHC 32.1 02/16/2019 0336   RDW 18.8 (H) 02/16/2019 0336   LYMPHSABS 1.6 11/17/2015 1228   MONOABS 0.6 11/17/2015 1228   EOSABS 0.1 11/17/2015 1228   BASOSABS 0.0 11/17/2015 1228    BMET    Component Value Date/Time   NA 145 02/16/2019 0336   NA 137 11/04/2018 1132   K 3.7 02/16/2019 0336   CL 113 (H) 02/16/2019 0336   CO2 15 (L) 02/16/2019 0336   GLUCOSE 218 (H) 02/16/2019 0336   BUN 32 (H) 02/16/2019 0336   BUN 24 11/04/2018 1132   CREATININE 1.56 (H) 02/16/2019 0336   CALCIUM 9.5 02/16/2019 0336   GFRNONAA 31 (L) 02/16/2019 0336   GFRAA 36 (L) 02/16/2019 0336    INR    Component Value Date/Time   INR 1.01 12/18/2016 1508     Intake/Output Summary (Last 24 hours) at 02/16/2019 1004 Last data filed at 02/16/2019 0510 Gross per 24 hour  Intake -  Output 875 ml  Net -875 ml     Assessment/plan:  78 y.o. female is s/p R SFA stenting now all stents occluded and has groin psa on the left. Will plan for bypass today right fem-pop with vein or graft with left femoral psa repair and right great toe amputation.  We will start heparin drip.  She is on aspirin and Plavix.  We will evaluate vein on the table.  Likely will require pseudoaneurysm repair to be open given sizable hematoma.   Nastasha Reising C. [name], MD [facility] [place] Office: [phone] Pager: [phone]  02/16/2019 10:04 AM

## 2019-02-16 NOTE — Anesthesia Procedure Notes (Addendum)
Procedure Name: Intubation Date/Time: 02/16/2019 1:41 PM Performed by: Janene Harvey, CRNA Pre-anesthesia Checklist: Patient identified, Emergency Drugs available, Suction available, Patient being monitored and Timeout performed Patient Re-evaluated:Patient Re-evaluated prior to induction Oxygen Delivery Method: Circle system utilized Preoxygenation: Pre-oxygenation with 100% oxygen Induction Type: IV induction and Rapid sequence Laryngoscope Size: Mac and 4 Grade View: Grade I Tube type: Oral Tube size: 7.0 mm Number of attempts: 1 Airway Equipment and Method: Stylet Placement Confirmation: ETT inserted through vocal cords under direct vision,  positive ETCO2 and breath sounds checked- equal and bilateral Secured at: 22 cm Tube secured with: Tape Dental Injury: Teeth and Oropharynx as per pre-operative assessment  Comments: DL x1 with MAC 4, 1 whole white pill noted on roof of patients mouth. Easily removed intact prior to ett placement. White (?crushed pill contents) noted around upper/lower gum line and posterior oral pharynx. Grade 1 view once suctioned, ett passed easily.

## 2019-02-16 NOTE — Anesthesia Postprocedure Evaluation (Signed)
Anesthesia Post Note  Patient: CASSEE THORNES  Procedure(s) Performed: RIGHT BYPASS GRAFT FEMORAL-POPLITEAL ARTERY USING NON-REVERSED RIGHT GREATER SAPHENOUS VEIN (Right ) REPAIR LEFT FEMORAL PSEUDOANEURYSM (Left ) Lower Extremity Angiogram (Right Leg Lower)     Patient location during evaluation: PACU Anesthesia Type: General Level of consciousness: awake Pain management: pain level controlled Vital Signs Assessment: post-procedure vital signs reviewed and stable Respiratory status: spontaneous breathing, nonlabored ventilation, respiratory function stable and patient connected to nasal cannula oxygen Cardiovascular status: blood pressure returned to baseline and stable Postop Assessment: no apparent nausea or vomiting Anesthetic complications: no    Last Vitals:  Vitals:   02/16/19 1957 02/16/19 2013  BP: (!) 142/60 (!) 138/51  Pulse: 71 74  Resp: 20 15  Temp:    SpO2: 98% 100%    Last Pain:  Vitals:   02/16/19 1930  TempSrc:   PainSc: (P) 0-No pain                 Catalina Gravel

## 2019-02-16 NOTE — Transfer of Care (Signed)
Immediate Anesthesia Transfer of Care Note  Patient: Debra Hopkins  Procedure(s) Performed: RIGHT BYPASS GRAFT FEMORAL-POPLITEAL ARTERY USING NON-REVERSED RIGHT GREATER SAPHENOUS VEIN (Right ) REPAIR LEFT FEMORAL PSEUDOANEURYSM (Left ) Lower Extremity Angiogram (Right Leg Lower)  Patient Location: PACU  Anesthesia Type:General  Level of Consciousness: awake  Airway & Oxygen Therapy: Patient Spontanous Breathing and Patient connected to face mask oxygen  Post-op Assessment: Report given to RN and Post -op Vital signs reviewed and stable  Post vital signs: Reviewed and stable  Last Vitals:  Vitals Value Taken Time  BP 125/95 02/16/19 1804  Temp    Pulse 67 02/16/19 1810  Resp 14 02/16/19 1810  SpO2 100 % 02/16/19 1810  Vitals shown include unvalidated device data.  Last Pain:  Vitals:   02/16/19 0930  TempSrc: Oral  PainSc: 9       Patients Stated Pain Goal: Other (Comment)(cannot provide answer) ([id number])  Complications: No apparent anesthesia complications

## 2019-02-16 NOTE — Anesthesia Procedure Notes (Signed)
Arterial Line Insertion Start/End12/10/2018 12:50 PM, 02/16/2019 1:00 PM Performed by: Pervis Hocking, DO, anesthesiologist  Patient location: OR. Preanesthetic checklist: patient identified, IV checked, site marked, risks and benefits discussed, surgical consent, monitors and equipment checked, pre-op evaluation, timeout performed and anesthesia consent Lidocaine 1% used for infiltration Left, radial was placed Catheter size: 20 G Hand hygiene performed  and maximum sterile barriers used   Attempts: 3 Procedure performed without using ultrasound guided technique. Following insertion, dressing applied. Post procedure assessment: normal and unchanged  Patient tolerated the procedure well with no immediate complications.

## 2019-02-16 NOTE — Progress Notes (Signed)
RLE arterial duplex and Left groin pseudo eval       have been completed. Preliminary results can be found under CV proc through chart review. June Leap, BS, RDMS, RVT  Gave report to Clair Gulling

## 2019-02-16 NOTE — Progress Notes (Signed)
Patient in severe pain at left femoral site hematoma and right leg throughout shift prior to going to OR. Medicated, Dr. [name] kept informed.

## 2019-02-16 NOTE — Progress Notes (Signed)
ANTICOAGULATION CONSULT NOTE - Initial Consult  Pharmacy Consult for IV heparin  Indication: ischemic limb  Allergies  Allergen Reactions  . Amoxicillin-Pot Clavulanate Hives and Other (See Comments)    Has patient had a PCN reaction causing immediate rash, facial/tongue/throat swelling, SOB or lightheadedness with hypotension: UNKNOWN Has patient had a PCN reaction causing severe rash involving mucus membranes or skin necrosis: UNKNOWN Has patient had a PCN reaction that required hospitalization No Has patient had a PCN reaction occurring within the last 10 years: No If all of the above answers are "NO", then may proceed with Cephalosporin use.   . Byetta 10 Mcg Pen [Exenatide] Other (See Comments)    Unknown  . Ceclor [Cefaclor] Other (See Comments)    Unknown  . Celexa [Citalopram Hydrobromide] Other (See Comments)    Sick  . Ciprofloxacin Nausea Only and Other (See Comments)    Dizziness  . Claritin [Loratadine] Other (See Comments)    Hair fell out  . Erythromycin Other (See Comments)    REACTION: Upset stomach  . Flexeril [Cyclobenzaprine] Other (See Comments)    Nervous  . Fosamax [Alendronate] Other (See Comments)    Made pt sick  . Jardiance [Empagliflozin] Other (See Comments)    Vaginal infection, UTI  . Motrin [Ibuprofen] Nausea Only  . Naproxen Other (See Comments)    REACTION: severe nausea  . Norvasc [Amlodipine Besylate] Other (See Comments)    Unknown  . Prednisone Other (See Comments)    REACTION: itchy rash  . Prozac [Fluoxetine Hcl] Other (See Comments)    Sick  . Sulfamethoxazole-Trimethoprim Nausea And Vomiting and Other (See Comments)    REACTION: Upset stomach    Patient Measurements: Height: 5\' 3"  (160 cm) Weight: 160 lb 15 oz (73 kg) IBW/kg (Calculated) : 52.4 Heparin Dosing Weight: 68.3 kg  Vital Signs: Temp: 97.6 F (36.4 C) (12/08 0930) Temp Source: Oral (12/08 0930) BP: 181/63 (12/08 0930) Pulse Rate: 92 (12/08  0505)  Labs: Recent Labs    02/15/19 1128 02/15/19 1900 02/16/19 0336  HGB 8.2* 7.7* 7.2*  HCT 24.0* 22.7* 22.4*  PLT  --  276 241  CREATININE 1.20* 1.49* 1.56*    Estimated Creatinine Clearance: 28 mL/min (A) (by C-G formula based on SCr of 1.56 mg/dL (H)).   Medical History: Past Medical History:  Diagnosis Date  . Arthritis   . CAD (coronary artery disease)   . Cancer ([place])   . Carotid artery occlusion   . Cataracts, bilateral   . DDD (degenerative disc disease)   . Diabetes mellitus    Type 2  . Diverticulitis   . Dyspnea   . Fibromyalgia   . GERD (gastroesophageal reflux disease)   . Heart murmur   . Hemorrhoids   . History of shingles   . Impaired hearing   . Macular degeneration   . Other and unspecified hyperlipidemia   . PVD (peripheral vascular disease) ([place])   . TIA (transient ischemic attack)   . Unspecified essential hypertension   . Varicose veins     Medications:  Infusions:  . sodium chloride    . sodium chloride    . heparin      Assessment: 78 yo female with ischemic limb.  Pharmacy asked to begin IV heparin.  Planning return to OR today.  Spoke with Dr. [name], no need to be less aggressive with heparin despite hematoma at groin site.  Received SQ heparin 5000 units this AM at 0700.  Goal of Therapy:  Heparin level 0.3-0.7 units/ml Monitor platelets by anticoagulation protocol: Yes   Plan:  1. Start IV heparin with small bolus of 2000 units x 1. 2. Then start heparin gtt at 900 units/hr. 3. Check heparin level in 8 hrs. 4. F/u plans for anticoagulation after fem-pop, toe amputation and pseudoaneurysm repair.  Marguerite Olea, [facility] Clinical Pharmacist Phone [phone]  02/16/2019 10:34 AM

## 2019-02-16 NOTE — Progress Notes (Signed)
Per Dr. Oneida Arenas we are not restarting the heparin gtt tonight.

## 2019-02-16 NOTE — Progress Notes (Signed)
Report received from [facility].

## 2019-02-17 ENCOUNTER — Encounter (HOSPITAL_COMMUNITY): Payer: Self-pay | Admitting: Vascular Surgery

## 2019-02-17 LAB — POCT I-STAT 7, (LYTES, BLD GAS, ICA,H+H)
Acid-base deficit: 4 mmol/L — ABNORMAL HIGH (ref 0.0–2.0)
Acid-base deficit: 7 mmol/L — ABNORMAL HIGH (ref 0.0–2.0)
Acid-base deficit: 7 mmol/L — ABNORMAL HIGH (ref 0.0–2.0)
Bicarbonate: 21 mmol/L (ref 20.0–28.0)
Bicarbonate: 18.5 mmol/L — ABNORMAL LOW (ref 20.0–28.0)
Bicarbonate: 18.6 mmol/L — ABNORMAL LOW (ref 20.0–28.0)
Calcium, Ion: 1.28 mmol/L (ref 1.15–1.40)
Calcium, Ion: 0.98 mmol/L — ABNORMAL LOW (ref 1.15–1.40)
Calcium, Ion: 1.16 mmol/L (ref 1.15–1.40)
HCT: 18 % — ABNORMAL LOW (ref 36.0–46.0)
HCT: 18 % — ABNORMAL LOW (ref 36.0–46.0)
HCT: 20 % — ABNORMAL LOW (ref 36.0–46.0)
Hemoglobin: 6.1 g/dL — CL (ref 12.0–15.0)
Hemoglobin: 6.1 g/dL — CL (ref 12.0–15.0)
Hemoglobin: 6.8 g/dL — CL (ref 12.0–15.0)
O2 Saturation: 100 %
O2 Saturation: 100 %
O2 Saturation: 100 %
Potassium: 3.8 mmol/L (ref 3.5–5.1)
Potassium: 4.6 mmol/L (ref 3.5–5.1)
Potassium: 5.1 mmol/L (ref 3.5–5.1)
Sodium: 145 mmol/L (ref 135–145)
Sodium: 142 mmol/L (ref 135–145)
Sodium: 143 mmol/L (ref 135–145)
TCO2: 22 mmol/L (ref 22–32)
TCO2: 20 mmol/L — ABNORMAL LOW (ref 22–32)
TCO2: 20 mmol/L — ABNORMAL LOW (ref 22–32)
pCO2 arterial: 36.3 mmHg (ref 32.0–48.0)
pCO2 arterial: 35.7 mmHg (ref 32.0–48.0)
pCO2 arterial: 37.3 mmHg (ref 32.0–48.0)
pH, Arterial: 7.371 (ref 7.350–7.450)
pH, Arterial: 7.306 — ABNORMAL LOW (ref 7.350–7.450)
pH, Arterial: 7.323 — ABNORMAL LOW (ref 7.350–7.450)
pO2, Arterial: 251 mmHg — ABNORMAL HIGH (ref 83.0–108.0)
pO2, Arterial: 205 mmHg — ABNORMAL HIGH (ref 83.0–108.0)
pO2, Arterial: 263 mmHg — ABNORMAL HIGH (ref 83.0–108.0)

## 2019-02-17 LAB — CBC
HCT: 30.2 % — ABNORMAL LOW (ref 36.0–46.0)
Hemoglobin: 10.4 g/dL — ABNORMAL LOW (ref 12.0–15.0)
MCH: 31.5 pg (ref 26.0–34.0)
MCHC: 34.4 g/dL (ref 30.0–36.0)
MCV: 91.5 fL (ref 80.0–100.0)
Platelets: 130 10*3/uL — ABNORMAL LOW (ref 150–400)
RBC: 3.3 MIL/uL — ABNORMAL LOW (ref 3.87–5.11)
RDW: 15 % (ref 11.5–15.5)
WBC: 3.2 10*3/uL — ABNORMAL LOW (ref 4.0–10.5)
nRBC: 1.6 % — ABNORMAL HIGH (ref 0.0–0.2)

## 2019-02-17 LAB — BASIC METABOLIC PANEL
Anion gap: 14 (ref 5–15)
BUN: 33 mg/dL — ABNORMAL HIGH (ref 8–23)
CO2: 17 mmol/L — ABNORMAL LOW (ref 22–32)
Calcium: 8 mg/dL — ABNORMAL LOW (ref 8.9–10.3)
Chloride: 116 mmol/L — ABNORMAL HIGH (ref 98–111)
Creatinine, Ser: 1.72 mg/dL — ABNORMAL HIGH (ref 0.44–1.00)
GFR calc Af Amer: 32 mL/min — ABNORMAL LOW (ref >60)
GFR calc non Af Amer: 28 mL/min — ABNORMAL LOW (ref >60)
Glucose, Bld: 209 mg/dL — ABNORMAL HIGH (ref 70–99)
Potassium: 4.1 mmol/L (ref 3.5–5.1)
Sodium: 147 mmol/L — ABNORMAL HIGH (ref 135–145)

## 2019-02-17 LAB — PREPARE FRESH FROZEN PLASMA
Unit division: 0
Unit division: 0

## 2019-02-17 LAB — HEMOGLOBIN A1C
Hgb A1c MFr Bld: 5.9 % — ABNORMAL HIGH (ref 4.8–5.6)
Mean Plasma Glucose: 122.63 mg/dL

## 2019-02-17 LAB — BPAM FFP
Blood Product Expiration Date: 202012132359
Blood Product Expiration Date: 202012132359
ISSUE DATE / TIME: 202012081643
ISSUE DATE / TIME: 202012081643
Unit Type and Rh: 6200
Unit Type and Rh: 6200

## 2019-02-17 LAB — URINALYSIS, ROUTINE W REFLEX MICROSCOPIC
Bilirubin Urine: NEGATIVE
Glucose, UA: NEGATIVE mg/dL
Ketones, ur: NEGATIVE mg/dL
Leukocytes,Ua: NEGATIVE
Nitrite: NEGATIVE
Protein, ur: 100 mg/dL — AB
Specific Gravity, Urine: 1.024 (ref 1.005–1.030)
pH: 5 (ref 5.0–8.0)

## 2019-02-17 LAB — GLUCOSE, CAPILLARY
Glucose-Capillary: 219 mg/dL — ABNORMAL HIGH (ref 70–99)
Glucose-Capillary: 219 mg/dL — ABNORMAL HIGH (ref 70–99)
Glucose-Capillary: 217 mg/dL — ABNORMAL HIGH (ref 70–99)
Glucose-Capillary: 232 mg/dL — ABNORMAL HIGH (ref 70–99)
Glucose-Capillary: 269 mg/dL — ABNORMAL HIGH (ref 70–99)
Glucose-Capillary: 280 mg/dL — ABNORMAL HIGH (ref 70–99)

## 2019-02-17 LAB — HEPARIN LEVEL (UNFRACTIONATED)
Heparin Unfractionated: <0.1 IU/mL — ABNORMAL LOW (ref 0.30–0.70)
Heparin Unfractionated: 0.18 IU/mL — ABNORMAL LOW (ref 0.30–0.70)

## 2019-02-17 LAB — MRSA PCR SCREENING: MRSA by PCR: NEGATIVE

## 2019-02-17 MED ORDER — ALUM & MAG HYDROXIDE-SIMETH 200-200-20 MG/5ML PO SUSP: 15.0000 mL | ORAL | Status: DC | PRN

## 2019-02-17 MED ORDER — SODIUM CHLORIDE 0.9 % IV SOLN: 500.0000 mL | Freq: Once | INTRAVENOUS | Status: DC | PRN

## 2019-02-17 MED ORDER — DOCUSATE SODIUM 100 MG PO CAPS
100.0000 mg | ORAL_CAPSULE | Freq: Every day | ORAL | Status: DC
  Administered 2019-02-17 – 2019-03-02 (×12): 100 mg via ORAL
  Filled 2019-02-17 (×13): qty 1

## 2019-02-17 MED ORDER — BISACODYL 10 MG RE SUPP
10.0000 mg | Freq: Every day | RECTAL | Status: DC | PRN
  Administered 2019-02-17: 10 mg via RECTAL
  Filled 2019-02-17: qty 1

## 2019-02-17 MED ORDER — HEPARIN (PORCINE) 25000 UT/250ML-% IV SOLN
2250.0000 [IU]/h | INTRAVENOUS | Status: DC
  Administered 2019-02-17: 1000 [IU]/h via INTRAVENOUS
  Administered 2019-02-18: 1350 [IU]/h via INTRAVENOUS
  Administered 2019-02-19: 1800 [IU]/h via INTRAVENOUS
  Administered 2019-02-20: 1950 [IU]/h via INTRAVENOUS
  Administered 2019-02-20: 2150 [IU]/h via INTRAVENOUS
  Administered 2019-02-21: 2250 [IU]/h via INTRAVENOUS
  Filled 2019-02-17 (×9): qty 250

## 2019-02-17 MED ORDER — LEVOTHYROXINE SODIUM 50 MCG PO TABS
50.0000 ug | ORAL_TABLET | Freq: Every day | ORAL | Status: DC
  Administered 2019-02-17 – 2019-03-02 (×14): 50 ug via ORAL
  Filled 2019-02-17 (×14): qty 1

## 2019-02-17 MED ORDER — POTASSIUM CHLORIDE CRYS ER 20 MEQ PO TBCR: 20.0000 meq | EXTENDED_RELEASE_TABLET | Freq: Every day | ORAL | Status: DC | PRN

## 2019-02-17 MED ORDER — HYDROMORPHONE HCL 1 MG/ML IJ SOLN
INTRAMUSCULAR | Status: AC
  Filled 2019-02-17: qty 1

## 2019-02-17 MED ORDER — INSULIN GLARGINE 100 UNIT/ML ~~LOC~~ SOLN
15.0000 [IU] | Freq: Every day | SUBCUTANEOUS | Status: DC
  Administered 2019-02-17: 15 [IU] via SUBCUTANEOUS
  Filled 2019-02-17 (×2): qty 0.15

## 2019-02-17 MED ORDER — SENNOSIDES-DOCUSATE SODIUM 8.6-50 MG PO TABS: 1.0000 | ORAL_TABLET | Freq: Every evening | ORAL | Status: DC | PRN

## 2019-02-17 MED ORDER — MAGNESIUM SULFATE 2 GM/50ML IV SOLN: 2.0000 g | Freq: Every day | INTRAVENOUS | Status: DC | PRN

## 2019-02-17 MED ORDER — ASPIRIN EC 81 MG PO TBEC: 81.0000 mg | DELAYED_RELEASE_TABLET | Freq: Every day | ORAL | Status: DC

## 2019-02-17 MED ORDER — SIMVASTATIN 20 MG PO TABS: 10.0000 mg | ORAL_TABLET | ORAL | Status: DC

## 2019-02-17 MED ORDER — ONDANSETRON HCL 4 MG/2ML IJ SOLN: 4.0000 mg | Freq: Four times a day (QID) | INTRAMUSCULAR | Status: DC | PRN

## 2019-02-17 MED ORDER — VANCOMYCIN HCL IN DEXTROSE 1-5 GM/200ML-% IV SOLN
1000.0000 mg | Freq: Once | INTRAVENOUS | Status: AC
  Administered 2019-02-17: 1000 mg via INTRAVENOUS
  Filled 2019-02-17: qty 200

## 2019-02-17 MED ORDER — HYDROMORPHONE HCL 1 MG/ML IJ SOLN
0.2500 mg | INTRAMUSCULAR | Status: DC | PRN
  Administered 2019-02-17: 0.25 mg via INTRAVENOUS

## 2019-02-17 MED ORDER — PHENOL 1.4 % MT LIQD: 1.0000 | OROMUCOSAL | Status: DC | PRN

## 2019-02-17 MED ORDER — GUAIFENESIN-DM 100-10 MG/5ML PO SYRP: 15.0000 mL | ORAL_SOLUTION | ORAL | Status: DC | PRN

## 2019-02-17 MED ORDER — PANTOPRAZOLE SODIUM 40 MG PO TBEC
40.0000 mg | DELAYED_RELEASE_TABLET | Freq: Every day | ORAL | Status: DC
  Administered 2019-02-17 – 2019-03-02 (×14): 40 mg via ORAL
  Filled 2019-02-17 (×14): qty 1

## 2019-02-17 NOTE — Progress Notes (Signed)
Patient's temperature is 101.9. She is not responding with voice. Her son says that the last time she acted like this was when she had a UTI. MD notified. Urinalysis and Culture collected. Will continue to monitor. Thayer Ohm D

## 2019-02-17 NOTE — Progress Notes (Signed)
ANTICOAGULATION CONSULT NOTE - Follow Up Consult  Pharmacy Consult for Heparin Indication: ischemic limb  Allergies  Allergen Reactions  . Amoxicillin-Pot Clavulanate Hives and Other (See Comments)    Has patient had a PCN reaction causing immediate rash, facial/tongue/throat swelling, SOB or lightheadedness with hypotension: UNKNOWN Has patient had a PCN reaction causing severe rash involving mucus membranes or skin necrosis: UNKNOWN Has patient had a PCN reaction that required hospitalization No Has patient had a PCN reaction occurring within the last 10 years: No If all of the above answers are "NO", then may proceed with Cephalosporin use.   . Byetta 10 Mcg Pen [Exenatide] Other (See Comments)    Unknown  . Ceclor [Cefaclor] Other (See Comments)    Unknown  . Celexa [Citalopram Hydrobromide] Other (See Comments)    Sick  . Ciprofloxacin Nausea Only and Other (See Comments)    Dizziness  . Claritin [Loratadine] Other (See Comments)    Hair fell out  . Erythromycin Other (See Comments)    REACTION: Upset stomach  . Flexeril [Cyclobenzaprine] Other (See Comments)    Nervous  . Fosamax [Alendronate] Other (See Comments)    Made pt sick  . Jardiance [Empagliflozin] Other (See Comments)    Vaginal infection, UTI  . Motrin [Ibuprofen] Nausea Only  . Naproxen Other (See Comments)    REACTION: severe nausea  . Norvasc [Amlodipine Besylate] Other (See Comments)    Unknown  . Prednisone Other (See Comments)    REACTION: itchy rash  . Prozac [Fluoxetine Hcl] Other (See Comments)    Sick  . Sulfamethoxazole-Trimethoprim Nausea And Vomiting and Other (See Comments)    REACTION: Upset stomach    Patient Measurements: Height: 5\' 3"  (160 cm) Weight: 160 lb 15 oz (73 kg) IBW/kg (Calculated) : 52.4 Heparin Dosing Weight:    Vital Signs: Temp: 98.9 F (37.2 C) (12/09 0400) Temp Source: Axillary (12/09 0400) BP: 161/56 (12/09 0800) Pulse Rate: 105 (12/09 0800)  Labs: Recent  Labs    02/15/19 1900 02/16/19 0336 02/16/19 1930 02/17/19 0125  HGB 7.7* 7.2*  --  10.4*  HCT 22.7* 22.4*  --  30.2*  PLT 276 241  --  130*  HEPARINUNFRC  --   --  <0.10*  --   CREATININE 1.49* 1.56*  --  1.72*    Estimated Creatinine Clearance: 25.4 mL/min (A) (by C-G formula based on SCr of 1.72 mg/dL (H)).  Assessment:  Anticoag: IV heparin for ischemic limb. Surgery 12/8 for R fem-pop BPG, L femoral pseudoaneurysm repair. Hgb 7.2>>transfused>>10.4. Plts 276>130? Noted sizeable hematoma in left groin pre-procedure. Improved this AM. Ok to resume heparin this AM per DR. Cain  Goal of Therapy:  Heparin level 0.3-0.7 units/ml Monitor platelets by anticoagulation protocol: Yes   Plan:  - IV heparin resume infusion this AM, no bolus per Dr. [name], at 1000 units/hr - Check heparin level in 6 hrs. - Daily HL and CBC. -Spoke to PA about resuming Lantus. Consult diabetic coordinator.   Cambri Plourde S. Alford Highland, PharmD, BCPS Clinical Staff Pharmacist Eilene Ghazi Stilling[place] 02/17/2019,8:21 AM

## 2019-02-17 NOTE — Evaluation (Signed)
Physical Therapy Evaluation Patient Details Name: Debra Hopkins MRN: [id number] DOB: April 10, 1940 Today's Date: 02/17/2019   History of Present Illness  pt is a 78 y/o female with PMH significant for CAD, CA, DDD, DM, macular degeneration, TIA and PVD admitted with acute on chrionic right LE ischemia.  The day prior to admission she underwent stenting of her right SFA and popliteal arteries and now has mottling of the right foot.  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  Clinical Impression  .Pt admitted with/for a painful, ischemic R LE.  Pt s/p revascularization.  Pt likely not at baseline mentally and with significant pain needs max to total assist for all mobility as of the evaluation..  Pt currently limited functionally due to the problems listed. ( See problems list.)   Pt will benefit from PT to maximize function and safety in order to get ready for next venue listed below.     Follow Up Recommendations CIR;Other (comment)(unless her mentation improves and she can go home with 24/7 )    Equipment Recommendations  Other (comment)(TBA)    Recommendations for Other Services Rehab consult     Precautions / Restrictions Precautions Precautions: Fall      Mobility  Bed Mobility Overal bed mobility: Needs Assistance Bed Mobility: Supine to Sit     Supine to sit: Total assist     General bed mobility comments: assisted bil LE to EOB and pt need total truncal assist to come forward, then pad assist to scoot to EOB  Transfers Overall transfer level: Needs assistance   Transfers: Squat Pivot Transfers;Sit to/from Stand Sit to Stand: Max assist;Total assist   Squat pivot transfers: Max assist;Total assist     General transfer comment: pt assisted by grabbing therapist and w/bearing on the L LE.  Cues for direction and significant pivotal assist.  Ambulation/Gait                 Stairs            Wheelchair Mobility    Modified Rankin (Stroke Patients Only)       Balance Overall balance assessment: Needs assistance Sitting-balance support: Single extremity supported;Bilateral upper extremity supported Sitting balance-Leahy Scale: Poor Sitting balance - Comments: needing external support for stability   Standing balance support: No upper extremity supported;Bilateral upper extremity supported Standing balance-Leahy Scale: Poor Standing balance comment: face to face assist to stand and tranfer.  pt reliant on external support and assist                             Pertinent Vitals/Pain Pain Assessment: Faces Faces Pain Scale: Hurts whole lot Pain Location: R LE pain Pain Descriptors / Indicators: Discomfort;Moaning;Guarding;Sore Pain Intervention(s): Monitored during session;Repositioned;Limited activity within patient's tolerance;RN gave pain meds during session    [place] expects to be discharged to:: Private residence Living Arrangements: Children;Other (Comment)(son) Available Help at Discharge: Family;Other (Comment) Type of Home: House           Additional Comments: Unable to reach son to get PLOF and enviromental questions answered    Prior Function                 Hand Dominance        Extremity/Trunk Assessment   Upper Extremity Assessment Upper Extremity Assessment: Defer to OT evaluation    Lower Extremity Assessment Lower Extremity  Assessment: RLE deficits/detail;LLE deficits/detail RLE Deficits / Details: very pain with touch or movement.  Pt with trace to minimal assist. RLE: Unable to fully assess due to pain RLE Coordination: decreased gross motor;decreased fine motor LLE Deficits / Details: painful to touch and move.  grossly 3/5 strength.  pt able to w/bear for transfer and standing for peri-care. LLE: Unable to fully assess due to pain LLE Coordination: decreased fine  motor;decreased gross motor       Communication   Communication: Other (comment)(little to no vocalization)  Cognition Arousal/Alertness: Awake/alert;Lethargic Behavior During Therapy: Flat affect;Restless;WFL for tasks assessed/performed Overall Cognitive Status: Difficult to assess                                 General Comments: pt only moaning through out session      General Comments General comments (skin integrity, edema, etc.): vss, pt's HR a little tachy.    Exercises Other Exercises Other Exercises: started with PROM exercise prior to mobility to decrease pain and stiffness.   Assessment/Plan    PT Assessment Patient needs continued PT services  PT Problem List Decreased strength;Decreased activity tolerance;Decreased mobility;Pain       PT Treatment Interventions Gait training;Functional mobility training;Therapeutic activities;Patient/family education;Balance training;DME instruction    PT Goals (Current goals can be found in the Care Plan section)  Acute Rehab PT Goals Patient Stated Goal: pt unable PT Goal Formulation: Patient unable to participate in goal setting Time For Goal Achievement: 03/03/19 Potential to Achieve Goals: Fair    Frequency Min 4X/week   Barriers to discharge        Co-evaluation               AM-PAC PT "6 Clicks" Mobility  Outcome Measure Help needed turning from your back to your side while in a flat bed without using bedrails?: Total Help needed moving from lying on your back to sitting on the side of a flat bed without using bedrails?: Total Help needed moving to and from a bed to a chair (including a wheelchair)?: Total Help needed standing up from a chair using your arms (e.g., wheelchair or bedside chair)?: Total Help needed to walk in hospital room?: Total Help needed climbing 3-5 steps with a railing? : Total 6 Click Score: 6    End of Session   Activity Tolerance: Patient limited by  pain Patient left: in chair;with call bell/phone within reach;with chair alarm set;Other (comment)(maxi sky lift) Nurse Communication: Mobility status PT Visit Diagnosis: Other abnormalities of gait and mobility (R26.89);Pain;Difficulty in walking, not elsewhere classified (R26.2) Pain - Right/Left: Right Pain - part of body: Leg    Time: 1010-1048 PT Time Calculation (min) (ACUTE ONLY): 38 min   Charges:   PT Evaluation $PT Eval Moderate Complexity: 1 Mod PT Treatments $Therapeutic Activity: 23-37 mins        02/17/2019  Donnella Sham, PT Acute Rehabilitation Services [phone]  (pager) [phone]  (office)  Tessie Fass Omare Bilotta 02/17/2019, 12:48 PM

## 2019-02-17 NOTE — Progress Notes (Signed)
Inpatient Diabetes Program Recommendations  AACE/ADA: New Consensus Statement on Inpatient Glycemic Control (2015)  Target Ranges:  Prepandial:   less than 140 mg/dL      Peak postprandial:   less than 180 mg/dL (1-2 hours)      Critically ill patients:  140 - 180 mg/dL   Lab Results  Component Value Date   GLUCAP 217 (H) 02/17/2019   HGBA1C 5.9 (H) 02/17/2019    Review of Glycemic Control  Diabetes history: DM 2 Outpatient Diabetes medications: Lantus 30 qhs Current orders for Inpatient glycemic control:  Novolog 0-9 units Q4 hours  A1c 5.9% on 12/9  Inpatient Diabetes Program Recommendations:    Decadron 4 mg given yesterday. Glucose trends in 200's. Called [facility], Vascular PA, received order for half of pt home dose of basal insulin to start this am. Will monitor glucose trends.  Thanks,  Tama Headings RN, MSN, BC-ADM Inpatient Diabetes Coordinator Team Pager [phone] (8a-5p)

## 2019-02-17 NOTE — Evaluation (Signed)
Occupational Therapy Evaluation Patient Details Name: Debra Hopkins MRN: [id number] DOB: 22-Oct-1940 Today's Date: 02/17/2019    History of Present Illness pt is a 78 y/o female with PMH significant for CAD, CA, DDD, DM, macular degeneration, TIA and PVD admitted with acute on chrionic right LE ischemia.  The day prior to admission she underwent stenting of her right SFA and popliteal arteries and now has mottling of the right foot.  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   Clinical Impression   Pt PTA: not known. Pt currently performing minimal mobility with OTR. Pt with little to no verbalizations. Pt made one "Mhmm" when asked her about tv channel. Pt limited by poor mobility, decreased ability to care for self and decreased strength. Pt not initiating any movement for sit to stand out of recliner x3 attempts. RLE pain with any movement. Pt requiring external support for stability; pt sitting unsupported in recliner x5 mins to initiate grooming. Pt would benefit from continued OT skilled services for ADL, mobility and safety in CIR setting. OT following.    Follow Up Recommendations  CIR    Equipment Recommendations  None recommended by OT    Recommendations for Other Services       Precautions / Restrictions Precautions Precautions: Fall Restrictions Weight Bearing Restrictions: No      Mobility Bed Mobility Overal bed mobility: Needs Assistance             General bed mobility comments: pt up in recliner upon arrival  Transfers Overall transfer level: Needs assistance               General transfer comment: Pt not initiating any movement for sit to stand out of recliner x3 attempts.     Balance Overall balance assessment: Needs assistance Sitting-balance support: Single extremity supported;Bilateral upper extremity supported Sitting balance-Leahy Scale: Poor Sitting balance  - Comments: needing external support for stability; pt sitting unsupported in recliner x5 mins to initiate grooming.                                   ADL either performed or assessed with clinical judgement   ADL Overall ADL's : Needs assistance/impaired Eating/Feeding: Minimal assistance;Sitting   Grooming: Moderate assistance;Sitting Grooming Details (indicate cue type and reason): pt cues with hand over hand technique for combing hair. Upper Body Bathing: Moderate assistance;Sitting   Lower Body Bathing: Maximal assistance;Total assistance;+2 for physical assistance;+2 for safety/equipment;Bed level   Upper Body Dressing : Moderate assistance;Sitting;Bed level   Lower Body Dressing: Maximal assistance;Total assistance;+2 for physical assistance;+2 for safety/equipment;Sitting/lateral leans;Bed level;Cueing for safety   Toilet Transfer: +2 for physical assistance;+2 for safety/equipment   Toileting- Clothing Manipulation and Hygiene: +2 for physical assistance;+2 for safety/equipment;Total assistance       Functional mobility during ADLs: Total assistance;+2 for physical assistance;+2 for safety/equipment;Cueing for safety General ADL Comments: Pt limited by poor mobility, decreased ability to care for self and decreased strength.     Vision Baseline Vision/History: Wears glasses Wears Glasses: Reading only Patient Visual Report: No change from baseline Vision Assessment?: Vision impaired- to be further tested in functional context Additional Comments: Pt able to scan therapist in room and follow from side to side.     Perception     Praxis      Pertinent Vitals/Pain Pain Assessment: Faces Faces Pain Scale: Hurts  whole lot Pain Location: R LE pain Pain Descriptors / Indicators: Discomfort;Moaning;Guarding;Sore Pain Intervention(s): Limited activity within patient's tolerance;Monitored during session     Hand Dominance     Extremity/Trunk Assessment  Upper Extremity Assessment Upper Extremity Assessment: Generalized weakness   Lower Extremity Assessment Lower Extremity Assessment: Generalized weakness;RLE deficits/detail RLE Deficits / Details: s/p sx; wrapped in ace wrap and nearly purple; hurts to touch   Cervical / Trunk Assessment Cervical / Trunk Assessment: Other exceptions Cervical / Trunk Exceptions: large body habitus   Communication Communication Communication: Other (comment)(Pt made one "Mhmm" when asked her about tv channel.)   Cognition Arousal/Alertness: Lethargic;Awake/alert Behavior During Therapy: Flat affect Overall Cognitive Status: Difficult to assess                                 General Comments: pt only moaning through out session   General Comments  VSS. pt's HR 105 BPM to 115 BPM.    Exercises     Shoulder Instructions      Home Living Family/patient expects to be discharged to:: Private residence Living Arrangements: Children;Other (Comment) Available Help at Discharge: Family;Other (Comment) Type of Home: House                           Additional Comments: Unable to reach son to get PLOF and enviromental questions answered      Prior Functioning/Environment          Comments: Unable to determine without family present        OT Problem List: Decreased strength;Decreased activity tolerance;Impaired balance (sitting and/or standing);Decreased safety awareness;Pain;Decreased coordination;Impaired UE functional use;Increased edema      OT Treatment/Interventions: Self-care/ADL training;Therapeutic exercise;Energy conservation;DME and/or AE instruction;Therapeutic activities;Cognitive remediation/compensation;Patient/family education;Balance training;Neuromuscular education    OT Goals(Current goals can be found in the care plan section) Acute Rehab OT Goals Patient Stated Goal: pt unable OT Goal Formulation: With patient Time For Goal Achievement:  03/03/19 Potential to Achieve Goals: Good ADL Goals Pt Will Perform Grooming: with min guard assist;standing Pt Will Perform Upper Body Dressing: with supervision;sitting Pt Will Perform Lower Body Dressing: with min guard assist;sitting/lateral leans;sit to/from stand Pt Will Transfer to Toilet: with min assist;stand pivot transfer Pt/caregiver will Perform Home Exercise Program: Increased strength;Both right and left upper extremity;With Supervision Additional ADL Goal #1: Pt will follow multi-step commands with 100% accuracy in 3/5 trials.  OT Frequency: Min 2X/week   Barriers to D/C:            Co-evaluation              AM-PAC OT "6 Clicks" Daily Activity     Outcome Measure Help from another person eating meals?: A Little Help from another person taking care of personal grooming?: A Lot Help from another person toileting, which includes using toliet, bedpan, or urinal?: Total Help from another person bathing (including washing, rinsing, drying)?: Total Help from another person to put on and taking off regular upper body clothing?: A Lot Help from another person to put on and taking off regular lower body clothing?: Total 6 Click Score: 10   End of Session Equipment Utilized During Treatment: Gait belt Nurse Communication: Mobility status  Activity Tolerance: Patient tolerated treatment well Patient left: in bed;with call bell/phone within reach;with bed alarm set  OT Visit Diagnosis: Unsteadiness on feet (R26.81);Muscle weakness (generalized) (M62.81)  Time: [id number] OT Time Calculation (min): 18 min Charges:  OT General Charges $OT Visit: 1 Visit OT Evaluation $OT Eval Moderate Complexity: 1 Mod  Darryl Nestle) Marsa Aris OTR/L Acute Rehabilitation Services Pager: [phone] Office: [place] 02/17/2019, 4:59 PM

## 2019-02-17 NOTE — Progress Notes (Signed)
ANTICOAGULATION CONSULT NOTE - Follow Up Consult  Pharmacy Consult for Heparin Indication: Ischemic limb  Allergies  Allergen Reactions  . Amoxicillin-Pot Clavulanate Hives and Other (See Comments)    Has patient had a PCN reaction causing immediate rash, facial/tongue/throat swelling, SOB or lightheadedness with hypotension: UNKNOWN Has patient had a PCN reaction causing severe rash involving mucus membranes or skin necrosis: UNKNOWN Has patient had a PCN reaction that required hospitalization No Has patient had a PCN reaction occurring within the last 10 years: No If all of the above answers are "NO", then may proceed with Cephalosporin use.   . Byetta 10 Mcg Pen [Exenatide] Other (See Comments)    Unknown  . Ceclor [Cefaclor] Other (See Comments)    Unknown  . Celexa [Citalopram Hydrobromide] Other (See Comments)    Sick  . Ciprofloxacin Nausea Only and Other (See Comments)    Dizziness  . Claritin [Loratadine] Other (See Comments)    Hair fell out  . Erythromycin Other (See Comments)    REACTION: Upset stomach  . Flexeril [Cyclobenzaprine] Other (See Comments)    Nervous  . Fosamax [Alendronate] Other (See Comments)    Made pt sick  . Jardiance [Empagliflozin] Other (See Comments)    Vaginal infection, UTI  . Motrin [Ibuprofen] Nausea Only  . Naproxen Other (See Comments)    REACTION: severe nausea  . Norvasc [Amlodipine Besylate] Other (See Comments)    Unknown  . Prednisone Other (See Comments)    REACTION: itchy rash  . Prozac [Fluoxetine Hcl] Other (See Comments)    Sick  . Sulfamethoxazole-Trimethoprim Nausea And Vomiting and Other (See Comments)    REACTION: Upset stomach    Patient Measurements: Height: 5\' 3"  (160 cm) Weight: 160 lb 15 oz (73 kg) IBW/kg (Calculated) : 52.4 Heparin Dosing Weight:    Vital Signs: Temp: 101.9 F (38.8 C) (12/09 1200) Temp Source: Oral (12/09 1200) BP: 122/60 (12/09 1400) Pulse Rate: 117 (12/09 1500)  Labs: Recent  Labs    02/15/19 1900 02/16/19 0336  02/16/19 1632 02/16/19 1724 02/16/19 1930 02/17/19 0125 02/17/19 1503  HGB 7.7* 7.2*   < > 6.1* 6.8*  --  10.4*  --   HCT 22.7* 22.4*   < > 18.0* 20.0*  --  30.2*  --   PLT 276 241  --   --   --   --  130*  --   HEPARINUNFRC  --   --   --   --   --  <0.10*  --  <0.10*  CREATININE 1.49* 1.56*  --   --   --   --  1.72*  --    < > = values in this interval not displayed.    Estimated Creatinine Clearance: 25.4 mL/min (A) (by C-G formula based on SCr of 1.72 mg/dL (H)).  Assessment:  Anticoag: IV heparin for ischemic limb. Surgery 12/8 for R fem-pop BPG, L femoral pseudoaneurysm repair. Hgb 7.2>>transfused>>10.4. Plts 276>130? Sizeable hematoma in left groin noted before procedure.  Improved this AM. Ok to resume heparin this AM per DR. [name]. Initial HL <0.1. (IV was out for very short period per RN)  Goal of Therapy:  Heparin level 0.3-0.7 units/ml Monitor platelets by anticoagulation protocol: Yes   Plan:  - Increase IV heparin to 1150 units/hr - Check heparin level in 6 hrs. Daily HL and CBC.  Con Arganbright S. Alford Highland, PharmD, [place] Clinical Staff Pharmacist Eilene Ghazi Stillinger 02/17/2019,4:31 PM

## 2019-02-17 NOTE — Progress Notes (Addendum)
  Progress Note    02/17/2019 8:18 AM 1 Day Post-Op  Subjective:  Shakes her head yes when asked if her foot hurts.  Afebrile HR 70's-110's NSR [id number] systolic [id number] RA  Vitals:   02/17/19 0700 02/17/19 0800  BP: (!) 159/45 (!) 161/56  Pulse: 97 (!) 105  Resp: 15 16  Temp:    SpO2: 95% 97%    Physical Exam: Cardiac:  regular Lungs:  Non labored on RA Incisions:  Left groin bandage clean and groin soft; right leg bandages clean.   Extremities:  +doppler signal right peroneal above the ankle.  Right foot warm, but but tenuous.     CBC    Component Value Date/Time   WBC 3.2 (L) 02/17/2019 0125   RBC 3.30 (L) 02/17/2019 0125   HGB 10.4 (L) 02/17/2019 0125   HCT 30.2 (L) 02/17/2019 0125   PLT 130 (L) 02/17/2019 0125   MCV 91.5 02/17/2019 0125   MCH 31.5 02/17/2019 0125   MCHC 34.4 02/17/2019 0125   RDW 15.0 02/17/2019 0125   LYMPHSABS 1.6 11/17/2015 1228   MONOABS 0.6 11/17/2015 1228   EOSABS 0.1 11/17/2015 1228   BASOSABS 0.0 11/17/2015 1228    BMET    Component Value Date/Time   NA 147 (H) 02/17/2019 0125   NA 137 11/04/2018 1132   K 4.1 02/17/2019 0125   CL 116 (H) 02/17/2019 0125   CO2 17 (L) 02/17/2019 0125   GLUCOSE 209 (H) 02/17/2019 0125   BUN 33 (H) 02/17/2019 0125   BUN 24 11/04/2018 1132   CREATININE 1.72 (H) 02/17/2019 0125   CALCIUM 8.0 (L) 02/17/2019 0125   GFRNONAA 28 (L) 02/17/2019 0125   GFRAA 32 (L) 02/17/2019 0125    INR    Component Value Date/Time   INR 1.01 12/18/2016 1508     Intake/Output Summary (Last 24 hours) at 02/17/2019 0818 Last data filed at 02/17/2019 0700 Gross per 24 hour  Intake 6712.93 ml  Output 2160 ml  Net 4552.93 ml     Assessment:  78 y.o. female is s/p:  1.  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  Right common femoral endarterectomy  3.  Harvest right greater saphenous vein  4.  Right TP trunk endarterectomy with vein patch angioplasty 5.  Right AT, PT and peroneal artery  thromboembolectomy  6.  Right common femoral to TP trunk bypass with ipsilateral, translocated, nonreversed greater saphenous vein 7.  Right lower extremity angiogram  1 Day Post-Op  Plan: -pt with right peroneal doppler signal above the ankle.  Right foot is warm.  Will allow right foot to demarcate.  -increase in creatinine today-will continue foley, keep IVF at 75cc/hr and check labs in am. -acute blood loss anemia improved with 6 units PRBC's and 2 FFP -diabtetes-pt on SSI-will ask the CM coordinator to help manage  -DVT prophylaxis:  Start heparin gtt with no bolus. -transfer to [place], [place] [facility] [phone] 02/17/2019 8:18 AM    I have interviewed and examined patient with PA and agree with assessment and plan above. Will start heparin drip.  Pailynn Vahey C. [name], MD [facility] [place] Office: [phone] Pager: [phone]

## 2019-02-17 NOTE — Progress Notes (Signed)
ANTICOAGULATION CONSULT NOTE - Follow Up Consult  Pharmacy Consult for Heparin Indication: Ischemic limb  Allergies  Allergen Reactions  . Amoxicillin-Pot Clavulanate Hives and Other (See Comments)    Has patient had a PCN reaction causing immediate rash, facial/tongue/throat swelling, SOB or lightheadedness with hypotension: UNKNOWN Has patient had a PCN reaction causing severe rash involving mucus membranes or skin necrosis: UNKNOWN Has patient had a PCN reaction that required hospitalization No Has patient had a PCN reaction occurring within the last 10 years: No If all of the above answers are "NO", then may proceed with Cephalosporin use.   . Byetta 10 Mcg Pen [Exenatide] Other (See Comments)    Unknown  . Ceclor [Cefaclor] Other (See Comments)    Unknown  . Celexa [Citalopram Hydrobromide] Other (See Comments)    Sick  . Ciprofloxacin Nausea Only and Other (See Comments)    Dizziness  . Claritin [Loratadine] Other (See Comments)    Hair fell out  . Erythromycin Other (See Comments)    REACTION: Upset stomach  . Flexeril [Cyclobenzaprine] Other (See Comments)    Nervous  . Fosamax [Alendronate] Other (See Comments)    Made pt sick  . Jardiance [Empagliflozin] Other (See Comments)    Vaginal infection, UTI  . Motrin [Ibuprofen] Nausea Only  . Naproxen Other (See Comments)    REACTION: severe nausea  . Norvasc [Amlodipine Besylate] Other (See Comments)    Unknown  . Prednisone Other (See Comments)    REACTION: itchy rash  . Prozac [Fluoxetine Hcl] Other (See Comments)    Sick  . Sulfamethoxazole-Trimethoprim Nausea And Vomiting and Other (See Comments)    REACTION: Upset stomach    Patient Measurements: Height: 5\' 3"  (160 cm) Weight: 160 lb 15 oz (73 kg) IBW/kg (Calculated) : 52.4 Heparin Dosing Weight:    Vital Signs: Temp: 98 F (36.7 C) (12/09 2012) Temp Source: Oral (12/09 2012) BP: 136/51 (12/09 2231) Pulse Rate: 103 (12/09 2231)  Labs: Recent Labs     02/15/19 1900 02/16/19 0336  02/16/19 1632 02/16/19 1724 02/16/19 1930 02/17/19 0125 02/17/19 1503 02/17/19 2313  HGB 7.7* 7.2*   < > 6.1* 6.8*  --  10.4*  --   --   HCT 22.7* 22.4*   < > 18.0* 20.0*  --  30.2*  --   --   PLT 276 241  --   --   --   --  130*  --   --   HEPARINUNFRC  --   --   --   --   --  <0.10*  --  <0.10* 0.18*  CREATININE 1.49* 1.56*  --   --   --   --  1.72*  --   --    < > = values in this interval not displayed.    Estimated Creatinine Clearance: 25.4 mL/min (A) (by C-G formula based on SCr of 1.72 mg/dL (H)).  Assessment:  Anticoag: IV heparin for ischemic limb. Surgery 12/8 for R fem-pop BPG, L femoral pseudoaneurysm repair. Hgb 7.2>>transfused>>10.4. Plts 276>130? Sizeable hematoma in left groin noted before procedure.  Improved this AM. Ok to resume heparin this AM per DR. [name]. Initial HL <0.1. (IV was out for very short period per RN)  Heparin level subtherapeutic at 0.18 s/p rate increase to 1150 units/hr, no infusion issues per RN.    Goal of Therapy:  Heparin level 0.3-0.7 units/ml Monitor platelets by anticoagulation protocol: Yes   Plan:  Increase heparin gtt to 1350 units/hr F/u  8 hour heparin level  Bertis Ruddy, PharmD Clinical Pharmacist Please check AMION for all [place] numbers 02/18/2019 12:00 AM

## 2019-02-18 ENCOUNTER — Inpatient Hospital Stay (HOSPITAL_COMMUNITY): Payer: Medicare Other

## 2019-02-18 LAB — CBC
HCT: 23.1 % — ABNORMAL LOW (ref 36.0–46.0)
Hemoglobin: 7.5 g/dL — ABNORMAL LOW (ref 12.0–15.0)
MCH: 31.1 pg (ref 26.0–34.0)
MCHC: 32.5 g/dL (ref 30.0–36.0)
MCV: 95.9 fL (ref 80.0–100.0)
Platelets: 139 10*3/uL — ABNORMAL LOW (ref 150–400)
RBC: 2.41 MIL/uL — ABNORMAL LOW (ref 3.87–5.11)
RDW: 16.3 % — ABNORMAL HIGH (ref 11.5–15.5)
WBC: 4.5 10*3/uL (ref 4.0–10.5)
nRBC: 0 % (ref 0.0–0.2)

## 2019-02-18 LAB — GLUCOSE, CAPILLARY
Glucose-Capillary: 154 mg/dL — ABNORMAL HIGH (ref 70–99)
Glucose-Capillary: 161 mg/dL — ABNORMAL HIGH (ref 70–99)
Glucose-Capillary: 167 mg/dL — ABNORMAL HIGH (ref 70–99)
Glucose-Capillary: 192 mg/dL — ABNORMAL HIGH (ref 70–99)
Glucose-Capillary: 198 mg/dL — ABNORMAL HIGH (ref 70–99)
Glucose-Capillary: 200 mg/dL — ABNORMAL HIGH (ref 70–99)
Glucose-Capillary: 258 mg/dL — ABNORMAL HIGH (ref 70–99)

## 2019-02-18 LAB — HEPARIN LEVEL (UNFRACTIONATED)
Heparin Unfractionated: 0.2 IU/mL — ABNORMAL LOW (ref 0.30–0.70)
Heparin Unfractionated: 0.17 IU/mL — ABNORMAL LOW (ref 0.30–0.70)

## 2019-02-18 LAB — BASIC METABOLIC PANEL
Anion gap: 9 (ref 5–15)
BUN: 44 mg/dL — ABNORMAL HIGH (ref 8–23)
CO2: 19 mmol/L — ABNORMAL LOW (ref 22–32)
Calcium: 8.3 mg/dL — ABNORMAL LOW (ref 8.9–10.3)
Chloride: 119 mmol/L — ABNORMAL HIGH (ref 98–111)
Creatinine, Ser: 2.11 mg/dL — ABNORMAL HIGH (ref 0.44–1.00)
GFR calc Af Amer: 25 mL/min — ABNORMAL LOW (ref >60)
GFR calc non Af Amer: 22 mL/min — ABNORMAL LOW (ref >60)
Glucose, Bld: 209 mg/dL — ABNORMAL HIGH (ref 70–99)
Potassium: 3.8 mmol/L (ref 3.5–5.1)
Sodium: 147 mmol/L — ABNORMAL HIGH (ref 135–145)

## 2019-02-18 LAB — URINE CULTURE: Culture: NO GROWTH

## 2019-02-18 MED ORDER — INSULIN GLARGINE 100 UNIT/ML ~~LOC~~ SOLN
15.0000 [IU] | Freq: Every day | SUBCUTANEOUS | Status: DC
  Administered 2019-02-18 – 2019-03-01 (×11): 15 [IU] via SUBCUTANEOUS
  Filled 2019-02-18 (×13): qty 0.15

## 2019-02-18 NOTE — Progress Notes (Addendum)
ANTICOAGULATION CONSULT NOTE - Follow Up Consult  Pharmacy Consult for Heparin Indication: ischemic limb  Allergies  Allergen Reactions  . Amoxicillin-Pot Clavulanate Hives and Other (See Comments)    Has patient had a PCN reaction causing immediate rash, facial/tongue/throat swelling, SOB or lightheadedness with hypotension: UNKNOWN Has patient had a PCN reaction causing severe rash involving mucus membranes or skin necrosis: UNKNOWN Has patient had a PCN reaction that required hospitalization No Has patient had a PCN reaction occurring within the last 10 years: No If all of the above answers are "NO", then may proceed with Cephalosporin use.   . Byetta 10 Mcg Pen [Exenatide] Other (See Comments)    Unknown  . Ceclor [Cefaclor] Other (See Comments)    Unknown  . Celexa [Citalopram Hydrobromide] Other (See Comments)    Sick  . Ciprofloxacin Nausea Only and Other (See Comments)    Dizziness  . Claritin [Loratadine] Other (See Comments)    Hair fell out  . Erythromycin Other (See Comments)    REACTION: Upset stomach  . Flexeril [Cyclobenzaprine] Other (See Comments)    Nervous  . Fosamax [Alendronate] Other (See Comments)    Made pt sick  . Jardiance [Empagliflozin] Other (See Comments)    Vaginal infection, UTI  . Motrin [Ibuprofen] Nausea Only  . Naproxen Other (See Comments)    REACTION: severe nausea  . Norvasc [Amlodipine Besylate] Other (See Comments)    Unknown  . Prednisone Other (See Comments)    REACTION: itchy rash  . Prozac [Fluoxetine Hcl] Other (See Comments)    Sick  . Sulfamethoxazole-Trimethoprim Nausea And Vomiting and Other (See Comments)    REACTION: Upset stomach    Patient Measurements: Height: 5\' 3"  (160 cm) Weight: 160 lb 15 oz (73 kg) IBW/kg (Calculated) : 52.4 Heparin Dosing Weight: 68 kg  Vital Signs: Temp: 99 F (37.2 C) (12/10 1229) Temp Source: Oral (12/10 1229) BP: 128/48 (12/10 1229) Pulse Rate: 90 (12/10 1229)  Labs: Recent  Labs    02/16/19 0336 02/16/19 1341 02/16/19 1724 02/17/19 0125 02/17/19 2313 02/18/19 0345 02/18/19 0747 02/18/19 1806  HGB 7.2*  --  6.8* 10.4*  --  7.5*  --   --   HCT 22.4*  --  20.0* 30.2*  --  23.1*  --   --   PLT 241  --   --  130*  --  139*  --   --   HEPARINUNFRC  --    < >  --   --  0.18*  --  0.20* 0.17*  CREATININE 1.56*  --   --  1.72*  --  2.11*  --   --    < > = values in this interval not displayed.    Estimated Creatinine Clearance: 20.7 mL/min (A) (by C-G formula based on SCr of 2.11 mg/dL (H)).  Assessment: 78 yr old female on IV heparin for ischemic limb. Surgery 12/8 for R fem-pop BPG, L femoral pseudoaneurysm repair. Hgb  7.2 on 12/8 >>transfused>>10.4> back down to 7.5 today. Platelets 275>>130>139.  Sizeable hematoma in left groin pre-procedure, prior RPh spoke with Dr. [name]. Ok to resume heparin on 12/9. Heparin levels have been low. RN reports no known bleeding or expansion of hematoma.    Plavix was stopped after 12/9 dose. On Aspirin 81 mg daily.   Heparin level ~6.5 hrs after heparin infusion was increased to 1450 units/hr was 0.17 units/ml, which is below the desired goal range. Per RN, no issues with IV or bleeding  observed; no hematoma expansion. Heparin is infusing through Port-A-Cath, per RN.  Goal of Therapy:  Heparin level 0.3-0.7 units/ml Monitor platelets by anticoagulation protocol: Yes   Plan:  Increase heparin to 1600 units/hr  Check heparin level ~8 hrs after increase Monitor daily heparin level and CBC Monitor for signs/symptoms of bleeding  Gillermina Hu, PharmD, BCPS, [facility] Clinical Pharmacist 02/18/2019,7:10 PM

## 2019-02-18 NOTE — Progress Notes (Addendum)
Inpatient Diabetes Program Recommendations  AACE/ADA: New Consensus Statement on Inpatient Glycemic Control   Target Ranges:  Prepandial:   less than 140 mg/dL      Peak postprandial:   less than 180 mg/dL (1-2 hours)      Critically ill patients:  140 - 180 mg/dL   Results for RYNN, BARRICK (MRN [id number]) as of 02/18/2019 13:30  Ref. Range 02/17/2019 07:35 02/17/2019 11:16 02/17/2019 15:41 02/17/2019 20:11 02/18/2019 00:09 02/18/2019 04:15 02/18/2019 08:34 02/18/2019 12:26  Glucose-Capillary Latest Ref Range: 70 - 99 mg/dL 217 (H) 232 (H) 269 (H) 280 (H) 258 (H) 192 (H) 154 (H) 198 (H)   Review of Glycemic Control  Diabetes history: DM2 Outpatient Diabetes medications: Lantus 30 units dialy Current orders for Inpatient glycemic control: Lantus 15 units daily, Novolog 0-9 units Q4H  Inpatient Diabetes Program Recommendations:   Insulin - Basal: Please consider increasing Lantus to 20 units daily.  Insulin-Correction: If patient is eating, please consider changing CBGs and Novolog correction to 0-9 units TID with meals and Novolog 0-5 units QHS.  NOTE: Noted patient received Decadron 4 mg x 1 on 02/17/19 which has contributed to hyperglycemia. Patient has been receiving Novolog correction Q4H.   Thanks, Barnie Alderman, RN, MSN, CDE Diabetes Coordinator Inpatient Diabetes Program [phone] (Team Pager from 8am to 5pm)

## 2019-02-18 NOTE — Progress Notes (Signed)
Late Entry due to Epic Downtime:  Dr. Donnetta Hutching, on-call for Dr. [name], paged regarding pt's cold mottled R foot @ approx. 0210. R Peroneal Pulse still faintly doplerable and unchanged from MD AM assessment per his note. However, since cold toes different than MD assessment ("Right foot warm"), MD notified. Page promptly returned, and order received to keep pt NPO until Day Team rounds. Will continue to monitor and assess.

## 2019-02-18 NOTE — Progress Notes (Signed)
Physical Therapy Treatment Patient Details Name: Debra Hopkins MRN: [id number] DOB: 1941/01/14 Today's Date: 02/18/2019    History of Present Illness pt is a 78 y/o female with PMH significant for CAD, CA, DDD, DM, macular degeneration, TIA and PVD admitted with acute on chrionic right LE ischemia.  The day prior to admission she underwent stenting of her right SFA and popliteal arteries and now has mottling of the right foot.  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    PT Comments    Pt limited by LE pain during session. Pt demonstrates improvement in bed mobility, transfers, and initiates stepping during gait. Pt with improve mentation during session although still remaining confused. Pt continues to demonstrate LE strength, balance, gait, and endurance deficit which place her at a high falls risk. Pt will continue to benefit from PT POC to reduce falls risk and caregiver burden.   Follow Up Recommendations  CIR;Other (comment)     Equipment Recommendations  (defer to post-acute setting)    Recommendations for Other Services       Precautions / Restrictions Precautions Precautions: Fall Restrictions Weight Bearing Restrictions: No    Mobility  Bed Mobility Overal bed mobility: Needs Assistance Bed Mobility: Supine to Sit     Supine to sit: Mod assist        Transfers Overall transfer level: Needs assistance Equipment used: Rolling walker (2 wheeled) Transfers: Sit to/from Stand Sit to Stand: Max assist;Mod assist         General transfer comment: maxA first trial and modA 2nd trial  Ambulation/Gait Ambulation/Gait assistance: Min assist Gait Distance (Feet): 3 Feet Assistive device: Rolling walker (2 wheeled) Gait Pattern/deviations: Shuffle Gait velocity: reduced Gait velocity interpretation: <1.31 ft/sec, indicative of household ambulator General Gait Details: short step  to shuffling gait to trun from bed to Dentist    Modified Rankin (Stroke Patients Only)       Balance Overall balance assessment: Needs assistance Sitting-balance support: Single extremity supported;Feet supported Sitting balance-Leahy Scale: Fair Sitting balance - Comments: minG   Standing balance support: Bilateral upper extremity supported Standing balance-Leahy Scale: Fair Standing balance comment: minA with BUE support                            Cognition Arousal/Alertness: Awake/alert Behavior During Therapy: WFL for tasks assessed/performed Overall Cognitive Status: Impaired/Different from baseline Area of Impairment: Memory;Problem solving                     Memory: Decreased recall of precautions;Decreased short-term memory       Problem Solving: Slow processing;Decreased initiation        Exercises General Exercises - Lower Extremity Ankle Circles/Pumps: AROM;Both;10 reps Gluteal Sets: AROM;Both;5 reps Long Arc Quad: AROM;Both;10 reps    General Comments General comments (skin integrity, edema, etc.): VSS      Pertinent Vitals/Pain Pain Assessment: Faces Faces Pain Scale: Hurts whole lot Pain Location: R LE pain Pain Descriptors / Indicators: Discomfort;Moaning;Guarding;Sore Pain Intervention(s): Limited activity within patient's tolerance    Home Living                      Prior Function            PT Goals (current goals can  now be found in the care plan section) Acute Rehab PT Goals Patient Stated Goal: improve mobility Progress towards PT goals: Progressing toward goals    Frequency    Min 3X/week      PT Plan Frequency needs to be updated    Co-evaluation              AM-PAC PT "6 Clicks" Mobility   Outcome Measure  Help needed turning from your back to your side while in a flat bed without using bedrails?: A Lot Help needed moving from  lying on your back to sitting on the side of a flat bed without using bedrails?: A Lot Help needed moving to and from a bed to a chair (including a wheelchair)?: A Lot Help needed standing up from a chair using your arms (e.g., wheelchair or bedside chair)?: A Lot Help needed to walk in hospital room?: A Lot Help needed climbing 3-5 steps with a railing? : Total 6 Click Score: 11    End of Session Equipment Utilized During Treatment: Gait belt Activity Tolerance: Patient limited by pain Patient left: in chair;with call bell/phone within reach;with chair alarm set Nurse Communication: Mobility status PT Visit Diagnosis: Other abnormalities of gait and mobility (R26.89);Pain;Difficulty in walking, not elsewhere classified (R26.2) Pain - Right/Left: Right Pain - part of body: Leg     Time: [id number] PT Time Calculation (min) (ACUTE ONLY): 23 min  Charges:  $Therapeutic Exercise: 8-22 mins $Therapeutic Activity: 8-22 mins                     Zenaida Niece, PT, DPT Acute Rehabilitation Pager: [phone]    Zenaida Niece 02/18/2019, 5:10 PM

## 2019-02-18 NOTE — Progress Notes (Signed)
ANTICOAGULATION CONSULT NOTE - Follow Up Consult  Pharmacy Consult for Heparin Indication: ischemic limb  Allergies  Allergen Reactions  . Amoxicillin-Pot Clavulanate Hives and Other (See Comments)    Has patient had a PCN reaction causing immediate rash, facial/tongue/throat swelling, SOB or lightheadedness with hypotension: UNKNOWN Has patient had a PCN reaction causing severe rash involving mucus membranes or skin necrosis: UNKNOWN Has patient had a PCN reaction that required hospitalization No Has patient had a PCN reaction occurring within the last 10 years: No If all of the above answers are "NO", then may proceed with Cephalosporin use.   . Byetta 10 Mcg Pen [Exenatide] Other (See Comments)    Unknown  . Ceclor [Cefaclor] Other (See Comments)    Unknown  . Celexa [Citalopram Hydrobromide] Other (See Comments)    Sick  . Ciprofloxacin Nausea Only and Other (See Comments)    Dizziness  . Claritin [Loratadine] Other (See Comments)    Hair fell out  . Erythromycin Other (See Comments)    REACTION: Upset stomach  . Flexeril [Cyclobenzaprine] Other (See Comments)    Nervous  . Fosamax [Alendronate] Other (See Comments)    Made pt sick  . Jardiance [Empagliflozin] Other (See Comments)    Vaginal infection, UTI  . Motrin [Ibuprofen] Nausea Only  . Naproxen Other (See Comments)    REACTION: severe nausea  . Norvasc [Amlodipine Besylate] Other (See Comments)    Unknown  . Prednisone Other (See Comments)    REACTION: itchy rash  . Prozac [Fluoxetine Hcl] Other (See Comments)    Sick  . Sulfamethoxazole-Trimethoprim Nausea And Vomiting and Other (See Comments)    REACTION: Upset stomach    Patient Measurements: Height: 5\' 3"  (160 cm) Weight: 160 lb 15 oz (73 kg) IBW/kg (Calculated) : 52.4 Heparin Dosing Weight: 68 kg  Vital Signs: Temp: 98.6 F (37 C) (12/10 0300) Temp Source: Oral (12/10 0300) BP: 150/47 (12/10 0832) Pulse Rate: 108 (12/10 0832)  Labs: Recent  Labs    02/16/19 0336 02/16/19 1341 02/16/19 1724 02/17/19 0125 02/17/19 1503 02/17/19 2313 02/18/19 0345 02/18/19 0747  HGB 7.2*  --  6.8* 10.4*  --   --  7.5*  --   HCT 22.4*  --  20.0* 30.2*  --   --  23.1*  --   PLT 241  --   --  130*  --   --  139*  --   HEPARINUNFRC  --    < >  --   --  <0.10* 0.18*  --  0.20*  CREATININE 1.56*  --   --  1.72*  --   --  2.11*  --    < > = values in this interval not displayed.    Estimated Creatinine Clearance: 20.7 mL/min (A) (by C-G formula based on SCr of 2.11 mg/dL (H)).  Assessment:  78 yr old female on IV heparin for ischemic limb. Surgery 12/8 for R fem-pop BPG, L femoral pseudoaneurysm repair. Hgb  7.2 on 12/8 >>transfused>>10.4> back down to 7.5 today. Platelets 275>>130>139.  Sizeable hematoma in left groin pre-procedure, prior Rx spoke with Dr. [name]. Ok to resume heparin on 12/9. Heparin levels have been low. RN reports no known bleeding or expansion of hematoma.    Heparin level remains subtherapeutic (0.20) on 1350 units/hr.  Plavix was stopped after 12/9 dose. On Aspirin 81 mg daily.   Goal of Therapy:  Heparin level 0.3-0.7 units/ml Monitor platelets by anticoagulation protocol: Yes   Plan:  Increase heparin  to 1450 units/hr Heparin level ~8 hrs after increase Daily heparin level and CBC. Monitor for any bleeding.  Arty Baumgartner, [place] Pager: [phone] or phone: [phone] 02/18/2019,10:07 AM

## 2019-02-18 NOTE — Progress Notes (Signed)
  Progress Note    02/18/2019 11:26 AM 2 Days Post-Op  Subjective:  Continues to have 1 word answers  Vitals:   02/18/19 0600 02/18/19 0832  BP: (!) 123/43 (!) 150/47  Pulse: 91 (!) 108  Resp:  15  Temp:    SpO2: 95% 99%    Physical Exam: Awake and alert Non labored respirations Left groin incision cdi with staples Right leg incisions cdi with staples Right ankle peroneal signal is multiphasic Right foot appears mottled is warm to the level of the toes she states that she has sensation does have minimal motor of the foot  CBC    Component Value Date/Time   WBC 4.5 02/18/2019 0345   RBC 2.41 (L) 02/18/2019 0345   HGB 7.5 (L) 02/18/2019 0345   HCT 23.1 (L) 02/18/2019 0345   PLT 139 (L) 02/18/2019 0345   MCV 95.9 02/18/2019 0345   MCH 31.1 02/18/2019 0345   MCHC 32.5 02/18/2019 0345   RDW 16.3 (H) 02/18/2019 0345   LYMPHSABS 1.6 11/17/2015 1228   MONOABS 0.6 11/17/2015 1228   EOSABS 0.1 11/17/2015 1228   BASOSABS 0.0 11/17/2015 1228    BMET    Component Value Date/Time   NA 147 (H) 02/18/2019 0345   NA 137 11/04/2018 1132   K 3.8 02/18/2019 0345   CL 119 (H) 02/18/2019 0345   CO2 19 (L) 02/18/2019 0345   GLUCOSE 209 (H) 02/18/2019 0345   BUN 44 (H) 02/18/2019 0345   BUN 24 11/04/2018 1132   CREATININE 2.11 (H) 02/18/2019 0345   CALCIUM 8.3 (L) 02/18/2019 0345   GFRNONAA 22 (L) 02/18/2019 0345   GFRAA 25 (L) 02/18/2019 0345    INR    Component Value Date/Time   INR 1.01 12/18/2016 1508     Intake/Output Summary (Last 24 hours) at 02/18/2019 1126 Last data filed at 02/18/2019 0600 Gross per 24 hour  Intake 1631.9 ml  Output 575 ml  Net 1056.9 ml     Assessment/plan:  78 y.o. female is s/p right lower extremity common femoral to tibioperoneal trunk bypass with patch angioplasty of the common femoral and tibioperoneal artery.  She appears to have good flow in the right ankle but foot continues to be mottled.  We are setting her up for CT scan  this morning given persistent will order answers all.  Will allow right foot to demarcate.  Remains high risk for below-knee amputation.  Debra Hopkins C. [name], MD [facility] [place] Office: [phone] Pager: [phone]  02/18/2019 11:26 AM

## 2019-02-19 LAB — PREPARE RBC (CROSSMATCH)

## 2019-02-19 LAB — CBC
HCT: 20.6 % — ABNORMAL LOW (ref 36.0–46.0)
Hemoglobin: 6.9 g/dL — CL (ref 12.0–15.0)
MCH: 32.1 pg (ref 26.0–34.0)
MCHC: 33.5 g/dL (ref 30.0–36.0)
MCV: 95.8 fL (ref 80.0–100.0)
Platelets: 139 10*3/uL — ABNORMAL LOW (ref 150–400)
RBC: 2.15 MIL/uL — ABNORMAL LOW (ref 3.87–5.11)
RDW: 15.9 % — ABNORMAL HIGH (ref 11.5–15.5)
WBC: 5 10*3/uL (ref 4.0–10.5)
nRBC: 0 % (ref 0.0–0.2)

## 2019-02-19 LAB — GLUCOSE, CAPILLARY
Glucose-Capillary: 122 mg/dL — ABNORMAL HIGH (ref 70–99)
Glucose-Capillary: 129 mg/dL — ABNORMAL HIGH (ref 70–99)
Glucose-Capillary: 141 mg/dL — ABNORMAL HIGH (ref 70–99)
Glucose-Capillary: 101 mg/dL — ABNORMAL HIGH (ref 70–99)

## 2019-02-19 LAB — HEPARIN LEVEL (UNFRACTIONATED)
Heparin Unfractionated: 0.15 IU/mL — ABNORMAL LOW (ref 0.30–0.70)
Heparin Unfractionated: 0.19 IU/mL — ABNORMAL LOW (ref 0.30–0.70)

## 2019-02-19 MED ORDER — SODIUM CHLORIDE 0.9% IV SOLUTION
Freq: Once | INTRAVENOUS | Status: AC
  Administered 2019-02-19: 11:00:00 via INTRAVENOUS

## 2019-02-19 MED ORDER — SODIUM CHLORIDE 0.9% IV SOLUTION: Freq: Once | INTRAVENOUS | Status: DC

## 2019-02-19 NOTE — Progress Notes (Signed)
Rehab Admissions Coordinator Note:  Patient was screened by Cleatrice Burke for appropriateness for an Inpatient Acute Rehab Consult per PT and OT recs. Noted high risk for amputation.    At this time, we are recommending Inpatient Rehab consult if pt would like to be considered for admit.  Cleatrice Burke RN MSN 02/19/2019, 1:40 PM  I can be reached at [phone].

## 2019-02-19 NOTE — Progress Notes (Signed)
Pt has become increasingly confused this evening, pt stating that she is in a "[employer]" and has been "kidnapped". Pt's son has been notified and tried to reorient pt, with no success. Pt refusing to let staff take care of her. Will notify MD and continue to monitor pt.

## 2019-02-19 NOTE — Progress Notes (Signed)
ANTICOAGULATION CONSULT NOTE - Follow Up Consult  Pharmacy Consult for Heparin Indication: ischemic limb  Allergies  Allergen Reactions  . Amoxicillin-Pot Clavulanate Hives and Other (See Comments)    Has patient had a PCN reaction causing immediate rash, facial/tongue/throat swelling, SOB or lightheadedness with hypotension: UNKNOWN Has patient had a PCN reaction causing severe rash involving mucus membranes or skin necrosis: UNKNOWN Has patient had a PCN reaction that required hospitalization No Has patient had a PCN reaction occurring within the last 10 years: No If all of the above answers are "NO", then may proceed with Cephalosporin use.   . Byetta 10 Mcg Pen [Exenatide] Other (See Comments)    Unknown  . Ceclor [Cefaclor] Other (See Comments)    Unknown  . Celexa [Citalopram Hydrobromide] Other (See Comments)    Sick  . Ciprofloxacin Nausea Only and Other (See Comments)    Dizziness  . Claritin [Loratadine] Other (See Comments)    Hair fell out  . Erythromycin Other (See Comments)    REACTION: Upset stomach  . Flexeril [Cyclobenzaprine] Other (See Comments)    Nervous  . Fosamax [Alendronate] Other (See Comments)    Made pt sick  . Jardiance [Empagliflozin] Other (See Comments)    Vaginal infection, UTI  . Motrin [Ibuprofen] Nausea Only  . Naproxen Other (See Comments)    REACTION: severe nausea  . Norvasc [Amlodipine Besylate] Other (See Comments)    Unknown  . Prednisone Other (See Comments)    REACTION: itchy rash  . Prozac [Fluoxetine Hcl] Other (See Comments)    Sick  . Sulfamethoxazole-Trimethoprim Nausea And Vomiting and Other (See Comments)    REACTION: Upset stomach    Patient Measurements: Height: 5\' 3"  (160 cm) Weight: 160 lb 15 oz (73 kg) IBW/kg (Calculated) : 52.4 Heparin Dosing Weight: 68 kg  Vital Signs: Temp: 97.9 F (36.6 C) (12/11 1445) Temp Source: Oral (12/11 1445) BP: 142/53 (12/11 1445) Pulse Rate: 86 (12/11 1445)  Labs: Recent  Labs    02/17/19 0125 02/18/19 0345 02/18/19 1806 02/19/19 0311 02/19/19 1828  HGB 10.4* 7.5*  --  6.9*  --   HCT 30.2* 23.1*  --  20.6*  --   PLT 130* 139*  --  139*  --   HEPARINUNFRC  --   --  0.17* 0.15* 0.19*  CREATININE 1.72* 2.11*  --   --   --     Estimated Creatinine Clearance: 20.7 mL/min (A) (by C-G formula based on SCr of 2.11 mg/dL (H)).  Assessment: 78 yr old female on IV heparin for ischemic limb. Surgery 12/8 for R fem-pop BPG, L femoral pseudoaneurysm repair.  Heparin levels have been low, level this evening delayed due to patient receiving blood for hgb of 6.9 this am. Per nursing noted, she is confused but no mention of overt bleeding.   Goal of Therapy:  Heparin level 0.3-0.7 units/ml Monitor platelets by anticoagulation protocol: Yes   Plan:  Increase heparin gtt to 1950 units/hr F/u 8 hour heparin level  Erin Hearing PharmD., BCPS Clinical Pharmacist 02/19/2019 7:11 PM

## 2019-02-19 NOTE — Care Management Important Message (Signed)
Important Message  Patient Details  Name: Debra Hopkins MRN: [id number] Date of Birth: 27-Apr-1940   Medicare Important Message Given:  Yes     Orbie Pyo 02/19/2019, 2:21 PM

## 2019-02-19 NOTE — Progress Notes (Signed)
Occupational Therapy Treatment Patient Details Name: Debra Hopkins MRN: [place]:[id number] DOB: 1940/10/10 Today's Date: 02/19/2019    History of present illness pt is a 78 y/o female with PMH significant for CAD, CA, DDD, DM, macular degeneration, TIA and PVD admitted with acute on chrionic right LE ischemia.  The day prior to admission she underwent stenting of her right SFA and popliteal arteries and now has mottling of the right foot.  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   OT comments  Pt making steady progress towards OT goals this session. Session focus on functional mobility as precursor to higher level ADLs. Pt continues to be limited by pain, decreased activity tolerance and decreased ability to care for self. Pt completes sit>stand from EOB in prep for functional transfer with MAX A from elevated surface with RW. Once pt standing, pt reports "I'm done for the day" and begins to sit down. Assisted pt back to supine with MOD A. DC plan currently remains appropriate, will continue to follow acutely per POC and update DC recs as needed.    Follow Up Recommendations  CIR    Equipment Recommendations  None recommended by OT    Recommendations for Other Services      Precautions / Restrictions Precautions Precautions: Fall Restrictions Weight Bearing Restrictions: No       Mobility Bed Mobility Overal bed mobility: Needs Assistance Bed Mobility: Supine to Sit     Supine to sit: Mod assist;HOB elevated     General bed mobility comments: pt requires increased time and effort but able to assist with maneuvering RLE towards EOB, assist to scoot hips forward and elevate trunk  Transfers Overall transfer level: Needs assistance Equipment used: Rolling walker (2 wheeled)   Sit to Stand: Max assist;From elevated surface         General transfer comment: MAX A sit>stand from elevated surface  with RW. once standing pt reports she needs to sit down stating "i'm done for the day"    Balance Overall balance assessment: Needs assistance Sitting-balance support: Single extremity supported;Feet supported Sitting balance-Leahy Scale: Fair Sitting balance - Comments: minG   Standing balance support: Bilateral upper extremity supported Standing balance-Leahy Scale: Poor Standing balance comment: reliant on BUE support and external assist                           ADL either performed or assessed with clinical judgement   ADL Overall ADL's : Needs assistance/impaired                     Lower Body Dressing: Maximal assistance;Total assistance;Bed level   Toilet Transfer: RW;Maximal assistance Toilet Transfer Details (indicate cue type and reason): attempted with MAX A for sit>stand. once pt standing pt reporting, "I'm done for today"         Functional mobility during ADLs: Maximal assistance;Rolling walker(sit>stand only) General ADL Comments: Pt limited by poor mobility, decreased ability to care for self and decreased strength. session focus on bed mobility and functional sit>stand with RW from EOB     Vision Baseline Vision/History: Wears glasses Wears Glasses: Reading only Patient Visual Report: No change from baseline     Perception     Praxis      Cognition Arousal/Alertness: Awake/alert Behavior During Therapy: WFL for tasks assessed/performed Overall Cognitive Status: Impaired/Different from baseline Area of Impairment: Orientation;Attention;Memory;Problem solving  Orientation Level: Disoriented to;Time Current Attention Level: Sustained Memory: Decreased recall of precautions;Decreased short-term memory       Problem Solving: Slow processing;Decreased initiation General Comments: pt disoriented to day of week and date. Slow to initiate movement and conversing minimally with therapist until stating "I'm done for  the day"        Exercises     Shoulder Instructions       General Comments vss    Pertinent Vitals/ Pain       Pain Assessment: 0-10 Pain Score: 8  Pain Location: R LE pain Pain Descriptors / Indicators: Discomfort;Moaning;Guarding;Sore Pain Intervention(s): Limited activity within patient's tolerance;Monitored during session;Repositioned  Home Living                                          Prior Functioning/Environment              Frequency  Min 2X/week        Progress Toward Goals  OT Goals(current goals can now be found in the care plan section)  Progress towards OT goals: Progressing toward goals  Acute Rehab OT Goals Patient Stated Goal: improve mobility OT Goal Formulation: With patient Time For Goal Achievement: 03/03/19 Potential to Achieve Goals: Good  Plan Discharge plan remains appropriate    Co-evaluation                 AM-PAC OT "6 Clicks" Daily Activity     Outcome Measure   Help from another person eating meals?: A Little Help from another person taking care of personal grooming?: A Lot Help from another person toileting, which includes using toliet, bedpan, or urinal?: Total Help from another person bathing (including washing, rinsing, drying)?: Total Help from another person to put on and taking off regular upper body clothing?: A Lot Help from another person to put on and taking off regular lower body clothing?: Total 6 Click Score: 10    End of Session Equipment Utilized During Treatment: Gait belt;Rolling walker  OT Visit Diagnosis: Unsteadiness on feet (R26.81);Muscle weakness (generalized) (M62.81)   Activity Tolerance Patient tolerated treatment well   Patient Left in bed;with call bell/phone within reach;with bed alarm set   Nurse Communication          Time: [id number] OT Time Calculation (min): 22 min  Charges: OT General Charges $OT Visit: 1 Visit OT Treatments $Therapeutic Activity:  8-22 mins  Lanier Clam., COTA/L Acute Rehabilitation Services [phone]    Ihor Gully 02/19/2019, 2:02 PM

## 2019-02-19 NOTE — Progress Notes (Signed)
Patient continues to refuse care from any staff member. B/P and oxygen sats were able to be gotten with some coaxing. Patient still thinks that she is in the restaurant and is trying to go to a 'real hospital". Patient was told that she was at [facility] but still believes that she is in a restaurant. Patient still refuses any care or attempt of care from staff. No CBGs, vitals or meds. Patient thinks that we are trying to hurt her. Will continue to monitor.

## 2019-02-19 NOTE — Progress Notes (Signed)
CRITICAL VALUE ALERT  Critical Value:  Hgb 6.9  Date & Time Notied:  02/19/19 @0430   Provider Notified: Dr. Carlis Abbott on-call for Dr. [name]  Orders Received/Actions taken: 1 Unit PRBCs ordered to transfuse. Confirmed okay to continue Heparin gtt.

## 2019-02-19 NOTE — Progress Notes (Signed)
ANTICOAGULATION CONSULT NOTE - Follow Up Consult  Pharmacy Consult for Heparin Indication: ischemic limb  Allergies  Allergen Reactions  . Amoxicillin-Pot Clavulanate Hives and Other (See Comments)    Has patient had a PCN reaction causing immediate rash, facial/tongue/throat swelling, SOB or lightheadedness with hypotension: UNKNOWN Has patient had a PCN reaction causing severe rash involving mucus membranes or skin necrosis: UNKNOWN Has patient had a PCN reaction that required hospitalization No Has patient had a PCN reaction occurring within the last 10 years: No If all of the above answers are "NO", then may proceed with Cephalosporin use.   . Byetta 10 Mcg Pen [Exenatide] Other (See Comments)    Unknown  . Ceclor [Cefaclor] Other (See Comments)    Unknown  . Celexa [Citalopram Hydrobromide] Other (See Comments)    Sick  . Ciprofloxacin Nausea Only and Other (See Comments)    Dizziness  . Claritin [Loratadine] Other (See Comments)    Hair fell out  . Erythromycin Other (See Comments)    REACTION: Upset stomach  . Flexeril [Cyclobenzaprine] Other (See Comments)    Nervous  . Fosamax [Alendronate] Other (See Comments)    Made pt sick  . Jardiance [Empagliflozin] Other (See Comments)    Vaginal infection, UTI  . Motrin [Ibuprofen] Nausea Only  . Naproxen Other (See Comments)    REACTION: severe nausea  . Norvasc [Amlodipine Besylate] Other (See Comments)    Unknown  . Prednisone Other (See Comments)    REACTION: itchy rash  . Prozac [Fluoxetine Hcl] Other (See Comments)    Sick  . Sulfamethoxazole-Trimethoprim Nausea And Vomiting and Other (See Comments)    REACTION: Upset stomach    Patient Measurements: Height: 5\' 3"  (160 cm) Weight: 160 lb 15 oz (73 kg) IBW/kg (Calculated) : 52.4 Heparin Dosing Weight: 68 kg  Vital Signs: Temp: 98.4 F (36.9 C) (12/11 0319) Temp Source: Oral (12/10 2337) BP: 152/57 (12/11 0319) Pulse Rate: 92 (12/11 0319)  Labs: Recent  Labs    02/17/19 0125 02/18/19 0345 02/18/19 0747 02/18/19 1806 02/19/19 0311  HGB 10.4* 7.5*  --   --  6.9*  HCT 30.2* 23.1*  --   --  20.6*  PLT 130* 139*  --   --  139*  HEPARINUNFRC  --   --  0.20* 0.17* 0.15*  CREATININE 1.72* 2.11*  --   --   --     Estimated Creatinine Clearance: 20.7 mL/min (A) (by C-G formula based on SCr of 2.11 mg/dL (H)).  Assessment: 78 yr old female on IV heparin for ischemic limb. Surgery 12/8 for R fem-pop BPG, L femoral pseudoaneurysm repair. Hgb  7.2 on 12/8 >>transfused>>10.4> back down to 7.5 today. Platelets 275>>130>139.  Sizeable hematoma in left groin pre-procedure, prior RPh spoke with Dr. [name]. Ok to resume heparin on 12/9. Heparin levels have been low. RN reports no known bleeding or expansion of hematoma.    Plavix was stopped after 12/9 dose. On Aspirin 81 mg daily.   Heparin level subtherapeutic at 0.15 s/p rate increase to 1600 units/hr.  No infusion issues per RN.    Goal of Therapy:  Heparin level 0.3-0.7 units/ml Monitor platelets by anticoagulation protocol: Yes   Plan:  Increase heparin gtt to 1800 units/hr F/u 8 hour heparin level  Bertis Ruddy, PharmD Clinical Pharmacist Please check AMION for all [place] numbers 02/19/2019 4:19 AM

## 2019-02-19 NOTE — Progress Notes (Addendum)
  Progress Note    02/19/2019 7:43 AM 3 Days Post-Op  Subjective:  No complaints  Afebrile HR 80's-120's NSR/ST [id number] systolic [id number] RA  Vitals:   02/19/19 0553 02/19/19 0645  BP: (!) 154/51 (!) 172/55  Pulse: 89 87  Resp: 19 16  Temp: 98.4 F (36.9 C) 98.9 F (37.2 C)  SpO2: 98% 98%    Physical Exam: Cardiac:  regular Lungs:  Non labored Incisions:  Clean and dry Extremities:  Right foot is mottled.  Minimal movement of toes.  +doppler signal right AT/peroneal Neuro:  More alert today  CBC    Component Value Date/Time   WBC 5.0 02/19/2019 0311   RBC 2.15 (L) 02/19/2019 0311   HGB 6.9 (LL) 02/19/2019 0311   HCT 20.6 (L) 02/19/2019 0311   PLT 139 (L) 02/19/2019 0311   MCV 95.8 02/19/2019 0311   MCH 32.1 02/19/2019 0311   MCHC 33.5 02/19/2019 0311   RDW 15.9 (H) 02/19/2019 0311   LYMPHSABS 1.6 11/17/2015 1228   MONOABS 0.6 11/17/2015 1228   EOSABS 0.1 11/17/2015 1228   BASOSABS 0.0 11/17/2015 1228    BMET    Component Value Date/Time   NA 147 (H) 02/18/2019 0345   NA 137 11/04/2018 1132   K 3.8 02/18/2019 0345   CL 119 (H) 02/18/2019 0345   CO2 19 (L) 02/18/2019 0345   GLUCOSE 209 (H) 02/18/2019 0345   BUN 44 (H) 02/18/2019 0345   BUN 24 11/04/2018 1132   CREATININE 2.11 (H) 02/18/2019 0345   CALCIUM 8.3 (L) 02/18/2019 0345   GFRNONAA 22 (L) 02/18/2019 0345   GFRAA 25 (L) 02/18/2019 0345    INR    Component Value Date/Time   INR 1.01 12/18/2016 1508     Intake/Output Summary (Last 24 hours) at 02/19/2019 0743 Last data filed at 02/19/2019 0645 Gross per 24 hour  Intake 3374.18 ml  Output 750 ml  Net 2624.18 ml   CT head 02/18/2019: IMPRESSION: 1. No acute intracranial abnormalities. 2. Mild cerebral atrophy with chronic microvascular ischemic changes in the cerebral white matter, as above.   Assessment:  78 y.o. female is s/p:  right lower extremity common femoral to tibioperoneal trunk bypass with patch angioplasty of the  common femoral and tibioperoneal artery.   3 Days Post-Op  Plan: -pt with +doppler signals right peroneal and AT.  Foot is mottled and she has minimal motor.  High risk for amputation.   -DVT prophylaxis:  Heparin gtt -pt more alert this morning.  CT revealed no acute abnormalities of the brain.  Did reveal chronic microvascular ischemic changes.   Leontine Locket, PA-C [facility] [phone] 02/19/2019 7:43 AM  I have independently interviewed and patient and agree with PA assessment and plan above.  She is more conversive today no complaints of pain but the foot is mottled.  There is a strong peroneal signal all the way to the distal ankle does not appear to be perfusing the foot unfortunately.  We will transfuse 2 units today.  She remains high risk for transtibial versus above-the-knee amputation.  Alegandro Macnaughton C. [name], MD [facility] [place] Office: [phone] Pager: [phone]

## 2019-02-20 LAB — GLUCOSE, CAPILLARY
Glucose-Capillary: 105 mg/dL — ABNORMAL HIGH (ref 70–99)
Glucose-Capillary: 110 mg/dL — ABNORMAL HIGH (ref 70–99)
Glucose-Capillary: 131 mg/dL — ABNORMAL HIGH (ref 70–99)
Glucose-Capillary: 140 mg/dL — ABNORMAL HIGH (ref 70–99)
Glucose-Capillary: 178 mg/dL — ABNORMAL HIGH (ref 70–99)
Glucose-Capillary: 207 mg/dL — ABNORMAL HIGH (ref 70–99)

## 2019-02-20 LAB — TYPE AND SCREEN
ABO/RH(D): A POS
Antibody Screen: POSITIVE
Unit division: 0
Unit division: 0
Unit division: 0
Unit division: 0
Unit division: 0
Unit division: 0
Unit division: 0
Unit division: 0

## 2019-02-20 LAB — BPAM RBC
Blood Product Expiration Date: 202012312359
Blood Product Expiration Date: 202012312359
Blood Product Expiration Date: 202012312359
Blood Product Expiration Date: 202012312359
Blood Product Expiration Date: 202012312359
Blood Product Expiration Date: 202012312359
Blood Product Expiration Date: 202101032359
Blood Product Expiration Date: 202101062359
ISSUE DATE / TIME: 202012081531
ISSUE DATE / TIME: 202012081531
ISSUE DATE / TIME: 202012081615
ISSUE DATE / TIME: 202012081615
ISSUE DATE / TIME: 202012081659
ISSUE DATE / TIME: 202012081659
ISSUE DATE / TIME: 202012110532
ISSUE DATE / TIME: 202012111044
Unit Type and Rh: 6200
Unit Type and Rh: 6200
Unit Type and Rh: 6200
Unit Type and Rh: 6200
Unit Type and Rh: 6200
Unit Type and Rh: 6200
Unit Type and Rh: 6200
Unit Type and Rh: 6200

## 2019-02-20 LAB — CBC
HCT: 26.4 % — ABNORMAL LOW (ref 36.0–46.0)
Hemoglobin: 8.8 g/dL — ABNORMAL LOW (ref 12.0–15.0)
MCH: 31.3 pg (ref 26.0–34.0)
MCHC: 33.3 g/dL (ref 30.0–36.0)
MCV: 94 fL (ref 80.0–100.0)
Platelets: 140 10*3/uL — ABNORMAL LOW (ref 150–400)
RBC: 2.81 MIL/uL — ABNORMAL LOW (ref 3.87–5.11)
RDW: 15.7 % — ABNORMAL HIGH (ref 11.5–15.5)
WBC: 4.6 10*3/uL (ref 4.0–10.5)
nRBC: 0 % (ref 0.0–0.2)

## 2019-02-20 LAB — BASIC METABOLIC PANEL
Anion gap: 10 (ref 5–15)
BUN: 25 mg/dL — ABNORMAL HIGH (ref 8–23)
CO2: 17 mmol/L — ABNORMAL LOW (ref 22–32)
Calcium: 7 mg/dL — ABNORMAL LOW (ref 8.9–10.3)
Chloride: 115 mmol/L — ABNORMAL HIGH (ref 98–111)
Creatinine, Ser: 1.31 mg/dL — ABNORMAL HIGH (ref 0.44–1.00)
GFR calc Af Amer: 45 mL/min — ABNORMAL LOW (ref >60)
GFR calc non Af Amer: 39 mL/min — ABNORMAL LOW (ref >60)
Glucose, Bld: 120 mg/dL — ABNORMAL HIGH (ref 70–99)
Potassium: 4.4 mmol/L (ref 3.5–5.1)
Sodium: 142 mmol/L (ref 135–145)

## 2019-02-20 LAB — HEPARIN LEVEL (UNFRACTIONATED)
Heparin Unfractionated: <0.1 IU/mL — ABNORMAL LOW (ref 0.30–0.70)
Heparin Unfractionated: 0.38 IU/mL (ref 0.30–0.70)

## 2019-02-20 LAB — CK TOTAL AND CKMB (NOT AT ARMC)
CK, MB: 6.2 ng/mL — ABNORMAL HIGH (ref 0.5–5.0)
Relative Index: 2.1 (ref 0.0–2.5)
Total CK: 291 U/L — ABNORMAL HIGH (ref 38–234)

## 2019-02-20 MED ORDER — HEPARIN BOLUS VIA INFUSION
1000.0000 [IU] | Freq: Once | INTRAVENOUS | Status: AC
  Administered 2019-02-20: 13:00:00 1000 [IU] via INTRAVENOUS
  Filled 2019-02-20: qty 1000

## 2019-02-20 NOTE — Progress Notes (Signed)
Heparin restarted 19.5 ml/hr. Pharmacy notified.

## 2019-02-20 NOTE — Progress Notes (Signed)
Patient seems to be more alert this AM. Patient could this RN that she was in the hospital and why she was here. Restraints remain for now. Will continue to monitor

## 2019-02-20 NOTE — Plan of Care (Signed)
  Problem: Health Behavior/Discharge Planning: Goal: Ability to manage health-related needs will improve Outcome: Not Progressing   Problem: Activity: Goal: Risk for activity intolerance will decrease Outcome: Not Progressing   Problem: Coping: Goal: Level of anxiety will decrease Outcome: Not Progressing   

## 2019-02-20 NOTE — Progress Notes (Addendum)
Patient had removed EKG leads. Upon entering the room, found patient naked, patient had pulled out peripheral IV and right portacath needle. Sites assessed, clean, dry and intact.   Paged Dr. [name] about patient. Order for wrist restraints and CK total.   Patient cleaned, linen changed and wrist restraints placed. Patient resting comfortably. Will continue to monitor

## 2019-02-20 NOTE — Progress Notes (Addendum)
   Progress Note    02/20/2019 9:12 AM 4 Days Post-Op  Subjective:  Patient is aware of appearance of R foot and need for amputation   Vitals:   02/20/19 0547 02/20/19 0829  BP: (!) 177/65 (!) 179/65  Pulse:    Resp:    Temp:    SpO2:     Physical Exam: Lungs:  Non labored Incisions:  R groin and RLE incisions c/d/i; L groin c/d/i Extremities:  Peroneal brisk by doppler at the level of the ankle RLE Neurologic: A&O  CBC    Component Value Date/Time   WBC 4.6 02/20/2019 0430   RBC 2.81 (L) 02/20/2019 0430   HGB 8.8 (L) 02/20/2019 0430   HCT 26.4 (L) 02/20/2019 0430   PLT 140 (L) 02/20/2019 0430   MCV 94.0 02/20/2019 0430   MCH 31.3 02/20/2019 0430   MCHC 33.3 02/20/2019 0430   RDW 15.7 (H) 02/20/2019 0430   LYMPHSABS 1.6 11/17/2015 1228   MONOABS 0.6 11/17/2015 1228   EOSABS 0.1 11/17/2015 1228   BASOSABS 0.0 11/17/2015 1228    BMET    Component Value Date/Time   NA 142 02/20/2019 0430   NA 137 11/04/2018 1132   K 4.4 02/20/2019 0430   CL 115 (H) 02/20/2019 0430   CO2 17 (L) 02/20/2019 0430   GLUCOSE 120 (H) 02/20/2019 0430   BUN 25 (H) 02/20/2019 0430   BUN 24 11/04/2018 1132   CREATININE 1.31 (H) 02/20/2019 0430   CALCIUM 7.0 (L) 02/20/2019 0430   GFRNONAA 39 (L) 02/20/2019 0430   GFRAA 45 (L) 02/20/2019 0430    INR    Component Value Date/Time   INR 1.01 12/18/2016 1508     Intake/Output Summary (Last 24 hours) at 02/20/2019 0912 Last data filed at 02/20/2019 0358 Gross per 24 hour  Intake 315 ml  Output 625 ml  Net -310 ml     Assessment/Plan:  78 y.o. female is s/p L CFA pseudo repair; R femoral to tp trunk bypass with GSV 4 Days Post-Op   Ischemic R foot despite patent bypass with brisk peroneal signal to the level of the ankle Patient aware of need for R BKA vs AKA Dr. [name] will discuss with her son today   Dagoberto Ligas, PA-C [facility] [phone] 02/20/2019 9:12 AM  I have independently  interviewed and examined patient and agree with PA assessment and plan above. Discussed with son about her right foot. Will likely require amputation this hospitalization despite adequate flow to the level of the ankle.   Konner Warrior C. [name], MD [facility] [place] Office: [phone] Pager: [phone]

## 2019-02-20 NOTE — Progress Notes (Signed)
OT Cancellation Note  Patient Details Name: Debra Hopkins MRN: [id number] DOB: 09-10-1940   Cancelled Treatment:    Reason Eval/Treat Not Completed: Patient declined, no reason specified;Other (comment). Pt tearful and in sad mood due to upcoming amputation  Britt Bottom 02/20/2019, 12:47 PM

## 2019-02-20 NOTE — Progress Notes (Signed)
ANTICOAGULATION CONSULT NOTE - Follow Up Consult  Pharmacy Consult for Heparin Indication: ischemic limb  Allergies  Allergen Reactions  . Amoxicillin-Pot Clavulanate Hives and Other (See Comments)    Has patient had a PCN reaction causing immediate rash, facial/tongue/throat swelling, SOB or lightheadedness with hypotension: UNKNOWN Has patient had a PCN reaction causing severe rash involving mucus membranes or skin necrosis: UNKNOWN Has patient had a PCN reaction that required hospitalization No Has patient had a PCN reaction occurring within the last 10 years: No If all of the above answers are "NO", then may proceed with Cephalosporin use.   . Byetta 10 Mcg Pen [Exenatide] Other (See Comments)    Unknown  . Ceclor [Cefaclor] Other (See Comments)    Unknown  . Celexa [Citalopram Hydrobromide] Other (See Comments)    Sick  . Ciprofloxacin Nausea Only and Other (See Comments)    Dizziness  . Claritin [Loratadine] Other (See Comments)    Hair fell out  . Erythromycin Other (See Comments)    REACTION: Upset stomach  . Flexeril [Cyclobenzaprine] Other (See Comments)    Nervous  . Fosamax [Alendronate] Other (See Comments)    Made pt sick  . Jardiance [Empagliflozin] Other (See Comments)    Vaginal infection, UTI  . Motrin [Ibuprofen] Nausea Only  . Naproxen Other (See Comments)    REACTION: severe nausea  . Norvasc [Amlodipine Besylate] Other (See Comments)    Unknown  . Prednisone Other (See Comments)    REACTION: itchy rash  . Prozac [Fluoxetine Hcl] Other (See Comments)    Sick  . Sulfamethoxazole-Trimethoprim Nausea And Vomiting and Other (See Comments)    REACTION: Upset stomach    Patient Measurements: Height: 5\' 3"  (160 cm) Weight: 176 lb 12.9 oz (80.2 kg) IBW/kg (Calculated) : 52.4 Heparin Dosing Weight: 68 kg  Vital Signs: Temp: 98.6 F (37 C) (12/12 1223) Temp Source: Oral (12/12 1223) BP: 162/55 (12/12 1223) Pulse Rate: 77 (12/12 1223)  Labs: Recent  Labs    02/18/19 0345 02/19/19 0311 02/19/19 1828 02/20/19 0430 02/20/19 1122  HGB 7.5* 6.9*  --  8.8*  --   HCT 23.1* 20.6*  --  26.4*  --   PLT 139* 139*  --  140*  --   HEPARINUNFRC  --  0.15* 0.19*  --  <0.10*  CREATININE 2.11*  --   --  1.31*  --   CKTOTAL  --   --   --  291*  --   CKMB  --   --   --  6.2*  --     Estimated Creatinine Clearance: 34.9 mL/min (A) (by C-G formula based on SCr of 1.31 mg/dL (H)).  Assessment: 78 yr old female on IV heparin for ischemic limb. Surgery 12/8 for R fem-pop BPG, L femoral pseudoaneurysm repair. Likely amputation this admission. Of note, she has had about 4 units pRBC transfused on 12/11. H&H today is 8.8/26.4, plts are low at 140. Noted to have hematoma in L groin area.   Heparin levels have not been therapeutic, including today's HL of < 0.1 after increase to 1950 units/hour. Per nursing no bleeding, only bruising around IV lines, and no infusion issues since heparin restarted at 0300.  Goal of Therapy:  Heparin level 0.3-0.7 units/ml Monitor platelets by anticoagulation protocol: Yes   Plan:  Conservative bolus d/t hematoma of 1000 units  Increase heparin gtt to 2150 units/hr (increased by about 3 units/kg) F/u 8 hour heparin level   Thank you,  Eddie Candle, PharmD PGY-1 Pharmacy Resident   Please check amion for clinical pharmacist contact number

## 2019-02-20 NOTE — Plan of Care (Signed)
  Problem: Clinical Measurements: Goal: Respiratory complications will improve Outcome: Progressing Goal: Cardiovascular complication will be avoided Outcome: Progressing   Problem: Health Behavior/Discharge Planning: Goal: Ability to manage health-related needs will improve Outcome: Not Progressing   Problem: Activity: Goal: Risk for activity intolerance will decrease Outcome: Not Progressing   

## 2019-02-20 NOTE — Progress Notes (Signed)
ANTICOAGULATION CONSULT NOTE - Follow Up Consult  Pharmacy Consult for Heparin Indication: ischemic limb  Allergies  Allergen Reactions  . Amoxicillin-Pot Clavulanate Hives and Other (See Comments)    Has patient had a PCN reaction causing immediate rash, facial/tongue/throat swelling, SOB or lightheadedness with hypotension: UNKNOWN Has patient had a PCN reaction causing severe rash involving mucus membranes or skin necrosis: UNKNOWN Has patient had a PCN reaction that required hospitalization No Has patient had a PCN reaction occurring within the last 10 years: No If all of the above answers are "NO", then may proceed with Cephalosporin use.   . Byetta 10 Mcg Pen [Exenatide] Other (See Comments)    Unknown  . Ceclor [Cefaclor] Other (See Comments)    Unknown  . Celexa [Citalopram Hydrobromide] Other (See Comments)    Sick  . Ciprofloxacin Nausea Only and Other (See Comments)    Dizziness  . Claritin [Loratadine] Other (See Comments)    Hair fell out  . Erythromycin Other (See Comments)    REACTION: Upset stomach  . Flexeril [Cyclobenzaprine] Other (See Comments)    Nervous  . Fosamax [Alendronate] Other (See Comments)    Made pt sick  . Jardiance [Empagliflozin] Other (See Comments)    Vaginal infection, UTI  . Motrin [Ibuprofen] Nausea Only  . Naproxen Other (See Comments)    REACTION: severe nausea  . Norvasc [Amlodipine Besylate] Other (See Comments)    Unknown  . Prednisone Other (See Comments)    REACTION: itchy rash  . Prozac [Fluoxetine Hcl] Other (See Comments)    Sick  . Sulfamethoxazole-Trimethoprim Nausea And Vomiting and Other (See Comments)    REACTION: Upset stomach    Patient Measurements: Height: 5\' 3"  (160 cm) Weight: 176 lb 12.9 oz (80.2 kg) IBW/kg (Calculated) : 52.4 Heparin Dosing Weight: 68 kg  Vital Signs: Temp: 97.3 F (36.3 C) (12/12 2057) Temp Source: Oral (12/12 2057) BP: 172/58 (12/12 2057) Pulse Rate: 90 (12/12  2057)  Labs: Recent Labs    02/18/19 0345 02/19/19 0311 02/19/19 1828 02/20/19 0430 02/20/19 1122  HGB 7.5* 6.9*  --  8.8*  --   HCT 23.1* 20.6*  --  26.4*  --   PLT 139* 139*  --  140*  --   HEPARINUNFRC  --  0.15* 0.19*  --  <0.10*  CREATININE 2.11*  --   --  1.31*  --   CKTOTAL  --   --   --  291*  --   CKMB  --   --   --  6.2*  --     Estimated Creatinine Clearance: 34.9 mL/min (A) (by C-G formula based on SCr of 1.31 mg/dL (H)).  Assessment: 78 yr old female on IV heparin for ischemic limb. Surgery 12/8 for R fem-pop BPG, L femoral pseudoaneurysm repair. Likely amputation this admission. Of note, she has had about 4 units pRBC transfused on 12/11. H&H today is 8.8/26.4, plts are low at 140. Noted to have hematoma in L groin area.   Heparin level is therapeutic.  Hematoma is not worse and no bleeding observed per RN.  Goal of Therapy:  Heparin level 0.3-0.7 units/ml Monitor platelets by anticoagulation protocol: Yes   Plan:  Continue heparin gtt at 2150 units/hr F/U AM labs  Seila Liston D. Mina Marble, PharmD, BCPS, [facility] 02/20/2019, 10:18 PM

## 2019-02-21 LAB — GLUCOSE, CAPILLARY
Glucose-Capillary: 131 mg/dL — ABNORMAL HIGH (ref 70–99)
Glucose-Capillary: 122 mg/dL — ABNORMAL HIGH (ref 70–99)
Glucose-Capillary: 129 mg/dL — ABNORMAL HIGH (ref 70–99)
Glucose-Capillary: 177 mg/dL — ABNORMAL HIGH (ref 70–99)
Glucose-Capillary: 129 mg/dL — ABNORMAL HIGH (ref 70–99)
Glucose-Capillary: 138 mg/dL — ABNORMAL HIGH (ref 70–99)

## 2019-02-21 LAB — HEPARIN LEVEL (UNFRACTIONATED): Heparin Unfractionated: 0.37 IU/mL (ref 0.30–0.70)

## 2019-02-21 LAB — CBC
HCT: 26.2 % — ABNORMAL LOW (ref 36.0–46.0)
Hemoglobin: 8.6 g/dL — ABNORMAL LOW (ref 12.0–15.0)
MCH: 30.7 pg (ref 26.0–34.0)
MCHC: 32.8 g/dL (ref 30.0–36.0)
MCV: 93.6 fL (ref 80.0–100.0)
Platelets: 139 10*3/uL — ABNORMAL LOW (ref 150–400)
RBC: 2.8 MIL/uL — ABNORMAL LOW (ref 3.87–5.11)
RDW: 15.1 % (ref 11.5–15.5)
WBC: 4.1 10*3/uL (ref 4.0–10.5)
nRBC: 0.5 % — ABNORMAL HIGH (ref 0.0–0.2)

## 2019-02-21 NOTE — H&P (View-Only) (Signed)
  Progress Note    02/21/2019 9:14 AM 5 Days Post-Op  Subjective:  Willing to proceed with amputation   Vitals:   02/21/19 0436 02/21/19 0857  BP: (!) 187/76 (!) 185/67  Pulse: 77 92  Resp: 19 18  Temp: 98.1 F (36.7 C) (!) 97.5 F (36.4 C)  SpO2: 100% 100%   Physical Exam: Lungs:  Non labored Incisions:  RLE incisions c/d/i; L groin incision also healing well Extremities:  Ischemic R foot Neurologic: A&O  CBC    Component Value Date/Time   WBC 4.1 02/21/2019 0235   RBC 2.80 (L) 02/21/2019 0235   HGB 8.6 (L) 02/21/2019 0235   HCT 26.2 (L) 02/21/2019 0235   PLT 139 (L) 02/21/2019 0235   MCV 93.6 02/21/2019 0235   MCH 30.7 02/21/2019 0235   MCHC 32.8 02/21/2019 0235   RDW 15.1 02/21/2019 0235   LYMPHSABS 1.6 11/17/2015 1228   MONOABS 0.6 11/17/2015 1228   EOSABS 0.1 11/17/2015 1228   BASOSABS 0.0 11/17/2015 1228    BMET    Component Value Date/Time   NA 142 02/20/2019 0430   NA 137 11/04/2018 1132   K 4.4 02/20/2019 0430   CL 115 (H) 02/20/2019 0430   CO2 17 (L) 02/20/2019 0430   GLUCOSE 120 (H) 02/20/2019 0430   BUN 25 (H) 02/20/2019 0430   BUN 24 11/04/2018 1132   CREATININE 1.31 (H) 02/20/2019 0430   CALCIUM 7.0 (L) 02/20/2019 0430   GFRNONAA 39 (L) 02/20/2019 0430   GFRAA 45 (L) 02/20/2019 0430    INR    Component Value Date/Time   INR 1.01 12/18/2016 1508     Intake/Output Summary (Last 24 hours) at 02/21/2019 0914 Last data filed at 02/21/2019 0825 Gross per 24 hour  Intake 1827.09 ml  Output 200 ml  Net 1627.09 ml     Assessment/Plan:  78 y.o. female is s/p L CFA pseudo repair; R femoral to tp trunk bypass with GSV 5 Days Post-Op   Ischemic R foot despite patent bypass Patient will require R AKA vs BKA during this hospitalization Will discuss timing with family today   Dagoberto Ligas, PA-C [facility] [phone] 02/21/2019 9:14 AM   I have independently interviewed and examined patient and agree with  PA assessment and plan above.  Given appearance of foot no certainly is going to require amputation.  I spoke with her son yesterday will talk with him again today.  She seems more amenable to amputation at this time and will likely plan for early this week.  Phil Michels C. [name], MD [facility] [place] Office: [phone] Pager: [phone]

## 2019-02-21 NOTE — Progress Notes (Addendum)
  Progress Note    02/21/2019 9:14 AM 5 Days Post-Op  Subjective:  Willing to proceed with amputation   Vitals:   02/21/19 0436 02/21/19 0857  BP: (!) 187/76 (!) 185/67  Pulse: 77 92  Resp: 19 18  Temp: 98.1 F (36.7 C) (!) 97.5 F (36.4 C)  SpO2: 100% 100%   Physical Exam: Lungs:  Non labored Incisions:  RLE incisions c/d/i; L groin incision also healing well Extremities:  Ischemic R foot Neurologic: A&O  CBC    Component Value Date/Time   WBC 4.1 02/21/2019 0235   RBC 2.80 (L) 02/21/2019 0235   HGB 8.6 (L) 02/21/2019 0235   HCT 26.2 (L) 02/21/2019 0235   PLT 139 (L) 02/21/2019 0235   MCV 93.6 02/21/2019 0235   MCH 30.7 02/21/2019 0235   MCHC 32.8 02/21/2019 0235   RDW 15.1 02/21/2019 0235   LYMPHSABS 1.6 11/17/2015 1228   MONOABS 0.6 11/17/2015 1228   EOSABS 0.1 11/17/2015 1228   BASOSABS 0.0 11/17/2015 1228    BMET    Component Value Date/Time   NA 142 02/20/2019 0430   NA 137 11/04/2018 1132   K 4.4 02/20/2019 0430   CL 115 (H) 02/20/2019 0430   CO2 17 (L) 02/20/2019 0430   GLUCOSE 120 (H) 02/20/2019 0430   BUN 25 (H) 02/20/2019 0430   BUN 24 11/04/2018 1132   CREATININE 1.31 (H) 02/20/2019 0430   CALCIUM 7.0 (L) 02/20/2019 0430   GFRNONAA 39 (L) 02/20/2019 0430   GFRAA 45 (L) 02/20/2019 0430    INR    Component Value Date/Time   INR 1.01 12/18/2016 1508     Intake/Output Summary (Last 24 hours) at 02/21/2019 0914 Last data filed at 02/21/2019 0825 Gross per 24 hour  Intake 1827.09 ml  Output 200 ml  Net 1627.09 ml     Assessment/Plan:  78 y.o. female is s/p L CFA pseudo repair; R femoral to tp trunk bypass with GSV 5 Days Post-Op   Ischemic R foot despite patent bypass Patient will require R AKA vs BKA during this hospitalization Will discuss timing with family today   Dagoberto Ligas, PA-C [facility] [phone] 02/21/2019 9:14 AM   I have independently interviewed and examined patient and agree with  PA assessment and plan above.  Given appearance of foot no certainly is going to require amputation.  I spoke with her son yesterday will talk with him again today.  She seems more amenable to amputation at this time and will likely plan for early this week.  Shontez Sermon C. [name], MD [facility] Office: [phone] Pager: [phone]

## 2019-02-21 NOTE — Progress Notes (Signed)
ANTICOAGULATION CONSULT NOTE - Follow Up Consult  Pharmacy Consult for Heparin Indication: ischemic limb  Allergies  Allergen Reactions  . Amoxicillin-Pot Clavulanate Hives and Other (See Comments)    Has patient had a PCN reaction causing immediate rash, facial/tongue/throat swelling, SOB or lightheadedness with hypotension: UNKNOWN Has patient had a PCN reaction causing severe rash involving mucus membranes or skin necrosis: UNKNOWN Has patient had a PCN reaction that required hospitalization No Has patient had a PCN reaction occurring within the last 10 years: No If all of the above answers are "NO", then may proceed with Cephalosporin use.   . Byetta 10 Mcg Pen [Exenatide] Other (See Comments)    Unknown  . Ceclor [Cefaclor] Other (See Comments)    Unknown  . Celexa [Citalopram Hydrobromide] Other (See Comments)    Sick  . Ciprofloxacin Nausea Only and Other (See Comments)    Dizziness  . Claritin [Loratadine] Other (See Comments)    Hair fell out  . Erythromycin Other (See Comments)    REACTION: Upset stomach  . Flexeril [Cyclobenzaprine] Other (See Comments)    Nervous  . Fosamax [Alendronate] Other (See Comments)    Made pt sick  . Jardiance [Empagliflozin] Other (See Comments)    Vaginal infection, UTI  . Motrin [Ibuprofen] Nausea Only  . Naproxen Other (See Comments)    REACTION: severe nausea  . Norvasc [Amlodipine Besylate] Other (See Comments)    Unknown  . Prednisone Other (See Comments)    REACTION: itchy rash  . Prozac [Fluoxetine Hcl] Other (See Comments)    Sick  . Sulfamethoxazole-Trimethoprim Nausea And Vomiting and Other (See Comments)    REACTION: Upset stomach    Patient Measurements: Height: 5\' 3"  (160 cm) Weight: 176 lb 12.9 oz (80.2 kg) IBW/kg (Calculated) : 52.4 Heparin Dosing Weight: 68 kg  Vital Signs: Temp: 98.1 F (36.7 C) (12/13 0436) Temp Source: Oral (12/13 0436) BP: 187/76 (12/13 0436) Pulse Rate: 77 (12/13  0436)  Labs: Recent Labs    02/19/19 0311 02/20/19 0430 02/20/19 1122 02/20/19 2147 02/21/19 0235  HGB 6.9* 8.8*  --   --  8.6*  HCT 20.6* 26.4*  --   --  26.2*  PLT 139* 140*  --   --  139*  HEPARINUNFRC 0.15*  --  <0.10* 0.38 0.37  CREATININE  --  1.31*  --   --   --   CKTOTAL  --  291*  --   --   --   CKMB  --  6.2*  --   --   --     Estimated Creatinine Clearance: 34.9 mL/min (A) (by C-G formula based on SCr of 1.31 mg/dL (H)).  Assessment: 78 yr old female on IV heparin for ischemic limb. Surgery 12/8 for R fem-pop BPG, L femoral pseudoaneurysm repair. Noted to have hematoma in L groin area, likely BKA this admission.   Heparin level this morning remains low in therapeutic range. No issues with infusion lines or lab draws reported per RN. Will increase to keep in therapeutic range. Patient had 2 units RBC on 12/11, H&H has improved slightly and remains stable today, no issues with bleeding reported either.  Goal of Therapy:  Heparin level 0.3-0.7 units/ml Monitor platelets by anticoagulation protocol: Yes   Plan:  Increase heparin to 2250 units/hour Daily heparin levels and CBC Monitor for s/sx's of bleed     Thank you for allowing pharmacy to participate in this patient's care.  Romell Cavanah L. Devin Going, [place] PGY1 Pharmacy  Resident [phone] 02/21/19      8:22 AM  Please check AMION for all [place] phone numbers After 10:00 PM, call the [facility] [phone]

## 2019-02-22 ENCOUNTER — Encounter (HOSPITAL_COMMUNITY)
Admission: RE | Disposition: A | Discharge status: Rehabilitation Facility | Payer: Self-pay | Source: Home / Self Care | Attending: Vascular Surgery

## 2019-02-22 ENCOUNTER — Inpatient Hospital Stay (HOSPITAL_COMMUNITY): Payer: Medicare Other | Admitting: Certified Registered"

## 2019-02-22 DIAGNOSIS — I96 Gangrene, not elsewhere classified: Secondary | ICD-10-CM

## 2019-02-22 HISTORY — PX: AMPUTATION: SHX166

## 2019-02-22 LAB — CBC
HCT: 27.1 % — ABNORMAL LOW (ref 36.0–46.0)
HCT: 24.8 % — ABNORMAL LOW (ref 36.0–46.0)
Hemoglobin: 9.1 g/dL — ABNORMAL LOW (ref 12.0–15.0)
Hemoglobin: 8.2 g/dL — ABNORMAL LOW (ref 12.0–15.0)
MCH: 30.8 pg (ref 26.0–34.0)
MCH: 30.6 pg (ref 26.0–34.0)
MCHC: 33.6 g/dL (ref 30.0–36.0)
MCHC: 33.1 g/dL (ref 30.0–36.0)
MCV: 91.9 fL (ref 80.0–100.0)
MCV: 92.5 fL (ref 80.0–100.0)
Platelets: 129 10*3/uL — ABNORMAL LOW (ref 150–400)
Platelets: 120 10*3/uL — ABNORMAL LOW (ref 150–400)
RBC: 2.95 MIL/uL — ABNORMAL LOW (ref 3.87–5.11)
RBC: 2.68 MIL/uL — ABNORMAL LOW (ref 3.87–5.11)
RDW: 14.8 % (ref 11.5–15.5)
RDW: 14.9 % (ref 11.5–15.5)
WBC: 3.6 10*3/uL — ABNORMAL LOW (ref 4.0–10.5)
WBC: 2.8 10*3/uL — ABNORMAL LOW (ref 4.0–10.5)
nRBC: 0 % (ref 0.0–0.2)
nRBC: 0 % (ref 0.0–0.2)

## 2019-02-22 LAB — GLUCOSE, CAPILLARY
Glucose-Capillary: 140 mg/dL — ABNORMAL HIGH (ref 70–99)
Glucose-Capillary: 148 mg/dL — ABNORMAL HIGH (ref 70–99)
Glucose-Capillary: 179 mg/dL — ABNORMAL HIGH (ref 70–99)
Glucose-Capillary: 182 mg/dL — ABNORMAL HIGH (ref 70–99)
Glucose-Capillary: 195 mg/dL — ABNORMAL HIGH (ref 70–99)
Glucose-Capillary: 215 mg/dL — ABNORMAL HIGH (ref 70–99)

## 2019-02-22 LAB — CREATININE, SERUM
Creatinine, Ser: 1.01 mg/dL — ABNORMAL HIGH (ref 0.44–1.00)
GFR calc Af Amer: >60 mL/min (ref >60)
GFR calc non Af Amer: 53 mL/min — ABNORMAL LOW (ref >60)

## 2019-02-22 LAB — HEPARIN LEVEL (UNFRACTIONATED): Heparin Unfractionated: 0.31 IU/mL (ref 0.30–0.70)

## 2019-02-22 SURGERY — AMPUTATION, ABOVE KNEE
Anesthesia: General | Site: Knee | Laterality: Right

## 2019-02-22 MED ORDER — FENTANYL CITRATE (PF) 250 MCG/5ML IJ SOLN
INTRAMUSCULAR | Status: AC
  Filled 2019-02-22: qty 5

## 2019-02-22 MED ORDER — 0.9 % SODIUM CHLORIDE (POUR BTL) OPTIME
TOPICAL | Status: DC | PRN
  Administered 2019-02-22: 08:00:00 1000 mL

## 2019-02-22 MED ORDER — FENTANYL CITRATE (PF) 250 MCG/5ML IJ SOLN
INTRAMUSCULAR | Status: DC | PRN
  Administered 2019-02-22: 100 ug via INTRAVENOUS
  Administered 2019-02-22 (×5): 50 ug via INTRAVENOUS

## 2019-02-22 MED ORDER — STERILE WATER FOR IRRIGATION IR SOLN
Status: DC | PRN
  Administered 2019-02-22: 1000 mL

## 2019-02-22 MED ORDER — VANCOMYCIN HCL IN DEXTROSE 1-5 GM/200ML-% IV SOLN
1000.0000 mg | Freq: Two times a day (BID) | INTRAVENOUS | Status: AC
  Administered 2019-02-23: 1000 mg via INTRAVENOUS
  Filled 2019-02-22: qty 200

## 2019-02-22 MED ORDER — METOCLOPRAMIDE HCL 5 MG/ML IJ SOLN: 10.0000 mg | Freq: Once | INTRAMUSCULAR | Status: DC | PRN

## 2019-02-22 MED ORDER — LIDOCAINE 2% (20 MG/ML) 5 ML SYRINGE
INTRAMUSCULAR | Status: DC | PRN
  Administered 2019-02-22: 100 mg via INTRAVENOUS

## 2019-02-22 MED ORDER — DEXAMETHASONE SODIUM PHOSPHATE 10 MG/ML IJ SOLN
INTRAMUSCULAR | Status: DC | PRN
  Administered 2019-02-22: 5 mg via INTRAVENOUS

## 2019-02-22 MED ORDER — MORPHINE SULFATE (PF) 2 MG/ML IV SOLN
2.0000 mg | INTRAVENOUS | Status: DC | PRN
  Administered 2019-02-22 – 2019-03-02 (×11): 2 mg via INTRAVENOUS
  Filled 2019-02-22 (×12): qty 1

## 2019-02-22 MED ORDER — MEPERIDINE HCL 25 MG/ML IJ SOLN: 6.2500 mg | INTRAMUSCULAR | Status: DC | PRN

## 2019-02-22 MED ORDER — ROCURONIUM BROMIDE 10 MG/ML (PF) SYRINGE
PREFILLED_SYRINGE | INTRAVENOUS | Status: DC | PRN
  Administered 2019-02-22: 60 mg via INTRAVENOUS

## 2019-02-22 MED ORDER — VANCOMYCIN HCL IN DEXTROSE 1-5 GM/200ML-% IV SOLN
INTRAVENOUS | Status: AC
  Filled 2019-02-22: qty 200

## 2019-02-22 MED ORDER — PROPOFOL 10 MG/ML IV BOLUS
INTRAVENOUS | Status: DC | PRN
  Administered 2019-02-22: 140 mg via INTRAVENOUS

## 2019-02-22 MED ORDER — ONDANSETRON HCL 4 MG/2ML IJ SOLN
INTRAMUSCULAR | Status: DC | PRN
  Administered 2019-02-22: 4 mg via INTRAVENOUS

## 2019-02-22 MED ORDER — SUGAMMADEX SODIUM 200 MG/2ML IV SOLN
INTRAVENOUS | Status: DC | PRN
  Administered 2019-02-22: 200 mg via INTRAVENOUS

## 2019-02-22 MED ORDER — FENTANYL CITRATE (PF) 100 MCG/2ML IJ SOLN: 25.0000 ug | INTRAMUSCULAR | Status: DC | PRN

## 2019-02-22 MED ORDER — VANCOMYCIN HCL 1000 MG IV SOLR
INTRAVENOUS | Status: DC | PRN
  Administered 2019-02-22: 1000 mg via INTRAVENOUS

## 2019-02-22 MED ORDER — PHENYLEPHRINE 40 MCG/ML (10ML) SYRINGE FOR IV PUSH (FOR BLOOD PRESSURE SUPPORT)
PREFILLED_SYRINGE | INTRAVENOUS | Status: DC | PRN
  Administered 2019-02-22: 80 ug via INTRAVENOUS

## 2019-02-22 MED ORDER — PHENYLEPHRINE HCL-NACL 10-0.9 MG/250ML-% IV SOLN
INTRAVENOUS | Status: DC | PRN
  Administered 2019-02-22: 25 ug/min via INTRAVENOUS

## 2019-02-22 MED ORDER — SODIUM CHLORIDE 0.9 % IV SOLN
INTRAVENOUS | Status: DC
  Administered 2019-02-22: 12:00:00 via INTRAVENOUS

## 2019-02-22 MED ORDER — LACTATED RINGERS IV SOLN
INTRAVENOUS | Status: DC | PRN
  Administered 2019-02-22 (×2): via INTRAVENOUS

## 2019-02-22 MED ORDER — HEPARIN SODIUM (PORCINE) 5000 UNIT/ML IJ SOLN
5000.0000 [IU] | Freq: Three times a day (TID) | INTRAMUSCULAR | Status: DC
  Administered 2019-02-23 – 2019-03-02 (×22): 5000 [IU] via SUBCUTANEOUS
  Filled 2019-02-22 (×22): qty 1

## 2019-02-22 MED ORDER — PROPOFOL 10 MG/ML IV BOLUS
INTRAVENOUS | Status: AC
  Filled 2019-02-22: qty 20

## 2019-02-22 MED ORDER — POLYETHYLENE GLYCOL 3350 17 G PO PACK
17.0000 g | PACK | Freq: Every day | ORAL | Status: DC | PRN
  Administered 2019-02-26 – 2019-03-01 (×2): 17 g via ORAL
  Filled 2019-02-22 (×2): qty 1

## 2019-02-22 SURGICAL SUPPLY — 52 items
BANDAGE ESMARK 6X9 LF (GAUZE/BANDAGES/DRESSINGS) IMPLANT
BLADE SAW GIGLI 510 (BLADE) ×2 IMPLANT
BLADE SAW GIGLI 510MM (BLADE) ×1
BNDG CMPR 9X6 STRL LF SNTH (GAUZE/BANDAGES/DRESSINGS)
BNDG ELASTIC 4X5.8 VLCR STR LF (GAUZE/BANDAGES/DRESSINGS) ×3 IMPLANT
BNDG ELASTIC 6X5.8 VLCR STR LF (GAUZE/BANDAGES/DRESSINGS) ×2 IMPLANT
BNDG ESMARK 6X9 LF (GAUZE/BANDAGES/DRESSINGS)
BNDG GAUZE ELAST 4 BULKY (GAUZE/BANDAGES/DRESSINGS) ×2 IMPLANT
CANISTER SUCT 3000ML PPV (MISCELLANEOUS) ×3 IMPLANT
CLIP LIGATING EXTRA MED SLVR (CLIP) ×3 IMPLANT
CLIP LIGATING EXTRA SM BLUE (MISCELLANEOUS) ×3 IMPLANT
COVER SURGICAL LIGHT HANDLE (MISCELLANEOUS) ×6 IMPLANT
COVER WAND RF STERILE (DRAPES) ×1 IMPLANT
CUFF TOURN SGL QUICK 34 (TOURNIQUET CUFF)
CUFF TOURN SGL QUICK 42 (TOURNIQUET CUFF) IMPLANT
CUFF TRNQT CYL 34X4.125X (TOURNIQUET CUFF) IMPLANT
DRAIN SNY 10X20 3/4 PERF (WOUND CARE) IMPLANT
DRAPE HALF SHEET 40X57 (DRAPES) ×3 IMPLANT
DRAPE ORTHO SPLIT 77X108 STRL (DRAPES) ×6
DRAPE SURG ORHT 6 SPLT 77X108 (DRAPES) ×2 IMPLANT
ELECT REM PT RETURN 9FT ADLT (ELECTROSURGICAL) ×3
ELECTRODE REM PT RTRN 9FT ADLT (ELECTROSURGICAL) ×1 IMPLANT
EVACUATOR SILICONE 100CC (DRAIN) IMPLANT
GAUZE SPONGE 4X4 12PLY STRL (GAUZE/BANDAGES/DRESSINGS) ×3 IMPLANT
GAUZE SPONGE 4X4 12PLY STRL LF (GAUZE/BANDAGES/DRESSINGS) ×4 IMPLANT
GAUZE XEROFORM 1X8 LF (GAUZE/BANDAGES/DRESSINGS) ×2 IMPLANT
GAUZE XEROFORM 5X9 LF (GAUZE/BANDAGES/DRESSINGS) ×3 IMPLANT
GLOVE BIO SURGEON STRL SZ 6.5 (GLOVE) ×2 IMPLANT
GLOVE BIO SURGEONS STRL SZ 6.5 (GLOVE) ×2
GLOVE BIOGEL PI IND STRL 6.5 (GLOVE) IMPLANT
GLOVE BIOGEL PI IND STRL 7.0 (GLOVE) IMPLANT
GLOVE BIOGEL PI INDICATOR 6.5 (GLOVE) ×2
GLOVE BIOGEL PI INDICATOR 7.0 (GLOVE) ×2
GLOVE SS BIOGEL STRL SZ 7.5 (GLOVE) ×1 IMPLANT
GLOVE SUPERSENSE BIOGEL SZ 7.5 (GLOVE) ×2
GOWN STRL REUS W/ TWL LRG LVL3 (GOWN DISPOSABLE) ×3 IMPLANT
GOWN STRL REUS W/TWL LRG LVL3 (GOWN DISPOSABLE) ×15
KIT BASIN OR (CUSTOM PROCEDURE TRAY) ×3 IMPLANT
KIT TURNOVER KIT B (KITS) ×3 IMPLANT
NS IRRIG 1000ML POUR BTL (IV SOLUTION) ×3 IMPLANT
PACK GENERAL/GYN (CUSTOM PROCEDURE TRAY) ×3 IMPLANT
PAD ARMBOARD 7.5X6 YLW CONV (MISCELLANEOUS) ×6 IMPLANT
PADDING CAST COTTON 6X4 STRL (CAST SUPPLIES) IMPLANT
STAPLER VISISTAT 35W (STAPLE) ×3 IMPLANT
STOCKINETTE IMPERVIOUS LG (DRAPES) ×3 IMPLANT
SUT ETHILON 3 0 PS 1 (SUTURE) IMPLANT
SUT VIC AB 0 CT1 18XCR BRD 8 (SUTURE) ×2 IMPLANT
SUT VIC AB 0 CT1 8-18 (SUTURE) ×6
SUT VICRYL AB 2 0 TIES (SUTURE) ×3 IMPLANT
TOWEL GREEN STERILE (TOWEL DISPOSABLE) ×6 IMPLANT
UNDERPAD 30X30 (UNDERPADS AND DIAPERS) ×3 IMPLANT
WATER STERILE IRR 1000ML POUR (IV SOLUTION) ×3 IMPLANT

## 2019-02-22 NOTE — Plan of Care (Signed)
  Problem: Clinical Measurements: Goal: Respiratory complications will improve Outcome: Progressing Goal: Cardiovascular complication will be avoided Outcome: Progressing   Problem: Activity: Goal: Risk for activity intolerance will decrease Outcome: Not Progressing   Problem: Coping: Goal: Level of anxiety will decrease Outcome: Not Progressing   Problem: Pain Managment: Goal: General experience of comfort will improve Outcome: Not Progressing

## 2019-02-22 NOTE — Anesthesia Procedure Notes (Signed)
Procedure Name: Intubation Date/Time: 02/22/2019 7:42 AM Performed by: Gaylene Brooks, CRNA Pre-anesthesia Checklist: Patient identified, Emergency Drugs available, Suction available and Patient being monitored Patient Re-evaluated:Patient Re-evaluated prior to induction Oxygen Delivery Method: Circle System Utilized Preoxygenation: Pre-oxygenation with 100% oxygen Induction Type: IV induction Ventilation: Mask ventilation without difficulty Laryngoscope Size: Miller and 2 Grade View: Grade I Tube type: Oral Tube size: 7.0 mm Number of attempts: 1 Airway Equipment and Method: Stylet and Oral airway Placement Confirmation: ETT inserted through vocal cords under direct vision,  positive ETCO2 and breath sounds checked- equal and bilateral Secured at: 21 cm Tube secured with: Tape Dental Injury: Teeth and Oropharynx as per pre-operative assessment

## 2019-02-22 NOTE — Transfer of Care (Signed)
Immediate Anesthesia Transfer of Care Note  Patient: Debra Hopkins  Procedure(s) Performed: RIGHT ABOVE KNEE AMPUTATION (Right Knee)  Patient Location: PACU  Anesthesia Type:General  Level of Consciousness: awake, alert  and drowsy  Airway & Oxygen Therapy: Patient Spontanous Breathing and Patient connected to nasal cannula oxygen  Post-op Assessment: Report given to RN, Post -op Vital signs reviewed and stable and Patient moving all extremities X 4  Post vital signs: Reviewed and stable  Last Vitals:  Vitals Value Taken Time  BP 189/63 02/22/19 0926  Temp    Pulse 98 02/22/19 0927  Resp 17 02/22/19 0927  SpO2 100 % 02/22/19 0927  Vitals shown include unvalidated device data.  Last Pain:  Vitals:   02/22/19 0422  TempSrc: Oral  PainSc:       Patients Stated Pain Goal: 0 ([id number])  Complications: No apparent anesthesia complications

## 2019-02-22 NOTE — Progress Notes (Signed)
PT Cancellation Note  Patient Details Name: Debra Hopkins MRN: :[id number] DOB: 12-10-1940   Cancelled Treatment:    Reason Eval/Treat Not Completed: Patient at procedure or test/unavailable. Pt currently off unit with vascular surgery. Will check back as schedule allows to continue with PT POC.    [name] 02/22/2019, 7:58 AM   Rolinda Roan, PT, DPT Acute Rehabilitation Services Pager: [phone] Office: [phone]

## 2019-02-22 NOTE — Op Note (Signed)
    OPERATIVE REPORT  DATE OF SURGERY: 02/22/2019  PATIENT: Debra Hopkins, 78 y.o. female MRN: [id number]  DOB: 1941-03-08  PRE-OPERATIVE DIAGNOSIS: Gangrene right foot  POST-OPERATIVE DIAGNOSIS:  Same  PROCEDURE: Right above-knee amputation  SURGEON:  Curt Jews, M.D.  PHYSICIAN ASSISTANT: Liana Crocker, PA-C  ANESTHESIA: General  EBL: per anesthesia record  Total I/O In: 1250 [I.V.:1000; IV Piggyback:250] Out: 250 [Blood:250]  BLOOD ADMINISTERED: none  DRAINS: none  SPECIMEN: none  COUNTS CORRECT:  YES  PATIENT DISPOSITION:  PACU - hemodynamically stable  PROCEDURE DETAILS: Patient was seen in the preoperative holding area.  She has profound ischemia of her foot which is nonviable despite patent femoral to tibioperoneal trunk bypass.  I discussed option for above-knee versus below-knee amputation.  She has some breakdown in her calf incision.  I explained to her that there was significant risk of nonhealing with a below-knee amputation and a much greater option for healing with an above-knee amputation.  I did explain that there is more difficult rehab with above-knee versus below-knee.  Patient states that she does not want to consider any further procedures and wishes to proceed with above-knee amputation.  She was taken operating placed supine position where the area of the right leg prepped draped you sterile fashion.  A fishmouth incision was made in the above-knee position.  The patient had a prior total knee replacement.  This did not come into play.  There was no evidence of ischemia at the muscle at the above-knee position.  Electrocautery was used to divide the fascia and muscle down to the level of the femur.  The neurovascular bundle was ligated.  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  There was a stent present this was removed to allow ligation of the superficial femoral artery.  The femoral vein was ligated and divided.  The sciatic nerve was  ligated proximal to the skin incision and divided.  The femur periosteum was elevated and the femur was divided with a Gigli saw.  The bone edges were smoothed with a bone rasp.  Hemostasis obtained electrocautery.  The wounds were irrigated with saline.  The fascia was closed with a 0 Vicryl suture in a figure-of-eight fashion closing the anterior to the posterior fascia.  The wound again was irrigated and the skin was closed with skin staples.  A sterile dressing and Ace wrap were applied.  The patient was transferred to the recovery room in stable condition   Rosetta Posner, M.D., [facility] 02/22/2019 10:02 AM

## 2019-02-22 NOTE — Interval H&P Note (Signed)
History and Physical Interval Note:  02/22/2019 7:20 AM  Debra Hopkins  has presented today for surgery, with the diagnosis of ESRD.  The various methods of treatment have been discussed with the patient and family. After consideration of risks, benefits and other options for treatment, the patient has consented to  Procedure(s): AMPUTATION BELOW KNEE VS. ABOVE KNEE (Right) as a surgical intervention.  The patient's history has been reviewed, patient examined, no change in status, stable for surgery.  I have reviewed the patient's chart and labs.  Questions were answered to the patient's satisfaction.     Curt Jews

## 2019-02-22 NOTE — Anesthesia Preprocedure Evaluation (Addendum)
Anesthesia Evaluation  Patient identified by MRN, date of birth, ID band Patient awake    Reviewed: Allergy & Precautions, H&P , NPO status , Patient's Chart, lab work & pertinent test results, reviewed documented beta blocker date and time   History of Anesthesia Complications (+) POST - OP SPINAL HEADACHE  Airway Mallampati: II  TM Distance: >3 FB     Dental  (+) Teeth Intact, Dental Advisory Given   Pulmonary shortness of breath, former smoker,  Quit smoking 1987   breath sounds clear to auscultation       Cardiovascular hypertension, Pt. on medications and Pt. on home beta blockers + CAD, + Peripheral Vascular Disease and +CHF   Rhythm:Regular Rate:Normal  Acute RLE ischemia, L fem pseudoaneurysm PVD- plavix; inpt on heparin gtt   Neuro/Psych Carotid artery stenosis TIAnegative psych ROS   GI/Hepatic GERD  Controlled and Medicated,Diverticulosis, hemorrhoids   Endo/Other  diabetes, Type 2, Oral Hypoglycemic Agents, Insulin DependentHypothyroidism   Renal/GU CRF and Renal InsufficiencyRenal diseaseCr 1.6- seems to be AKI on CKD  negative genitourinary   Musculoskeletal  (+) Arthritis , Osteoarthritis,  Fibromyalgia -DDD, fibromyalgia   Abdominal Normal abdominal exam  (+)   Peds  Hematology  (+) anemia ,   Anesthesia Other Findings HLD  Reproductive/Obstetrics negative OB ROS                            Anesthesia Physical  Anesthesia Plan  ASA: III  Anesthesia Plan: General   Post-op Pain Management:    Induction: Intravenous  PONV Risk Score and Plan: 2 and Ondansetron and Treatment may vary due to age or medical condition  Airway Management Planned: Oral ETT  Additional Equipment:   Intra-op Plan:   Post-operative Plan: Extubation in OR  Informed Consent: I have reviewed the patients History and Physical, chart, labs and discussed the procedure including the risks,  benefits and alternatives for the proposed anesthesia with the patient or authorized representative who has indicated his/her understanding and acceptance.     Dental advisory given  Plan Discussed with: CRNA  Anesthesia Plan Comments:         Anesthesia Quick Evaluation

## 2019-02-23 ENCOUNTER — Encounter: Payer: Self-pay | Admitting: *Deleted

## 2019-02-23 LAB — CBC
HCT: 23.2 % — ABNORMAL LOW (ref 36.0–46.0)
Hemoglobin: 7.8 g/dL — ABNORMAL LOW (ref 12.0–15.0)
MCH: 31.2 pg (ref 26.0–34.0)
MCHC: 33.6 g/dL (ref 30.0–36.0)
MCV: 92.8 fL (ref 80.0–100.0)
Platelets: 123 10*3/uL — ABNORMAL LOW (ref 150–400)
RBC: 2.5 MIL/uL — ABNORMAL LOW (ref 3.87–5.11)
RDW: 14.8 % (ref 11.5–15.5)
WBC: 2.9 10*3/uL — ABNORMAL LOW (ref 4.0–10.5)
nRBC: 0 % (ref 0.0–0.2)

## 2019-02-23 LAB — TYPE AND SCREEN
ABO/RH(D): A POS
Antibody Screen: POSITIVE
Unit division: 0
Unit division: 0

## 2019-02-23 LAB — BASIC METABOLIC PANEL
Anion gap: 10 (ref 5–15)
BUN: 17 mg/dL (ref 8–23)
CO2: 17 mmol/L — ABNORMAL LOW (ref 22–32)
Calcium: 8.7 mg/dL — ABNORMAL LOW (ref 8.9–10.3)
Chloride: 116 mmol/L — ABNORMAL HIGH (ref 98–111)
Creatinine, Ser: 1.03 mg/dL — ABNORMAL HIGH (ref 0.44–1.00)
GFR calc Af Amer: 60 mL/min — ABNORMAL LOW (ref >60)
GFR calc non Af Amer: 52 mL/min — ABNORMAL LOW (ref >60)
Glucose, Bld: 138 mg/dL — ABNORMAL HIGH (ref 70–99)
Potassium: 3.6 mmol/L (ref 3.5–5.1)
Sodium: 143 mmol/L (ref 135–145)

## 2019-02-23 LAB — BPAM RBC
Blood Product Expiration Date: 202101142359
Blood Product Expiration Date: 202101152359
Unit Type and Rh: 6200
Unit Type and Rh: 6200

## 2019-02-23 LAB — GLUCOSE, CAPILLARY
Glucose-Capillary: 115 mg/dL — ABNORMAL HIGH (ref 70–99)
Glucose-Capillary: 117 mg/dL — ABNORMAL HIGH (ref 70–99)
Glucose-Capillary: 122 mg/dL — ABNORMAL HIGH (ref 70–99)
Glucose-Capillary: 127 mg/dL — ABNORMAL HIGH (ref 70–99)
Glucose-Capillary: 133 mg/dL — ABNORMAL HIGH (ref 70–99)
Glucose-Capillary: 135 mg/dL — ABNORMAL HIGH (ref 70–99)

## 2019-02-23 MED ORDER — LABETALOL HCL 5 MG/ML IV SOLN
5.0000 mg | Freq: Four times a day (QID) | INTRAVENOUS | Status: DC | PRN
  Administered 2019-02-23 – 2019-02-24 (×3): 5 mg via INTRAVENOUS
  Filled 2019-02-23 (×3): qty 4

## 2019-02-23 NOTE — Progress Notes (Signed)
Inpatient Rehab Admissions:  Inpatient Rehab Consult received.  I met with patient at the bedside for rehabilitation assessment and to discuss goals and expectations of an inpatient rehab admission.  She appears to follow conversation well. Asks that I not contact her family just yet, because she would like to speak to them first.  I will follow up with pt tomorrow.   Signed: Caitlin Warren, PT, DPT Admissions Coordinator [phone] 02/23/19  3:44 PM    

## 2019-02-23 NOTE — Progress Notes (Addendum)
  Progress Note    02/23/2019 7:47 AM 1 Day Post-Op right AKA  Subjective: Expected post-op pain  Alert in NAD  Vitals:   02/23/19 0545 02/23/19 0649  BP: (!) 183/55 (!) 175/43  Pulse:    Resp: 20   Temp:    SpO2:      Physical Exam:  Lungs: non labored breathing Incisions:  Dressing dry and intact   CBC    Component Value Date/Time   WBC 2.9 (L) 02/23/2019 0245   RBC 2.50 (L) 02/23/2019 0245   HGB 7.8 (L) 02/23/2019 0245   HCT 23.2 (L) 02/23/2019 0245   PLT 123 (L) 02/23/2019 0245   MCV 92.8 02/23/2019 0245   MCH 31.2 02/23/2019 0245   MCHC 33.6 02/23/2019 0245   RDW 14.8 02/23/2019 0245   LYMPHSABS 1.6 11/17/2015 1228   MONOABS 0.6 11/17/2015 1228   EOSABS 0.1 11/17/2015 1228   BASOSABS 0.0 11/17/2015 1228    BMET    Component Value Date/Time   NA 143 02/23/2019 0245   NA 137 11/04/2018 1132   K 3.6 02/23/2019 0245   CL 116 (H) 02/23/2019 0245   CO2 17 (L) 02/23/2019 0245   GLUCOSE 138 (H) 02/23/2019 0245   BUN 17 02/23/2019 0245   BUN 24 11/04/2018 1132   CREATININE 1.03 (H) 02/23/2019 0245   CALCIUM 8.7 (L) 02/23/2019 0245   GFRNONAA 52 (L) 02/23/2019 0245   GFRAA 60 (L) 02/23/2019 0245    INR    Component Value Date/Time   INR 1.01 12/18/2016 1508     Intake/Output Summary (Last 24 hours) at 02/23/2019 0747 Last data filed at 02/23/2019 0606 Gross per 24 hour  Intake 2590 ml  Output 650 ml  Net 1940 ml     Assessment:  78 y.o. 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 1 Day Post-Op  Plan: Take down dressing in am. Continue current treatment plan.  PT/OT to eval. Referral to [facility] pending therapy eval. Continue to monitor H and Lacie Scotts, PA-C [facility] [phone] 02/23/2019  7:47 AM    I have interviewed and examined patient with PA and agree with assessment and plan above.   Copelyn Widmer C. [name], MD [facility] [place] Office: [phone] Pager: [phone]

## 2019-02-23 NOTE — Progress Notes (Signed)
Occupational Therapy Treatment Patient Details Name: Debra Hopkins MRN: [id number] DOB: 1940/07/02 Today's Date: 02/23/2019    History of present illness pt is a 78 y/o female with PMH significant for CAD, CA, DDD, DM, macular degeneration, TIA and PVD admitted with acute on chrionic right LE ischemia.  The day prior to admission she underwent stenting of her right SFA and popliteal arteries and now has mottling of the right foot.  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 Upon arrival patient semi-supine in bed, with encouragement patient agreeable to therapy. Patient participate in bed level UB exercises while waiting for RN to provide morphine as patient still reporting 10/10 pain despite medicated prior to session. Patient require max assist x2 for bed mobility supine to sit with verbal cues for sequencing. Patient initially leaning posteriorly with UEs positioned posteriorly. With verbal cues for repositioning + body mechanics patient min guard for sitting balance with unilateral support while performing grooming/hygiene tasks at edge of bed. Patient feeling fatigued however with encouragement agreeable to try standing at edge of bed. With OT blocking L LE and PT positioned on patient's R side patient max A x2 to stand at EOB. Patient tolerate static standing for few seconds before stating she needs to sit back down and that she is urinating. Patient max A x1-2 for return to supine.    Follow Up Recommendations  CIR    Equipment Recommendations  Wheelchair (measurements OT);Wheelchair cushion (measurements OT)       Precautions / Restrictions Precautions Precautions: Fall Restrictions Weight Bearing Restrictions: Yes RLE Weight Bearing: Non weight bearing       Mobility Bed Mobility Overal bed mobility: Needs Assistance Bed Mobility: Supine to  Sit;Sit to Supine;Rolling Rolling: Mod assist   Supine to sit: Max assist;HOB elevated Sit to supine: Max assist   General bed mobility comments: increased pain with mobility requiring maximal assistance x2 with verbal cues for sequencing  Transfers Overall transfer level: Needs assistance     Sit to Stand: Max assist;+2 physical assistance;+2 safety/equipment         General transfer comment: did not attempt to progress beyond sit to stand this session, pt having increased pain, anxious with attempting to stand and feeling fatigued after sitting up EOB for ADLs    Balance Overall balance assessment: Needs assistance Sitting-balance support: Single extremity supported;Feet supported Sitting balance-Leahy Scale: Fair Sitting balance - Comments: minG   Standing balance support: Bilateral upper extremity supported;During functional activity Standing balance-Leahy Scale: Zero Standing balance comment: blocking of L LE and patient holding therapists arms for support; static stand for few seconds before pt states "I need to sit, I'm peeing"                           ADL either performed or assessed with clinical judgement   ADL Overall ADL's : Needs assistance/impaired     Grooming: Oral care;Wash/dry face;Brushing hair;Min guard;Sitting Grooming Details (indicate cue type and reason): relies heavily on unilateral support from L UE while performing ADLs, min guard for safety                             Functional mobility during ADLs: Maximal assistance;+2 for physical assistance;+2 for safety/equipment;Cueing for safety;Cueing for sequencing(to attempt standing)  Cognition Arousal/Alertness: Awake/alert Behavior During Therapy: Anxious(tearful) Overall Cognitive Status: No family/caregiver present to determine baseline cognitive functioning                                 General Comments: patient requires  encouragement/reassurance to attempt standing, is tearful with movement due to pain        Exercises Exercises: General Upper Extremity General Exercises - Upper Extremity Shoulder Flexion: 20 reps;Both;AROM;Supine Elbow Flexion: 20 reps;AROM;Both;Supine Elbow Extension: 20 reps;Both;AROM;Supine Digit Composite Flexion: 20 reps;AROM;Both;Supine Composite Extension: Both;AROM;Supine(20 reps)           Pertinent Vitals/ Pain       Pain Assessment: 0-10 Pain Score: 9  Pain Location: R LE pain Pain Descriptors / Indicators: Discomfort;Moaning;Guarding;Sore Pain Intervention(s): Premedicated before session;RN gave pain meds during session(provided additional IV morphine during session)         Frequency  Min 2X/week        Progress Toward Goals  OT Goals(current goals can now be found in the care plan section)  Progress towards OT goals: Progressing toward goals  Acute Rehab OT Goals Patient Stated Goal: improve mobility OT Goal Formulation: With patient Time For Goal Achievement: 03/03/19 Potential to Achieve Goals: Good ADL Goals Pt Will Perform Grooming: with min guard assist;with supervision;sitting Pt Will Perform Upper Body Dressing: with supervision;sitting Pt Will Perform Lower Body Dressing: with min guard assist;bed level;sitting/lateral leans Pt Will Transfer to Toilet: with min assist;stand pivot transfer Pt/caregiver will Perform Home Exercise Program: Increased strength;Both right and left upper extremity;With Supervision Additional ADL Goal #1: Pt will follow multi-step commands with 100% accuracy in 3/5 trials.  Plan Discharge plan remains appropriate    Co-evaluation    PT/OT/SLP Co-Evaluation/Treatment: Yes Reason for Co-Treatment: Complexity of the patient's impairments (multi-system involvement);For patient/therapist safety;To address functional/ADL transfers   OT goals addressed during session: ADL's and self-care      AM-PAC OT "6 Clicks"  Daily Activity     Outcome Measure   Help from another person eating meals?: A Little Help from another person taking care of personal grooming?: A Little Help from another person toileting, which includes using toliet, bedpan, or urinal?: Total Help from another person bathing (including washing, rinsing, drying)?: A Lot Help from another person to put on and taking off regular upper body clothing?: A Lot Help from another person to put on and taking off regular lower body clothing?: Total 6 Click Score: 12    End of Session    OT Visit Diagnosis: Other abnormalities of gait and mobility (R26.89);Muscle weakness (generalized) (M62.81);Pain Pain - Right/Left: Right Pain - part of body: Leg   Activity Tolerance Patient tolerated treatment well   Patient Left in bed;with call bell/phone within reach;with bed alarm set   Nurse Communication Mobility status        Time: [id number] OT Time Calculation (min): 54 min  Charges: OT General Charges $OT Visit: 1 Visit OT Treatments $Self Care/Home Management : 8-22 mins $Therapeutic Exercise: 8-22 mins  Shon Millet OT OT office: [place] 02/23/2019, 2:47 PM

## 2019-02-23 NOTE — Anesthesia Postprocedure Evaluation (Signed)
Anesthesia Post Note  Patient: Debra Hopkins  Procedure(s) Performed: RIGHT ABOVE KNEE AMPUTATION (Right Knee)     Patient location during evaluation: PACU Anesthesia Type: General Level of consciousness: awake and alert Pain management: pain level controlled Vital Signs Assessment: post-procedure vital signs reviewed and stable Respiratory status: spontaneous breathing, nonlabored ventilation, respiratory function stable and patient connected to nasal cannula oxygen Cardiovascular status: blood pressure returned to baseline and stable Postop Assessment: no apparent nausea or vomiting Anesthetic complications: no    Last Vitals:  Vitals:   02/23/19 0649 02/23/19 0837  BP: (!) 175/43 (!) 150/90  Pulse:    Resp:    Temp:    SpO2:      Last Pain:  Vitals:   02/23/19 0940  TempSrc:   PainSc: 7                  Montez Hageman

## 2019-02-23 NOTE — Progress Notes (Signed)
Spoke with RN and made her aware that the [facility] dressing date was okay and the needle is not due to be changed until the 19th

## 2019-02-23 NOTE — Evaluation (Signed)
Physical Therapy RE- Evaluation Patient Details Name: Debra Hopkins MRN: [id number] DOB: 1940/09/13 Today's Date: 02/23/2019   History of Present Illness  pt is a 78 y/o female with PMH significant for CAD, CA, DDD, DM, macular degeneration, TIA and PVD admitted with acute on chrionic right LE ischemia.  The day prior to admission she underwent stenting of her right SFA and popliteal arteries and now has mottling of the right foot.  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 Pt continues with confusion despite orientation to hospital and having R AKA. Pt with increased anxiety and fear regarding onset of R LE pain however with max encouragement participated in PT/OT re-evaluation. Pt maxAx2 for all transfers. Pt was able to sit EOB x10 min to work with OT on ADLs while PT focused on trunk control/balance to allow pt to use bilat hands to brush teeth. We did 1 trial of standing however pt began to urinate. Pt tolerated activity well 1 day post op. Recommend CIR upon d/c for maximal functional recovery. Did discuss with patient that she will be in a w/c predominately until she can get a prosthesis. Unsure if her families homes will be w/c accessible. Pt may need to go to SNF if family can not make home w/c accessible.    Follow Up Recommendations CIR;Other (comment)    Equipment Recommendations  Wheelchair (measurements PT);Wheelchair cushion (measurements PT)    Recommendations for Other Services Rehab consult     Precautions / Restrictions Precautions Precautions: Fall Precaution Comments: R AKA, in ace wrap Restrictions Weight Bearing Restrictions: Yes RLE Weight Bearing: Non weight bearing      Mobility  Bed Mobility Overal bed mobility: Needs Assistance Bed Mobility: Supine to Sit;Sit to Supine Rolling: Max assist   Supine to sit: Max  assist;+2 for physical assistance;HOB elevated Sit to supine: Max assist;+2 for physical assistance   General bed mobility comments: pt with increased anxiety/fear of onset of pain. Pt requiring maxA with trunk elevation and to pivot hips to EOB, maxA to return to supine  Transfers Overall transfer level: Needs assistance     Sit to Stand: Max assist;+2 physical assistance         General transfer comment: pt required max encouragement to complete stand, used bed pad under hips and gait belt, pt able to achieve full standing however unable to tolerate >5 sec and had episode of urinary incontinence  Ambulation/Gait                Stairs            Wheelchair Mobility    Modified Rankin (Stroke Patients Only)       Balance Overall balance assessment: Needs assistance Sitting-balance support: Single extremity supported;Feet supported Sitting balance-Leahy Scale: Fair Sitting balance - Comments: minG   Standing balance support: Bilateral upper extremity supported;During functional activity Standing balance-Leahy Scale: Zero Standing balance comment: blocking of L LE and patient holding therapists arms for support; static stand for few seconds before pt states "I need to sit, I'm peeing"                             Pertinent Vitals/Pain Pain Assessment: 0-10 Pain Score: 10-Worst pain ever Pain Location: R LE pain Pain Descriptors / Indicators: Discomfort;Moaning;Guarding;Sore Pain Intervention(s): Premedicated before session(given morphine)  Home Living                        Prior Function                 Hand Dominance        Extremity/Trunk Assessment   Upper Extremity Assessment Upper Extremity Assessment: Generalized weakness    Lower Extremity Assessment Lower Extremity Assessment: Defer to PT evaluation       Communication      Cognition Arousal/Alertness: Awake/alert Behavior During Therapy:  Anxious Overall Cognitive Status: No family/caregiver present to determine baseline cognitive functioning Area of Impairment: Orientation;Problem solving                   Current Attention Level: Sustained         Problem Solving: Slow processing;Decreased initiation;Requires verbal cues;Requires tactile cues General Comments: pt with noted confusion in regard to time of day, pt oriented to surgery and being in hospital      General Comments General comments (skin integrity, edema, etc.): pt with dressing on R AKA incision, re-wrapped ace wrap to provide optimal support/compression. pt educated on phantom limb pain and how to manage it    Exercises General Exercises - Upper Extremity Shoulder Flexion: 20 reps;Both;AROM;Supine Elbow Flexion: 20 reps;AROM;Both;Supine Elbow Extension: 20 reps;Both;AROM;Supine Digit Composite Flexion: 20 reps;AROM;Both;Supine Composite Extension: Both;AROM;Supine(20 reps)   Assessment/Plan    PT Assessment    PT Problem List         PT Treatment Interventions      PT Goals (Current goals can be found in the Care Plan section)  Acute Rehab PT Goals Patient Stated Goal: improve mobility PT Goal Formulation: With patient Time For Goal Achievement: 03/09/19 Potential to Achieve Goals: Fair    Frequency Min 3X/week   Barriers to discharge        Co-evaluation PT/OT/SLP Co-Evaluation/Treatment: Yes Reason for Co-Treatment: Complexity of the patient's impairments (multi-system involvement) PT goals addressed during session: Mobility/safety with mobility OT goals addressed during session: ADL's and self-care       AM-PAC PT "6 Clicks" Mobility  Outcome Measure Help needed turning from your back to your side while in a flat bed without using bedrails?: A Lot Help needed moving from lying on your back to sitting on the side of a flat bed without using bedrails?: A Lot Help needed moving to and from a bed to a chair (including a  wheelchair)?: Total Help needed standing up from a chair using your arms (e.g., wheelchair or bedside chair)?: Total Help needed to walk in hospital room?: Total Help needed climbing 3-5 steps with a railing? : Total 6 Click Score: 8    End of Session Equipment Utilized During Treatment: Gait belt Activity Tolerance: Patient tolerated treatment well Patient left: in bed;with call bell/phone within reach;with bed alarm set Nurse Communication: Mobility status PT Visit Diagnosis: Other abnormalities of gait and mobility (R26.89);Pain;Difficulty in walking, not elsewhere classified (R26.2) Pain - Right/Left: Right Pain - part of body: Leg    Time: [id number] PT Time Calculation (min) (ACUTE ONLY): 40 min   Charges:   PT Evaluation $PT Re-evaluation: 1 Re-eval PT Treatments $Therapeutic Activity: 8-22 mins        Kittie Plater, PT, DPT Acute Rehabilitation Services Pager #: [phone] Office #: [phone]   Debra Hopkins 02/23/2019, 3:07 PM

## 2019-02-23 NOTE — Progress Notes (Signed)
B.P. 180/59 , P. 82 Dr. Oneida Alar called and made aware of patient B.P. being up. See orders for Labetalol 5 mg q 6 hrs prn S.B.P. >170 and H.R. greater than 60.

## 2019-02-24 ENCOUNTER — Encounter (HOSPITAL_COMMUNITY): Payer: Self-pay | Admitting: Vascular Surgery

## 2019-02-24 LAB — BASIC METABOLIC PANEL
Anion gap: 7 (ref 5–15)
BUN: 16 mg/dL (ref 8–23)
CO2: 20 mmol/L — ABNORMAL LOW (ref 22–32)
Calcium: 8.5 mg/dL — ABNORMAL LOW (ref 8.9–10.3)
Chloride: 115 mmol/L — ABNORMAL HIGH (ref 98–111)
Creatinine, Ser: 1.03 mg/dL — ABNORMAL HIGH (ref 0.44–1.00)
GFR calc Af Amer: 60 mL/min — ABNORMAL LOW (ref >60)
GFR calc non Af Amer: 52 mL/min — ABNORMAL LOW (ref >60)
Glucose, Bld: 92 mg/dL (ref 70–99)
Potassium: 3.2 mmol/L — ABNORMAL LOW (ref 3.5–5.1)
Sodium: 142 mmol/L (ref 135–145)

## 2019-02-24 LAB — CBC
HCT: 20 % — ABNORMAL LOW (ref 36.0–46.0)
Hemoglobin: 6.4 g/dL — CL (ref 12.0–15.0)
MCH: 30.6 pg (ref 26.0–34.0)
MCHC: 32 g/dL (ref 30.0–36.0)
MCV: 95.7 fL (ref 80.0–100.0)
Platelets: 117 10*3/uL — ABNORMAL LOW (ref 150–400)
RBC: 2.09 MIL/uL — ABNORMAL LOW (ref 3.87–5.11)
RDW: 14.8 % (ref 11.5–15.5)
WBC: 2.4 10*3/uL — ABNORMAL LOW (ref 4.0–10.5)
nRBC: 0 % (ref 0.0–0.2)

## 2019-02-24 LAB — HEMOGLOBIN AND HEMATOCRIT, BLOOD
HCT: 27.1 % — ABNORMAL LOW (ref 36.0–46.0)
Hemoglobin: 9.3 g/dL — ABNORMAL LOW (ref 12.0–15.0)

## 2019-02-24 LAB — GLUCOSE, CAPILLARY
Glucose-Capillary: 102 mg/dL — ABNORMAL HIGH (ref 70–99)
Glucose-Capillary: 113 mg/dL — ABNORMAL HIGH (ref 70–99)
Glucose-Capillary: 120 mg/dL — ABNORMAL HIGH (ref 70–99)
Glucose-Capillary: 90 mg/dL (ref 70–99)
Glucose-Capillary: 97 mg/dL (ref 70–99)
Glucose-Capillary: 98 mg/dL (ref 70–99)

## 2019-02-24 LAB — PREPARE RBC (CROSSMATCH)

## 2019-02-24 MED ORDER — PREGABALIN 25 MG PO CAPS
25.0000 mg | ORAL_CAPSULE | Freq: Three times a day (TID) | ORAL | Status: DC | PRN
  Administered 2019-02-25 – 2019-03-01 (×5): 25 mg via ORAL
  Filled 2019-02-24 (×5): qty 1

## 2019-02-24 MED ORDER — SODIUM CHLORIDE 0.9% IV SOLUTION: Freq: Once | INTRAVENOUS | Status: AC

## 2019-02-24 MED ORDER — HYDRALAZINE HCL 25 MG PO TABS: 25.0000 mg | ORAL_TABLET | Freq: Every day | ORAL | Status: DC

## 2019-02-24 MED ORDER — INSULIN ASPART 100 UNIT/ML ~~LOC~~ SOLN
0.0000 [IU] | Freq: Three times a day (TID) | SUBCUTANEOUS | Status: DC
  Administered 2019-02-26 – 2019-02-28 (×4): 1 [IU] via SUBCUTANEOUS

## 2019-02-24 MED ORDER — NIFEDIPINE ER OSMOTIC RELEASE 30 MG PO TB24
30.0000 mg | ORAL_TABLET | Freq: Every day | ORAL | Status: DC
  Administered 2019-02-24 – 2019-03-02 (×7): 30 mg via ORAL
  Filled 2019-02-24 (×7): qty 1

## 2019-02-24 MED ORDER — LOSARTAN POTASSIUM 50 MG PO TABS
100.0000 mg | ORAL_TABLET | Freq: Every day | ORAL | Status: DC
  Administered 2019-02-24 – 2019-03-02 (×7): 100 mg via ORAL
  Filled 2019-02-24 (×7): qty 2

## 2019-02-24 MED ORDER — TORSEMIDE 20 MG PO TABS
20.0000 mg | ORAL_TABLET | Freq: Every day | ORAL | Status: DC
  Administered 2019-02-24 – 2019-03-02 (×7): 20 mg via ORAL
  Filled 2019-02-24 (×7): qty 1

## 2019-02-24 MED ORDER — CLONIDINE HCL 0.2 MG PO TABS
0.2000 mg | ORAL_TABLET | Freq: Every day | ORAL | Status: DC
  Administered 2019-02-24 – 2019-03-01 (×6): 0.2 mg via ORAL
  Filled 2019-02-24 (×6): qty 1

## 2019-02-24 NOTE — Progress Notes (Signed)
Inpatient Rehab Admissions Coordinator:   I was able to speak with pt and her son (via phone) they are open to CIR if insurance will approve and when bed available.  I will open insurance authorization request today and follow for timing of possible admission pending approval and bed availability.   Shann Medal, PT, DPT Admissions Coordinator [phone] 02/24/19  1:47 PM

## 2019-02-24 NOTE — Progress Notes (Signed)
Physical Therapy Treatment Patient Details Name: Debra Hopkins MRN: [id number] DOB: February 03, 1941 Today's Date: 02/24/2019    History of Present Illness pt is a 78 y/o female with PMH significant for CAD, CA, DDD, DM, macular degeneration, TIA and PVD admitted with acute on chrionic right LE ischemia.  The day prior to admission she underwent stenting of her right SFA and popliteal arteries and now has mottling of the right foot.  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 This session focused on bed mobility and sitting balance EOB. Pt requires +2 assist for bed mobility and bilat UE support to maintain sitting balance. Continue to progress as tolerated.    Follow Up Recommendations  CIR;Other (comment)     Equipment Recommendations  Wheelchair (measurements PT);Wheelchair cushion (measurements PT)    Recommendations for Other Services Rehab consult     Precautions / Restrictions Precautions Precautions: Fall Restrictions Weight Bearing Restrictions: Yes RLE Weight Bearing: Non weight bearing    Mobility  Bed Mobility Overal bed mobility: Needs Assistance Bed Mobility: Supine to Sit;Sit to Supine     Supine to sit: Max assist;+2 for physical assistance;HOB elevated Sit to supine: +2 for physical assistance;Total assist   General bed mobility comments: increased time needed; cues for seuqencing and assistance required for all bed mobility using bed pad   Transfers                    Ambulation/Gait                 Stairs             Wheelchair Mobility    Modified Rankin (Stroke Patients Only)       Balance Overall balance assessment: Needs assistance Sitting-balance support: Feet supported;Bilateral upper extremity supported Sitting balance-Leahy Scale: Poor Sitting balance -  Comments: worked on sitting EOB and bring trunk anterior with decreased UE support; pt requires bilat UE to maintain sitting EOB                                     Cognition Arousal/Alertness: Awake/alert Behavior During Therapy: Anxious Overall Cognitive Status: No family/caregiver present to determine baseline cognitive functioning                                 General Comments: pt following commands consitently with increased time and verbal/tactile cues; pt confused at times and speaking off topic and asking who is laughing in the room althogh on one is laughing      Exercises      General Comments        Pertinent Vitals/Pain Pain Assessment: Faces Faces Pain Scale: Hurts whole lot Pain Location: bilat LE pain (L LE with tactile input) Pain Descriptors / Indicators: Discomfort;Moaning;Guarding;Sore Pain Intervention(s): Limited activity within patient's tolerance;Monitored during session;Repositioned;Premedicated before session    [place] expects to be discharged to:: Private residence Living Arrangements: Alone                  Prior Function            PT Goals (current goals can now be found in the care plan section) Progress  towards PT goals: Progressing toward goals    Frequency    Min 3X/week      PT Plan Current plan remains appropriate    Co-evaluation              AM-PAC PT "6 Clicks" Mobility   Outcome Measure  Help needed turning from your back to your side while in a flat bed without using bedrails?: A Lot Help needed moving from lying on your back to sitting on the side of a flat bed without using bedrails?: A Lot Help needed moving to and from a bed to a chair (including a wheelchair)?: Total Help needed standing up from a chair using your arms (e.g., wheelchair or bedside chair)?: Total Help needed to walk in hospital room?: Total Help needed climbing 3-5 steps with a railing?  : Total 6 Click Score: 8    End of Session   Activity Tolerance: Patient limited by pain Patient left: in bed;with call bell/phone within reach;with bed alarm set Nurse Communication: Mobility status PT Visit Diagnosis: Other abnormalities of gait and mobility (R26.89);Pain;Difficulty in walking, not elsewhere classified (R26.2) Pain - Right/Left: Right Pain - part of body: Leg     Time: 1459-1533 PT Time Calculation (min) (ACUTE ONLY): 34 min  Charges:  $Therapeutic Activity: 23-37 mins                     Earney Navy, PTA Acute Rehabilitation Services Pager: [phone] Office: [phone]     Darliss Cheney 02/24/2019, 5:22 PM

## 2019-02-24 NOTE — Progress Notes (Signed)
Orthopedic Tech Progress Note Patient Details:  Debra Hopkins 1940/11/29 [id number] Called in order to [facility] for a AKA RETENTION SOCK Patient ID: [name], female   DOB: September 23, 1940, 78 y.o.   MRN: [id number]   Janit Pagan 02/24/2019, 8:18 AM

## 2019-02-24 NOTE — Progress Notes (Signed)
CRITICAL VALUE ALERT  Critical Value:  6.4 Hgb   Date & Time Notied:  02/24/2019 0400  Provider Notified: Scot Dock, MD  Orders Received/Actions taken: 2 units of blood

## 2019-02-24 NOTE — Progress Notes (Addendum)
  Progress Note    02/24/2019 7:45 AM 2 Days Post-Op  Subjective:  No complaints this morning Alert in NAD  Vitals:   02/23/19 1951 02/24/19 0400  BP: (!) 150/56 (!) 176/69  Pulse: 75 82  Resp: 12 19  Temp: 98.2 F (36.8 C) 97.7 F (36.5 C)  SpO2: 98% 99%    Physical Exam: Cardiac:   Lungs:  Non labored Incisions:  Right aka incision healing without signs of infection; right thigh incision ok, minimal edema Extremities:  Right groin incision with small amount of clear drainiage and mild erythema; left groin incision OK Abdomen:    CBC    Component Value Date/Time   WBC 2.4 (L) 02/24/2019 0321   RBC 2.09 (L) 02/24/2019 0321   HGB 6.4 (LL) 02/24/2019 0321   HCT 20.0 (L) 02/24/2019 0321   PLT 117 (L) 02/24/2019 0321   MCV 95.7 02/24/2019 0321   MCH 30.6 02/24/2019 0321   MCHC 32.0 02/24/2019 0321   RDW 14.8 02/24/2019 0321   LYMPHSABS 1.6 11/17/2015 1228   MONOABS 0.6 11/17/2015 1228   EOSABS 0.1 11/17/2015 1228   BASOSABS 0.0 11/17/2015 1228    BMET    Component Value Date/Time   NA 142 02/24/2019 0321   NA 137 11/04/2018 1132   K 3.2 (L) 02/24/2019 0321   CL 115 (H) 02/24/2019 0321   CO2 20 (L) 02/24/2019 0321   GLUCOSE 92 02/24/2019 0321   BUN 16 02/24/2019 0321   BUN 24 11/04/2018 1132   CREATININE 1.03 (H) 02/24/2019 0321   CALCIUM 8.5 (L) 02/24/2019 0321   GFRNONAA 52 (L) 02/24/2019 0321   GFRAA 60 (L) 02/24/2019 0321    INR    Component Value Date/Time   INR 1.01 12/18/2016 1508     Intake/Output Summary (Last 24 hours) at 02/24/2019 0745 Last data filed at 02/24/2019 0509 Gross per 24 hour  Intake 13 ml  Output 1300 ml  Net -1287 ml     Assessment:  78 y.o. female is s/p: left CFA pseudo repair, right fem - tp bypass with subsequent right aka due to ischemic foot. No signs of active bleeding or hematoma  Noted drop in Hg with 2 units transfusion pending 2 Days Post-Op  Plan: -Transfused as ordered Right residual limb  redresssed; retention sock to be placed PT/OT rec: CIR.  Continues to follow    Risa Grill, PA-C [facility] [phone] 02/24/2019  7:45 AM.  I have independently interviewed and examined patient and agree with PA assessment and plan above.   Abu Heavin C. [name], MD [facility] [place] Office: [phone] Pager: [phone]

## 2019-02-25 ENCOUNTER — Encounter (HOSPITAL_COMMUNITY): Payer: Self-pay | Admitting: Vascular Surgery

## 2019-02-25 LAB — GLUCOSE, CAPILLARY
Glucose-Capillary: 105 mg/dL — ABNORMAL HIGH (ref 70–99)
Glucose-Capillary: 115 mg/dL — ABNORMAL HIGH (ref 70–99)
Glucose-Capillary: 112 mg/dL — ABNORMAL HIGH (ref 70–99)
Glucose-Capillary: 104 mg/dL — ABNORMAL HIGH (ref 70–99)

## 2019-02-25 LAB — CBC
HCT: 26 % — ABNORMAL LOW (ref 36.0–46.0)
Hemoglobin: 8.8 g/dL — ABNORMAL LOW (ref 12.0–15.0)
MCH: 30.6 pg (ref 26.0–34.0)
MCHC: 33.8 g/dL (ref 30.0–36.0)
MCV: 90.3 fL (ref 80.0–100.0)
Platelets: 92 10*3/uL — ABNORMAL LOW (ref 150–400)
RBC: 2.88 MIL/uL — ABNORMAL LOW (ref 3.87–5.11)
RDW: 15 % (ref 11.5–15.5)
WBC: 2.4 10*3/uL — ABNORMAL LOW (ref 4.0–10.5)
nRBC: 0 % (ref 0.0–0.2)

## 2019-02-25 LAB — TYPE AND SCREEN
ABO/RH(D): A POS
Antibody Screen: NEGATIVE
Unit division: 0
Unit division: 0

## 2019-02-25 LAB — BPAM RBC
Blood Product Expiration Date: 202101092359
Blood Product Expiration Date: 202101092359
ISSUE DATE / TIME: 202012161010
ISSUE DATE / TIME: 202012161346
Unit Type and Rh: 6200
Unit Type and Rh: 6200

## 2019-02-25 LAB — SURGICAL PATHOLOGY

## 2019-02-25 NOTE — H&P (Signed)
Physical Medicine and Rehabilitation Admission H&P     HPI: Debra Hopkins is a 78 year old right-handed female history of CAD, diabetes mellitus, TIA, left CEA 2008, fibromyalgia, macular degeneration, peripheral vascular disease with multiple revascularization procedures and maintained on low-dose aspirin.  History taken from chart review and patient.  Presented on 02/15/2019 with chronic RLE ischemic changes and toe ulceration. Recent aortogram showed aortic and iliac segments heavily calcified.  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 [name] on 02/15/2019.  Noted postoperatively mottling of the right foot started on heparin drip.  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 Donnetta Hutching.  Hospital course further complicated by acute blood loss anemia patient was transfused 2 units with latest hemoglobin 9.5.  Subcutaneous heparin was initiated for DVT prophylaxis 02/23/2019 as well as remaining on low-dose aspirin.  Vancomycin resumed 03/01/2023 wound coverage.  Therapy evaluations completed and patient was admitted for a comprehensive rehab program.   Pt's staples in R thigh and B/L groins removed today- per nursing note, too moist with drainage- possible infection to place steristrips, although would benefit from them.  Pt also notes that she had episode of hallucinations/confusion a few days ago and so meds were reduced- was on lyrica at home- doesn't remember dose- was on it TID SCHEDULED, not prn, though, like current dosage.  LBM "this evening" - thought it was evening, not early afternoon and didn't clear with reminding her of time- pain rated also 7-8/10, even after pain meds IV morphine given,  for staples removal, since so painful- Also c/o SEVERE pain/muscle/sensitivity of L calf- since before was in hospital- barely able to let me touch the L calf at all. Does also say phantom pain better than was initially. Admits to whistling and humming to help with pain.   Review of Systems  Constitutional: Negative for chills and fever.  HENT: Positive for hearing loss.   Eyes: Negative for blurred vision and double vision.  Respiratory: Negative for cough.        Dyspnea on exertion  Cardiovascular: Positive for leg swelling. Negative for chest pain and palpitations.  Gastrointestinal: Positive for constipation. Negative for heartburn, nausea and vomiting.       GERD  Genitourinary: Negative for dysuria and flank pain.  Musculoskeletal: Positive for joint pain and myalgias.  Skin: Negative for rash.  Neurological: Positive for focal weakness and weakness. Negative for sensory change.  Psychiatric/Behavioral:       Anxiety  All other systems reviewed and are negative.  Past Medical History:  Diagnosis Date  . Arthritis   . CAD (coronary artery disease)   . Cancer ([place])   . Carotid artery occlusion   . Cataracts, bilateral   . DDD (degenerative disc disease)   . Diabetes mellitus    Type 2  . Diverticulitis   . Dyspnea   . Fibromyalgia   . GERD (gastroesophageal reflux disease)   . Heart murmur   . Hemorrhoids   . History of shingles   . Impaired hearing   . Macular degeneration   . Other and unspecified hyperlipidemia   . PVD (peripheral vascular disease) ([place])   . TIA (transient ischemic attack)   . Unspecified essential hypertension   .  Varicose veins    Past Surgical History:  Procedure Laterality Date  . ABDOMINAL AORTOGRAM W/LOWER EXTREMITY Bilateral 02/15/2019   Procedure: ABDOMINAL AORTOGRAM W/LOWER EXTREMITY;  Surgeon: Waynetta Sandy, MD;  Location: [facility];  Service: Cardiovascular;  Laterality: Bilateral;  . AMPUTATION Right 02/22/2019     Procedure: RIGHT ABOVE KNEE AMPUTATION;  Surgeon: Rosetta Posner, MD;  Location: [facility];  Service: Vascular;  Laterality: Right;  . ANGIOPLASTY     left leg 2003  . APPENDECTOMY    . aspiration of a cyst in the right breast    . BALLOON DILATION N/A 10/01/2013   Procedure: BALLOON DILATION;  Surgeon: Rogene Houston, MD;  [facility];  Service: Endoscopy;  Laterality: N/A;  . BREAST BIOPSY    . BREAST BIOPSY Left   . CARDIAC CATHETERIZATION    . CAROTID ENDARTERECTOMY  12-03-06   left CEA  . CATARACT EXTRACTION W/PHACO Right 10/14/2013   Procedure: CATARACT EXTRACTION PHACO AND INTRAOCULAR LENS PLACEMENT (IOC);  Surgeon: Tonny Branch, MD;  Location: [facility];  Service: Ophthalmology;  Laterality: Right;  CDE 7.20  . CATARACT EXTRACTION W/PHACO Left 11/04/2013   Procedure: CATARACT EXTRACTION PHACO AND INTRAOCULAR LENS PLACEMENT (IOC);  Surgeon: Tonny Branch, MD;  [facility]: [facility];  Service: Ophthalmology;  Laterality: Left;  CDE 8.57  . COLONOSCOPY N/A 09/19/2014   Procedure: COLONOSCOPY;  Surgeon: Rogene Houston, MD;  [facility];  Service: Endoscopy;  Laterality: N/A;  7:30-8:30  . CYSTO WITH HYDRODISTENSION N/A 03/29/2016   Procedure: CYSTOSCOPY/HYDRODISTENSION;  Surgeon: Irine Seal, MD;  [facility];  Service: Urology;  Laterality: N/A;  . CYSTOSCOPY WITH INJECTION N/A 03/29/2016   Procedure: CYSTOSCOPY WITH PYRIDIUM AND MARCAINE INSTALLATION;  Surgeon: Irine Seal, MD;  [facility];  Service: Urology;  Laterality: N/A;  . ESOPHAGOGASTRODUODENOSCOPY N/A 10/01/2013   Procedure: ESOPHAGOGASTRODUODENOSCOPY (EGD);  Surgeon: Rogene Houston, MD;  [facility];  Service: Endoscopy;  Laterality: N/A;  120  . ESOPHAGOGASTRODUODENOSCOPY N/A 10/24/2014   Procedure: ESOPHAGOGASTRODUODENOSCOPY (EGD);  Surgeon: Rogene Houston, MD;  [facility];  Service: Endoscopy;  Laterality: N/A;  730  . FALSE ANEURYSM REPAIR Left 02/16/2019   Procedure: REPAIR LEFT  FEMORAL PSEUDOANEURYSM;  Surgeon: Waynetta Sandy, MD;  Location: [place] [facility];  Service: Vascular;  Laterality: Left;  . FEMORAL-POPLITEAL BYPASS GRAFT Right 02/16/2019   Procedure: RIGHT BYPASS GRAFT FEMORAL-POPLITEAL ARTERY USING NON-REVERSED RIGHT GREATER SAPHENOUS VEIN;  Surgeon: Waynetta Sandy, MD;  Location: [place];  Service: Vascular;  Laterality: Right;  . GIVENS CAPSULE STUDY N/A 10/24/2014   Procedure: GIVENS CAPSULE STUDY;  Surgeon: Rogene Houston, MD;  [facility];  Service: Endoscopy;  Laterality: N/A;  . JOINT REPLACEMENT  2011   Right total knee replacement  . [place]  . KNEE ARTHROSCOPY  2009  . LOWER EXTREMITY ANGIOGRAM Right 02/16/2019   Procedure: Lower Extremity Angiogram;  Surgeon: Waynetta Sandy, MD;  Location: [place];  Service: Vascular;  Laterality: Right;  . MALONEY DILATION N/A 10/01/2013   Procedure: Venia Minks DILATION;  Surgeon: Rogene Houston, MD;  [facility];  Service: Endoscopy;  Laterality: N/A;  . PERIPHERAL VASCULAR INTERVENTION Right 02/15/2019   Procedure: PERIPHERAL VASCULAR INTERVENTION;  Surgeon: Waynetta Sandy, MD;  Location: [facility];  Service: Cardiovascular;  Laterality: Right;  external iliac, SFA, popliteal  . SAVORY DILATION N/A 10/01/2013   Procedure: SAVORY DILATION;  Surgeon: Rogene Houston, MD;  [facility];  Service: Endoscopy;  Laterality: N/A;  . SPINE SURGERY  1997   C5-c6 fusion  . TOTAL ABDOMINAL HYSTERECTOMY  1973   Family History  Problem Relation Age of Onset  . Stroke Mother   . Hypertension Mother   . Stroke Father   . Hypertension Father   . Diabetes Father   . Coronary artery disease Father   . Heart disease Father   . Diabetes Sister   . Hypertension Sister   . Lung cancer Brother   . Sinusitis Brother   . Healthy Brother   . Diabetes Son   . Hypertension Son   . Coronary artery disease Other    Social History:  reports  that she quit smoking about 33 years ago. Her smoking use included cigarettes. She has never used smokeless tobacco. She reports that she does not drink alcohol or use drugs. Allergies:  Allergies  Allergen Reactions  . Amoxicillin-Pot Clavulanate Hives and Other (See Comments)    Has patient had a PCN reaction causing immediate rash, facial/tongue/throat swelling, SOB or lightheadedness with hypotension: UNKNOWN Has patient had a PCN reaction causing severe rash involving mucus membranes or skin necrosis: UNKNOWN Has patient had a PCN reaction that required hospitalization No Has patient had a PCN reaction occurring within the last 10 years: No If all of the above answers are "NO", then may proceed with Cephalosporin use.   . Byetta 10 Mcg Pen [Exenatide] Other (See Comments)    Unknown  . Ceclor [Cefaclor] Other (See Comments)    Unknown  . Celexa [Citalopram Hydrobromide] Other (See Comments)    Sick  . Ciprofloxacin Nausea Only and Other (See Comments)    Dizziness  . Claritin [Loratadine] Other (See Comments)    Hair fell out  . Erythromycin Other (See Comments)    REACTION: Upset stomach  . Flexeril [Cyclobenzaprine] Other (See Comments)    Nervous  . Fosamax [Alendronate] Other (See Comments)    Made pt sick  . Jardiance [Empagliflozin] Other (See Comments)    Vaginal infection, UTI  . Motrin [Ibuprofen] Nausea Only  . Naproxen Other (See Comments)    REACTION: severe nausea  . Norvasc [Amlodipine Besylate] Other (See Comments)    Unknown  . Prednisone Other (See Comments)    REACTION: itchy rash  . Prozac [Fluoxetine Hcl] Other (See Comments)    Sick  . Sulfamethoxazole-Trimethoprim Nausea And Vomiting and Other (See Comments)    REACTION: Upset stomach   Medications Prior to Admission  Medication Sig Dispense Refill  . allopurinol (ZYLOPRIM) 100 MG tablet Take 100 mg by mouth daily as needed (Gout).     Marland Kitchen aspirin EC 81 MG tablet Take 81 mg by mouth daily.    .  Boswellia-Glucosamine-Vit D (OSTEO BI-FLEX ONE PER DAY PO) Take 1 tablet by mouth every other day.     . Calcium Citrate 250 MG TABS Take 200 mg of elemental calcium by mouth daily.    . Carboxymeth-Glycerin-Polysorb (REFRESH OPTIVE ADVANCED) 0.5-1-0.5 % SOLN Apply 1 drop to eye daily as needed (for dry eyes).     . Cholecalciferol (VITAMIN D3) 50 MCG (2000 UT) TABS Take 2,000 Units by mouth daily.     . clobetasol (TEMOVATE) 0.05 % external solution Apply 1 application topically daily as needed (eczema).    . cloNIDine (CATAPRES) 0.1 MG tablet Take 0.2 mg by mouth at bedtime.     Marland Kitchen esomeprazole (NEXIUM) 20 MG  capsule Take 20 mg by mouth daily before breakfast.     . fluticasone (CUTIVATE) 0.05 % cream Apply 1 application topically daily as needed (Eczema in ears).    . hydrALAZINE (APRESOLINE) 25 MG tablet Take 25 mg by mouth at bedtime.     . Insulin Glargine (LANTUS SOLOSTAR) 100 UNIT/ML Solostar Pen INJECT 30 UNITS INTO THE SKIN EVERY DAY AT 10:00 PM (Patient taking differently: Inject 30 Units into the skin at bedtime. AT 10:00 PM) 15 mL 2  . ketotifen (ALAWAY) 0.025 % ophthalmic solution Place 1 drop into both eyes daily as needed (for allergies).     Javier Docker Oil Omega-3 500 MG CAPS Take 1 tablet by mouth daily.     Marland Kitchen levothyroxine (SYNTHROID) 75 MCG tablet TAKE 1 TABLET BY MOUTH EVERY DAY BEFORE BREAKFAST (Patient taking differently: Take 50 mcg by mouth daily before breakfast. ) 30 tablet 5  . losartan (COZAAR) 100 MG tablet Take 100 mg by mouth daily.      . methocarbamol (ROBAXIN) 750 MG tablet Take 750 mg by mouth 2 (two) times daily as needed for muscle spasms.     . Misc Natural Products (TART CHERRY ADVANCED PO) Take 125 mLs by mouth daily. Juice    . Multiple Vitamins-Minerals (PRESERVISION AREDS 2 PO) Take 2 tablets by mouth daily.     Marland Kitchen NIFEdipine (PROCARDIA-XL/ADALAT-CC/NIFEDICAL-XL) 30 MG 24 hr tablet Take 30 mg by mouth daily.    Marland Kitchen oxyCODONE-acetaminophen (PERCOCET/ROXICET)  5-325 MG tablet Take 1 tablet by mouth every 6 (six) hours as needed for moderate pain.     . pregabalin (LYRICA) 25 MG capsule Take 25 mg by mouth 3 (three) times daily as needed (Pain).     . Propylene Glycol (SYSTANE BALANCE OP) Apply 1 drop to eye daily as needed (for dry eyes).     . simvastatin (ZOCOR) 10 MG tablet Take 10 mg by mouth every other day. In the evening    . SUPER B COMPLEX/C PO Take 1 tablet by mouth daily.     Marland Kitchen torsemide (DEMADEX) 20 MG tablet Take 20 mg by mouth daily.     . traZODone (DESYREL) 100 MG tablet Take 100 mg by mouth at bedtime.    Marland Kitchen ACCU-CHEK AVIVA PLUS test strip TEST FOUR TIMES DAILY AS DIRECTED 150 each 5  . Accu-Chek Softclix Lancets lancets USE ONE DAILY AS DIRECTED - THIS IS A 100 DAY SUPPLY 100 each 5  . GLOBAL EASE INJECT PEN NEEDLES 32G X 4 MM MISC USE ONCE DAILY WITH SOLOSTAR INSULIN 100 each 3    Drug Regimen Review Drug regimen was reviewed and remains appropriate with no significant issues identified  Home: Home Living Family/patient expects to be discharged to:: Private residence Living Arrangements: Alone Available Help at Discharge: Family, Other (Comment) Type of Home: House Additional Comments: Unable to reach son to get PLOF and enviromental questions answered   Functional History: Prior Function Comments: Unable to determine without family present  Functional Status:  Mobility: Bed Mobility Overal bed mobility: Needs Assistance Bed Mobility: Supine to Sit, Sit to Supine Rolling: Max assist Supine to sit: Max assist, +2 for physical assistance, HOB elevated Sit to supine: +2 for physical assistance, Total assist General bed mobility comments: increased time needed; cues for seuqencing and assistance required for all bed mobility using bed pad  Transfers Overall transfer level: Needs assistance Equipment used: Rolling walker (2 wheeled) Transfers: Sit to/from Stand Sit to Stand: Max assist, +2 physical assistance  Squat  pivot transfers: Max assist, Total assist General transfer comment: pt required max encouragement to complete stand, used bed pad under hips and gait belt, pt able to achieve full standing however unable to tolerate >5 sec and had episode of urinary incontinence Ambulation/Gait Ambulation/Gait assistance: Min assist Gait Distance (Feet): 3 Feet Assistive device: Rolling walker (2 wheeled) Gait Pattern/deviations: Shuffle General Gait Details: short step to shuffling gait to trun from bed to recliner Gait velocity: reduced Gait velocity interpretation: <1.31 ft/sec, indicative of household ambulator    ADL: ADL Overall ADL's : Needs assistance/impaired Eating/Feeding: Minimal assistance, Sitting Grooming: Oral care, Wash/dry face, Brushing hair, Min guard, Sitting Grooming Details (indicate cue type and reason): relies heavily on unilateral support from L UE while performing ADLs, min guard for safety Upper Body Bathing: Moderate assistance, Sitting Lower Body Bathing: Maximal assistance, Total assistance, +2 for physical assistance, +2 for safety/equipment, Bed level Upper Body Dressing : Moderate assistance, Sitting, Bed level Lower Body Dressing: Maximal assistance, Total assistance, Bed level Toilet Transfer: RW, Maximal assistance Toilet Transfer Details (indicate cue type and reason): attempted with MAX A for sit>stand. once pt standing pt reporting, "I'm done for today" Toileting- Clothing Manipulation and Hygiene: +2 for physical assistance, +2 for safety/equipment, Total assistance Functional mobility during ADLs: Maximal assistance, +2 for physical assistance, +2 for safety/equipment, Cueing for safety, Cueing for sequencing(to attempt standing) General ADL Comments: Pt limited by poor mobility, decreased ability to care for self and decreased strength. session focus on bed mobility and functional sit>stand with RW from EOB  Cognition: Cognition Overall Cognitive Status: No  family/caregiver present to determine baseline cognitive functioning Orientation Level: Oriented X4 Cognition Arousal/Alertness: Awake/alert Behavior During Therapy: Anxious Overall Cognitive Status: No family/caregiver present to determine baseline cognitive functioning Area of Impairment: Orientation, Problem solving Orientation Level: Disoriented to, Time Current Attention Level: Sustained Memory: Decreased recall of precautions, Decreased short-term memory Problem Solving: Slow processing, Decreased initiation, Requires verbal cues, Requires tactile cues General Comments: pt following commands consitently with increased time and verbal/tactile cues; pt confused at times and speaking off topic and asking who is laughing in the room althogh on one is laughing Difficult to assess due to: Impaired communication  Physical Exam: Blood pressure (!) 129/42, pulse 72, temperature 98.6 F (37 C), temperature source Oral, resp. rate 14, height 5\' 3"  (1.6 m), weight 75.9 kg, SpO2 94 %. Physical Exam  Vitals reviewed. Constitutional: She appears well-developed and well-nourished.  Obese, humming and whistling constantly "to deal with pain".  HENT:  Head: Normocephalic and atraumatic.  Mouth/Throat: Oropharynx is clear and moist. No oropharyngeal exudate.  Dentures?; face symetrical  Eyes: Conjunctivae and EOM are normal. Right eye exhibits no discharge. Left eye exhibits no discharge. No scleral icterus.  Neck: No tracheal deviation present. No thyromegaly present.  Cardiovascular:  RRR  Respiratory: Effort normal. No stridor. No respiratory distress.  CTA B/L- no W/R/R  GI: She exhibits no distension.  Soft, NT, ND, (+)BS hypoactive  Genitourinary:    Genitourinary Comments: purewick in place- medium amber urine in container   Musculoskeletal:     Cervical back: Normal range of motion and neck supple.     Comments: Right AKA with tenderness and edema  UEs 5-5/ B/L- in Deltoid, biceps,  triceps, WE, grip and finger abd  LLE- HF 2+/5 , KE ~3/5- couldn't tolerate any touch to LLE; DF and PF 4+/5 R HF- couldn't lift off bed  Neurological: She is alert.  Kept coming up with  the wrong words- said stroke instead of she was strong, thought it was evening even though full sunlight outside; and did word substitutions 3 other times that were noticed. sensation intact to light touch on LLE  Skin:  Right AKA site is dressed with retention sock, C/D/I.  Right groin incision with a small amount of clear drainage no odor left groin incision clean and dry-last seen 12/21  Psychiatric: She has a normal mood and affect. Her behavior is normal.    Results for orders placed or performed during the hospital encounter of 02/15/19 (from the past 48 hour(s))  Glucose, capillary     Status: None   Collection Time: 02/24/19  8:02 AM  Result Value Ref Range   Glucose-Capillary 90 70 - 99 mg/dL  Glucose, capillary     Status: Abnormal   Collection Time: 02/24/19 11:06 AM  Result Value Ref Range   Glucose-Capillary 113 (H) 70 - 99 mg/dL  Glucose, capillary     Status: Abnormal   Collection Time: 02/24/19  4:35 PM  Result Value Ref Range   Glucose-Capillary 102 (H) 70 - 99 mg/dL  Hemoglobin and hematocrit, blood     Status: Abnormal   Collection Time: 02/24/19  8:00 PM  Result Value Ref Range   Hemoglobin 9.3 (L) 12.0 - 15.0 g/dL    Comment: REPEATED TO VERIFY POST TRANSFUSION SPECIMEN    HCT 27.1 (L) 36.0 - 46.0 %    Comment: Performed at [facility], [place] [address], [place], [place] [address]  Glucose, capillary     Status: Abnormal   Collection Time: 02/24/19  9:49 PM  Result Value Ref Range   Glucose-Capillary 120 (H) 70 - 99 mg/dL  CBC     Status: Abnormal   Collection Time: 02/25/19  4:25 AM  Result Value Ref Range   WBC 2.4 (L) 4.0 - 10.5 K/uL   RBC 2.88 (L) 3.87 - 5.11 MIL/uL   Hemoglobin 8.8 (L) 12.0 - 15.0 g/dL   HCT 26.0 (L) 36.0 - 46.0 %   MCV 90.3 80.0 - 100.0 fL    MCH 30.6 26.0 - 34.0 pg   MCHC 33.8 30.0 - 36.0 g/dL   RDW 15.0 11.5 - 15.5 %   Platelets 92 (L) 150 - 400 K/uL    Comment: Immature Platelet Fraction may be clinically indicated, consider ordering this additional test [id number] CONSISTENT WITH PREVIOUS RESULT    nRBC 0.0 0.0 - 0.2 %    Comment: Performed at [facility], [place] [address], [place], [place] [address]  Glucose, capillary     Status: Abnormal   Collection Time: 02/25/19  6:14 AM  Result Value Ref Range   Glucose-Capillary 105 (H) 70 - 99 mg/dL  Glucose, capillary     Status: Abnormal   Collection Time: 02/25/19 11:45 AM  Result Value Ref Range   Glucose-Capillary 115 (H) 70 - 99 mg/dL  Glucose, capillary     Status: Abnormal   Collection Time: 02/25/19  4:12 PM  Result Value Ref Range   Glucose-Capillary 112 (H) 70 - 99 mg/dL  Glucose, capillary     Status: Abnormal   Collection Time: 02/25/19  9:34 PM  Result Value Ref Range   Glucose-Capillary 104 (H) 70 - 99 mg/dL  CBC     Status: Abnormal   Collection Time: 02/26/19  3:12 AM  Result Value Ref Range   WBC 3.0 (L) 4.0 - 10.5 K/uL   RBC 3.08 (L) 3.87 -  5.11 MIL/uL   Hemoglobin 9.5 (L) 12.0 - 15.0 g/dL   HCT 27.7 (L) 36.0 - 46.0 %   MCV 89.9 80.0 - 100.0 fL   MCH 30.8 26.0 - 34.0 pg   MCHC 34.3 30.0 - 36.0 g/dL   RDW 14.7 11.5 - 15.5 %   Platelets 104 (L) 150 - 400 K/uL    Comment: REPEATED TO VERIFY Immature Platelet Fraction may be clinically indicated, consider ordering this additional test [id number] CONSISTENT WITH PREVIOUS RESULT    nRBC 0.0 0.0 - 0.2 %    Comment: Performed at [facility], [place] [address] [place], [place] [address]   No results found.     Medical Problem List and Plan: 1.  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  Vancomycin resumed for wound coverage 03/01/2019  -patient may shower if R AKA wrapped  -ELOS/Goals: Min A/16-19  days.  Admit to CIR 2.  Antithrombotics: -DVT/anticoagulation: Subcutaneous heparin- suggest Doppler of LLE due to severe pain of LLE  -antiplatelet therapy: Aspirin 81 mg daily 3. Pain Management: Lyrica 25 mg 3 times daily as needed, oxycodone as needed  -likely will need more Lyrica- go back to home dose; also suggest Skelaxin for muscle relaxant since allergic to Flexeril and sedated easily; lidocaine patches 2 of them starting in AM on L calf 4. Mood: Trazodone 100 mg nightly  -antipsychotic agents: N/A 5. Neuropsych: This patient is capable of making decisions on her own behalf?- multiple word substitutions on exam- will need SLP. 6. Skin/Wound Care: Routine skin checks 7. Fluids/Electrolytes/Nutrition: Routine in and outs.  CMP ordered. 8.  Acute blood loss anemia.  CBC ordered. 9.  History of TIA.  Continue aspirin 10.  Hypertension.  Clonidine 0.2 mg nightly, hydralazine 25 mg nightly, Cozaar 100 mg daily, Demadex 20 mg daily, Procardia XL 30 mg daily.  Monitor with increased mobility. 11.  Hypothyroidism.  Synthroid 12.  Diabetes mellitus peripheral neuropathy.  Hemoglobin A1c 5.9.  Lantus insulin 15 units nightly.  Check blood sugars before meals and at bedtime.  Monitor with increased mobility. 13.  Hyperlipidemia.  Zocor 14.  Constipation.  Laxative assistance 15.  GERD.  Protonix  Lavon Paganini Angiulli, PA-C 02/26/2019  I have personally performed a face to face diagnostic evaluation, including, but not limited to relevant history and physical exam findings, of this patient and developed relevant assessment and plan.  Additionally, I have reviewed and concur with the physician assistant's documentation above.

## 2019-02-25 NOTE — Care Management Important Message (Signed)
Important Message  Patient Details  Name: SHAKAILA MCMAHON MRN: [id number] Date of Birth: 1940/10/03   Medicare Important Message Given:  Yes     Shelda Altes 02/25/2019, 1:00 PM

## 2019-02-25 NOTE — Progress Notes (Addendum)
Progress Note    02/25/2019 7:32 AM 3 Days Post-Op  Subjective:  Awake, alert. Discussed with RN. Pain controlled with oral and IV meds. Having bowel BMs. Tolerating diet. Received 2u packed cells yesterday    Vitals:   02/24/19 2022 02/25/19 0400  BP: (!) 158/50 (!) 146/47  Pulse: 78 70  Resp:  14  Temp: 98.8 F (37.1 C) 98.4 F (36.9 C)  SpO2: 97% 96%    Physical Exam: Cardiac:  RRR Lungs:  Non labored Incisions: erythema noted of right groin and prox thigh incisions. Still with some serous drainage from right groin.  AKA incision healing nicely without sign of infection. Flaps well perfused Extremities:  LLE: groin incision without signs of infection. +Doppler PT,DP and AT   CBC    Component Value Date/Time   WBC 2.4 (L) 02/25/2019 0425   RBC 2.88 (L) 02/25/2019 0425   HGB 8.8 (L) 02/25/2019 0425   HCT 26.0 (L) 02/25/2019 0425   PLT 92 (L) 02/25/2019 0425   MCV 90.3 02/25/2019 0425   MCH 30.6 02/25/2019 0425   MCHC 33.8 02/25/2019 0425   RDW 15.0 02/25/2019 0425   LYMPHSABS 1.6 11/17/2015 1228   MONOABS 0.6 11/17/2015 1228   EOSABS 0.1 11/17/2015 1228   BASOSABS 0.0 11/17/2015 1228    BMET    Component Value Date/Time   NA 142 02/24/2019 0321   NA 137 11/04/2018 1132   K 3.2 (L) 02/24/2019 0321   CL 115 (H) 02/24/2019 0321   CO2 20 (L) 02/24/2019 0321   GLUCOSE 92 02/24/2019 0321   BUN 16 02/24/2019 0321   BUN 24 11/04/2018 1132   CREATININE 1.03 (H) 02/24/2019 0321   CALCIUM 8.5 (L) 02/24/2019 0321   GFRNONAA 52 (L) 02/24/2019 0321   GFRAA 60 (L) 02/24/2019 0321     Intake/Output Summary (Last 24 hours) at 02/25/2019 0732 Last data filed at 02/25/2019 0400 Gross per 24 hour  Intake 630 ml  Output 3200 ml  Net -2570 ml    HOSPITAL MEDICATIONS Scheduled Meds: . aspirin EC  81 mg Oral Daily  . Chlorhexidine Gluconate Cloth  6 each Topical Daily  . cloNIDine  0.2 mg Oral QHS  . docusate sodium  100 mg Oral Daily  . heparin  5,000 Units  Subcutaneous Q8H  . hydrALAZINE  25 mg Oral QHS  . insulin aspart  0-9 Units Subcutaneous TID AC & HS  . insulin glargine  15 Units Subcutaneous QHS  . levothyroxine  50 mcg Oral QAC breakfast  . losartan  100 mg Oral Daily  . NIFEdipine  30 mg Oral Daily  . pantoprazole  40 mg Oral Daily  . simvastatin  40 mg Oral q1800  . sodium chloride flush  3 mL Intravenous Q12H  . sodium chloride flush  3 mL Intravenous Q12H  . torsemide  20 mg Oral Daily  . traZODone  100 mg Oral QHS   Continuous Infusions: . sodium chloride    . sodium chloride    . magnesium sulfate bolus IVPB     PRN Meds:.sodium chloride, sodium chloride, acetaminophen, alum & mag hydroxide-simeth, bisacodyl, guaiFENesin-dextromethorphan, HYDROmorphone (DILAUDID) injection, labetalol, magnesium sulfate bolus IVPB, metoprolol tartrate, morphine injection, ondansetron, oxyCODONE, phenol, polyethylene glycol, potassium chloride, pregabalin, senna-docusate  Assessment:  78 y.o. female is s/p: left CFA pseudo repair, right fem - tp bypass with subsequent right aka due to ischemic foot. Good response to transfusion with slight drift this morning>>monitor.  3 Days Post-Op  Plan: Watch CBC.  Insurance pending for rehab.    Risa Grill, PA-C [facility] [phone] 02/25/2019  7:32 AM   I have independently interviewed and examined patient and agree with PA assessment and plan above.   Jesicca Dipierro C. [name], MD [facility] [place] Office: [phone] Pager: [phone]

## 2019-02-26 LAB — CBC
HCT: 27.7 % — ABNORMAL LOW (ref 36.0–46.0)
Hemoglobin: 9.5 g/dL — ABNORMAL LOW (ref 12.0–15.0)
MCH: 30.8 pg (ref 26.0–34.0)
MCHC: 34.3 g/dL (ref 30.0–36.0)
MCV: 89.9 fL (ref 80.0–100.0)
Platelets: 104 10*3/uL — ABNORMAL LOW (ref 150–400)
RBC: 3.08 MIL/uL — ABNORMAL LOW (ref 3.87–5.11)
RDW: 14.7 % (ref 11.5–15.5)
WBC: 3 10*3/uL — ABNORMAL LOW (ref 4.0–10.5)
nRBC: 0 % (ref 0.0–0.2)

## 2019-02-26 LAB — GLUCOSE, CAPILLARY
Glucose-Capillary: 56 mg/dL — ABNORMAL LOW (ref 70–99)
Glucose-Capillary: 122 mg/dL — ABNORMAL HIGH (ref 70–99)
Glucose-Capillary: 107 mg/dL — ABNORMAL HIGH (ref 70–99)
Glucose-Capillary: 145 mg/dL — ABNORMAL HIGH (ref 70–99)
Glucose-Capillary: 135 mg/dL — ABNORMAL HIGH (ref 70–99)

## 2019-02-26 NOTE — Discharge Summary (Signed)
Bypass Discharge Summary Patient ID: Debra Hopkins [id number] 78 y.o. [birth year]  Admit date: 02/15/2019  Discharge date and time: 03/02/2019  12:11pm   Admitting Physician: Waynetta Sandy, MD   Discharge Physician: same  Admission Diagnoses: Critical limb ischemia  Discharge Diagnoses: same  Admission Condition: poor  Discharged Condition: fair  Indication for Admission: Critical limb ischemia  Hospital Course: Ms. Debra Hopkins is a 78 year old female who came in as an outpatient for right lower extremity arteriogram due to critical limb ischemia and underwent right SFA and popliteal stenting by Dr. [name] on 02/15/2019.  She tolerated the procedure well and was admitted overnight.  POD #1 right foot was cold and mottled thus she was started on IV heparin and brought urgently to the operating room.  It was also noted that when she was found to have a pseudoaneurysm of left common femoral artery at catheterization site.  She underwent open repair of left common femoral artery in addition to right femoral endarterectomy, right lower extremity thromboembolectomy, TP trunk endarterectomy and femoral to TP trunk bypass with vein.  Postoperatively patient maintained a brisk peroneal signal to the level of the ankle however over the next few days right foot darkened in color and progressively became necrotic.  After much discussion with family and the patient she eventually consented to amputation and underwent right above-the-knee amputation by Dr. Donnetta Hutching on 02/22/2019.  She received 2 units of red blood cells postoperatively due to symptomatic anemia.  Her right froin drained clear serous fluid and she developed erythema of the right groin and proximal thigh incisions.  Vancomycin was started and attention to keeping these areas dry was carried out.  No dehiscence developed.  She was then evaluated by PT/OT who recommended CIR.  Inpatient rehabilitation was consulted who agreed that patient  was a good candidate for rehab.  The remainder of hospital stay was waiting for insurance approval.  She had a drift in serum potassium and this was repleted orally to normal value.  Surgical staples were removed from both groins and the right thigh.  She will follow-up in office for incision check of bilateral groins and vein harvest incisions in 2 weeks.  Discharge instructions were reviewed with the patient and she voiced her understanding.  She will be discharged to inpatient rehabilitation in stable condition.  Consults: None  Treatments: surgery: Right lower extremity arteriogram with stenting of right SFA and popliteal arteries by Dr. [name] on 02/15/2019 Open repair of left common femoral artery pseudoaneurysm and right femoral endarterectomy TP trunk endarterectomy thrombectomy and right femoral to TP trunk bypass with vein by Dr. [name] on 02/16/2019 Right above-the-knee amputation by Dr. Donnetta Hutching 02/22/2019  Discharge Exam: Alert and oriented X 4 CV: RRR. Postive DP and AT doppler signals left lower extremity Lungs CTA bil ABD: soft, active BS Right groin: less erythema, minimal clear drainage Right LE: proximal thigh incision erythematous, scant clear drainage.  AKA incision line well approximated. Moderate edema. Flaps warm and well perfused Left groin: Incision healing without signs of  infection Vitals:   03/02/19 0500 03/02/19 1120  BP: (!) 146/59 (!) 143/60  Pulse: 73 65  Resp: 18 17  Temp: 98.1 F (36.7 C) 98 F (36.7 C)  SpO2: 96% 98%    Disposition: Discharge disposition: [place] Not Defined      - For [facility] Registry use ---  Post-op:  Wound infection: yes  Graft infection: No  Transfusion: Yes  If yes, 2 units given  New Arrhythmia: No D/C Ambulatory Status: Wheelchair  Complications: MI: [x ] No, [ ]  Troponin only, [ ]  EKG or Clinical CHF: No Resp failure: [x ] none, [ ]  Pneumonia, [ ]  Ventilator Chg in renal function: [x ] none, [ ]   Inc. Cr > 0.5, [ ]  Temp. Dialysis, [ ]  Permanent dialysis Stroke: [ x] None, [ ]  Minor, [ ]  Major Return to OR: Yes  Reason for return to OR: [ ]  Bleeding, [ ]  Infection, [ ]  Thrombosis, [ ]  Revision, [x]  ipsilateral amputation  Discharge medications: Statin use:  No  for medical reason Allergy ASA use:  Yes Plavix use:  No  for medical reason Not indicated Beta blocker use: No  for medical reason Not indicated Coumadin use: No  for medical reason Not indicated    Patient Instructions:  Allergies as of 03/02/2019      Reactions   Amoxicillin-pot Clavulanate Hives, Other (See Comments)   Has patient had a PCN reaction causing immediate rash, facial/tongue/throat swelling, SOB or lightheadedness with hypotension: UNKNOWN Has patient had a PCN reaction causing severe rash involving mucus membranes or skin necrosis: UNKNOWN Has patient had a PCN reaction that required hospitalization No Has patient had a PCN reaction occurring within the last 10 years: No If all of the above answers are "NO", then may proceed with Cephalosporin use.   Byetta 10 Mcg Pen [exenatide] Other (See Comments)   Unknown   Ceclor [cefaclor] Other (See Comments)   Unknown   Celexa [citalopram Hydrobromide] Other (See Comments)   Sick   Ciprofloxacin Nausea Only, Other (See Comments)   Dizziness   Claritin [loratadine] Other (See Comments)   Hair fell out   Erythromycin Other (See Comments)   REACTION: Upset stomach   Flexeril [cyclobenzaprine] Other (See Comments)   Nervous   Fosamax [alendronate] Other (See Comments)   Made pt sick   Jardiance [empagliflozin] Other (See Comments)   Vaginal infection, UTI   Motrin [ibuprofen] Nausea Only   Naproxen Other (See Comments)   REACTION: severe nausea   Norvasc [amlodipine Besylate] Other (See Comments)   Unknown   Prednisone Other (See Comments)   REACTION: itchy rash   Prozac [fluoxetine Hcl] Other (See Comments)   Sick   Sulfamethoxazole-trimethoprim  Nausea And Vomiting, Other (See Comments)   REACTION: Upset stomach      Medication List    STOP taking these medications   Lantus SoloStar 100 UNIT/ML Solostar Pen Generic drug: Insulin Glargine Replaced by: insulin glargine 100 UNIT/ML injection     TAKE these medications   Accu-Chek Aviva Plus test strip Generic drug: glucose blood TEST FOUR TIMES DAILY AS DIRECTED   Accu-Chek Softclix Lancets lancets USE ONE DAILY AS DIRECTED - THIS IS A 100 DAY SUPPLY   Alaway 0.025 % ophthalmic solution Generic drug: ketotifen Place 1 drop into both eyes daily as needed (for allergies).   allopurinol 100 MG tablet Commonly known as: ZYLOPRIM Take 100 mg by mouth daily as needed (Gout).   aspirin EC 81 MG tablet Take 81 mg by mouth daily.   Calcium Citrate 250 MG Tabs Take 200 mg of elemental calcium by mouth daily.   clobetasol 0.05 % external solution Commonly known as: TEMOVATE Apply 1 application topically daily as needed (eczema).   cloNIDine 0.1 MG tablet Commonly known as: CATAPRES Take 0.2 mg by mouth at bedtime.   Demadex 20 MG tablet Generic drug: torsemide Take 20 mg by mouth daily.   esomeprazole 20 MG  capsule Commonly known as: NEXIUM Take 20 mg by mouth daily before breakfast.   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   Global Ease Inject Pen Needles 32G X 4 MM Misc Generic drug: Insulin Pen Needle USE ONCE DAILY WITH SOLOSTAR INSULIN   hydrALAZINE 25 MG tablet Commonly known as: APRESOLINE Take 25 mg by mouth at bedtime.   insulin glargine 100 UNIT/ML injection Commonly known as: LANTUS Inject 0.1 mLs (10 Units total) into the skin at bedtime. Replaces: Lantus SoloStar 100 UNIT/ML Solostar Pen   Krill Oil Omega-3 500 MG Caps Take 1 tablet by mouth daily.   levothyroxine 75 MCG tablet Commonly known as: SYNTHROID TAKE 1 TABLET BY MOUTH EVERY DAY BEFORE BREAKFAST What  changed: See the new instructions.   losartan 100 MG tablet Commonly known as: COZAAR Take 100 mg by mouth daily.   methocarbamol 750 MG tablet Commonly known as: ROBAXIN Take 750 mg by mouth 2 (two) times daily as needed for muscle spasms.   NIFEdipine 30 MG 24 hr tablet Commonly known as: PROCARDIA-XL/NIFEDICAL-XL Take 30 mg by mouth daily.   OSTEO BI-FLEX ONE PER DAY PO Take 1 tablet by mouth every other day.   oxyCODONE-acetaminophen 5-325 MG tablet Commonly known as: PERCOCET/ROXICET Take 1 tablet by mouth every 6 (six) hours as needed for moderate pain.   pregabalin 25 MG capsule Commonly known as: LYRICA Take 25 mg by mouth 3 (three) times daily as needed (Pain).   PRESERVISION AREDS 2 PO Take 2 tablets by mouth daily.   Refresh Optive Advanced 0.5-1-0.5 % Soln Generic drug: Carboxymeth-Glycerin-Polysorb Apply 1 drop to eye daily as needed (for dry eyes).   simvastatin 10 MG tablet Commonly known as: ZOCOR Take 10 mg by mouth every other day. In the evening   SUPER B COMPLEX/C PO Take 1 tablet by mouth daily.   SYSTANE BALANCE OP Apply 1 drop to eye daily as needed (for dry eyes).   TART CHERRY ADVANCED PO Take 125 mLs by mouth daily. Juice   traZODone 100 MG tablet Commonly known as: DESYREL Take 100 mg by mouth at bedtime.   Vitamin D3 50 MCG (2000 UT) Tabs Take 2,000 Units by mouth daily.      Activity: activity as tolerated Diet: regular diet Wound Care: keep wound clean and dry  Follow-up with Dr. [name] in 3 weeks.   Signed: Barbie Banner 03/02/2019 12:10 PM

## 2019-02-26 NOTE — Progress Notes (Signed)
Physical Therapy Treatment Patient Details Name: Debra Hopkins MRN: [id number] DOB: 01-08-1941 Today's Date: 02/26/2019    History of Present Illness pt is a 78 y/o female with PMH significant for CAD, CA, DDD, DM, macular degeneration, TIA and PVD admitted with acute on chrionic right LE ischemia.  The day prior to admission she underwent stenting of her right SFA and popliteal arteries and now has mottling of the right foot.  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 Pt in bed upon arrival and agreeable to participate in therapy. Pt requires mod/max A +2 for bed mobility and max A +2 for A/P transfer bed to recliner. Pt tolerated session well and much less anxious today with mobility. Current plan remains appropriate.     Follow Up Recommendations  CIR;Other (comment)     Equipment Recommendations  Wheelchair (measurements PT);Wheelchair cushion (measurements PT)    Recommendations for Other Services Rehab consult     Precautions / Restrictions Precautions Precautions: Fall Precaution Comments: R AKA Restrictions Weight Bearing Restrictions: Yes RLE Weight Bearing: Non weight bearing    Mobility  Bed Mobility Overal bed mobility: Needs Assistance Bed Mobility: Supine to Sit     Supine to sit: HOB elevated;Mod assist;+2 for physical assistance;Max assist     General bed mobility comments: pt assisted into long sitting in bed with bed pad utilized to move hips and use of sock to assist with L LE due to pain with tactile input; cues for sequencing and use of rail  Transfers Overall transfer level: Needs assistance   Transfers: Comptroller transfers: Max assist;+2 physical assistance   General transfer comment: use of bed pad to assist with moving hips and cues for  seuquencing, hand placement,and to keep trunk anterior   Ambulation/Gait                 Stairs             Wheelchair Mobility    Modified Rankin (Stroke Patients Only)       Balance Overall balance assessment: Needs assistance Sitting-balance support: Feet supported;Bilateral upper extremity supported Sitting balance-Leahy Scale: Poor                                      Cognition Arousal/Alertness: Awake/alert Behavior During Therapy: Anxious;WFL for tasks assessed/performed(much less anxious with mobility this session) Overall Cognitive Status: Within Functional Limits for tasks assessed                                 General Comments: pt with improved mentation compared to previous session       Exercises      General Comments        Pertinent Vitals/Pain Pain Assessment: Faces Faces Pain Scale: Hurts even more Pain Location: bilat LE pain (L LE with tactile input) Pain Descriptors / Indicators: Discomfort;Moaning;Guarding;Sore Pain Intervention(s): Limited activity within patient's tolerance;Monitored during session;Repositioned;Premedicated before session    Home Living                      Prior Function  PT Goals (current goals can now be found in the care plan section) Progress towards PT goals: Progressing toward goals    Frequency    Min 3X/week      PT Plan Current plan remains appropriate    Co-evaluation              AM-PAC PT "6 Clicks" Mobility   Outcome Measure  Help needed turning from your back to your side while in a flat bed without using bedrails?: A Lot Help needed moving from lying on your back to sitting on the side of a flat bed without using bedrails?: A Lot Help needed moving to and from a bed to a chair (including a wheelchair)?: A Lot Help needed standing up from a chair using your arms (e.g., wheelchair or bedside chair)?: Total Help needed to  walk in hospital room?: Total Help needed climbing 3-5 steps with a railing? : Total 6 Click Score: 9    End of Session   Activity Tolerance: Patient tolerated treatment well Patient left: with call bell/phone within reach;in chair Nurse Communication: Mobility status PT Visit Diagnosis: Other abnormalities of gait and mobility (R26.89);Pain;Difficulty in walking, not elsewhere classified (R26.2) Pain - Right/Left: Right Pain - part of body: Leg     Time: [id number] PT Time Calculation (min) (ACUTE ONLY): 21 min  Charges:  $Therapeutic Activity: 8-22 mins                     Earney Navy, PTA Acute Rehabilitation Services Pager: [phone] Office: [phone]     Darliss Cheney 02/26/2019, 4:00 PM

## 2019-02-26 NOTE — Progress Notes (Addendum)
Inpatient Rehab Admissions Coordinator:   Met with pt at bedside.  I await word from insurance on status of request for prior authorization for CIR.   Addendum: No update from insurance.  I will f/u with pt on Monday.   Shann Medal, PT, DPT Admissions Coordinator [phone] 02/26/19  12:18 PM

## 2019-02-26 NOTE — Consult Note (Signed)
PV Navigator consult acknowledged and I have reviewed the chart.   78 y/o female admitted 02/15/19 with critical limb ischemia right lower extremity. She underwent abdominal aortogram with intervention 02/15/19, however continued to have ischemic changes to extremity. Had bypass graft 02/16/19 with gangrenous changes to right foot resulting in a right above the knee amputation per patient's choice on 02/22/19.  Medical history to include: CAD, Carotid artery occlusion, DM 2, PVD and CKD.  Disciplines in place: PT/OT, IP rehab consulted, Diabetes coordinator and [facility] team.  Able to speak with patient's son [name]) via phone. Introduced myself and role. He states goals are for his mom to go to inpatient rehab pending insurance approval and then hopefully to discharge home from there. He states he understands that his mom will need additional help when she discharges home and he is confident that he will have the resources available to assist her. He denies any additional questions or barriers at this time. I encouraged him to call should any arise.   Thank you, Cletis Media RN BSN [facility] [place] [phone]

## 2019-02-26 NOTE — Progress Notes (Addendum)
CBG 56 mg/dl at 06:30 am. Initiated hypoglycemic standing order. Orange juice given. Pt was diaphoresis, cool, clammy, lethargy, awake, oriented x 4  and followed commands. BP 114/46, HR 72. Will follow CBG closely. Continue to monitor.   Debra Hopkins [id number]

## 2019-02-26 NOTE — Discharge Instructions (Signed)
 [facility] [place]  Discharge instructions  Lower Extremity Bypass Surgery  Please refer to the following instruction for your post-procedure care. Your surgeon or physician assistant will discuss any changes with you.  Activity  You are encouraged to walk as much as you can. You can slowly return to normal activities during the month after your surgery. Avoid strenuous activity and heavy lifting until your doctor tells you it's OK. Avoid activities such as vacuuming or swinging a golf club. Do not drive until your doctor give the OK and you are no longer taking prescription pain medications. It is also normal to have difficulty with sleep habits, eating and bowel movement after surgery. These will go away with time.  Bathing/Showering  You may shower after you go home. Do not soak in a bathtub, hot tub, or swim until the incision heals completely.  Incision Care  Clean your incision with mild soap and water. Shower every day. Pat the area dry with a clean towel. You do not need a bandage unless otherwise instructed. Do not apply any ointments or creams to your incision. If you have open wounds you will be instructed how to care for them or a visiting nurse may be arranged for you. If you have staples or sutures along your incision they will be removed at your post-op appointment. You may have skin glue on your incision. Do not peel it off. It will come off on its own in about one week. If you have a great deal of moisture in your groin, use a gauze help keep this area dry.  Diet  Resume your normal diet. There are no special food restrictions following this procedure. A low fat/ low cholesterol diet is recommended for all patients with vascular disease. In order to heal from your surgery, it is CRITICAL to get adequate nutrition. Your body requires vitamins, minerals, and protein. Vegetables are the best source of vitamins and minerals. Vegetables also provide the  perfect balance of protein. Processed food has little nutritional value, so try to avoid this.  Medications  Resume taking all your medications unless your doctor or nurse practitioner tells you not to. If your incision is causing pain, you may take over-the-counter pain relievers such as acetaminophen (Tylenol). If you were prescribed a stronger pain medication, please aware these medication can cause nausea and constipation. Prevent nausea by taking the medication with a snack or meal. Avoid constipation by drinking plenty of fluids and eating foods with high amount of fiber, such as fruits, vegetables, and grains. Take Colase 100 mg (an over-the-counter stool softener) twice a day as needed for constipation. Do not take Tylenol if you are taking prescription pain medications.  Follow Up  Our office will schedule a follow up appointment 2-3 weeks following discharge.  Please call us immediately for any of the following conditions  Severe or worsening pain in your legs or feet while at rest or while walking Increase pain, redness, warmth, or drainage (pus) from your incision site(s) Fever of 101 degree or higher The swelling in your leg with the bypass suddenly worsens and becomes more painful than when you were in the hospital If you have been instructed to feel your graft pulse then you should do so every day. If you can no longer feel this pulse, call the office immediately. Not all patients are given this instruction.  Leg swelling is common after leg bypass surgery.  The swelling should improve over a few months   following surgery. To improve the swelling, you may elevate your legs above the level of your heart while you are sitting or resting. Your surgeon or physician assistant may ask you to apply an ACE wrap or wear compression (TED) stockings to help to reduce swelling.  Reduce your risk of vascular disease  Stop smoking. If you would like help call [facility] at 1-800-QUIT-NOW  (1-800-784-8669) or [place] at 336-586-4000.  Manage your cholesterol Maintain a desired weight Control your diabetes weight Control your diabetes Keep your blood pressure down  If you have any questions, please call the office at [phone]   

## 2019-02-26 NOTE — PMR Pre-admission (Signed)
PMR Admission Coordinator Pre-Admission Assessment  Patient: Debra Hopkins is an 78 y.o., female MRN: [id number] DOB: 1940/09/13 Height: _0  (160 cm) Weight: 75.9 kg  Insurance Information HMO:     PPO: yes     PCP:      IPA:      80/20:      OTHER:  PRIMARY: [employer] Medicare      Policy#: [id number]      Subscriber: pt CM Name: Veatrice Bourbon      Phone#: [phone]     Fax#: [phone] Pre-Cert#: [id number] [place] for CIR admission provided by Veatrice Bourbon at [facility] with updates due on 12/24 (7 days from initial request, and can be sent on 12/23) to fax listed above      Employer: n/a Benefits:  Phone #: [phone]     Name: n/a Eff. Date: 03/11/2018     Deduct: $0     Out of Pocket Max: [phone] met, but will reset on 03/12/2019)      Life Max: n/a CIR: $345/day for days 1-5      SNF: n/a Outpatient:      Co-Pay: $40/visit Home Health: 100%      Co-Pay:  DME: 80%     Co-Pay: 20% Providers: preferred network  SECONDARY:       Policy#:       Subscriber:  CM Name:       Phone#:      Fax#:  Pre-Cert#:       Employer:  Benefits:  Phone #:      Name:  Eff. Date:      Deduct:       Out of Pocket Max:       Life Max:  CIR:       SNF:  Outpatient:      Co-Pay:  Home Health:       Co-Pay:  DME:      Co-Pay:   Medicaid Application Date:       Case Manager:  Disability Application Date:       Case Worker:   The "Data Collection Information Summary" for patients in Inpatient Rehabilitation Facilities with attached "Privacy Act [place] Records" was provided and verbally reviewed with: Patient and Family  Emergency Contact Information Contact Information    Name Relation Home Work Mabscott Sr Son [phone]  [phone]   Izora Gala [phone]  [phone]      Current Medical History  Patient Admitting Diagnosis: R AKA  History of Present Illness: Debra Hopkins is a 78 year old right-handed female history of CAD, diabetes mellitus, TIA, left  CEA 2008, fibromyalgia, macular degeneration, peripheral vascular disease with multiple revascularization procedures and maintained on low-dose aspirin.  History taken from chart review and patient.  Presented on 02/15/2019 with chronic RLE ischemic changes and toe ulceration. Recent aortogram showed aortic and iliac segments heavily calcified.  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 [name] on 02/15/2019.  Noted postoperatively mottling of the right foot started on heparin drip.  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 Donnetta Hutching.  Hospital course further complicated by acute blood loss anemia patient was transfused 2 units with latest hemoglobin 9.5.  Subcutaneous heparin was  Hospital course further complicated by acute blood loss anemia patient was transfused 2 units with latest hemoglobin 9.5.  Subcutaneous heparin was initiated for DVT prophylaxis 02/23/2019 as well as remaining on low-dose aspirin.  Therapy evaluations were completed and pt was recommended for CIR.    Complete NIHSS TOTAL: 0   Patient's medical record from [facility] has been reviewed by the rehabilitation admission coordinator and physician.   Past Medical History      Past Medical History:  Diagnosis Date  . Arthritis    . CAD (coronary artery disease)    . Cancer (HCC)    . Carotid artery occlusion    . Cataracts, bilateral    . DDD (degenerative disc disease)    . Diabetes mellitus      Type 2  . Diverticulitis    . Dyspnea    . Fibromyalgia    . GERD (gastroesophageal reflux disease)    . Heart murmur    . Hemorrhoids    . History of shingles    . Impaired hearing    . Macular degeneration    . Other and unspecified hyperlipidemia    . PVD (peripheral vascular disease) (HCC)    . TIA (transient ischemic attack)    .  Unspecified essential hypertension    . Varicose veins        Family History   family history includes Coronary artery disease in her father and another family member; Diabetes in her father, sister, and son; Healthy in her brother; Heart disease in her father; Hypertension in her father, mother, sister, and son; Lung cancer in her brother; Sinusitis in her brother; Stroke in her father and mother.   Prior Rehab/Hospitalizations Has the patient had prior rehab or hospitalizations prior to admission? No   Has the patient had major surgery during 100 days prior to admission? Yes              Current Medications   Current Facility-Administered Medications:  .  0.9 %  sodium chloride infusion, 250 mL, Intravenous, PRN, Eveland, Matthew, PA-C .  0.9 %  sodium chloride infusion, 500 mL, Intravenous, Once PRN, Eveland, Matthew, PA-C .  acetaminophen (TYLENOL) tablet 650 mg, 650 mg, Oral, Q4H PRN, Eveland, Matthew, PA-C, 650 mg at 03/01/19 1006 .  alum & mag hydroxide-simeth (MAALOX/MYLANTA) 200-200-20 MG/5ML suspension 15-30 mL, 15-30 mL, Oral, Q2H PRN, Eveland, Matthew, PA-C .  aspirin EC tablet 81 mg, 81 mg, Oral, Daily, Eveland, Matthew, PA-C, 81 mg at 03/01/19 1006 .  bisacodyl (DULCOLAX) suppository 10 mg, 10 mg, Rectal, Daily PRN, Eveland, Matthew, PA-C, 10 mg at 02/17/19 2027 .  Chlorhexidine Gluconate Cloth 2 % PADS 6 each, 6 each, Topical, Daily, Eveland, Matthew, PA-C, 6 each at 03/02/19 0450 .  cloNIDine (CATAPRES) tablet 0.2 mg, 0.2 mg, Oral, QHS, Eveland, Matthew, PA-C, 0.2 mg at 03/01/19 2119 .  docusate sodium (COLACE) capsule 100 mg, 100 mg, Oral, Daily, Eveland, Matthew, PA-C, 100 mg at 03/01/19 1006 .  guaiFENesin-dextromethorphan (ROBITUSSIN DM) 100-10 MG/5ML syrup 15 mL, 15 mL, Oral, Q4H PRN, Eveland, Matthew, PA-C .  heparin injection 5,000 Units, 5,000 Units, Subcutaneous, Q8H, Eveland, Matthew, PA-C, 5,000 Units at 03/02/19 0450 .  hydrALAZINE (APRESOLINE) tablet 25 mg, 25  mg, Oral, QHS, Eveland, Matthew, PA-C, 25 mg at 03/01/19 2119 .  HYDROmorphone (DILAUDID) injection 0.5-1 mg, 0.5-1 mg, Intravenous, Q1H PRN, Eveland, Matthew, PA-C, 1 mg at 02/27/19 0350 .  insulin aspart (novoLOG) injection 0-9 Units, 0-9 Units,   Subcutaneous, TID AC & HS, Cain, Brandon Christopher, MD, 1 Units at 02/28/19 2127 .  insulin glargine (LANTUS) injection 15 Units, 15 Units, Subcutaneous, QHS, Eveland, Matthew, PA-C, 15 Units at 03/01/19 2119 .  labetalol (NORMODYNE) injection 5 mg, 5 mg, Intravenous, Q6H PRN, Eveland, Matthew, PA-C, 5 mg at 02/24/19 1002 .  levothyroxine (SYNTHROID) tablet 50 mcg, 50 mcg, Oral, QAC breakfast, Eveland, Matthew, PA-C, 50 mcg at 03/02/19 0449 .  losartan (COZAAR) tablet 100 mg, 100 mg, Oral, Daily, Eveland, Matthew, PA-C, 100 mg at 03/01/19 0833 .  magnesium sulfate IVPB 2 g 50 mL, 2 g, Intravenous, Daily PRN, Eveland, Matthew, PA-C .  metoprolol tartrate (LOPRESSOR) injection 2-5 mg, 2-5 mg, Intravenous, Q2H PRN, Eveland, Matthew, PA-C .  morphine 2 MG/ML injection 2 mg, 2 mg, Intravenous, Q2H PRN, Eveland, Matthew, PA-C, 2 mg at 03/01/19 2119 .  NIFEdipine (PROCARDIA-XL/NIFEDICAL-XL) 24 hr tablet 30 mg, 30 mg, Oral, Daily, Eveland, Matthew, PA-C, 30 mg at 03/01/19 1006 .  ondansetron (ZOFRAN) injection 4 mg, 4 mg, Intravenous, Q6H PRN, Eveland, Matthew, PA-C .  oxyCODONE (Oxy IR/ROXICODONE) immediate release tablet 5-10 mg, 5-10 mg, Oral, Q4H PRN, Eveland, Matthew, PA-C, 10 mg at 03/02/19 0449 .  pantoprazole (PROTONIX) EC tablet 40 mg, 40 mg, Oral, Daily, Eveland, Matthew, PA-C, 40 mg at 03/01/19 1006 .  phenol (CHLORASEPTIC) mouth spray 1 spray, 1 spray, Mouth/Throat, PRN, Eveland, Matthew, PA-C .  polyethylene glycol (MIRALAX / GLYCOLAX) packet 17 g, 17 g, Oral, Daily PRN, Eveland, Matthew, PA-C, 17 g at 03/01/19 1005 .  potassium chloride SA (KLOR-CON) CR tablet 20-40 mEq, 20-40 mEq, Oral, Daily PRN, Eveland, Matthew, PA-C .  pregabalin (LYRICA)  capsule 25 mg, 25 mg, Oral, TID PRN, Eveland, Matthew, PA-C, 25 mg at 03/01/19 2119 .  senna-docusate (Senokot-S) tablet 1 tablet, 1 tablet, Oral, QHS PRN, Eveland, Matthew, PA-C .  simvastatin (ZOCOR) tablet 40 mg, 40 mg, Oral, q1800, Eveland, Matthew, PA-C, 40 mg at 03/01/19 1756 .  sodium chloride flush (NS) 0.9 % injection 10-40 mL, 10-40 mL, Intracatheter, Q12H, Cain, Brandon Christopher, MD, 10 mL at 02/27/19 2204 .  sodium chloride flush (NS) 0.9 % injection 10-40 mL, 10-40 mL, Intracatheter, PRN, Cain, Brandon Christopher, MD .  sodium chloride flush (NS) 0.9 % injection 3 mL, 3 mL, Intravenous, Q12H, Eveland, Matthew, PA-C, 3 mL at 03/01/19 2126 .  sodium chloride flush (NS) 0.9 % injection 3 mL, 3 mL, Intravenous, Q12H, Eveland, Matthew, PA-C, 3 mL at 03/01/19 2127 .  torsemide (DEMADEX) tablet 20 mg, 20 mg, Oral, Daily, Eveland, Matthew, PA-C, 20 mg at 03/01/19 1006 .  traZODone (DESYREL) tablet 100 mg, 100 mg, Oral, QHS, Eveland, Matthew, PA-C, 100 mg at 03/01/19 2119 .  vancomycin (VANCOCIN) IVPB 1000 mg/200 mL premix, 1,000 mg, Intravenous, Q24H, Cain, Brandon Christopher, MD, Last Rate: 200 mL/hr at 03/01/19 2124, 1,000 mg at 03/01/19 2124   Patients Current Diet:     Diet Order                      Diet heart healthy/carb modified Room service appropriate? No; Fluid consistency: Thin  Diet effective now                   Precautions / Restrictions Precautions Precautions: Fall Precaution Comments: R AKA Restrictions Weight Bearing Restrictions: Yes RLE Weight Bearing: Non weight bearing    Has the patient had 2 or more falls or a fall with injury in the past year? Yes     tasks such as shopping or remembering to take medications? Independent  Home Assistive Devices / Equipment Home Assistive Devices/Equipment: None  Prior Device Use: Indicate devices/aids used by the patient prior to current illness, exacerbation or injury? None of the above  Current Functional Level Cognition  Overall Cognitive Status: Impaired/Different from baseline Difficult to assess due to: Impaired communication Current Attention Level: Sustained Orientation Level: Oriented to person, Disoriented to place, Disoriented to time, Disoriented to situation Safety/Judgement: Decreased awareness of safety General Comments: very flat affect, clear STM deficits    Extremity Assessment (includes Sensation/Coordination)  Upper Extremity Assessment: Generalized weakness  Lower Extremity Assessment: Defer to PT evaluation RLE Deficits / Details: s/p sx; wrapped in ace wrap and nearly purple; hurts to touch RLE: Unable to fully assess due to pain RLE Coordination: decreased gross motor, decreased fine motor LLE Deficits / Details: painful to touch and move.  grossly 3/5 strength.  pt able to w/bear for transfer and standing for peri-care. LLE: Unable to fully assess due to pain LLE Coordination: decreased fine motor, decreased gross motor    ADLs  Overall ADL's : Needs assistance/impaired Eating/Feeding: Minimal assistance, Sitting Grooming: Wash/dry face, Sitting, Set up Grooming Details (indicate cue type and reason): relies heavily on unilateral support from L UE while performing ADLs, min guard for safety Upper Body Bathing: Moderate assistance, Sitting Lower Body Bathing: Maximal assistance, Total assistance, +2 for physical assistance, +2 for safety/equipment, Bed level Upper Body Dressing : Moderate assistance, Sitting, Bed  level Lower Body Dressing: Maximal assistance, Sitting/lateral leans Lower Body Dressing Details (indicate cue type and reason): MAX A to don socks from bed level Toilet Transfer: Maximal assistance Toilet Transfer Details (indicate cue type and reason): MAX A with stedy for sit>stand Toileting- Clothing Manipulation and Hygiene: +2 for physical assistance, +2 for safety/equipment, Total assistance Functional mobility during ADLs: Maximal assistance, +2 for physical assistance, +2 for safety/equipment, Cueing for safety, Cueing for sequencing(to attempt standing) General ADL Comments: session focus on seated grooming tasks and functional mobility via stedy. Pt required MAX A +2 for sit>stand    Mobility  Overal bed mobility: Needs Assistance Bed Mobility: Supine to Sit Rolling: Max assist Supine to sit: HOB elevated, Min guard, Min assist, +2 for safety/equipment Sit to supine: +2 for physical assistance, Total assist General bed mobility comments: Min guard-MinA, Mod-Max VC to scoot to EOB, patient seems fearful of moving R residual limb and required encouragement to do so. Occasional MinA to scoot to EOB. Significantly extended time/increased effort    Transfers  Overall transfer level: Needs assistance Equipment used: Ambulation equipment used Transfers: Sit to/from Stand Sit to Stand: Max assist, +2 physical assistance Squat pivot transfers: Total assist Anterior-Posterior transfers: Max assist, +2 physical assistance General transfer comment: MaxAx2 to come to standing position with hips extended enough to clear stedy pads; on second stand MaxAx2 to barely clear stedy seat    Ambulation / Gait / Stairs / Wheelchair Mobility  Ambulation/Gait Ambulation/Gait assistance: Herbalist (Feet): 3 Feet Assistive device: Rolling walker (2 wheeled) Gait Pattern/deviations: Shuffle General Gait Details: short step to shuffling gait to trun from bed to recliner Gait velocity:  reduced Gait velocity interpretation: <1.31 ft/sec, indicative of household ambulator    Posture / Balance Dynamic Sitting Balance Sitting balance - Comments: S for safety Balance Overall balance assessment: Needs assistance Sitting-balance support: Feet supported, Bilateral upper extremity supported Sitting balance-Leahy Scale: Fair Sitting balance - Comments: S for safety Standing balance support: Bilateral upper  extremity supported, During functional activity Standing balance-Leahy Scale: Zero Standing balance comment: reliant on external support    Special needs/care consideration BiPAP/CPAP no CPM no Continuous Drip IV no Dialysis no        Days n/a Life Vest no Oxygen no Special Bed no Trach Size no Wound Vac (area) no      Location n/a Skin amputation, RLE, L groin incision                     Bowel mgmt:  Bladder mgmt:  Diabetic mgmt: yes Behavioral consideration  Chemo/radiation    Previous Home Environment (from acute therapy documentation) Living Arrangements: Alone Available Help at Discharge: Family, Other (Comment) Type of Home: [place]: No Additional Comments: Unable to reach son to get PLOF and enviromental questions answered  Discharge Living Setting Plans for Discharge Living Setting: Other (Comment) Type of Home at Discharge: Other (Comment)(town home) Discharge Home Layout: One level Discharge Home Access: Level entry Discharge Bathroom Shower/Tub: Walk-in shower Discharge Bathroom Toilet: Handicapped height Discharge Bathroom Accessibility: Yes How Accessible: Accessible via wheelchair Does the patient have any problems obtaining your medications?: No  Social/Family/Support Systems Anticipated Caregiver: son, Kristiann Noyce Anticipated Caregiver's Contact Information: [phone] Caregiver Availability: 24/7 Discharge Plan Discussed with Primary Caregiver: Yes Is Caregiver In Agreement with Plan?: Yes Does Caregiver/Family have  Issues with Lodging/Transportation while Pt is in Rehab?: No  Goals/Additional Needs Patient/Family Goal for Rehab: PT/OT supervision w/c level Expected length of stay: 18-21 days Dietary Needs: heart healthy/carb modified Pt/Family Agrees to Admission and willing to participate: Yes Program Orientation Provided & Reviewed with Pt/Caregiver Including Roles  & Responsibilities: Yes  Decrease burden of Care through IP rehab admission: n/a  Possible need for SNF placement upon discharge: Not anticipated.  Son plans to ensure pt has "whatever she needs" at discharge from CIR.   Patient Condition: I have reviewed medical records from [facility], spoken with CM, and patient and son. I met with patient at the bedside and discussed via phone for inpatient rehabilitation assessment.  Patient will benefit from ongoing PT and OT, can actively participate in 3 hours of therapy a day 5 days of the week, and can make measurable gains during the admission.  Patient will also benefit from the coordinated team approach during an Inpatient Acute Rehabilitation admission.  The patient will receive intensive therapy as well as Rehabilitation physician, nursing, social worker, and care management interventions.  Due to safety, skin/wound care, disease management, medication administration, pain management and patient education the patient requires 24 hour a day rehabilitation nursing.  The patient is currently max +2 with mobility and basic ADLs.  Discharge setting and therapy post discharge at home is anticipated.  Patient has agreed to participate in the Acute Inpatient Rehabilitation Program and will admit today.  Preadmission Screen Completed By:  Michel Santee, PT, DPT 03/02/2019 10:28 AM ______________________________________________________________________   Discussed status with Dr. Dagoberto Ligas on 03/02/19  at 10:28 AM  and received approval for admission today.  Admission Coordinator:  Michel Santee, PT, DPT time 10:28 AM Sudie Grumbling 03/02/19    Assessment/Plan: Diagnosis: 1. Does the need for close, 24 hr/day Medical supervision in concert with the patient's rehab needs make it unreasonable for this patient to be served in a less intensive setting? Yes 2. Co-Morbidities requiring supervision/potential complications: R AKA, PVD, DM, CAD, macular degeneration 3. Due to bowel management, safety, skin/wound care, disease management, medication administration, pain management  and patient education, does the patient require 24 hr/day rehab nursing? Yes 4. Does the patient require coordinated care of a physician, rehab nurse, PT, OT, and SLP to address physical and functional deficits in the context of the above medical diagnosis(es)? Yes Addressing deficits in the following areas: balance, endurance, locomotion, strength, transferring, bathing, dressing, grooming, toileting, cognition and speech 5. Can the patient actively participate in an intensive therapy program of at least 3 hrs of therapy 5 days a week? Yes 6. The potential for patient to make measurable gains while on inpatient rehab is good 7. Anticipated functional outcomes upon discharge from inpatient rehab: modified independent and supervision PT, modified independent, supervision and n/a OT, n/a SLP 8. Estimated rehab length of stay to reach the above functional goals is: 18-21 days 9. Anticipated discharge destination: Home 10. Overall Rehab/Functional Prognosis: good   MD Signature:

## 2019-02-26 NOTE — Progress Notes (Signed)
Inpatient Rehab Admissions Coordinator:   I received denial notice from pt's insurance company.  I have begun process of expedited appeal.  Will f/u on Monday.   Shann Medal, PT, DPT Admissions Coordinator [phone] 02/26/19  5:05 PM

## 2019-02-26 NOTE — Progress Notes (Addendum)
Pt appeared to have painful red and purulent drainage on right groin incision. Clean with 0.9% NSS and painted with Betadine,4x4 dry gauzes applied. Vital remained stable, afebrile. No acute distress.Will continue to monitor.  Kennyth Lose, RN

## 2019-02-26 NOTE — Progress Notes (Addendum)
Progress Note    02/26/2019 7:36 AM 4 Days Post-Op  Subjective:  Awake and alert without complaints    Vitals:   02/26/19 0345 02/26/19 0600  BP: (!) 129/42 (!) 114/46  Pulse: 72   Resp: 14 15  Temp: 98.6 F (37 C)   SpO2: 94%     Physical Exam: Cardiac:  RRR Lungs:  CTA bil Incisions:  Right groin staples intact with serous drainage and brawny erythema. prox thigh incision intact with small amount of clear drainage. Left groin incision clean and dry Extremities:  Right aka suture line intact; flaps warm and well perfused Abdomen:  soft  CBC    Component Value Date/Time   WBC 3.0 (L) 02/26/2019 0312   RBC 3.08 (L) 02/26/2019 0312   HGB 9.5 (L) 02/26/2019 0312   HCT 27.7 (L) 02/26/2019 0312   PLT 104 (L) 02/26/2019 0312   MCV 89.9 02/26/2019 0312   MCH 30.8 02/26/2019 0312   MCHC 34.3 02/26/2019 0312   RDW 14.7 02/26/2019 0312   LYMPHSABS 1.6 11/17/2015 1228   MONOABS 0.6 11/17/2015 1228   EOSABS 0.1 11/17/2015 1228   BASOSABS 0.0 11/17/2015 1228    BMET    Component Value Date/Time   NA 142 02/24/2019 0321   NA 137 11/04/2018 1132   K 3.2 (L) 02/24/2019 0321   CL 115 (H) 02/24/2019 0321   CO2 20 (L) 02/24/2019 0321   GLUCOSE 92 02/24/2019 0321   BUN 16 02/24/2019 0321   BUN 24 11/04/2018 1132   CREATININE 1.03 (H) 02/24/2019 0321   CALCIUM 8.5 (L) 02/24/2019 0321   GFRNONAA 52 (L) 02/24/2019 0321   GFRAA 60 (L) 02/24/2019 0321     Intake/Output Summary (Last 24 hours) at 02/26/2019 0736 Last data filed at 02/26/2019 [id number] Gross per 24 hour  Intake 1180 ml  Output 3500 ml  Net -2320 ml    HOSPITAL MEDICATIONS Scheduled Meds: . aspirin EC  81 mg Oral Daily  . Chlorhexidine Gluconate Cloth  6 each Topical Daily  . cloNIDine  0.2 mg Oral QHS  . docusate sodium  100 mg Oral Daily  . heparin  5,000 Units Subcutaneous Q8H  . hydrALAZINE  25 mg Oral QHS  . insulin aspart  0-9 Units Subcutaneous TID AC & HS  . insulin glargine  15 Units  Subcutaneous QHS  . levothyroxine  50 mcg Oral QAC breakfast  . losartan  100 mg Oral Daily  . NIFEdipine  30 mg Oral Daily  . pantoprazole  40 mg Oral Daily  . simvastatin  40 mg Oral q1800  . sodium chloride flush  3 mL Intravenous Q12H  . sodium chloride flush  3 mL Intravenous Q12H  . torsemide  20 mg Oral Daily  . traZODone  100 mg Oral QHS   Continuous Infusions: . sodium chloride    . sodium chloride    . magnesium sulfate bolus IVPB     PRN Meds:.sodium chloride, sodium chloride, acetaminophen, alum & mag hydroxide-simeth, bisacodyl, guaiFENesin-dextromethorphan, HYDROmorphone (DILAUDID) injection, labetalol, magnesium sulfate bolus IVPB, metoprolol tartrate, morphine injection, ondansetron, oxyCODONE, phenol, polyethylene glycol, potassium chloride, pregabalin, senna-docusate  Assessment:  78 y.o. female is s/p:  left CFA pseudo repair, right fem - tp bypass with subsequent right aka due to ischemic foot.Good response to transfusion.  4 Days Post-Op  Plan: -Await plans for rehab.   Risa Grill, PA-C [facility] [phone] 02/26/2019  7:36 AM   I have independently interviewed and examined patient  and agree with PA assessment and plan above. Pending CIR. Keep dry dressings in groins bilaterally.  Mariama Saintvil C. [name], MD [facility] [place] Office: [phone] Pager: [phone]

## 2019-02-27 LAB — GLUCOSE, CAPILLARY
Glucose-Capillary: 118 mg/dL — ABNORMAL HIGH (ref 70–99)
Glucose-Capillary: 135 mg/dL — ABNORMAL HIGH (ref 70–99)
Glucose-Capillary: 113 mg/dL — ABNORMAL HIGH (ref 70–99)
Glucose-Capillary: 139 mg/dL — ABNORMAL HIGH (ref 70–99)

## 2019-02-27 MED ORDER — SODIUM CHLORIDE 0.9% FLUSH: 10.0000 mL | INTRAVENOUS | Status: DC | PRN

## 2019-02-27 MED ORDER — SODIUM CHLORIDE 0.9% FLUSH
10.0000 mL | Freq: Two times a day (BID) | INTRAVENOUS | Status: DC
  Administered 2019-02-27: 10 mL

## 2019-02-27 MED ORDER — VANCOMYCIN HCL IN DEXTROSE 1-5 GM/200ML-% IV SOLN
1000.0000 mg | INTRAVENOUS | Status: DC
  Administered 2019-02-28 – 2019-03-01 (×2): 1000 mg via INTRAVENOUS
  Filled 2019-02-27 (×3): qty 200

## 2019-02-27 MED ORDER — VANCOMYCIN HCL 1500 MG/300ML IV SOLN
1500.0000 mg | Freq: Once | INTRAVENOUS | Status: AC
  Administered 2019-02-28: 1500 mg via INTRAVENOUS
  Filled 2019-02-27: qty 300

## 2019-02-27 NOTE — Progress Notes (Signed)
Pharmacy Antibiotic Note  Debra Hopkins is a 78 y.o. female s/p R Fem-tp bypass, now with possible calf wound infection.  Pharmacy has been consulted for Vancomycin  dosing.  Plan: Vancomycin 1500 mg IV now, then 1000 mg IV q24h Est AUC 525  Height: 5\' 3"  (160 cm) Weight: 167 lb 5.3 oz (75.9 kg) IBW/kg (Calculated) : 52.4  Temp (24hrs), Avg:98.4 F (36.9 C), Min:98.1 F (36.7 C), Max:98.8 F (37.1 C)  Recent Labs  Lab 02/22/19 1220 02/23/19 0245 02/24/19 0321 02/25/19 0425 02/26/19 0312  WBC 2.8* 2.9* 2.4* 2.4* 3.0*  CREATININE 1.01* 1.03* 1.03*  --   --     Estimated Creatinine Clearance: 43.2 mL/min (A) (by C-G formula based on SCr of 1.03 mg/dL (H)).    Allergies  Allergen Reactions  . Amoxicillin-Pot Clavulanate Hives and Other (See Comments)    Has patient had a PCN reaction causing immediate rash, facial/tongue/throat swelling, SOB or lightheadedness with hypotension: UNKNOWN Has patient had a PCN reaction causing severe rash involving mucus membranes or skin necrosis: UNKNOWN Has patient had a PCN reaction that required hospitalization No Has patient had a PCN reaction occurring within the last 10 years: No If all of the above answers are "NO", then may proceed with Cephalosporin use.   . Byetta 10 Mcg Pen [Exenatide] Other (See Comments)    Unknown  . Ceclor [Cefaclor] Other (See Comments)    Unknown  . Celexa [Citalopram Hydrobromide] Other (See Comments)    Sick  . Ciprofloxacin Nausea Only and Other (See Comments)    Dizziness  . Claritin [Loratadine] Other (See Comments)    Hair fell out  . Erythromycin Other (See Comments)    REACTION: Upset stomach  . Flexeril [Cyclobenzaprine] Other (See Comments)    Nervous  . Fosamax [Alendronate] Other (See Comments)    Made pt sick  . Jardiance [Empagliflozin] Other (See Comments)    Vaginal infection, UTI  . Motrin [Ibuprofen] Nausea Only  . Naproxen Other (See Comments)    REACTION: severe nausea  .  Norvasc [Amlodipine Besylate] Other (See Comments)    Unknown  . Prednisone Other (See Comments)    REACTION: itchy rash  . Prozac [Fluoxetine Hcl] Other (See Comments)    Sick  . Sulfamethoxazole-Trimethoprim Nausea And Vomiting and Other (See Comments)    REACTION: Upset stomach    Caryl Pina 02/27/2019 11:48 PM

## 2019-02-27 NOTE — Progress Notes (Addendum)
[facility] [place]  Subjective  - No new complaints.   Objective (!) 109/49 86 98.6 F (37 C) (Oral) 10 98%  Intake/Output Summary (Last 24 hours) at 02/27/2019 0826 Last data filed at 02/26/2019 1700 Gross per 24 hour  Intake 220 ml  Output 2230 ml  Net -2010 ml    Right groin with C/D dressing changed by RN.  Clear drainage, no erythema or hematoma. Left groin staples intact healing well without drainage or hematoma. Left LE Doppler signal +PT/DP/Peroneal Right AKA healing well without evidence of ischemia. Lungs non labored breathing   Assessment/Planning:  78 y.o. female is s/p:  left CFA pseudo repair, right fem - tp bypass with subsequent right aka due to ischemic foot.Good response to transfusion.  5 Days Post-Op  Pending Appeal for rehab.possible Monday  Roxy Horseman 02/27/2019 8:26 AM --  Laboratory Lab Results: Recent Labs    02/25/19 0425 02/26/19 0312  WBC 2.4* 3.0*  HGB 8.8* 9.5*  HCT 26.0* 27.7*  PLT 92* 104*   BMET No results for input(s): NA, K, CL, CO2, GLUCOSE, BUN, CREATININE, CALCIUM in the last 72 hours.  COAG Lab Results  Component Value Date   INR 1.01 12/18/2016   INR 1.70 (H) 01/29/2010   INR 1.44 01/28/2010   No results found for: PTT  I agree with the above.  I have seen and evaluated the patient.  She does have some erythema around her vein harvest incision and so I will start her on vancomycin due to her penicillin allergy  Annamarie Major

## 2019-02-27 NOTE — TOC Initial Note (Addendum)
Transition of Care [facility]) - Initial/Assessment Note    Patient Details  Name: Debra Hopkins MRN: [id number] Date of Birth: 03-31-1940  Transition of Care [facility]) CM/SW Contact:    Benard Halsted, [place] Phone Number: 02/27/2019, 10:10 AM  Clinical Narrative:                 CSW spoke with patient regarding discharge plan. CSW discussed SNF with patient in the event that insurance continues to deny CIR placement. Patient reports that she is in agreement with SNF if CIR is unable to get approval. CSW will complete referral to prepare for bed offers and follow for insurance decision.   Expected Discharge Plan: IP Rehab Facility Barriers to Discharge: Continued Medical Work up, Ship broker   Patient Goals and CMS Choice Patient states their goals for this hospitalization and ongoing recovery are:: Feel better CMS Medicare.gov Compare Post Acute Care list provided to:: Patient Choice offered to / list presented to : Patient  Expected Discharge Plan and Services Expected Discharge Plan: [place] In-house Referral: Clinical Social Work   Post Acute Care Choice: IP Rehab Living arrangements for the past 2 months: Apartment Expected Discharge Date: 02/15/19                                    Prior Living Arrangements/Services Living arrangements for the past 2 months: Apartment Lives with:: Self Patient language and need for interpreter reviewed:: Yes Do you feel safe going back to the place where you live?: Yes      Need for Family Participation in Patient Care: No (Comment) Care giver support system in place?: Yes (comment)   Criminal Activity/Legal Involvement Pertinent to Current Situation/Hospitalization: No - Comment as needed  Activities of Daily Living Home Assistive Devices/Equipment: None ADL Screening (condition at time of admission) Patient's cognitive ability adequate to safely complete daily activities?: Yes Is the patient deaf or have  difficulty hearing?: No Does the patient have difficulty seeing, even when wearing glasses/contacts?: No Does the patient have difficulty concentrating, remembering, or making decisions?: No Patient able to express need for assistance with ADLs?: Yes Does the patient have difficulty dressing or bathing?: No Independently performs ADLs?: Yes (appropriate for developmental age) Does the patient have difficulty walking or climbing stairs?: No Weakness of Legs: Right Weakness of Arms/Hands: Right  Permission Sought/Granted Permission sought to share information with : Chartered certified accountant granted to share information with : Yes, Verbal Permission Granted     Permission granted to share info w AGENCY: SNFs        Emotional Assessment Appearance:: Appears stated age Attitude/Demeanor/Rapport: Engaged, Gracious Affect (typically observed): Pleasant, Accepting, Appropriate Orientation: : Oriented to Self, Oriented to Place, Oriented to  Time, Oriented to Situation Alcohol / Substance Use: Not Applicable Psych Involvement: No (comment)  Admission diagnosis:  Critical limb ischemia Patient Active Problem List   Diagnosis Date Noted  . PAD (peripheral artery disease) ([place]) 02/15/2019  . Type 2 diabetes mellitus with stage 3 chronic kidney disease, without long-term current use of insulin ([place]) 10/16/2015  . Obesity 10/16/2015  . Anemia 02/21/2015  . GERD (gastroesophageal reflux disease) 02/08/2014  . Cough 02/08/2014  . Dysphagia, unspecified(787.20) 09/27/2013  . Fibromyalgia 09/27/2013  . Hypothyroidism 09/27/2013  . Aftercare following surgery of the circulatory system, [place] 02/25/2012  . Occlusion and stenosis of carotid artery without mention of cerebral infarction 02/19/2011  .  CAD 07/31/2009  . Mixed hyperlipidemia 11/22/2008  . Essential hypertension, benign 11/22/2008  . PVD 11/22/2008  . DYSPNEA 11/22/2008   PCP:  Monico Blitz, MD Pharmacy:   [facility] [place], [place] [id number] [place] [id number] Phone: [phone] Fax: [phone]     Social Determinants of Health (SDOH) Interventions    Readmission Risk Interventions No flowsheet data found.

## 2019-02-27 NOTE — NC FL2 (Signed)
[place] MEDICAID FL2 LEVEL OF CARE SCREENING TOOL     IDENTIFICATION  Patient Name: NANDINI SHOLLEY Birthdate: June 23, 1940 Sex: female Admission Date (Current Location): 02/15/2019  [facility] and [place] Number:  [employer] and Address:  [facility], [place] [address] [place], [place] [address]      Provider Number: [id number]  Attending Physician Name and Address:  Thomes Lolling*  Relative Name and Phone Number:  [name], son, [phone]    Current Level of Care: Hospital Recommended Level of Care: [place] Prior Approval Number:    Date Approved/Denied:   PASRR Number: [id number]  Discharge Plan: SNF    Current Diagnoses: Patient Active Problem List   Diagnosis Date Noted  . PAD (peripheral artery disease) ([place]) 02/15/2019  . Type 2 diabetes mellitus with stage 3 chronic kidney disease, without long-term current use of insulin ([place]) 10/16/2015  . Obesity 10/16/2015  . Anemia 02/21/2015  . GERD (gastroesophageal reflux disease) 02/08/2014  . Cough 02/08/2014  . Dysphagia, unspecified(787.20) 09/27/2013  . Fibromyalgia 09/27/2013  . Hypothyroidism 09/27/2013  . Aftercare following surgery of the circulatory system, [place] 02/25/2012  . Occlusion and stenosis of carotid artery without mention of cerebral infarction 02/19/2011  . CAD 07/31/2009  . Mixed hyperlipidemia 11/22/2008  . Essential hypertension, benign 11/22/2008  . PVD 11/22/2008  . DYSPNEA 11/22/2008    Orientation RESPIRATION BLADDER Height & Weight     Self, Situation, Place  Normal Incontinent, External catheter Weight: 167 lb 5.3 oz (75.9 kg) Height:  5\' 3"  (160 cm)  BEHAVIORAL SYMPTOMS/MOOD NEUROLOGICAL BOWEL NUTRITION STATUS      Incontinent Diet(Please see DC Summary)  AMBULATORY STATUS COMMUNICATION OF NEEDS Skin   Extensive Assist Verbally Surgical wounds(Closed incision on leg and groin)                       Personal Care  Assistance Level of Assistance  Bathing, Feeding, Dressing Bathing Assistance: Maximum assistance Feeding assistance: Independent Dressing Assistance: Limited assistance     Functional Limitations Info  Sight, Hearing, Speech Sight Info: Impaired Hearing Info: Adequate Speech Info: Adequate    SPECIAL CARE FACTORS FREQUENCY  PT (By licensed PT), OT (By licensed OT)     PT Frequency: 5x OT Frequency: 5x            Contractures Contractures Info: Not present    Additional Factors Info  Code Status, Allergies, Psychotropic, Insulin Sliding Scale Code Status Info: Full Allergies Info: Amoxicillin-pot Clavulanate, Byetta 10 Mcg Pen (Exenatide), Ceclor (Cefaclor), Celexa (Citalopram Hydrobromide), Ciprofloxacin, Claritin (Loratadine), Erythromycin, Flexeril (Cyclobenzaprine), Fosamax (Alendronate), Jardiance (Empagliflozin), Motrin (Ibuprofen), Naproxen, Norvasc (Amlodipine Besylate), Prednisone, Prozac (Fluoxetine Hcl), Sulfamethoxazole-trimethoprim Psychotropic Info: Trazadone Insulin Sliding Scale Info: Insulin       Current Medications (02/27/2019):  This is the current hospital active medication list Current Facility-Administered Medications  Medication Dose Route Frequency Provider Last Rate Last Admin  . 0.9 %  sodium chloride infusion  250 mL Intravenous PRN Dagoberto Ligas, PA-C      . 0.9 %  sodium chloride infusion  500 mL Intravenous Once PRN Dagoberto Ligas, PA-C      . acetaminophen (TYLENOL) tablet 650 mg  650 mg Oral Q4H PRN Dagoberto Ligas, PA-C   650 mg at 02/18/19 2113  . alum & mag hydroxide-simeth (MAALOX/MYLANTA) 200-200-20 MG/5ML suspension 15-30 mL  15-30 mL Oral Q2H PRN Dagoberto Ligas, PA-C      . aspirin EC tablet 81  mg  81 mg Oral Daily Dagoberto Ligas, PA-C   81 mg at 02/27/19 1018  . bisacodyl (DULCOLAX) suppository 10 mg  10 mg Rectal Daily PRN Dagoberto Ligas, PA-C   10 mg at 02/17/19 2027  . Chlorhexidine Gluconate Cloth 2 % PADS 6 each  6  each Topical Daily Dagoberto Ligas, PA-C   6 each at 02/27/19 [id number]  . cloNIDine (CATAPRES) tablet 0.2 mg  0.2 mg Oral QHS Dagoberto Ligas, PA-C   0.2 mg at 02/26/19 2222  . docusate sodium (COLACE) capsule 100 mg  100 mg Oral Daily Dagoberto Ligas, PA-C   100 mg at 02/27/19 1019  . guaiFENesin-dextromethorphan (ROBITUSSIN DM) 100-10 MG/5ML syrup 15 mL  15 mL Oral Q4H PRN Dagoberto Ligas, PA-C      . heparin injection 5,000 Units  5,000 Units Subcutaneous Q8H Dagoberto Ligas, PA-C   5,000 Units at 02/27/19 0645  . hydrALAZINE (APRESOLINE) tablet 25 mg  25 mg Oral QHS Dagoberto Ligas, PA-C   25 mg at 02/26/19 2222  . HYDROmorphone (DILAUDID) injection 0.5-1 mg  0.5-1 mg Intravenous Q1H PRN Dagoberto Ligas, PA-C   1 mg at 02/27/19 0350  . insulin aspart (novoLOG) injection 0-9 Units  0-9 Units Subcutaneous TID AC & HS Waynetta Sandy, MD   1 Units at 02/26/19 2226  . insulin glargine (LANTUS) injection 15 Units  15 Units Subcutaneous QHS Dagoberto Ligas, PA-C   15 Units at 02/26/19 2227  . labetalol (NORMODYNE) injection 5 mg  5 mg Intravenous Q6H PRN Dagoberto Ligas, PA-C   5 mg at 02/24/19 1002  . levothyroxine (SYNTHROID) tablet 50 mcg  50 mcg Oral QAC breakfast Dagoberto Ligas, PA-C   50 mcg at 02/27/19 0645  . losartan (COZAAR) tablet 100 mg  100 mg Oral Daily Dagoberto Ligas, PA-C   100 mg at 02/27/19 1018  . magnesium sulfate IVPB 2 g 50 mL  2 g Intravenous Daily PRN Dagoberto Ligas, PA-C      . metoprolol tartrate (LOPRESSOR) injection 2-5 mg  2-5 mg Intravenous Q2H PRN Dagoberto Ligas, PA-C      . morphine 2 MG/ML injection 2 mg  2 mg Intravenous Q2H PRN Dagoberto Ligas, PA-C   2 mg at 02/25/19 2018  . NIFEdipine (PROCARDIA-XL/NIFEDICAL-XL) 24 hr tablet 30 mg  30 mg Oral Daily Dagoberto Ligas, PA-C   30 mg at 02/27/19 1019  . ondansetron (ZOFRAN) injection 4 mg  4 mg Intravenous Q6H PRN Dagoberto Ligas, PA-C      . oxyCODONE (Oxy IR/ROXICODONE) immediate release  tablet 5-10 mg  5-10 mg Oral Q4H PRN Dagoberto Ligas, PA-C   10 mg at 02/27/19 1019  . pantoprazole (PROTONIX) EC tablet 40 mg  40 mg Oral Daily Dagoberto Ligas, PA-C   40 mg at 02/27/19 1019  . phenol (CHLORASEPTIC) mouth spray 1 spray  1 spray Mouth/Throat PRN Dagoberto Ligas, PA-C      . polyethylene glycol (MIRALAX / GLYCOLAX) packet 17 g  17 g Oral Daily PRN Dagoberto Ligas, PA-C   17 g at 02/26/19 [id number]  . potassium chloride SA (KLOR-CON) CR tablet 20-40 mEq  20-40 mEq Oral Daily PRN Dagoberto Ligas, PA-C      . pregabalin (LYRICA) capsule 25 mg  25 mg Oral TID PRN Dagoberto Ligas, PA-C   25 mg at 02/26/19 2039  . senna-docusate (Senokot-S) tablet 1 tablet  1 tablet Oral QHS PRN Dagoberto Ligas, PA-C      . simvastatin (ZOCOR) tablet 40 mg  40 mg  Oral q1800 Dagoberto Ligas, PA-C   40 mg at 02/26/19 1727  . sodium chloride flush (NS) 0.9 % injection 3 mL  3 mL Intravenous Q12H Dagoberto Ligas, PA-C   3 mL at 02/25/19 2019  . sodium chloride flush (NS) 0.9 % injection 3 mL  3 mL Intravenous Q12H Dagoberto Ligas, PA-C   3 mL at 02/26/19 2230  . torsemide (DEMADEX) tablet 20 mg  20 mg Oral Daily Dagoberto Ligas, PA-C   20 mg at 02/27/19 1019  . traZODone (DESYREL) tablet 100 mg  100 mg Oral QHS Dagoberto Ligas, PA-C   100 mg at 02/26/19 2222     Discharge Medications: Please see discharge summary for a list of discharge medications.  Relevant Imaging Results:  Relevant Lab Results:   Additional Information SSN: [id number]      COVID negative on 12/3  [place], LCSW

## 2019-02-28 LAB — GLUCOSE, CAPILLARY
Glucose-Capillary: 104 mg/dL — ABNORMAL HIGH (ref 70–99)
Glucose-Capillary: 109 mg/dL — ABNORMAL HIGH (ref 70–99)
Glucose-Capillary: 150 mg/dL — ABNORMAL HIGH (ref 70–99)
Glucose-Capillary: 137 mg/dL — ABNORMAL HIGH (ref 70–99)

## 2019-02-28 NOTE — Progress Notes (Addendum)
[facility] [place]  Subjective  - No change, no new complaints.   Objective (!) 137/51 77 98.1 F (36.7 C) (Oral) 13 99%  Intake/Output Summary (Last 24 hours) at 02/28/2019 0801 Last data filed at 02/27/2019 1837 Gross per 24 hour  Intake 360 ml  Output 710 ml  Net -350 ml   Right groin with C/D dressing changed by RN.  Clear drainage, no erythema or hematoma. Left groin staples intact healing well without drainage or hematoma. Left LE Doppler signal +PT/DP/Peroneal Right AKA healing well without evidence of ischemia. Lungs non labored breathing   Assessment/Planning: 78 y.o.femaleis s/p:  left CFA pseudo repair, right fem - tp bypass with subsequent right aka due to ischemic foot.Good response to transfusion. 6 Days Post-Op  Pending Appeal for rehab.possible Monday  Roxy Horseman 02/28/2019 8:01 AM --  Laboratory Lab Results: Recent Labs    02/26/19 0312  WBC 3.0*  HGB 9.5*  HCT 27.7*  PLT 104*   BMET No results for input(s): NA, K, CL, CO2, GLUCOSE, BUN, CREATININE, CALCIUM in the last 72 hours.  COAG Lab Results  Component Value Date   INR 1.01 12/18/2016   INR 1.70 (H) 01/29/2010   INR 1.44 01/28/2010   No results found for: PTT  I agree with the above.  I have seen and evaluated the patient.  We started her on vancomycin yesterday secondary to erythema around her vein harvest incision.  This is improved today.  Her groin incision remains intact with decreased drainage.  Hopefully she will be able to go to rehab/SNF tomorrow.  Annamarie Major

## 2019-03-01 LAB — BASIC METABOLIC PANEL
Anion gap: 9 (ref 5–15)
BUN: 19 mg/dL (ref 8–23)
CO2: 26 mmol/L (ref 22–32)
Calcium: 8.4 mg/dL — ABNORMAL LOW (ref 8.9–10.3)
Chloride: 107 mmol/L (ref 98–111)
Creatinine, Ser: 0.96 mg/dL (ref 0.44–1.00)
GFR calc Af Amer: >60 mL/min (ref >60)
GFR calc non Af Amer: 56 mL/min — ABNORMAL LOW (ref >60)
Glucose, Bld: 118 mg/dL — ABNORMAL HIGH (ref 70–99)
Potassium: 2.8 mmol/L — ABNORMAL LOW (ref 3.5–5.1)
Sodium: 142 mmol/L (ref 135–145)

## 2019-03-01 LAB — GLUCOSE, CAPILLARY
Glucose-Capillary: 103 mg/dL — ABNORMAL HIGH (ref 70–99)
Glucose-Capillary: 127 mg/dL — ABNORMAL HIGH (ref 70–99)
Glucose-Capillary: 124 mg/dL — ABNORMAL HIGH (ref 70–99)
Glucose-Capillary: 128 mg/dL — ABNORMAL HIGH (ref 70–99)

## 2019-03-01 LAB — MAGNESIUM: Magnesium: 1.7 mg/dL (ref 1.7–2.4)

## 2019-03-01 MED ORDER — POTASSIUM CHLORIDE CRYS ER 20 MEQ PO TBCR
40.0000 meq | EXTENDED_RELEASE_TABLET | ORAL | Status: AC
  Administered 2019-03-01 (×2): 40 meq via ORAL
  Filled 2019-03-01 (×2): qty 2

## 2019-03-01 MED ORDER — MAGNESIUM SULFATE IN D5W 1-5 GM/100ML-% IV SOLN
1.0000 g | Freq: Once | INTRAVENOUS | Status: AC
  Administered 2019-03-01: 1 g via INTRAVENOUS
  Filled 2019-03-01: qty 100

## 2019-03-01 NOTE — Progress Notes (Signed)
Occupational Therapy Treatment Patient Details Name: Debra Hopkins MRN: [id number] DOB: 12-27-1940 Today's Date: 03/01/2019    History of present illness pt is a 78 y/o female with PMH significant for CAD, CA, DDD, DM, macular degeneration, TIA and PVD admitted with acute on chrionic right LE ischemia.  The day prior to admission she underwent stenting of her right SFA and popliteal arteries and now has mottling of the right foot.  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 Pt participatory in session and able to complete seated UB ADLs from EOB with set-up/ supervision assist. Pt complete sit>stand from EOB MAX A+2 and transferred to chair via stedy. Pt limited by pain and decreased activity tolerance this session with pt barely able to come to full stand for 2nd trial. Pt would benefit from CIR level therapies to maximize functional independence to return to PLOF as pt continues to make progress towards OT goals and is motivated to work with therapies. Will continue to follow acutely per POC.    Follow Up Recommendations  CIR    Equipment Recommendations  Wheelchair (measurements OT);Wheelchair cushion (measurements OT)    Recommendations for Other Services      Precautions / Restrictions Precautions Precautions: Fall Precaution Comments: R AKA Restrictions Weight Bearing Restrictions: Yes RLE Weight Bearing: Non weight bearing       Mobility Bed Mobility Overal bed mobility: Needs Assistance Bed Mobility: Supine to Sit     Supine to sit: HOB elevated;Min guard;Min assist;+2 for safety/equipment     General bed mobility comments: Min guard-MinA, Mod-Max VC to scoot to EOB, patient seems fearful of moving R residual limb and required encouragement to do so. Occasional MinA to scoot to EOB. Significantly  extended time/increased effort  Transfers Overall transfer level: Needs assistance Equipment used: Ambulation equipment used Transfers: Sit to/from Stand Sit to Stand: Max assist;+2 physical assistance   Squat pivot transfers: Total assist     General transfer comment: MaxAx2 to come to standing position with hips extended enough to clear stedy pads; on second stand MaxAx2 to barely clear stedy seat    Balance Overall balance assessment: Needs assistance Sitting-balance support: Feet supported;Bilateral upper extremity supported Sitting balance-Leahy Scale: Fair Sitting balance - Comments: S for safety   Standing balance support: Bilateral upper extremity supported;During functional activity Standing balance-Leahy Scale: Zero Standing balance comment: reliant on external support                           ADL either performed or assessed with clinical judgement   ADL Overall ADL's : Needs assistance/impaired     Grooming: Wash/dry face;Sitting;Set up               Lower Body Dressing: Maximal assistance;Sitting/lateral leans Lower Body Dressing Details (indicate cue type and reason): MAX A to don socks from bed level Toilet Transfer: Maximal assistance Toilet Transfer Details (indicate cue type and reason): MAX A with stedy for sit>stand           General ADL Comments: session focus on seated grooming tasks and functional mobility via stedy. Pt required MAX A +2 for sit>stand     Vision Baseline Vision/History: Wears glasses Wears Glasses: Reading only Patient Visual Report: No change from baseline     Perception  Praxis      Cognition Arousal/Alertness: Awake/alert Behavior During Therapy: Flat affect Overall Cognitive Status: Impaired/Different from baseline Area of Impairment: Orientation;Problem solving;Memory;Safety/judgement                 Orientation Level: Disoriented to;Time Current Attention Level: Sustained Memory:  Decreased recall of precautions;Decreased short-term memory   Safety/Judgement: Decreased awareness of safety   Problem Solving: Slow processing;Decreased initiation;Requires verbal cues;Requires tactile cues General Comments: very flat affect, clear STM deficits        Exercises     Shoulder Instructions       General Comments BP at EOB up to 188/100, RN assessed and rechecked BP where it improved to 132/105    Pertinent Vitals/ Pain       Pain Assessment: 0-10 Pain Score: 9  Pain Location: R LE, back Pain Descriptors / Indicators: Discomfort;Moaning;Guarding;Sore Pain Intervention(s): Limited activity within patient's tolerance;Monitored during session;Repositioned;Patient requesting pain meds-RN notified  Home Living                                          Prior Functioning/Environment              Frequency  Min 2X/week        Progress Toward Goals  OT Goals(current goals can now be found in the care plan section)  Progress towards OT goals: Progressing toward goals  Acute Rehab OT Goals Patient Stated Goal: improve mobility OT Goal Formulation: With patient Time For Goal Achievement: 03/03/19 Potential to Achieve Goals: Good  Plan Discharge plan remains appropriate    Co-evaluation      Reason for Co-Treatment: For patient/therapist safety;To address functional/ADL transfers PT goals addressed during session: Mobility/safety with mobility OT goals addressed during session: ADL's and self-care;Proper use of Adaptive equipment and DME      AM-PAC OT "6 Clicks" Daily Activity     Outcome Measure   Help from another person eating meals?: A Little Help from another person taking care of personal grooming?: A Little   Help from another person bathing (including washing, rinsing, drying)?: A Lot Help from another person to put on and taking off regular upper body clothing?: A Lot Help from another person to put on and taking off  regular lower body clothing?: Total 6 Click Score: 11    End of Session Equipment Utilized During Treatment: Gait belt;Other (comment)(stedy)  OT Visit Diagnosis: Other abnormalities of gait and mobility (R26.89);Muscle weakness (generalized) (M62.81);Pain Pain - Right/Left: Right Pain - part of body: Leg   Activity Tolerance Patient tolerated treatment well   Patient Left in chair;with call bell/phone within reach   Nurse Communication          Time: [id number] OT Time Calculation (min): 34 min  Charges: OT General Charges $OT Visit: 1 Visit OT Treatments $Self Care/Home Management : 8-22 mins  Lanier Clam., COTA/L Acute Rehabilitation Services [phone] [place] 03/01/2019, 12:56 PM

## 2019-03-01 NOTE — Progress Notes (Signed)
Physical Therapy Treatment Patient Details Name: Debra Hopkins MRN: [id number] DOB: [birth year] Today's Date: 03/01/2019    History of Present Illness pt is a 78 y/o female with PMH significant for CAD, CA, DDD, DM, macular degeneration, TIA and PVD admitted with acute on chrionic right LE ischemia.  The day prior to admission she underwent stenting of her right SFA and popliteal arteries and now has mottling of the right foot.  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 Actually able to complete bed mobility with min guard/occasional MinA and increased time/effort with HOB elevated, sat at EOB to wash face this morning. Did require MaxAx2 to stand high enough to clear stedy pads, then pivoted to chair totalA in stedy; on second stand from stedy pads, barely able to clear buttocks enough to remove stedy seat pads due to weakness. She was left up in the chair positioned to comfort with all needs met this morning, nursing staff aware of patient status.   Follow Up Recommendations  CIR     Equipment Recommendations  Wheelchair (measurements PT);Wheelchair cushion (measurements PT)    Recommendations for Other Services       Precautions / Restrictions Precautions Precautions: Fall Precaution Comments: R AKA Restrictions Weight Bearing Restrictions: Yes RLE Weight Bearing: Non weight bearing    Mobility  Bed Mobility Overal bed mobility: Needs Assistance Bed Mobility: Supine to Sit     Supine to sit: HOB elevated;Min guard;Min assist     General bed mobility comments: Min guard-MinA, Mod-Max VC to scoot to EOB, patient seems fearful of moving R residual limb and required encouragement to do so. Occasional MinA to scoot to EOB. Significantly extended time/increased  effort  Transfers Overall transfer level: Needs assistance Equipment used: Ambulation equipment used Transfers: Sit to/from Stand Sit to Stand: Max assist;+2 physical assistance   Squat pivot transfers: Total assist     General transfer comment: MaxAx2 to come to standing position with hips extended enough to clear stedy pads; on second stand MaxAx2 to barely clear stedy seat  Ambulation/Gait                 Stairs             Wheelchair Mobility    Modified Rankin (Stroke Patients Only)       Balance Overall balance assessment: Needs assistance Sitting-balance support: Feet supported;Bilateral upper extremity supported Sitting balance-Leahy Scale: Fair Sitting balance - Comments: S for safety   Standing balance support: Bilateral upper extremity supported;During functional activity Standing balance-Leahy Scale: Zero Standing balance comment: reliant on external support                            Cognition Arousal/Alertness: Awake/alert Behavior During Therapy: Flat affect   Area of Impairment: Orientation;Problem solving;Memory;Safety/judgement                 Orientation Level: Disoriented to;Time Current Attention Level: Sustained Memory: Decreased recall of precautions;Decreased short-term memory   Safety/Judgement: Decreased awareness of safety   Problem Solving: Slow processing;Decreased initiation;Requires verbal cues;Requires tactile cues General Comments: very flat affect, clear STM deficits      Exercises      General Comments General comments (skin integrity, edema,  etc.): BP at EOB up to 188/100, RN assessed and rechecked BP where it improved to 132/105      Pertinent Vitals/Pain Pain Assessment: 0-10 Pain Score: 9  Pain Location: R LE, back Pain Descriptors / Indicators: Discomfort;Moaning;Guarding;Sore Pain Intervention(s): Limited activity within patient's tolerance;Monitored during session;Premedicated  before session;Patient requesting pain meds-RN notified    Home Living                      Prior Function            PT Goals (current goals can now be found in the care plan section) Acute Rehab PT Goals Patient Stated Goal: improve mobility PT Goal Formulation: With patient Time For Goal Achievement: 03/09/19 Potential to Achieve Goals: Fair Progress towards PT goals: Progressing toward goals    Frequency    Min 3X/week      PT Plan Current plan remains appropriate    Co-evaluation PT/OT/SLP Co-Evaluation/Treatment: Yes Reason for Co-Treatment: Complexity of the patient's impairments (multi-system involvement);To address functional/ADL transfers PT goals addressed during session: Mobility/safety with mobility        AM-PAC PT "6 Clicks" Mobility   Outcome Measure  Help needed turning from your back to your side while in a flat bed without using bedrails?: A Little Help needed moving from lying on your back to sitting on the side of a flat bed without using bedrails?: A Little Help needed moving to and from a bed to a chair (including a wheelchair)?: A Lot Help needed standing up from a chair using your arms (e.g., wheelchair or bedside chair)?: Total Help needed to walk in hospital room?: Total Help needed climbing 3-5 steps with a railing? : Total 6 Click Score: 11    End of Session Equipment Utilized During Treatment: Gait belt Activity Tolerance: Patient tolerated treatment well Patient left: in chair;with call bell/phone within reach Nurse Communication: Mobility status;Need for lift equipment PT Visit Diagnosis: Other abnormalities of gait and mobility (R26.89);Pain;Difficulty in walking, not elsewhere classified (R26.2) Pain - Right/Left: Right Pain - part of body: Leg     Time: 7159-5396 PT Time Calculation (min) (ACUTE ONLY): 34 min  Charges:  $Therapeutic Activity: 8-22 mins                     Windell Norfolk, DPT, PN1   Supplemental  Physical Therapist [place]    Pager [phone] Acute Rehab Office [phone]

## 2019-03-01 NOTE — Progress Notes (Signed)
Dressings have been changed multiple times today. Right groin with staples, punctures areas from staples stretching and draining clear-yellow drainage. Area at right thigh inflamed with drainage. Surgical wound from AKA with minimal clear-yellow drainge. Staples at left groin without noted inflammation

## 2019-03-01 NOTE — Progress Notes (Addendum)
Progress Note    03/01/2019 7:14 AM 7 Days Post-Op  Subjective: Continues with c/o post-op pain.  Unable to give narcotics secondary to heart rate. She denies dizziness, light headedness or SOB, but has not been OOB.  Vancomycin restarted.    Vitals:   02/28/19 2324 03/01/19 0446  BP: (!) 149/56 (!) 155/50  Pulse: (!) 53 64  Resp: 19 10  Temp:  98.3 F (36.8 C)  SpO2: (!) 76% 99%    Physical Exam: Cardiac:  RRR Lungs:  CTA bil Incisions:  RLE: groin incision remains erythematous and draining serous fluid. Skin edges pulling away slightly; no dehisence. Prox thigh incision also erythematous, edematous. Distal thigh incision dry, well approximated.  AKA incision with new edema mid incision. Left groin incision healing without signs of infection.  Extremities:  LLE: no skin changes; + doppler PT, DP and AT pulses Abdomen:  soft  CBC    Component Value Date/Time   WBC 3.0 (L) 02/26/2019 0312   RBC 3.08 (L) 02/26/2019 0312   HGB 9.5 (L) 02/26/2019 0312   HCT 27.7 (L) 02/26/2019 0312   PLT 104 (L) 02/26/2019 0312   MCV 89.9 02/26/2019 0312   MCH 30.8 02/26/2019 0312   MCHC 34.3 02/26/2019 0312   RDW 14.7 02/26/2019 0312   LYMPHSABS 1.6 11/17/2015 1228   MONOABS 0.6 11/17/2015 1228   EOSABS 0.1 11/17/2015 1228   BASOSABS 0.0 11/17/2015 1228    BMET    Component Value Date/Time   NA 142 03/01/2019 0636   NA 137 11/04/2018 1132   K 2.8 (L) 03/01/2019 0636   CL 107 03/01/2019 0636   CO2 26 03/01/2019 0636   GLUCOSE 118 (H) 03/01/2019 0636   BUN 19 03/01/2019 0636   BUN 24 11/04/2018 1132   CREATININE 0.96 03/01/2019 0636   CALCIUM 8.4 (L) 03/01/2019 0636   GFRNONAA 56 (L) 03/01/2019 0636   GFRAA >60 03/01/2019 0636     Intake/Output Summary (Last 24 hours) at 03/01/2019 0714 Last data filed at 02/28/2019 2300 Gross per 24 hour  Intake 1123 ml  Output 800 ml  Net 323 ml    HOSPITAL MEDICATIONS Scheduled Meds: . aspirin EC  81 mg Oral Daily  .  Chlorhexidine Gluconate Cloth  6 each Topical Daily  . cloNIDine  0.2 mg Oral QHS  . docusate sodium  100 mg Oral Daily  . heparin  5,000 Units Subcutaneous Q8H  . hydrALAZINE  25 mg Oral QHS  . insulin aspart  0-9 Units Subcutaneous TID AC & HS  . insulin glargine  15 Units Subcutaneous QHS  . levothyroxine  50 mcg Oral QAC breakfast  . losartan  100 mg Oral Daily  . NIFEdipine  30 mg Oral Daily  . pantoprazole  40 mg Oral Daily  . simvastatin  40 mg Oral q1800  . sodium chloride flush  10-40 mL Intracatheter Q12H  . sodium chloride flush  3 mL Intravenous Q12H  . sodium chloride flush  3 mL Intravenous Q12H  . torsemide  20 mg Oral Daily  . traZODone  100 mg Oral QHS   Continuous Infusions: . sodium chloride    . sodium chloride    . magnesium sulfate bolus IVPB    . vancomycin 1,000 mg (02/28/19 2115)   PRN Meds:.sodium chloride, sodium chloride, acetaminophen, alum & mag hydroxide-simeth, bisacodyl, guaiFENesin-dextromethorphan, HYDROmorphone (DILAUDID) injection, labetalol, magnesium sulfate bolus IVPB, metoprolol tartrate, morphine injection, ondansetron, oxyCODONE, phenol, polyethylene glycol, potassium chloride, pregabalin, senna-docusate, sodium chloride flush  Assessment:  78 y.o. female is s/p:  left CFA pseudo repair, right fem - tp bypass with subsequent right aka due to ischemic foot. Right groin macerated. Monitor heart rate/symptoms. Recheck labs 7 Days Post-Op  Plan: -Monitor heart rate/symptoms. Recheck labs     Risa Grill, [place] [facility] [phone] 03/01/2019  7:14 AM   I have examined the patient, reviewed and agree with above.  Comfortable overall.  Right above-knee amputation healing.  Left groin wound healing.  Some maceration in the right groin with serous drainage and some erythema.  Upper thigh vein harvest also with surrounding erythema.  On vancomycin.  Curt Jews, MD 03/01/2019 4:10 PM

## 2019-03-01 NOTE — Progress Notes (Signed)
Inpatient Rehab Admissions Coordinator:   Updated pt on status of appeal.  Will continue to follow.   Shann Medal, PT, DPT Admissions Coordinator [phone] 03/01/19  10:32 AM

## 2019-03-02 ENCOUNTER — Encounter (HOSPITAL_COMMUNITY): Payer: Self-pay | Admitting: Physical Medicine and Rehabilitation

## 2019-03-02 ENCOUNTER — Inpatient Hospital Stay (HOSPITAL_COMMUNITY)
Admission: RE | Admit: 2019-03-02 | Discharge: 2019-03-15 | DRG: 464 | Disposition: A | Discharge status: Other Acute Inpatient Hospital | Payer: Medicare Other | Source: Intra-hospital | Attending: Physical Medicine and Rehabilitation | Admitting: Physical Medicine and Rehabilitation

## 2019-03-02 ENCOUNTER — Other Ambulatory Visit: Payer: Self-pay

## 2019-03-02 DIAGNOSIS — E039 Hypothyroidism, unspecified: Secondary | ICD-10-CM | POA: Diagnosis present

## 2019-03-02 DIAGNOSIS — T8144XA Sepsis following a procedure, initial encounter: Secondary | ICD-10-CM | POA: Diagnosis not present

## 2019-03-02 DIAGNOSIS — D72819 Decreased white blood cell count, unspecified: Secondary | ICD-10-CM

## 2019-03-02 DIAGNOSIS — E1122 Type 2 diabetes mellitus with diabetic chronic kidney disease: Secondary | ICD-10-CM | POA: Diagnosis present

## 2019-03-02 DIAGNOSIS — E876 Hypokalemia: Secondary | ICD-10-CM | POA: Diagnosis not present

## 2019-03-02 DIAGNOSIS — Y838 Other surgical procedures as the cause of abnormal reaction of the patient, or of later complication, without mention of misadventure at the time of the procedure: Secondary | ICD-10-CM | POA: Diagnosis not present

## 2019-03-02 DIAGNOSIS — I739 Peripheral vascular disease, unspecified: Secondary | ICD-10-CM | POA: Diagnosis present

## 2019-03-02 DIAGNOSIS — Z4781 Encounter for orthopedic aftercare following surgical amputation: Principal | ICD-10-CM

## 2019-03-02 DIAGNOSIS — Z801 Family history of malignant neoplasm of trachea, bronchus and lung: Secondary | ICD-10-CM

## 2019-03-02 DIAGNOSIS — Z794 Long term (current) use of insulin: Secondary | ICD-10-CM | POA: Diagnosis not present

## 2019-03-02 DIAGNOSIS — R339 Retention of urine, unspecified: Secondary | ICD-10-CM | POA: Diagnosis present

## 2019-03-02 DIAGNOSIS — Z7982 Long term (current) use of aspirin: Secondary | ICD-10-CM

## 2019-03-02 DIAGNOSIS — Z79890 Hormone replacement therapy (postmenopausal): Secondary | ICD-10-CM

## 2019-03-02 DIAGNOSIS — E1151 Type 2 diabetes mellitus with diabetic peripheral angiopathy without gangrene: Secondary | ICD-10-CM | POA: Diagnosis present

## 2019-03-02 DIAGNOSIS — R7309 Other abnormal glucose: Secondary | ICD-10-CM

## 2019-03-02 DIAGNOSIS — Z833 Family history of diabetes mellitus: Secondary | ICD-10-CM

## 2019-03-02 DIAGNOSIS — L89150 Pressure ulcer of sacral region, unstageable: Secondary | ICD-10-CM | POA: Diagnosis not present

## 2019-03-02 DIAGNOSIS — N183 Chronic kidney disease, stage 3 unspecified: Secondary | ICD-10-CM | POA: Diagnosis present

## 2019-03-02 DIAGNOSIS — K5900 Constipation, unspecified: Secondary | ICD-10-CM | POA: Diagnosis present

## 2019-03-02 DIAGNOSIS — Z89611 Acquired absence of right leg above knee: Secondary | ICD-10-CM

## 2019-03-02 DIAGNOSIS — N179 Acute kidney failure, unspecified: Secondary | ICD-10-CM | POA: Diagnosis present

## 2019-03-02 DIAGNOSIS — L8990 Pressure ulcer of unspecified site, unspecified stage: Secondary | ICD-10-CM | POA: Insufficient documentation

## 2019-03-02 DIAGNOSIS — D61818 Other pancytopenia: Secondary | ICD-10-CM | POA: Diagnosis present

## 2019-03-02 DIAGNOSIS — T8141XA Infection following a procedure, superficial incisional surgical site, initial encounter: Secondary | ICD-10-CM | POA: Diagnosis not present

## 2019-03-02 DIAGNOSIS — D649 Anemia, unspecified: Secondary | ICD-10-CM | POA: Diagnosis present

## 2019-03-02 DIAGNOSIS — K219 Gastro-esophageal reflux disease without esophagitis: Secondary | ICD-10-CM | POA: Diagnosis present

## 2019-03-02 DIAGNOSIS — Z89511 Acquired absence of right leg below knee: Secondary | ICD-10-CM | POA: Diagnosis not present

## 2019-03-02 DIAGNOSIS — G479 Sleep disorder, unspecified: Secondary | ICD-10-CM

## 2019-03-02 DIAGNOSIS — I96 Gangrene, not elsewhere classified: Secondary | ICD-10-CM | POA: Diagnosis not present

## 2019-03-02 DIAGNOSIS — Z8673 Personal history of transient ischemic attack (TIA), and cerebral infarction without residual deficits: Secondary | ICD-10-CM

## 2019-03-02 DIAGNOSIS — Z79899 Other long term (current) drug therapy: Secondary | ICD-10-CM

## 2019-03-02 DIAGNOSIS — D62 Acute posthemorrhagic anemia: Secondary | ICD-10-CM | POA: Diagnosis present

## 2019-03-02 DIAGNOSIS — R609 Edema, unspecified: Secondary | ICD-10-CM | POA: Diagnosis not present

## 2019-03-02 DIAGNOSIS — Z8249 Family history of ischemic heart disease and other diseases of the circulatory system: Secondary | ICD-10-CM

## 2019-03-02 DIAGNOSIS — E1121 Type 2 diabetes mellitus with diabetic nephropathy: Secondary | ICD-10-CM | POA: Diagnosis not present

## 2019-03-02 DIAGNOSIS — G546 Phantom limb syndrome with pain: Secondary | ICD-10-CM | POA: Diagnosis present

## 2019-03-02 DIAGNOSIS — I10 Essential (primary) hypertension: Secondary | ICD-10-CM | POA: Diagnosis present

## 2019-03-02 DIAGNOSIS — Z823 Family history of stroke: Secondary | ICD-10-CM

## 2019-03-02 DIAGNOSIS — D696 Thrombocytopenia, unspecified: Secondary | ICD-10-CM | POA: Diagnosis present

## 2019-03-02 DIAGNOSIS — I2510 Atherosclerotic heart disease of native coronary artery without angina pectoris: Secondary | ICD-10-CM | POA: Diagnosis present

## 2019-03-02 DIAGNOSIS — M797 Fibromyalgia: Secondary | ICD-10-CM | POA: Diagnosis present

## 2019-03-02 DIAGNOSIS — I9789 Other postprocedural complications and disorders of the circulatory system, not elsewhere classified: Secondary | ICD-10-CM | POA: Diagnosis not present

## 2019-03-02 DIAGNOSIS — E782 Mixed hyperlipidemia: Secondary | ICD-10-CM | POA: Diagnosis present

## 2019-03-02 DIAGNOSIS — N1831 Chronic kidney disease, stage 3a: Secondary | ICD-10-CM | POA: Diagnosis not present

## 2019-03-02 DIAGNOSIS — Z888 Allergy status to other drugs, medicaments and biological substances status: Secondary | ICD-10-CM

## 2019-03-02 DIAGNOSIS — E1142 Type 2 diabetes mellitus with diabetic polyneuropathy: Secondary | ICD-10-CM

## 2019-03-02 DIAGNOSIS — D619 Aplastic anemia, unspecified: Secondary | ICD-10-CM | POA: Diagnosis not present

## 2019-03-02 DIAGNOSIS — I129 Hypertensive chronic kidney disease with stage 1 through stage 4 chronic kidney disease, or unspecified chronic kidney disease: Secondary | ICD-10-CM | POA: Diagnosis present

## 2019-03-02 DIAGNOSIS — E785 Hyperlipidemia, unspecified: Secondary | ICD-10-CM | POA: Diagnosis present

## 2019-03-02 DIAGNOSIS — Z87891 Personal history of nicotine dependence: Secondary | ICD-10-CM

## 2019-03-02 LAB — CBC
HCT: 29.2 % — ABNORMAL LOW (ref 36.0–46.0)
Hemoglobin: 9.3 g/dL — ABNORMAL LOW (ref 12.0–15.0)
MCH: 30.9 pg (ref 26.0–34.0)
MCHC: 31.8 g/dL (ref 30.0–36.0)
MCV: 97 fL (ref 80.0–100.0)
Platelets: 86 10*3/uL — ABNORMAL LOW (ref 150–400)
RBC: 3.01 MIL/uL — ABNORMAL LOW (ref 3.87–5.11)
RDW: 14.3 % (ref 11.5–15.5)
WBC: 2.5 10*3/uL — ABNORMAL LOW (ref 4.0–10.5)
nRBC: 0 % (ref 0.0–0.2)

## 2019-03-02 LAB — BASIC METABOLIC PANEL
Anion gap: 7 (ref 5–15)
BUN: 18 mg/dL (ref 8–23)
CO2: 25 mmol/L (ref 22–32)
Calcium: 8.6 mg/dL — ABNORMAL LOW (ref 8.9–10.3)
Chloride: 108 mmol/L (ref 98–111)
Creatinine, Ser: 1.04 mg/dL — ABNORMAL HIGH (ref 0.44–1.00)
GFR calc Af Amer: 59 mL/min — ABNORMAL LOW (ref >60)
GFR calc non Af Amer: 51 mL/min — ABNORMAL LOW (ref >60)
Glucose, Bld: 59 mg/dL — ABNORMAL LOW (ref 70–99)
Potassium: 3.8 mmol/L (ref 3.5–5.1)
Sodium: 140 mmol/L (ref 135–145)

## 2019-03-02 LAB — GLUCOSE, CAPILLARY
Glucose-Capillary: 55 mg/dL — ABNORMAL LOW (ref 70–99)
Glucose-Capillary: 90 mg/dL (ref 70–99)
Glucose-Capillary: 100 mg/dL — ABNORMAL HIGH (ref 70–99)
Glucose-Capillary: 117 mg/dL — ABNORMAL HIGH (ref 70–99)
Glucose-Capillary: 143 mg/dL — ABNORMAL HIGH (ref 70–99)

## 2019-03-02 LAB — MAGNESIUM: Magnesium: 2 mg/dL (ref 1.7–2.4)

## 2019-03-02 MED ORDER — CLONIDINE HCL 0.2 MG PO TABS
0.2000 mg | ORAL_TABLET | Freq: Every day | ORAL | Status: DC
  Administered 2019-03-02 – 2019-03-15 (×13): 0.2 mg via ORAL
  Filled 2019-03-02 (×14): qty 1

## 2019-03-02 MED ORDER — VANCOMYCIN HCL IN DEXTROSE 1-5 GM/200ML-% IV SOLN
1000.0000 mg | INTRAVENOUS | Status: DC
  Administered 2019-03-02 – 2019-03-07 (×6): 1000 mg via INTRAVENOUS
  Filled 2019-03-02 (×7): qty 200

## 2019-03-02 MED ORDER — ZINC SULFATE 220 (50 ZN) MG PO CAPS
220.0000 mg | ORAL_CAPSULE | Freq: Every day | ORAL | Status: DC
  Administered 2019-03-02 – 2019-03-15 (×13): 220 mg via ORAL
  Filled 2019-03-02 (×16): qty 1

## 2019-03-02 MED ORDER — OXYCODONE HCL 5 MG PO TABS
5.0000 mg | ORAL_TABLET | ORAL | Status: DC | PRN
  Administered 2019-03-03 (×4): 5 mg via ORAL
  Administered 2019-03-04 – 2019-03-08 (×13): 10 mg via ORAL
  Administered 2019-03-09 – 2019-03-10 (×2): 5 mg via ORAL
  Administered 2019-03-10 – 2019-03-13 (×8): 10 mg via ORAL
  Filled 2019-03-02 (×3): qty 2
  Filled 2019-03-02: qty 1
  Filled 2019-03-02 (×14): qty 2
  Filled 2019-03-02: qty 1
  Filled 2019-03-02 (×3): qty 2
  Filled 2019-03-02: qty 1
  Filled 2019-03-02: qty 2
  Filled 2019-03-02 (×2): qty 1
  Filled 2019-03-02 (×2): qty 2

## 2019-03-02 MED ORDER — HEPARIN SODIUM (PORCINE) 5000 UNIT/ML IJ SOLN
5000.0000 [IU] | Freq: Three times a day (TID) | INTRAMUSCULAR | Status: DC
  Administered 2019-03-02 – 2019-03-15 (×38): 5000 [IU] via SUBCUTANEOUS
  Filled 2019-03-02 (×42): qty 1

## 2019-03-02 MED ORDER — HEPARIN SODIUM (PORCINE) 5000 UNIT/ML IJ SOLN: 5000.0000 [IU] | Freq: Three times a day (TID) | INTRAMUSCULAR | Status: DC

## 2019-03-02 MED ORDER — NIFEDIPINE ER OSMOTIC RELEASE 30 MG PO TB24
30.0000 mg | ORAL_TABLET | Freq: Every day | ORAL | Status: DC
  Administered 2019-03-03 – 2019-03-15 (×13): 30 mg via ORAL
  Filled 2019-03-02 (×14): qty 1

## 2019-03-02 MED ORDER — BISACODYL 10 MG RE SUPP
10.0000 mg | Freq: Every day | RECTAL | Status: DC | PRN
  Administered 2019-03-11: 10 mg via RECTAL
  Filled 2019-03-02: qty 1

## 2019-03-02 MED ORDER — POLYETHYLENE GLYCOL 3350 17 G PO PACK
17.0000 g | PACK | Freq: Every day | ORAL | Status: DC | PRN
  Administered 2019-03-11: 07:00:00 17 g via ORAL
  Filled 2019-03-02: qty 1

## 2019-03-02 MED ORDER — PNEUMOCOCCAL VAC POLYVALENT 25 MCG/0.5ML IJ INJ
0.5000 mL | INJECTION | INTRAMUSCULAR | Status: DC
  Filled 2019-03-02: qty 0.5

## 2019-03-02 MED ORDER — TORSEMIDE 20 MG PO TABS
20.0000 mg | ORAL_TABLET | Freq: Every day | ORAL | Status: DC
  Administered 2019-03-03 – 2019-03-11 (×9): 20 mg via ORAL
  Filled 2019-03-02 (×10): qty 1

## 2019-03-02 MED ORDER — LEVOTHYROXINE SODIUM 25 MCG PO TABS
50.0000 ug | ORAL_TABLET | Freq: Every day | ORAL | Status: DC
  Administered 2019-03-03 – 2019-03-15 (×12): 50 ug via ORAL
  Filled 2019-03-02 (×15): qty 2

## 2019-03-02 MED ORDER — DOCUSATE SODIUM 100 MG PO CAPS
100.0000 mg | ORAL_CAPSULE | Freq: Every day | ORAL | Status: DC
  Administered 2019-03-03 – 2019-03-15 (×13): 100 mg via ORAL
  Filled 2019-03-02 (×13): qty 1

## 2019-03-02 MED ORDER — INSULIN GLARGINE 100 UNIT/ML ~~LOC~~ SOLN: 10.0000 [IU] | Freq: Every day | SUBCUTANEOUS | 11 refills | Status: DC

## 2019-03-02 MED ORDER — PANTOPRAZOLE SODIUM 40 MG PO TBEC
40.0000 mg | DELAYED_RELEASE_TABLET | Freq: Every day | ORAL | Status: DC
  Administered 2019-03-03 – 2019-03-15 (×13): 40 mg via ORAL
  Filled 2019-03-02 (×13): qty 1

## 2019-03-02 MED ORDER — ENSURE MAX PROTEIN PO LIQD
11.0000 [oz_av] | Freq: Two times a day (BID) | ORAL | Status: DC
  Administered 2019-03-02: 11 [oz_av] via ORAL
  Filled 2019-03-02 (×2): qty 330

## 2019-03-02 MED ORDER — ENSURE ENLIVE PO LIQD: 237.0000 mL | Freq: Two times a day (BID) | ORAL | Status: DC

## 2019-03-02 MED ORDER — HYDRALAZINE HCL 25 MG PO TABS
25.0000 mg | ORAL_TABLET | Freq: Every day | ORAL | Status: DC
  Administered 2019-03-02 – 2019-03-04 (×3): 25 mg via ORAL
  Filled 2019-03-02 (×3): qty 1

## 2019-03-02 MED ORDER — ACETAMINOPHEN 325 MG PO TABS
650.0000 mg | ORAL_TABLET | ORAL | Status: DC | PRN
  Administered 2019-03-03 – 2019-03-04 (×2): 650 mg via ORAL
  Filled 2019-03-02 (×2): qty 2

## 2019-03-02 MED ORDER — INSULIN GLARGINE 100 UNIT/ML ~~LOC~~ SOLN
10.0000 [IU] | Freq: Every day | SUBCUTANEOUS | Status: DC
  Filled 2019-03-02: qty 0.1

## 2019-03-02 MED ORDER — PREGABALIN 25 MG PO CAPS: 25.0000 mg | ORAL_CAPSULE | Freq: Three times a day (TID) | ORAL | Status: DC | PRN

## 2019-03-02 MED ORDER — TRAZODONE HCL 50 MG PO TABS
100.0000 mg | ORAL_TABLET | Freq: Every day | ORAL | Status: DC
  Administered 2019-03-02 – 2019-03-07 (×6): 100 mg via ORAL
  Filled 2019-03-02 (×6): qty 2

## 2019-03-02 MED ORDER — LOSARTAN POTASSIUM 50 MG PO TABS
100.0000 mg | ORAL_TABLET | Freq: Every day | ORAL | Status: DC
  Administered 2019-03-03 – 2019-03-15 (×13): 100 mg via ORAL
  Filled 2019-03-02 (×13): qty 2

## 2019-03-02 MED ORDER — GUAIFENESIN-DM 100-10 MG/5ML PO SYRP: 15.0000 mL | ORAL_SOLUTION | ORAL | Status: DC | PRN

## 2019-03-02 MED ORDER — INSULIN ASPART 100 UNIT/ML ~~LOC~~ SOLN
0.0000 [IU] | Freq: Three times a day (TID) | SUBCUTANEOUS | Status: DC
  Administered 2019-03-02: 22:00:00 1 [IU] via SUBCUTANEOUS
  Administered 2019-03-03 (×2): 2 [IU] via SUBCUTANEOUS
  Administered 2019-03-03: 22:00:00 1 [IU] via SUBCUTANEOUS
  Administered 2019-03-03: 12:00:00 3 [IU] via SUBCUTANEOUS
  Administered 2019-03-04: 18:00:00 2 [IU] via SUBCUTANEOUS
  Administered 2019-03-04 – 2019-03-05 (×5): 1 [IU] via SUBCUTANEOUS
  Administered 2019-03-05 – 2019-03-06 (×3): 2 [IU] via SUBCUTANEOUS
  Administered 2019-03-06 – 2019-03-07 (×3): 1 [IU] via SUBCUTANEOUS
  Administered 2019-03-07: 13:00:00 3 [IU] via SUBCUTANEOUS
  Administered 2019-03-07: 1 [IU] via SUBCUTANEOUS
  Administered 2019-03-07: 22:00:00 2 [IU] via SUBCUTANEOUS
  Administered 2019-03-08 (×2): 1 [IU] via SUBCUTANEOUS
  Administered 2019-03-08: 2 [IU] via SUBCUTANEOUS
  Administered 2019-03-08: 22:00:00 1 [IU] via SUBCUTANEOUS
  Administered 2019-03-09: 2 [IU] via SUBCUTANEOUS
  Administered 2019-03-10: 13:00:00 3 [IU] via SUBCUTANEOUS
  Administered 2019-03-10: 5 [IU] via SUBCUTANEOUS
  Administered 2019-03-10 – 2019-03-11 (×5): 2 [IU] via SUBCUTANEOUS
  Administered 2019-03-12: 3 [IU] via SUBCUTANEOUS
  Administered 2019-03-12 (×2): 2 [IU] via SUBCUTANEOUS
  Administered 2019-03-12: 5 [IU] via SUBCUTANEOUS
  Administered 2019-03-13: 3 [IU] via SUBCUTANEOUS
  Administered 2019-03-13 (×3): 2 [IU] via SUBCUTANEOUS
  Administered 2019-03-14: 3 [IU] via SUBCUTANEOUS
  Administered 2019-03-14 (×2): 2 [IU] via SUBCUTANEOUS
  Administered 2019-03-14: 5 [IU] via SUBCUTANEOUS
  Administered 2019-03-15 (×2): 1 [IU] via SUBCUTANEOUS
  Administered 2019-03-15 (×2): 2 [IU] via SUBCUTANEOUS

## 2019-03-02 MED ORDER — SIMVASTATIN 20 MG PO TABS
40.0000 mg | ORAL_TABLET | Freq: Every day | ORAL | Status: DC
  Administered 2019-03-02 – 2019-03-15 (×14): 40 mg via ORAL
  Filled 2019-03-02 (×14): qty 2

## 2019-03-02 MED ORDER — ASPIRIN EC 81 MG PO TBEC
81.0000 mg | DELAYED_RELEASE_TABLET | Freq: Every day | ORAL | Status: DC
  Administered 2019-03-03 – 2019-03-15 (×12): 81 mg via ORAL
  Filled 2019-03-02 (×12): qty 1

## 2019-03-02 MED ORDER — SORBITOL 70 % SOLN
30.0000 mL | Freq: Every day | Status: DC | PRN
  Administered 2019-03-11 – 2019-03-13 (×2): 30 mL via ORAL
  Filled 2019-03-02 (×2): qty 30

## 2019-03-02 MED ORDER — ASCORBIC ACID 500 MG PO TABS
500.0000 mg | ORAL_TABLET | Freq: Two times a day (BID) | ORAL | Status: DC
  Administered 2019-03-02 – 2019-03-15 (×27): 500 mg via ORAL
  Filled 2019-03-02 (×27): qty 1

## 2019-03-02 NOTE — Plan of Care (Signed)
  Problem: Consults Goal: RH LIMB LOSS PATIENT EDUCATION Description: Description: See Patient Education module for eduction specifics. Outcome: Progressing Goal: Skin Care Protocol Initiated - if Braden Score 18 or less Description: If consults are not indicated, leave blank or document N/A Outcome: Progressing Goal: Nutrition Consult-if indicated Outcome: Progressing Goal: Diabetes Guidelines if Diabetic/Glucose > 140 Description: If diabetic or lab glucose is > 140 mg/dl - Initiate Diabetes/Hyperglycemia Guidelines & Document Interventions  Outcome: Progressing   Problem: RH BOWEL ELIMINATION Goal: RH STG MANAGE BOWEL WITH ASSISTANCE Description: STG Manage Bowel with moderate Assistance. Outcome: Progressing

## 2019-03-02 NOTE — Progress Notes (Signed)
PT Cancellation Note  Patient Details Name: EULALA ISHAQ MRN: [id number] DOB: May 08, 1940   Cancelled Treatment:    Reason Eval/Treat Not Completed: Other (comment). Noted pt to transfer to CIR today; will defer treatment.  Mabeline Caras, PT, DPT Acute Rehabilitation Services  Pager [phone] Office Mark 03/02/2019, 12:42 PM

## 2019-03-02 NOTE — Progress Notes (Signed)
Wound examined. Patient with significant pain right groin--staples reported to have been discontinued today and moderate amount of serous drainage noted from groin and min drainage from right thigh wound.   Right groin incision   Right groin and thigh incisions.   Left groin incision.

## 2019-03-02 NOTE — Progress Notes (Signed)
Inpatient Rehab Admissions Coordinator:   I have insurance authorization and approval from Risa Grill, [place] with [facility], for pt to admit today.  I will let pt/family, RN, and CM know.   Shann Medal, PT, DPT Admissions Coordinator [phone] 03/02/19  9:58 AM

## 2019-03-02 NOTE — Progress Notes (Signed)
Inpatient Diabetes Program Recommendations  AACE/ADA: New Consensus Statement on Inpatient Glycemic Control (2015)  Target Ranges:  Prepandial:   less than 140 mg/dL      Peak postprandial:   less than 180 mg/dL (1-2 hours)      Critically ill patients:  140 - 180 mg/dL   Lab Results  Component Value Date   GLUCAP 90 03/02/2019   HGBA1C 5.9 (H) 02/17/2019    Review of Glycemic Control  Results for JESSIANA, EICHORN (MRN [id number]) as of 03/02/2019 09:27  Ref. Range 03/01/2019 11:14 03/01/2019 16:14 03/01/2019 21:29 03/02/2019 06:20 03/02/2019 06:53  Glucose-Capillary Latest Ref Range: 70 - 99 mg/dL 127 (H) 124 (H) 128 (H) 55 (L) 90    Diabetes history: DM2 Outpatient Diabetes medications: Lantus 30 units at bedtime Current orders for Inpatient glycemic control: Lantus 15 at bedtime; Novolog 0-9 TID with meals  Inpatient Diabetes Program Recommendations:  Note: Fasting CBG 55 today & 56 on 02/26/19  -Please consider Lantus 10 units at bedtime  Thank you, Geoffry Paradise, RN, BSN Diabetes Coordinator Inpatient Diabetes Program [phone] (team pager from 8a-5p)

## 2019-03-02 NOTE — TOC Transition Note (Signed)
Transition of Care [facility]) - CM/SW Discharge Note Marvetta Gibbons RN, BSN Transitions of Care Unit 4E- RN Case Manager [phone]   Patient Details  Name: Debra Hopkins MRN: [id number] Date of Birth: [birth year]  Transition of Care [facility]) CM/SW Contact:  Dawayne Patricia, RN Phone Number: 03/02/2019, 3:57 PM   Clinical Narrative:    Received notice from CIR that appeal has been won for insurance approval to go to [place] rehab, pt has bed available today and will transition to CIR later this afternoon.    Final next level of care: IP Rehab Facility Barriers to Discharge: Barriers Resolved   Patient Goals and CMS Choice Patient states their goals for this hospitalization and ongoing recovery are:: Feel better CMS Medicare.gov Compare Post Acute Care list provided to:: Patient Choice offered to / list presented to : Patient  Discharge Placement                 Rehab- [facility]       Discharge Plan and Services In-house Referral: Clinical Social Work   Post Acute Care Choice: IP Rehab                               Social Determinants of Health (SDOH) Interventions     Readmission Risk Interventions No flowsheet data found.

## 2019-03-02 NOTE — Progress Notes (Signed)
Patient arrived in a gown with her son at bedside. Patient was cleaned up and a new purewick was place. Dressings were changed. Policies and procedures were reviewed. Admission completed.

## 2019-03-02 NOTE — Progress Notes (Signed)
Progress Note    03/02/2019 7:05 AM 8 Days Post-Op  Subjective:  Awake and alert without complaints    Vitals:   03/01/19 1955 03/02/19 0500  BP: (!) 166/64 (!) 146/59  Pulse: 84 73  Resp: 20 18  Temp: 98.1 F (36.7 C) 98.1 F (36.7 C)  SpO2: 98% 96%    Physical Exam: Cardiac:  RRR Lungs:  CTA bil Incisions:  Right groin less erythematous, still with serous drainage.  Prox thigh incision erythematous.  Right residula limb with moderate edema. Staple line intact. Left groin without signs of infection Extremities:  Left foot warm; skin intact  Abdomen:  Soft. +BS  CBC    Component Value Date/Time   WBC 3.0 (L) 02/26/2019 0312   RBC 3.08 (L) 02/26/2019 0312   HGB 9.5 (L) 02/26/2019 0312   HCT 27.7 (L) 02/26/2019 0312   PLT 104 (L) 02/26/2019 0312   MCV 89.9 02/26/2019 0312   MCH 30.8 02/26/2019 0312   MCHC 34.3 02/26/2019 0312   RDW 14.7 02/26/2019 0312   LYMPHSABS 1.6 11/17/2015 1228   MONOABS 0.6 11/17/2015 1228   EOSABS 0.1 11/17/2015 1228   BASOSABS 0.0 11/17/2015 1228    BMET    Component Value Date/Time   NA 140 03/02/2019 0500   NA 137 11/04/2018 1132   K 3.8 03/02/2019 0500   CL 108 03/02/2019 0500   CO2 25 03/02/2019 0500   GLUCOSE 59 (L) 03/02/2019 0500   BUN 18 03/02/2019 0500   BUN 24 11/04/2018 1132   CREATININE 1.04 (H) 03/02/2019 0500   CALCIUM 8.6 (L) 03/02/2019 0500   GFRNONAA 51 (L) 03/02/2019 0500   GFRAA 59 (L) 03/02/2019 0500     Intake/Output Summary (Last 24 hours) at 03/02/2019 0705 Last data filed at 03/01/2019 1700 Gross per 24 hour  Intake 340 ml  Output 1300 ml  Net -960 ml    HOSPITAL MEDICATIONS Scheduled Meds: . aspirin EC  81 mg Oral Daily  . Chlorhexidine Gluconate Cloth  6 each Topical Daily  . cloNIDine  0.2 mg Oral QHS  . docusate sodium  100 mg Oral Daily  . heparin  5,000 Units Subcutaneous Q8H  . hydrALAZINE  25 mg Oral QHS  . insulin aspart  0-9 Units Subcutaneous TID AC & HS  . insulin glargine   15 Units Subcutaneous QHS  . levothyroxine  50 mcg Oral QAC breakfast  . losartan  100 mg Oral Daily  . NIFEdipine  30 mg Oral Daily  . pantoprazole  40 mg Oral Daily  . simvastatin  40 mg Oral q1800  . sodium chloride flush  10-40 mL Intracatheter Q12H  . sodium chloride flush  3 mL Intravenous Q12H  . sodium chloride flush  3 mL Intravenous Q12H  . torsemide  20 mg Oral Daily  . traZODone  100 mg Oral QHS   Continuous Infusions: . sodium chloride    . sodium chloride    . magnesium sulfate bolus IVPB    . vancomycin 1,000 mg (03/01/19 2124)   PRN Meds:.sodium chloride, sodium chloride, acetaminophen, alum & mag hydroxide-simeth, bisacodyl, guaiFENesin-dextromethorphan, HYDROmorphone (DILAUDID) injection, labetalol, magnesium sulfate bolus IVPB, metoprolol tartrate, morphine injection, ondansetron, oxyCODONE, phenol, polyethylene glycol, potassium chloride, pregabalin, senna-docusate, sodium chloride flush  Assessment:  78 y.o. female is s/p: left CFA pseudo repair, right fem - tp bypass with subsequent right aka due to ischemic foot.   Hypokalemia corrected with oral repletion.  PT/OT notes reviewed.   8 Days Post-Op  Plan: -Will dicontinue groin and thigh staples today.  Continue with efforts to place in rehab.  BUN/Cr stable on vanc. -DVT prophylaxis:  Sub q heparin  Risa Grill, PA-C [facility] [phone] 03/02/2019  7:05 AM

## 2019-03-02 NOTE — Progress Notes (Signed)
Staples removed today per MD order.  Pt was premedicated with morphine, nonetheless, procedure was extremely painful for Pt.  1' inch upper portion of right groin incision could benfit from steri-strips, however due to infection site is too wet at this time for application.  Pt is currently doing well and stable.  Will continue to monitor.

## 2019-03-02 NOTE — Progress Notes (Signed)
PMR Admission Coordinator Pre-Admission Assessment   Patient: Debra Hopkins is an 78 y.o., female MRN: [id number] DOB: 10-Apr-1940 Height: 5' 3"  (160 cm) Weight: 75.9 kg   Insurance Information HMO:     PPO: yes     PCP:      IPA:      80/20:      OTHER:  PRIMARY: UHC Medicare      Policy#: [id number]      Subscriber: Debra Hopkins CM Name: Debra Hopkins      Phone#: [phone]     Fax#: [phone] Pre-Cert#: [id number] [place] for CIR admission provided by Debra Hopkins at [place] with updates due on 12/24 (7 days from initial request, and can be sent on 12/23) to fax listed above      Employer: n/a Benefits:  Phone #: [phone]     Name: n/a Eff. Date: 03/11/2018     Deduct: $0     Out of Pocket Max: 3900 [phone] met, but will reset on 03/12/2019)      Life Max: n/a CIR: $345/day for days 1-5      SNF: n/a Outpatient:      Co-Pay: $40/visit Home Health: 100%      Co-Pay:  DME: 80%     Co-Pay: 20% Providers: preferred network  SECONDARY:       Policy#:       Subscriber:  CM Name:       Phone#:      Fax#:  Pre-Cert#:       Employer:  Benefits:  Phone #:      Name:  Eff. Date:      Deduct:       Out of Pocket Max:       Life Max:  CIR:       SNF:  Outpatient:      Co-Pay:  Home Health:       Co-Pay:  DME:      Co-Pay:    Medicaid Application Date:       Case Manager:  Disability Application Date:       Case Worker:    The "Data Collection Information Summary" for patients in Inpatient Rehabilitation Facilities with attached "Privacy Act [place] Records" was provided and verbally reviewed with: Patient and Family   Emergency Contact Information         Contact Information     Name Relation Home Work Debra Hopkins [phone]   [phone]    Debra Hopkins [phone]   [phone]         Current Medical History  Patient Admitting Diagnosis: R AKA   History of Present Illness: Debra Hopkins is a 78 year old right-handed female history of CAD,  diabetes mellitus, TIA, left CEA 2008, fibromyalgia, macular degeneration, peripheral vascular disease with multiple revascularization procedures and maintained on low-dose aspirin.  History taken from chart review and patient.  Presented on 02/15/2019 with chronic RLE ischemic changes and toe ulceration. Recent aortogram showed aortic and iliac segments heavily calcified.  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 [name] on 02/15/2019.  Noted postoperatively mottling of the right foot started on heparin drip.  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 Donnetta Hutching.  Hospital course further complicated by acute blood loss anemia patient was transfused 2 units with latest hemoglobin 9.5.  Subcutaneous heparin was initiated for DVT prophylaxis 02/23/2019 as well as remaining on low-dose aspirin.  Therapy evaluations were completed and Debra Hopkins was recommended for CIR.    Complete NIHSS TOTAL: 0   Patient's medical record from [facility] has been reviewed by the rehabilitation admission coordinator and physician.   Past Medical History      Past Medical History:  Diagnosis Date  . Arthritis    . CAD (coronary artery disease)    . Cancer ([place])    . Carotid artery occlusion    . Cataracts, bilateral    . DDD (degenerative disc disease)    . Diabetes mellitus      Type 2  . Diverticulitis    . Dyspnea    . Fibromyalgia    . GERD (gastroesophageal reflux disease)    . Heart murmur    . Hemorrhoids    . History of shingles    . Impaired hearing    . Macular degeneration    . Other and unspecified hyperlipidemia    . PVD (peripheral vascular disease) ([place])    . TIA (transient ischemic attack)    .  Unspecified essential hypertension    . Varicose veins        Family History   family history includes Coronary artery disease in her father and another family member; Diabetes in her father, sister, and Hopkins; Healthy in her brother; Heart disease in her father; Hypertension in her father, mother, sister, and Hopkins; Lung cancer in her brother; Sinusitis in her brother; Stroke in her father and mother.   Prior Rehab/Hospitalizations Has the patient had prior rehab or hospitalizations prior to admission? No   Has the patient had major surgery during 100 days prior to admission? Yes              Current Medications   Current Facility-Administered Medications:  .  0.9 %  sodium chloride infusion, 250 mL, Intravenous, PRN, Eveland, Matthew, PA-C .  0.9 %  sodium chloride infusion, 500 mL, Intravenous, Once PRN, Dagoberto Ligas, PA-C .  acetaminophen (TYLENOL) tablet 650 mg, 650 mg, Oral, Q4H PRN, Dagoberto Ligas, PA-C, 650 mg at 03/01/19 1006 .  alum & mag hydroxide-simeth (MAALOX/MYLANTA) 200-200-20 MG/5ML suspension 15-30 mL, 15-30 mL, Oral, Q2H PRN, Eveland, Matthew, PA-C .  aspirin EC tablet 81 mg, 81 mg, Oral, Daily, Dagoberto Ligas, PA-C, 81 mg at 03/01/19 1006 .  bisacodyl (DULCOLAX) suppository 10 mg, 10 mg, Rectal, Daily PRN, Dagoberto Ligas, PA-C, 10 mg at 02/17/19 2027 .  Chlorhexidine Gluconate Cloth 2 % PADS 6 each, 6 each, Topical, Daily, Dagoberto Ligas, PA-C, 6 each at 03/02/19 0450 .  cloNIDine (CATAPRES) tablet 0.2 mg, 0.2 mg, Oral, QHS, Eveland, Matthew, PA-C, 0.2 mg at 03/01/19 2119 .  docusate sodium (COLACE) capsule 100 mg, 100 mg, Oral, Daily, Dagoberto Ligas, PA-C, 100 mg at 03/01/19 1006 .  guaiFENesin-dextromethorphan (ROBITUSSIN DM) 100-10 MG/5ML syrup 15 mL, 15 mL, Oral, Q4H PRN, Dagoberto Ligas, PA-C .  heparin injection 5,000 Units, 5,000 Units, Subcutaneous, Q8H, Dagoberto Ligas, PA-C, 5,000 Units at 03/02/19 0450 .  hydrALAZINE (APRESOLINE) tablet 25 mg, 25  mg, Oral, QHS, Dagoberto Ligas, PA-C, 25 mg at 03/01/19 2119 .  HYDROmorphone (DILAUDID) injection 0.5-1 mg, 0.5-1 mg, Intravenous, Q1H PRN, Dagoberto Ligas, PA-C, 1 mg at 02/27/19 0350 .  insulin aspart (novoLOG) injection 0-9 Units, 0-9 Units,  Subcutaneous, TID AC & HS, Waynetta Sandy, MD, 1 Units at 02/28/19 2127 .  insulin glargine (LANTUS) injection 15 Units, 15 Units, Subcutaneous, QHS, Dagoberto Ligas, PA-C, 15 Units at 03/01/19 2119 .  labetalol (NORMODYNE) injection 5 mg, 5 mg, Intravenous, Q6H PRN, Dagoberto Ligas, PA-C, 5 mg at 02/24/19 1002 .  levothyroxine (SYNTHROID) tablet 50 mcg, 50 mcg, Oral, QAC breakfast, Dagoberto Ligas, PA-C, 50 mcg at 03/02/19 0449 .  losartan (COZAAR) tablet 100 mg, 100 mg, Oral, Daily, Dagoberto Ligas, PA-C, 100 mg at 03/01/19 3151 .  magnesium sulfate IVPB 2 g 50 mL, 2 g, Intravenous, Daily PRN, Dagoberto Ligas, PA-C .  metoprolol tartrate (LOPRESSOR) injection 2-5 mg, 2-5 mg, Intravenous, Q2H PRN, Dagoberto Ligas, PA-C .  morphine 2 MG/ML injection 2 mg, 2 mg, Intravenous, Q2H PRN, Dagoberto Ligas, PA-C, 2 mg at 03/01/19 2119 .  NIFEdipine (PROCARDIA-XL/NIFEDICAL-XL) 24 hr tablet 30 mg, 30 mg, Oral, Daily, Eveland, Matthew, PA-C, 30 mg at 03/01/19 1006 .  ondansetron (ZOFRAN) injection 4 mg, 4 mg, Intravenous, Q6H PRN, Dagoberto Ligas, PA-C .  oxyCODONE (Oxy IR/ROXICODONE) immediate release tablet 5-10 mg, 5-10 mg, Oral, Q4H PRN, Dagoberto Ligas, PA-C, 10 mg at 03/02/19 0449 .  pantoprazole (PROTONIX) EC tablet 40 mg, 40 mg, Oral, Daily, Dagoberto Ligas, PA-C, 40 mg at 03/01/19 1006 .  phenol (CHLORASEPTIC) mouth spray 1 spray, 1 spray, Mouth/Throat, PRN, Eveland, Matthew, PA-C .  polyethylene glycol (MIRALAX / GLYCOLAX) packet 17 g, 17 g, Oral, Daily PRN, Dagoberto Ligas, PA-C, 17 g at 03/01/19 1005 .  potassium chloride SA (KLOR-CON) CR tablet 20-40 mEq, 20-40 mEq, Oral, Daily PRN, Dagoberto Ligas, PA-C .  pregabalin (LYRICA)  capsule 25 mg, 25 mg, Oral, TID PRN, Dagoberto Ligas, PA-C, 25 mg at 03/01/19 2119 .  senna-docusate (Senokot-S) tablet 1 tablet, 1 tablet, Oral, QHS PRN, Dagoberto Ligas, PA-C .  simvastatin (ZOCOR) tablet 40 mg, 40 mg, Oral, q1800, Dagoberto Ligas, PA-C, 40 mg at 03/01/19 1756 .  sodium chloride flush (NS) 0.9 % injection 10-40 mL, 10-40 mL, Intracatheter, Q12H, Waynetta Sandy, MD, 10 mL at 02/27/19 2204 .  sodium chloride flush (NS) 0.9 % injection 10-40 mL, 10-40 mL, Intracatheter, PRN, Waynetta Sandy, MD .  sodium chloride flush (NS) 0.9 % injection 3 mL, 3 mL, Intravenous, Q12H, Eveland, Matthew, PA-C, 3 mL at 03/01/19 2126 .  sodium chloride flush (NS) 0.9 % injection 3 mL, 3 mL, Intravenous, Q12H, Eveland, Matthew, PA-C, 3 mL at 03/01/19 2127 .  torsemide (DEMADEX) tablet 20 mg, 20 mg, Oral, Daily, Eveland, Matthew, PA-C, 20 mg at 03/01/19 1006 .  traZODone (DESYREL) tablet 100 mg, 100 mg, Oral, QHS, Dagoberto Ligas, PA-C, 100 mg at 03/01/19 2119 .  vancomycin (VANCOCIN) IVPB 1000 mg/200 mL premix, 1,000 mg, Intravenous, Q24H, Waynetta Sandy, MD, Last Rate: 200 mL/hr at 03/01/19 2124, 1,000 mg at 03/01/19 2124   Patients Current Diet:     Diet Order                      Diet heart healthy/carb modified Room service appropriate? No; Fluid consistency: Thin  Diet effective now                   Precautions / Restrictions Precautions Precautions: Fall Precaution Comments: R AKA Restrictions Weight Bearing Restrictions: Yes RLE Weight Bearing: Non weight bearing    Has the patient had 2 or more falls or a fall with injury in the past year? Yes  Prior Activity Level Limited Community (1-2x/wk): still driving but not leaving the house more than a few times per week   Prior Functional Level Self Care: Did the patient need help bathing, dressing, using the toilet or eating? Independent   Indoor Mobility: Did the patient need assistance with  walking from room to room (with or without device)? Independent   Stairs: Did the patient need assistance with internal or external stairs (with or without device)? Independent   Functional Cognition: Did the patient need help planning regular tasks such as shopping or remembering to take medications? Independent   Home Assistive Devices / Equipment Home Assistive Devices/Equipment: None   Prior Device Use: Indicate devices/aids used by the patient prior to current illness, exacerbation or injury? None of the above   Current Functional Level Cognition   Overall Cognitive Status: Impaired/Different from baseline Difficult to assess due to: Impaired communication Current Attention Level: Sustained Orientation Level: Oriented to person, Disoriented to place, Disoriented to time, Disoriented to situation Safety/Judgement: Decreased awareness of safety General Comments: very flat affect, clear STM deficits    Extremity Assessment (includes Sensation/Coordination)   Upper Extremity Assessment: Generalized weakness  Lower Extremity Assessment: Defer to Debra Hopkins evaluation RLE Deficits / Details: s/p sx; wrapped in ace wrap and nearly purple; hurts to touch RLE: Unable to fully assess due to pain RLE Coordination: decreased gross motor, decreased fine motor LLE Deficits / Details: painful to touch and move.  grossly 3/5 strength.  Debra Hopkins able to w/bear for transfer and standing for peri-care. LLE: Unable to fully assess due to pain LLE Coordination: decreased fine motor, decreased gross motor     ADLs   Overall ADL's : Needs assistance/impaired Eating/Feeding: Minimal assistance, Sitting Grooming: Wash/dry face, Sitting, Set up Grooming Details (indicate cue type and reason): relies heavily on unilateral support from L UE while performing ADLs, min guard for safety Upper Body Bathing: Moderate assistance, Sitting Lower Body Bathing: Maximal assistance, Total assistance, +2 for physical assistance,  +2 for safety/equipment, Bed level Upper Body Dressing : Moderate assistance, Sitting, Bed level Lower Body Dressing: Maximal assistance, Sitting/lateral leans Lower Body Dressing Details (indicate cue type and reason): MAX A to don socks from bed level Toilet Transfer: Maximal assistance Toilet Transfer Details (indicate cue type and reason): MAX A with stedy for sit>stand Toileting- Clothing Manipulation and Hygiene: +2 for physical assistance, +2 for safety/equipment, Total assistance Functional mobility during ADLs: Maximal assistance, +2 for physical assistance, +2 for safety/equipment, Cueing for safety, Cueing for sequencing(to attempt standing) General ADL Comments: session focus on seated grooming tasks and functional mobility via stedy. Debra Hopkins required MAX A +2 for sit>stand     Mobility   Overal bed mobility: Needs Assistance Bed Mobility: Supine to Sit Rolling: Max assist Supine to sit: HOB elevated, Min guard, Min assist, +2 for safety/equipment Sit to supine: +2 for physical assistance, Total assist General bed mobility comments: Min guard-MinA, Mod-Max VC to scoot to EOB, patient seems fearful of moving R residual limb and required encouragement to do so. Occasional MinA to scoot to EOB. Significantly extended time/increased effort     Transfers   Overall transfer level: Needs assistance Equipment used: Ambulation equipment used Transfers: Sit to/from Stand Sit to Stand: Max assist, +2 physical assistance Squat pivot transfers: Total assist Anterior-Posterior transfers: Max assist, +2 physical assistance General transfer comment: MaxAx2 to come to standing position with hips extended enough to clear stedy pads; on second stand MaxAx2 to barely clear stedy seat  Ambulation / Gait / Stairs / Wheelchair Mobility   Ambulation/Gait Ambulation/Gait assistance: Herbalist (Feet): 3 Feet Assistive device: Rolling walker (2 wheeled) Gait Pattern/deviations:  Shuffle General Gait Details: short step to shuffling gait to trun from bed to recliner Gait velocity: reduced Gait velocity interpretation: <1.31 ft/sec, indicative of household ambulator     Posture / Balance Dynamic Sitting Balance Sitting balance - Comments: S for safety Balance Overall balance assessment: Needs assistance Sitting-balance support: Feet supported, Bilateral upper extremity supported Sitting balance-Leahy Scale: Fair Sitting balance - Comments: S for safety Standing balance support: Bilateral upper extremity supported, During functional activity Standing balance-Leahy Scale: Zero Standing balance comment: reliant on external support     Special needs/care consideration BiPAP/CPAP no CPM no Continuous Drip IV no Dialysis no        Days n/a Life Vest no Oxygen no Special Bed no Trach Size no Wound Vac (area) no      Location n/a Skin amputation, RLE, L groin incision                     Bowel mgmt:  Bladder mgmt:  Diabetic mgmt: yes Behavioral consideration  Chemo/radiation     Previous Home Environment (from acute therapy documentation) Living Arrangements: Alone Available Help at Discharge: Family, Other (Comment) Type of Home: [place]: No Additional Comments: Unable to reach Hopkins to get PLOF and enviromental questions answered   Discharge Living Setting Plans for Discharge Living Setting: Other (Comment) Type of Home at Discharge: Other (Comment)(town home) Discharge Home Layout: One level Discharge Home Access: Level entry Discharge Bathroom Shower/Tub: Walk-in shower Discharge Bathroom Toilet: Handicapped height Discharge Bathroom Accessibility: Yes How Accessible: Accessible via wheelchair Does the patient have any problems obtaining your medications?: No   Social/Family/Support Systems Anticipated Caregiver: Hopkins, Debra Hopkins Anticipated Caregiver's Contact Information: [phone] Caregiver Availability: 24/7 Discharge  Plan Discussed with Primary Caregiver: Yes Is Caregiver In Agreement with Plan?: Yes Does Caregiver/Family have Issues with Lodging/Transportation while Debra Hopkins is in Rehab?: No   Goals/Additional Needs Patient/Family Goal for Rehab: Debra Hopkins/OT supervision w/c level Expected length of stay: 18-21 days Dietary Needs: heart healthy/carb modified Debra Hopkins/Family Agrees to Admission and willing to participate: Yes Program Orientation Provided & Reviewed with Debra Hopkins/Caregiver Including Roles  & Responsibilities: Yes   Decrease burden of Care through IP rehab admission: n/a   Possible need for SNF placement upon discharge: Not anticipated.  Hopkins plans to ensure Debra Hopkins has "whatever she needs" at discharge from CIR.    Patient Condition: I have reviewed medical records from [facility], spoken with CM, and patient and Hopkins. I met with patient at the bedside and discussed via phone for inpatient rehabilitation assessment.  Patient will benefit from ongoing Debra Hopkins and OT, can actively participate in 3 hours of therapy a day 5 days of the week, and can make measurable gains during the admission.  Patient will also benefit from the coordinated team approach during an Inpatient Acute Rehabilitation admission.  The patient will receive intensive therapy as well as Rehabilitation physician, nursing, social worker, and care management interventions.  Due to safety, skin/wound care, disease management, medication administration, pain management and patient education the patient requires 24 hour a day rehabilitation nursing.  The patient is currently max +2 with mobility and basic ADLs.  Discharge setting and therapy post discharge at home is anticipated.  Patient has agreed to participate in the Acute Inpatient Rehabilitation Program and will admit today.  Preadmission Screen Completed By:  Michel Santee, Debra Hopkins, DPT 03/02/2019 10:28 AM ______________________________________________________________________   Discussed status with Dr.  Dagoberto Ligas on 03/02/19  at 10:28 AM  and received approval for admission today.   Admission Coordinator:  Michel Santee, Debra Hopkins, DPT time 10:28 AM Sudie Grumbling 03/02/19     Assessment/Plan: Diagnosis: 1. Does the need for close, 24 hr/day Medical supervision in concert with the patient's rehab needs make it unreasonable for this patient to be served in a less intensive setting? Yes 2. Co-Morbidities requiring supervision/potential complications: R AKA, PVD, DM, CAD, macular degeneration 3. Due to bowel management, safety, skin/wound care, disease management, medication administration, pain management and patient education, does the patient require 24 hr/day rehab nursing? Yes 4. Does the patient require coordinated care of a physician, rehab nurse, Debra Hopkins, OT, and SLP to address physical and functional deficits in the context of the above medical diagnosis(es)? Yes Addressing deficits in the following areas: balance, endurance, locomotion, strength, transferring, bathing, dressing, grooming, toileting, cognition and speech 5. Can the patient actively participate in an intensive therapy program of at least 3 hrs of therapy 5 days a week? Yes 6. The potential for patient to make measurable gains while on inpatient rehab is good 7. Anticipated functional outcomes upon discharge from inpatient rehab: modified independent and supervision Debra Hopkins, modified independent, supervision and n/a OT, n/a SLP 8. Estimated rehab length of stay to reach the above functional goals is: 18-21 days 9. Anticipated discharge destination: Home 10. Overall Rehab/Functional Prognosis: good     MD Signature:

## 2019-03-02 NOTE — Progress Notes (Signed)
Pharmacy Antibiotic Note  Debra Hopkins is a 78 y.o. female s/p R Fem-tp bypass, now with possible calf wound infection.  Pharmacy has been consulted for Vancomycin  Dosing.  Currently on day #3 of antibiotics. Scr stable ~1. Afebrile. Discussed with vascular and possible plan to discontinue antibiotics in near future. WBC on last check was 3 on 12/18.   Plan: Vancomycin 1000 mg IV q24h  Est AUC 525 Monitor renal fx, cx results, clinical pic, and levels if decide to continue   Height: 5\' 3"  (160 cm) Weight: 167 lb 5.3 oz (75.9 kg) IBW/kg (Calculated) : 52.4  Temp (24hrs), Avg:98.2 F (36.8 C), Min:98 F (36.7 C), Max:98.4 F (36.9 C)  Recent Labs  Lab 02/24/19 0321 02/25/19 0425 02/26/19 0312 03/01/19 0636 03/02/19 0500  WBC 2.4* 2.4* 3.0*  --   --   CREATININE 1.03*  --   --  0.96 1.04*    Estimated Creatinine Clearance: 42.8 mL/min (A) (by C-G formula based on SCr of 1.04 mg/dL (H)).    Allergies  Allergen Reactions  . Amoxicillin-Pot Clavulanate Hives and Other (See Comments)    Has patient had a PCN reaction causing immediate rash, facial/tongue/throat swelling, SOB or lightheadedness with hypotension: UNKNOWN Has patient had a PCN reaction causing severe rash involving mucus membranes or skin necrosis: UNKNOWN Has patient had a PCN reaction that required hospitalization No Has patient had a PCN reaction occurring within the last 10 years: No If all of the above answers are "NO", then may proceed with Cephalosporin use.   . Byetta 10 Mcg Pen [Exenatide] Other (See Comments)    Unknown  . Ceclor [Cefaclor] Other (See Comments)    Unknown  . Celexa [Citalopram Hydrobromide] Other (See Comments)    Sick  . Ciprofloxacin Nausea Only and Other (See Comments)    Dizziness  . Claritin [Loratadine] Other (See Comments)    Hair fell out  . Erythromycin Other (See Comments)    REACTION: Upset stomach  . Flexeril [Cyclobenzaprine] Other (See Comments)    Nervous  .  Fosamax [Alendronate] Other (See Comments)    Made pt sick  . Jardiance [Empagliflozin] Other (See Comments)    Vaginal infection, UTI  . Motrin [Ibuprofen] Nausea Only  . Naproxen Other (See Comments)    REACTION: severe nausea  . Norvasc [Amlodipine Besylate] Other (See Comments)    Unknown  . Prednisone Other (See Comments)    REACTION: itchy rash  . Prozac [Fluoxetine Hcl] Other (See Comments)    Sick  . Sulfamethoxazole-Trimethoprim Nausea And Vomiting and Other (See Comments)    REACTION: Upset stomach   Antonietta Jewel, PharmD, BCCCP Clinical Pharmacist  Phone: [phone]  Please check AMION for all [place] phone numbers After 10:00 PM, call [place] [phone] 03/02/2019 11:53 AM

## 2019-03-02 NOTE — Progress Notes (Signed)
Pt discharged today to [facility] via SWAT nurses.  Pt left with all of their personal belongings. Transfer report given to Vernie Murders RN.

## 2019-03-03 ENCOUNTER — Inpatient Hospital Stay (HOSPITAL_COMMUNITY): Payer: Medicare Other

## 2019-03-03 ENCOUNTER — Encounter (HOSPITAL_COMMUNITY): Payer: Medicare Other

## 2019-03-03 ENCOUNTER — Inpatient Hospital Stay (HOSPITAL_COMMUNITY): Payer: Medicare Other | Admitting: Physical Therapy

## 2019-03-03 ENCOUNTER — Ambulatory Visit: Payer: Medicare Other

## 2019-03-03 DIAGNOSIS — R609 Edema, unspecified: Secondary | ICD-10-CM

## 2019-03-03 DIAGNOSIS — L8990 Pressure ulcer of unspecified site, unspecified stage: Secondary | ICD-10-CM | POA: Insufficient documentation

## 2019-03-03 DIAGNOSIS — Z89611 Acquired absence of right leg above knee: Secondary | ICD-10-CM

## 2019-03-03 LAB — CBC WITH DIFFERENTIAL/PLATELET
Abs Immature Granulocytes: 0.01 10*3/uL (ref 0.00–0.07)
Basophils Absolute: 0 10*3/uL (ref 0.0–0.1)
Basophils Relative: 0 %
Eosinophils Absolute: 0 10*3/uL (ref 0.0–0.5)
Eosinophils Relative: 1 %
HCT: 25.4 % — ABNORMAL LOW (ref 36.0–46.0)
Hemoglobin: 8.2 g/dL — ABNORMAL LOW (ref 12.0–15.0)
Immature Granulocytes: 1 %
Lymphocytes Relative: 43 %
Lymphs Abs: 0.8 10*3/uL (ref 0.7–4.0)
MCH: 30.8 pg (ref 26.0–34.0)
MCHC: 32.3 g/dL (ref 30.0–36.0)
MCV: 95.5 fL (ref 80.0–100.0)
Monocytes Absolute: 0.2 10*3/uL (ref 0.1–1.0)
Monocytes Relative: 12 %
Neutro Abs: 0.8 10*3/uL — ABNORMAL LOW (ref 1.7–7.7)
Neutrophils Relative %: 43 %
Platelets: 69 10*3/uL — ABNORMAL LOW (ref 150–400)
RBC: 2.66 MIL/uL — ABNORMAL LOW (ref 3.87–5.11)
RDW: 14.1 % (ref 11.5–15.5)
WBC: 1.9 10*3/uL — ABNORMAL LOW (ref 4.0–10.5)
nRBC: 0 % (ref 0.0–0.2)

## 2019-03-03 LAB — GLUCOSE, CAPILLARY
Glucose-Capillary: 154 mg/dL — ABNORMAL HIGH (ref 70–99)
Glucose-Capillary: 202 mg/dL — ABNORMAL HIGH (ref 70–99)
Glucose-Capillary: 189 mg/dL — ABNORMAL HIGH (ref 70–99)
Glucose-Capillary: 148 mg/dL — ABNORMAL HIGH (ref 70–99)

## 2019-03-03 LAB — COMPREHENSIVE METABOLIC PANEL
ALT: 16 U/L (ref 0–44)
AST: 13 U/L — ABNORMAL LOW (ref 15–41)
Albumin: 2.1 g/dL — ABNORMAL LOW (ref 3.5–5.0)
Alkaline Phosphatase: 48 U/L (ref 38–126)
Anion gap: 7 (ref 5–15)
BUN: 17 mg/dL (ref 8–23)
CO2: 24 mmol/L (ref 22–32)
Calcium: 8.7 mg/dL — ABNORMAL LOW (ref 8.9–10.3)
Chloride: 109 mmol/L (ref 98–111)
Creatinine, Ser: 1.38 mg/dL — ABNORMAL HIGH (ref 0.44–1.00)
GFR calc Af Amer: 42 mL/min — ABNORMAL LOW (ref >60)
GFR calc non Af Amer: 36 mL/min — ABNORMAL LOW (ref >60)
Glucose, Bld: 160 mg/dL — ABNORMAL HIGH (ref 70–99)
Potassium: 3.9 mmol/L (ref 3.5–5.1)
Sodium: 140 mmol/L (ref 135–145)
Total Bilirubin: 0.6 mg/dL (ref 0.3–1.2)
Total Protein: 4.5 g/dL — ABNORMAL LOW (ref 6.5–8.1)

## 2019-03-03 LAB — PATHOLOGIST SMEAR REVIEW

## 2019-03-03 MED ORDER — CHLORHEXIDINE GLUCONATE CLOTH 2 % EX PADS
6.0000 | MEDICATED_PAD | Freq: Every day | CUTANEOUS | Status: DC
  Administered 2019-03-03 – 2019-03-12 (×6): 6 via TOPICAL

## 2019-03-03 MED ORDER — ENSURE ENLIVE PO LIQD
237.0000 mL | Freq: Two times a day (BID) | ORAL | Status: DC
  Administered 2019-03-03 – 2019-03-10 (×12): 237 mL via ORAL
  Filled 2019-03-03 (×2): qty 237

## 2019-03-03 MED ORDER — ADULT MULTIVITAMIN W/MINERALS CH
1.0000 | ORAL_TABLET | Freq: Every day | ORAL | Status: DC
  Administered 2019-03-03 – 2019-03-15 (×12): 1 via ORAL
  Filled 2019-03-03 (×13): qty 1

## 2019-03-03 MED ORDER — SODIUM CHLORIDE 0.9% FLUSH
10.0000 mL | INTRAVENOUS | Status: DC | PRN
  Administered 2019-03-04 – 2019-03-14 (×4): 10 mL
  Administered 2019-03-15: 04:00:00 20 mL

## 2019-03-03 MED ORDER — PREGABALIN 25 MG PO CAPS
25.0000 mg | ORAL_CAPSULE | Freq: Three times a day (TID) | ORAL | Status: DC
  Administered 2019-03-03 – 2019-03-15 (×38): 25 mg via ORAL
  Filled 2019-03-03 (×39): qty 1

## 2019-03-03 MED ORDER — LIDOCAINE 5 % EX PTCH
2.0000 | MEDICATED_PATCH | CUTANEOUS | Status: DC
  Administered 2019-03-03 – 2019-03-14 (×9): 2 via TRANSDERMAL
  Filled 2019-03-03 (×11): qty 2

## 2019-03-03 MED ORDER — METAXALONE 800 MG PO TABS
400.0000 mg | ORAL_TABLET | Freq: Three times a day (TID) | ORAL | Status: DC | PRN
  Administered 2019-03-13 – 2019-03-15 (×3): 400 mg via ORAL
  Filled 2019-03-03: qty 0.5
  Filled 2019-03-03 (×4): qty 1
  Filled 2019-03-03: qty 0.5

## 2019-03-03 MED ORDER — PRO-STAT SUGAR FREE PO LIQD
30.0000 mL | Freq: Two times a day (BID) | ORAL | Status: DC
  Administered 2019-03-03 – 2019-03-15 (×25): 30 mL via ORAL
  Filled 2019-03-03 (×26): qty 30

## 2019-03-03 NOTE — Progress Notes (Signed)
[facility] MEDICINE [facility] REHABILITATION PROGRESS NOTE   Subjective/Complaints: Pt reports pain "pretty bad"- doesn't want to eat since hurts so much- just had BM with CMAs on bedpan. Asking for pain meds.    ROS- pt denies CP, SOB, Abd pain, N/V/D/constipation, HA  Objective:   VAS [place] LOWER EXTREMITY VENOUS (DVT)  Result Date: 03/03/2019  Lower Venous Study Indications: Edema.  Limitations: Pain pain tolerance. Comparison Study: no prior Performing Technologist: Abram Sander RVS  Examination Guidelines: A complete evaluation includes B-mode imaging, spectral Doppler, color Doppler, and power Doppler as needed of all accessible portions of each vessel. Bilateral testing is considered an integral part of a complete examination. Limited examinations for reoccurring indications may be performed as noted.  +---------+---------------+---------+-----------+----------+-------------------+ LEFT     CompressibilityPhasicitySpontaneityPropertiesThrombus Aging      +---------+---------------+---------+-----------+----------+-------------------+ CFV      Full           Yes      Yes                                      +---------+---------------+---------+-----------+----------+-------------------+ SFJ      Full                                                             +---------+---------------+---------+-----------+----------+-------------------+ FV Prox  Full                                                             +---------+---------------+---------+-----------+----------+-------------------+ FV Mid   Full                                                             +---------+---------------+---------+-----------+----------+-------------------+ FV DistalFull                                                             +---------+---------------+---------+-----------+----------+-------------------+ PFV      Full                                                              +---------+---------------+---------+-----------+----------+-------------------+ POP                     Yes      Yes                  unable to tolerate  compression         +---------+---------------+---------+-----------+----------+-------------------+ PTV                                                   unable to tolerate                                                        compression         +---------+---------------+---------+-----------+----------+-------------------+ PERO                                                  Not visualized      +---------+---------------+---------+-----------+----------+-------------------+     Summary: Left: There is no evidence of deep vein thrombosis in the lower extremity. However, portions of this examination were limited- see technologist comments above. No cystic structure found in the popliteal fossa.  *See table(s) above for measurements and observations.    Preliminary    Recent Labs    03/02/19 0500 03/03/19 0455  WBC 2.5* 1.9*  HGB 9.3* 8.2*  HCT 29.2* 25.4*  PLT 86* 69*   Recent Labs    03/02/19 0500 03/03/19 0455  NA 140 140  K 3.8 3.9  CL 108 109  CO2 25 24  GLUCOSE 59* 160*  BUN 18 17  CREATININE 1.04* 1.38*  CALCIUM 8.6* 8.7*    Intake/Output Summary (Last 24 hours) at 03/03/2019 1559 Last data filed at 03/03/2019 1305 Gross per 24 hour  Intake 520 ml  Output 1300 ml  Net -780 ml     Physical Exam: Vital Signs Blood pressure (!) 144/66, pulse 76, temperature 98.1 F (36.7 C), temperature source Oral, resp. rate 18, weight 73.3 kg, SpO2 100 %.  General: pt awake alert, humming due to pain; lying supine in bed; with CMAs changing wound dressings, appears uncomfortable, NAD Head: Normocephalic and atraumatic.  Mouth/Throat: Oropharynx is clear and moist. No oropharyngeal exudate.  Dentures (+)  Eyes: Conjunctivae  and EOM are normal.  Neck: No tracheal deviation present. No thyromegaly present.  Cardiovascular:  RRR  Respiratory: Effort normal.   CTA B/L- no W/R/R  GI: She exhibits no distension.  Soft, NT, ND, (+)BS hypoactive  Genitourinary: Genitourinary Comments: purewick in place- medium amber urine in container  Musculoskeletal:  Cervical back: Normal range of motion and neck supple.  Comments: Right AKA with tenderness and edema UEs 5-5/ B/L- in Deltoid, biceps, triceps, WE, grip and finger abd LLE- HF 2+/5 , KE ~3/5- couldn't tolerate any touch to LLE; DF and PF 4+/5 R HF- couldn't lift off bed  Neurological: She is alert.  sensation intact to light touch on LLE  Skin:  R groin open and draining staples out- mild localized erythema; L groin looks better than R; R AKA in good shape- staples in tact; starting to pull in dog ears; no drainage seen Psychiatric: She has a normal mood and affect.       Assessment/Plan: 1. 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  Physiatrist is providing close team supervision and 24 hour management of active medical problems listed below.  Physiatrist and rehab team continue to assess barriers to discharge/monitor patient progress toward functional and medical goals  Care Tool:  Bathing    Body parts bathed by patient: Right arm, Left arm, Chest, Abdomen, Face, Right upper leg, Left upper leg   Body parts bathed by helper: Front perineal area, Buttocks, Left lower leg Body parts n/a: Right lower leg(AKA)   Bathing assist Assist Level: Moderate Assistance - Patient 50 - 74%     Upper Body Dressing/Undressing Upper body dressing   What is the patient wearing?: Pull over shirt    Upper body assist Assist Level: Contact Guard/Touching assist(EOB)    Lower Body Dressing/Undressing Lower body  dressing      What is the patient wearing?: Pants, Incontinence brief     Lower body assist Assist for lower body dressing: 2 Helpers     Toileting Toileting    Toileting assist Assist for toileting: 2 Helpers     Transfers Chair/bed transfer  Transfers assist  Chair/bed transfer activity did not occur: Safety/medical concerns(Pt with increased pain and LLE weakness limiting participation in therapy)  Chair/bed transfer assist level: Total Assistance - Patient < 25%(slideboard to the [facility])     Locomotion Ambulation   Ambulation assist   Ambulation activity did not occur: Safety/medical concerns(Pt with increased pain and LLE weakness limiting participation in therapy)          Walk 10 feet activity   Assist  Walk 10 feet activity did not occur: Safety/medical concerns(Pt with increased pain and LLE weakness limiting participation in therapy)        Walk 50 feet activity   Assist Walk 50 feet with 2 turns activity did not occur: Safety/medical concerns(Pt with increased pain and LLE weakness limiting participation in therapy)         Walk 150 feet activity   Assist Walk 150 feet activity did not occur: Safety/medical concerns(Pt with increased pain and LLE weakness limiting participation in therapy)         Walk 10 feet on uneven surface  activity   Assist Walk 10 feet on uneven surfaces activity did not occur: Safety/medical concerns(Pt with increased pain and LLE weakness limiting participation in therapy)         Wheelchair     Assist Will patient use wheelchair at discharge?: Yes Type of Wheelchair: Manual Wheelchair activity did not occur: Safety/medical concerns(Pt with increased pain and LLE weakness limiting participation in therapy)         Wheelchair 50 feet with 2 turns activity    Assist    Wheelchair 50 feet with 2 turns activity did not occur: Safety/medical concerns(Pt with increased pain and LLE weakness  limiting participation in therapy)       Wheelchair 150 feet activity     Assist  Wheelchair 150 feet activity did not occur: Safety/medical concerns(Pt with increased pain and LLE weakness limiting participation in therapy)       Blood pressure (!) 144/66, pulse 76, temperature 98.1 F (36.7 C), temperature source Oral, resp. rate 18, weight 73.3 kg, SpO2 100 %.  1. 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 Vancomycin resumed for wound coverage 03/01/2019  -patient may shower if R AKA wrapped  -ELOS/Goals: Min A/16-19 days.  Admit to CIR  2. Antithrombotics:  -DVT/anticoagulation: Subcutaneous heparin- suggest Doppler of LLE due to severe pain of LLE   12/23- Dopplers of LLE (-) -antiplatelet therapy: Aspirin 81 mg daily  3. Pain Management: Lyrica 25 mg 3 times daily as needed, oxycodone as needed  -likely will need more Lyrica- go back to home dose; also suggest Skelaxin for muscle relaxant since allergic to Flexeril and sedated easily; lidocaine patches 2 of them starting in AM on L calf   12/23- ordered lidoderm patches for L calf; added skelaxin for muscle pain prn; and made lyrica scheduled- probably need to increase in 2-3 days. 4. Mood: Trazodone 100 mg nightly  -antipsychotic agents: N/A  5. Neuropsych: This patient is capable of making decisions on her own behalf?- multiple word substitutions on exam- will need SLP.  6. Skin/Wound Care: Routine skin checks  7. Fluids/Electrolytes/Nutrition: Routine in and outs. CMP ordered.  8. Acute blood loss anemia. CBC ordered.  9. History of TIA. Continue aspirin  10. Hypertension. Clonidine 0.2 mg nightly, hydralazine 25 mg nightly, Cozaar 100 mg daily, Demadex 20 mg daily, Procardia XL 30 mg daily. Monitor with increased mobility.  12/23- BP slightly increased 144/90s- will monitor for trends  11. Hypothyroidism. Synthroid  12. Diabetes mellitus  peripheral neuropathy. Hemoglobin A1c 5.9. Lantus insulin 15 units nightly. Check blood sugars before meals and at bedtime. Monitor with increased mobility.  CBG (last 3)  Recent Labs    03/02/19 2122 03/03/19 0656 03/03/19 1157  GLUCAP 143* 154* 202*    12/23- labile slightly- but overall better 13. Hyperlipidemia. Zocor  14. Constipation. Laxative assistance  15. GERD. Protonix  16. Thrombocytopenia-   12/23- plts down to 69k- has been dropping- will ask team to address this along with leukopenia. 17. Leukopenia  12/23- WBC down to 1.9 18. Anemia-   12/23- down to 8.2 from 9.3 AND Cr increased- will ask team to call Hematology in AM 19. AKI-   12/23- Cr 1.38 up from 1.04- BUN not elevated- Likely due to IV Vanc 20. Infection?- Vanc restarted due to increased drainage from groin-WBC dropped even more- will need to recheck labs in AM- if worse, needs IM vs ID consult to see if needs IV Vanc.    LOS: 1 days A FACE TO FACE EVALUATION WAS PERFORMED  Shenicka Sunderlin 03/03/2019, 3:59 PM

## 2019-03-03 NOTE — Progress Notes (Signed)
Initial Nutrition Assessment  RD working remotely.  DOCUMENTATION CODES:   Not applicable, suspect malnutrition but unable to confirm at this time  INTERVENTION:   - Liberalize diet to Regular, verbal with readback order placed per MD  - Ensure Enlive po BID, each supplement provides 350 kcal and 20 grams of protein  - Pro-stat 30 ml po BID, each supplement provides 100 kcal and 15 grams of protein  - MVI with minerals daily  - Encourage adequate PO intake  NUTRITION DIAGNOSIS:   Increased nutrient needs related to wound healing as evidenced by estimated needs.  GOAL:   Patient will meet greater than or equal to 90% of their needs  MONITOR:   PO intake, Supplement acceptance, Labs, Weight trends, Skin  REASON FOR ASSESSMENT:   Malnutrition Screening Tool    ASSESSMENT:   78 year old female with PMH of CAD, DM, TIA, fibromyalgia, macular degeneration, PVD with multiple revascularization procedures. Presented on 02/15/19 with chronic RLE ischemic changes and toe ulceration. Recent aortogram showed aortic and iliac segments heavily calcified. 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 Pt admitted to CIR on 03/02/19.   Pt on Heart Healthy/Carb Modified diet. Discussed diet liberalization with MD who agreed. Diet liberalized to Regular.  Spoke with pt via phone call to room. Pt somewhat confused about timeframe for weight loss and poor appetite but able to answer most of RD's questions.  Pt reports that her appetite is "not too good" and that it's been this way "for a while." Pt states that her appetite was fine before she came  to the hospital but that it has worsened in the hospital. Pt unable to give exact timeframe for this.  Pt reports that she did not eat well at breakfast this morning because she wasn't hungry and because she was feeling stressed. Discussed with pt the importance of adequate kcal and protein intake in maintaining lean muscle mass and promoting wound healing. Pt expresses understanding.  Pt willing to take oral nutrition supplements during admission to aid in meeting kcal and protein needs. RD will also order daily MVI.  Reviewed weight history in chart. Pt with an 18.2 kg weight loss since 05/19/18. This is a 19.9% weight loss in 9 months which is significant for timeframe. Pt is at risk for malnutrition and may already meet criteria but RD unable to confirm without NFPE.  Per RN edema assessment, pt with non pitting edema to BLE and LUE. This may be masking additional weight loss.  Meal Completion: 25% x 1 meal  Medications reviewed and include: vitamin C 500 mg BID, colace, SSI, protonix, Ensure Max BID, torsemide, zinc sulfate 220 mg daily, IV abx  Labs reviewed: hemoglobin 8.2 CBG's: 100-154 x 24 hours  NUTRITION - FOCUSED PHYSICAL EXAM:  Unable to complete at this time. RD working remotely.  Diet Order:   Diet Order            Diet regular Room service appropriate? Yes; Fluid consistency: Thin  Diet effective now              EDUCATION NEEDS:   Education needs have been addressed  Skin:  Skin Assessment: Skin Integrity  Issues: DTI: right buttocks Stage I: buttocks Incisions: thigh, groin  Last BM:  03/02/19  Height:   Ht Readings from Last 1 Encounters:  02/15/19 5\' 3"  (1.6 m)    Weight:   Wt Readings from Last 1 Encounters:  03/02/19 73.3 kg    Ideal Body Weight:  48.1 kg (adjusted for AKA)  BMI:  Body mass index is 28.63 kg/m.  Estimated Nutritional Needs:   Kcal:  1600-1800  Protein:  80-95 grams  Fluid:  1.6-1.8 L    Gaynell Face,  MS, RD, LDN Inpatient Clinical Dietitian Pager: [phone] Weekend/After Hours: [phone]

## 2019-03-03 NOTE — H&P (Signed)
Physical Medicine and Rehabilitation Admission H&P  HPI: Debra Hopkins is a 78 year old right-handed female history of CAD, diabetes mellitus, TIA, left CEA 2008, fibromyalgia, macular degeneration, peripheral vascular disease with multiple revascularization procedures and maintained on low-dose aspirin. History taken from chart review and patient. Presented on 02/15/2019 with chronic RLE ischemic changes and toe ulceration. Recent aortogram showed aortic and iliac segments heavily calcified. 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 [name] on 02/15/2019. Noted postoperatively mottling of the right foot started on heparin drip. 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 Donnetta Hutching. Hospital course further complicated by acute blood loss anemia patient was transfused 2 units with latest hemoglobin 9.5. Subcutaneous heparin was initiated for DVT prophylaxis 02/23/2019 as well as remaining on low-dose aspirin. Vancomycin resumed 03/01/2023 wound coverage. Therapy evaluations completed and patient was admitted for a comprehensive rehab program.  Pt's staples in R thigh and B/L groins removed today- per nursing note, too moist with drainage- possible infection to place steristrips, although would benefit from them.  Pt also notes that she had episode of hallucinations/confusion a few days ago and so meds were reduced- was on lyrica at home- doesn't remember dose- was on it TID SCHEDULED, not prn, though, like current dosage.  LBM "this evening" - thought it was evening, not early afternoon and didn't clear with reminding her of time- pain rated also 7-8/10, even after pain meds IV morphine given, for staples removal,  since so painful- Also c/o SEVERE pain/muscle/sensitivity of L calf- since before was in hospital- barely able to let me touch the L calf at all. Does also say phantom pain better than was initially.  Admits to whistling and humming to help with pain.  Review of Systems  Constitutional: Negative for chills and fever.  HENT: Positive for hearing loss.  Eyes: Negative for blurred vision and double vision.  Respiratory: Negative for cough.  Dyspnea on exertion  Cardiovascular: Positive for leg swelling. Negative for chest pain and palpitations.  Gastrointestinal: Positive for constipation. Negative for heartburn, nausea and vomiting.  GERD  Genitourinary: Negative for dysuria and flank pain.  Musculoskeletal: Positive for joint pain and myalgias.  Skin: Negative for rash.  Neurological: Positive for focal weakness and weakness. Negative for sensory change.  Psychiatric/Behavioral:  Anxiety  All other systems reviewed and are negative.       Past Medical History:  Diagnosis Date  . Arthritis   . CAD (coronary artery disease)   . Cancer ([place])   . Carotid artery occlusion   . Cataracts, bilateral   . DDD (degenerative disc disease)   . Diabetes mellitus    Type 2  . Diverticulitis   . Dyspnea   . Fibromyalgia   . GERD (gastroesophageal reflux disease)   . Heart murmur   . Hemorrhoids   . History of shingles   . Impaired hearing   . Macular degeneration   . Other and unspecified hyperlipidemia   . PVD (peripheral vascular disease) ([place])   . TIA (transient ischemic attack)   . Unspecified essential hypertension   . Varicose veins         Past Surgical History:  Procedure Laterality Date  . ABDOMINAL AORTOGRAM W/LOWER EXTREMITY Bilateral 02/15/2019  Procedure: ABDOMINAL AORTOGRAM W/LOWER EXTREMITY; Surgeon: Waynetta Sandy, MD; Location: [facility]; Service: Cardiovascular; Laterality: Bilateral;  . AMPUTATION Right 02/22/2019   Procedure: RIGHT ABOVE KNEE  AMPUTATION; Surgeon: Rosetta Posner, MD; Location: [facility]; Service: Vascular; Laterality: Right;  . ANGIOPLASTY     left leg 2003  . APPENDECTOMY    . aspiration of a cyst in the right breast    . BALLOON DILATION N/A 10/01/2013   Procedure: BALLOON DILATION; Surgeon: Rogene Houston, MD; [facility]; Service: Endoscopy; Laterality: N/A;  . BREAST BIOPSY    . BREAST BIOPSY Left   . CARDIAC CATHETERIZATION    . CAROTID ENDARTERECTOMY  12-03-06   left CEA  . CATARACT EXTRACTION W/PHACO Right 10/14/2013   Procedure: CATARACT EXTRACTION PHACO AND INTRAOCULAR LENS PLACEMENT (IOC); Surgeon: Tonny Branch, MD; Location: [facility]; Service: Ophthalmology; Laterality: Right; CDE 7.20  . CATARACT EXTRACTION W/PHACO Left 11/04/2013   Procedure: CATARACT EXTRACTION PHACO AND INTRAOCULAR LENS PLACEMENT (IOC); Surgeon: Tonny Branch, MD; Location: [facility]; Service: Ophthalmology; Laterality: Left; CDE 8.57  . COLONOSCOPY N/A 09/19/2014   Procedure: COLONOSCOPY; Surgeon: Rogene Houston, MD; [facility]; Service: Endoscopy; Laterality: N/A; 7:30-8:30  . CYSTO WITH HYDRODISTENSION N/A 03/29/2016   Procedure: CYSTOSCOPY/HYDRODISTENSION; Surgeon: Irine Seal, MD; Location: [facility]; Service: Urology; Laterality: N/A;  . CYSTOSCOPY WITH INJECTION N/A 03/29/2016   Procedure: CYSTOSCOPY WITH PYRIDIUM AND MARCAINE INSTALLATION; Surgeon: Irine Seal, MD; [facility]: [facility]; Service: Urology; Laterality: N/A;  . ESOPHAGOGASTRODUODENOSCOPY N/A 10/01/2013   Procedure: ESOPHAGOGASTRODUODENOSCOPY (EGD); Surgeon: Rogene Houston, MD; [facility]; Service: Endoscopy; Laterality: N/A; 120  . ESOPHAGOGASTRODUODENOSCOPY N/A 10/24/2014   Procedure: ESOPHAGOGASTRODUODENOSCOPY (EGD); Surgeon: Rogene Houston, MD; [facility]; Service: Endoscopy; Laterality: N/A; 730  . FALSE ANEURYSM REPAIR Left 02/16/2019   Procedure: REPAIR LEFT FEMORAL PSEUDOANEURYSM; Surgeon: Waynetta Sandy, MD; Location: [facility]; Service: Vascular; Laterality: Left;  . FEMORAL-POPLITEAL BYPASS GRAFT Right 02/16/2019   Procedure: RIGHT BYPASS GRAFT FEMORAL-POPLITEAL ARTERY USING NON-REVERSED RIGHT GREATER SAPHENOUS VEIN; Surgeon: Waynetta Sandy, MD; Location: [facility]; Service: Vascular; Laterality: Right;  . GIVENS CAPSULE STUDY N/A 10/24/2014   Procedure: GIVENS CAPSULE STUDY; Surgeon: Rogene Houston, MD; [facility]; Service: Endoscopy; Laterality: N/A;  . JOINT REPLACEMENT  2011   Right total knee replacement  . [place]  . KNEE ARTHROSCOPY  2009  . LOWER EXTREMITY ANGIOGRAM Right 02/16/2019   Procedure: Lower Extremity Angiogram; Surgeon: Waynetta Sandy, MD; Location: [facility]; Service: Vascular; Laterality: Right;  . MALONEY DILATION N/A 10/01/2013   Procedure: Venia Minks DILATION; Surgeon: Rogene Houston, MD; [facility]; Service: Endoscopy; Laterality: N/A;  . PERIPHERAL VASCULAR INTERVENTION Right 02/15/2019   Procedure: PERIPHERAL VASCULAR INTERVENTION; Surgeon: Waynetta Sandy, MD; Location: [facility]; Service: Cardiovascular; Laterality: Right; external iliac, SFA, popliteal  . SAVORY DILATION N/A 10/01/2013   Procedure: SAVORY DILATION; Surgeon: Rogene Houston, MD; [facility]; Service: Endoscopy; Laterality: N/A;  . SPINE SURGERY  1997   C5-c6 fusion  . TOTAL ABDOMINAL HYSTERECTOMY  1973        Family History  Problem Relation Age of Onset  . Stroke Mother   . Hypertension Mother   . Stroke Father   . Hypertension Father   . Diabetes Father   . Coronary artery disease Father   . Heart disease Father   . Diabetes Sister   . Hypertension Sister   .  Lung cancer Brother   . Sinusitis Brother   . Healthy Brother   . Diabetes Son   . Hypertension Son   . Coronary artery disease Other    Social History: reports that she quit smoking about 33 years ago. Her smoking use included cigarettes. She has never used  smokeless tobacco. She reports that she does not drink alcohol or use drugs.  Allergies:       Allergies  Allergen Reactions  . Amoxicillin-Pot Clavulanate Hives and Other (See Comments)    Has patient had a PCN reaction causing immediate rash, facial/tongue/throat swelling, SOB or lightheadedness with hypotension: UNKNOWN  Has patient had a PCN reaction causing severe rash involving mucus membranes or skin necrosis: UNKNOWN  Has patient had a PCN reaction that required hospitalization No  Has patient had a PCN reaction occurring within the last 10 years: No  If all of the above answers are "NO", then may proceed with Cephalosporin use.   . Byetta 10 Mcg Pen [Exenatide] Other (See Comments)    Unknown  . Ceclor [Cefaclor] Other (See Comments)    Unknown  . Celexa [Citalopram Hydrobromide] Other (See Comments)    Sick  . Ciprofloxacin Nausea Only and Other (See Comments)    Dizziness  . Claritin [Loratadine] Other (See Comments)    Hair fell out  . Erythromycin Other (See Comments)    REACTION: Upset stomach  . Flexeril [Cyclobenzaprine] Other (See Comments)    Nervous  . Fosamax [Alendronate] Other (See Comments)    Made pt sick  . Jardiance [Empagliflozin] Other (See Comments)    Vaginal infection, UTI  . Motrin [Ibuprofen] Nausea Only  . Naproxen Other (See Comments)    REACTION: severe nausea  . Norvasc [Amlodipine Besylate] Other (See Comments)    Unknown  . Prednisone Other (See Comments)    REACTION: itchy rash  . Prozac [Fluoxetine Hcl] Other (See Comments)    Sick  . Sulfamethoxazole-Trimethoprim Nausea And Vomiting and Other (See Comments)    REACTION: Upset stomach         Medications Prior to Admission  Medication Sig Dispense Refill  . allopurinol (ZYLOPRIM) 100 MG tablet Take 100 mg by mouth daily as needed (Gout).     Marland Kitchen aspirin EC 81 MG tablet Take 81 mg by mouth daily.    . [place]-Glucosamine-Vit D (OSTEO BI-FLEX ONE PER DAY PO) Take 1 tablet by mouth  every other day.     . Calcium Citrate 250 MG TABS Take 200 mg of elemental calcium by mouth daily.    . Carboxymeth-Glycerin-Polysorb (REFRESH OPTIVE ADVANCED) 0.5-1-0.5 % SOLN Apply 1 drop to eye daily as needed (for dry eyes).     . Cholecalciferol (VITAMIN D3) 50 MCG (2000 UT) TABS Take 2,000 Units by mouth daily.     . clobetasol (TEMOVATE) 0.05 % external solution Apply 1 application topically daily as needed (eczema).    . cloNIDine (CATAPRES) 0.1 MG tablet Take 0.2 mg by mouth at bedtime.     Marland Kitchen esomeprazole (NEXIUM) 20 MG capsule Take 20 mg by mouth daily before breakfast.     . fluticasone (CUTIVATE) 0.05 % cream Apply 1 application topically daily as needed (Eczema in ears).    . hydrALAZINE (APRESOLINE) 25 MG tablet Take 25 mg by mouth at bedtime.     . Insulin Glargine (LANTUS SOLOSTAR) 100 UNIT/ML Solostar Pen INJECT 30 UNITS INTO THE SKIN EVERY DAY AT 10:00 PM (Patient taking differently: Inject 30 Units into the skin  at bedtime. AT 10:00 PM) 15 mL 2  . ketotifen (ALAWAY) 0.025 % ophthalmic solution Place 1 drop into both eyes daily as needed (for allergies).     Javier Docker Oil Omega-3 500 MG CAPS Take 1 tablet by mouth daily.     Marland Kitchen levothyroxine (SYNTHROID) 75 MCG tablet TAKE 1 TABLET BY MOUTH EVERY DAY BEFORE BREAKFAST (Patient taking differently: Take 50 mcg by mouth daily before breakfast. ) 30 tablet 5  . losartan (COZAAR) 100 MG tablet Take 100 mg by mouth daily.     . methocarbamol (ROBAXIN) 750 MG tablet Take 750 mg by mouth 2 (two) times daily as needed for muscle spasms.     . Misc Natural Products (TART CHERRY ADVANCED PO) Take 125 mLs by mouth daily. Juice    . Multiple Vitamins-Minerals (PRESERVISION AREDS 2 PO) Take 2 tablets by mouth daily.     Marland Kitchen NIFEdipine (PROCARDIA-XL/ADALAT-CC/NIFEDICAL-XL) 30 MG 24 hr tablet Take 30 mg by mouth daily.    Marland Kitchen oxyCODONE-acetaminophen (PERCOCET/ROXICET) 5-325 MG tablet Take 1 tablet by mouth every 6 (six) hours as needed for moderate pain.      . pregabalin (LYRICA) 25 MG capsule Take 25 mg by mouth 3 (three) times daily as needed (Pain).     . Propylene Glycol (SYSTANE BALANCE OP) Apply 1 drop to eye daily as needed (for dry eyes).     . simvastatin (ZOCOR) 10 MG tablet Take 10 mg by mouth every other day. In the evening    . SUPER B COMPLEX/C PO Take 1 tablet by mouth daily.     Marland Kitchen torsemide (DEMADEX) 20 MG tablet Take 20 mg by mouth daily.     . traZODone (DESYREL) 100 MG tablet Take 100 mg by mouth at bedtime.    Marland Kitchen ACCU-CHEK AVIVA PLUS test strip TEST FOUR TIMES DAILY AS DIRECTED 150 each 5  . Accu-Chek Softclix Lancets lancets USE ONE DAILY AS DIRECTED - THIS IS A 100 DAY SUPPLY 100 each 5  . GLOBAL EASE INJECT PEN NEEDLES 32G X 4 MM MISC USE ONCE DAILY WITH SOLOSTAR INSULIN 100 each 3   Drug Regimen Review  Drug regimen was reviewed and remains appropriate with no significant issues identified  Home:  Home Living  Family/patient expects to be discharged to:: Private residence  Living Arrangements: Alone  Available Help at Discharge: Family, Other (Comment)  Type of Home: House  Additional Comments: Unable to reach son to get PLOF and enviromental questions answered  Functional History:  Prior Function  Comments: Unable to determine without family present  Functional Status:  Mobility:  Bed Mobility  Overal bed mobility: Needs Assistance  Bed Mobility: Supine to Sit, Sit to Supine  Rolling: Max assist  Supine to sit: Max assist, +2 for physical assistance, HOB elevated  Sit to supine: +2 for physical assistance, Total assist  General bed mobility comments: increased time needed; cues for seuqencing and assistance required for all bed mobility using bed pad  Transfers  Overall transfer level: Needs assistance  Equipment used: Rolling walker (2 wheeled)  Transfers: Sit to/from Stand  Sit to Stand: Max assist, +2 physical assistance  Squat pivot transfers: Max assist, Total assist  General transfer comment: pt  required max encouragement to complete stand, used bed pad under hips and gait belt, pt able to achieve full standing however unable to tolerate >5 sec and had episode of urinary incontinence  Ambulation/Gait  Ambulation/Gait assistance: Min assist  Gait Distance (Feet): 3 Feet  Assistive  device: Rolling walker (2 wheeled)  Gait Pattern/deviations: Shuffle  General Gait Details: short step to shuffling gait to trun from bed to recliner  Gait velocity: reduced  Gait velocity interpretation: <1.31 ft/sec, indicative of household ambulator   ADL:  ADL  Overall ADL's : Needs assistance/impaired  Eating/Feeding: Minimal assistance, Sitting  Grooming: Oral care, Wash/dry face, Brushing hair, Min guard, Sitting  Grooming Details (indicate cue type and reason): relies heavily on unilateral support from L UE while performing ADLs, min guard for safety  Upper Body Bathing: Moderate assistance, Sitting  Lower Body Bathing: Maximal assistance, Total assistance, +2 for physical assistance, +2 for safety/equipment, Bed level  Upper Body Dressing : Moderate assistance, Sitting, Bed level  Lower Body Dressing: Maximal assistance, Total assistance, Bed level  Toilet Transfer: RW, Maximal assistance  Toilet Transfer Details (indicate cue type and reason): attempted with MAX A for sit>stand. once pt standing pt reporting, "I'm done for today"  Toileting- Clothing Manipulation and Hygiene: +2 for physical assistance, +2 for safety/equipment, Total assistance  Functional mobility during ADLs: Maximal assistance, +2 for physical assistance, +2 for safety/equipment, Cueing for safety, Cueing for sequencing(to attempt standing)  General ADL Comments: Pt limited by poor mobility, decreased ability to care for self and decreased strength. session focus on bed mobility and functional sit>stand with RW from EOB  Cognition:  Cognition  Overall Cognitive Status: No family/caregiver present to determine baseline  cognitive functioning  Orientation Level: Oriented X4  Cognition  Arousal/Alertness: Awake/alert  Behavior During Therapy: Anxious  Overall Cognitive Status: No family/caregiver present to determine baseline cognitive functioning  Area of Impairment: Orientation, Problem solving  Orientation Level: Disoriented to, Time  Current Attention Level: Sustained  Memory: Decreased recall of precautions, Decreased short-term memory  Problem Solving: Slow processing, Decreased initiation, Requires verbal cues, Requires tactile cues  General Comments: pt following commands consitently with increased time and verbal/tactile cues; pt confused at times and speaking off topic and asking who is laughing in the room althogh on one is laughing  Difficult to assess due to: Impaired communication  Physical Exam:  Blood pressure (!) 129/42, pulse 72, temperature 98.6 F (37 C), temperature source Oral, resp. rate 14, height 5\' 3"  (1.6 m), weight 75.9 kg, SpO2 94 %.  Physical Exam  Vitals reviewed.  Constitutional: She appears well-developed and well-nourished.  Obese, humming and whistling constantly "to deal with pain".  HENT:  Head: Normocephalic and atraumatic.  Mouth/Throat: Oropharynx is clear and moist. No oropharyngeal exudate.  Dentures?; face symetrical  Eyes: Conjunctivae and EOM are normal. Right eye exhibits no discharge. Left eye exhibits no discharge. No scleral icterus.  Neck: No tracheal deviation present. No thyromegaly present.  Cardiovascular:  RRR  Respiratory: Effort normal. No stridor. No respiratory distress.  CTA B/L- no W/R/R  GI: She exhibits no distension.  Soft, NT, ND, (+)BS hypoactive  Genitourinary: Genitourinary Comments: purewick in place- medium amber urine in container  Musculoskeletal:  Cervical back: Normal range of motion and neck supple.  Comments: Right AKA with tenderness and edema  UEs 5-5/ B/L- in Deltoid, biceps, triceps, WE, grip and finger abd  LLE- HF  2+/5 , KE ~3/5- couldn't tolerate any touch to LLE; DF and PF 4+/5 R HF- couldn't lift off bed  Neurological: She is alert.  Kept coming up with the wrong words- said stroke instead of she was strong, thought it was evening even though full sunlight outside; and did word substitutions 3 other times that were noticed. sensation intact  to light touch on LLE  Skin:  Right AKA site is dressed with retention sock, C/D/I.  Right groin incision with a small amount of clear drainage no odor left groin incision clean and dry-last seen 12/21  Psychiatric: She has a normal mood and affect. Her behavior is normal.   Lab Results Last 48 Hours        Results for orders placed or performed during the hospital encounter of 02/15/19 (from the past 48 hour(s))  Glucose, capillary Status: None   Collection Time: 02/24/19 8:02 AM  Result Value Ref Range   Glucose-Capillary 90 70 - 99 mg/dL  Glucose, capillary Status: Abnormal   Collection Time: 02/24/19 11:06 AM  Result Value Ref Range   Glucose-Capillary 113 (H) 70 - 99 mg/dL  Glucose, capillary Status: Abnormal   Collection Time: 02/24/19 4:35 PM  Result Value Ref Range   Glucose-Capillary 102 (H) 70 - 99 mg/dL  Hemoglobin and hematocrit, blood Status: Abnormal   Collection Time: 02/24/19 8:00 PM  Result Value Ref Range   Hemoglobin 9.3 (L) 12.0 - 15.0 g/dL    Comment: REPEATED TO VERIFY  POST TRANSFUSION SPECIMEN    HCT 27.1 (L) 36.0 - 46.0 %    Comment: Performed at [facility], [place] [address], [place], [place] [address]  Glucose, capillary Status: Abnormal   Collection Time: 02/24/19 9:49 PM  Result Value Ref Range   Glucose-Capillary 120 (H) 70 - 99 mg/dL  CBC Status: Abnormal   Collection Time: 02/25/19 4:25 AM  Result Value Ref Range   WBC 2.4 (L) 4.0 - 10.5 K/uL   RBC 2.88 (L) 3.87 - 5.11 MIL/uL   Hemoglobin 8.8 (L) 12.0 - 15.0 g/dL   HCT 26.0 (L) 36.0 - 46.0 %   MCV 90.3 80.0 - 100.0 fL   MCH 30.6 26.0 - 34.0 pg   MCHC 33.8  30.0 - 36.0 g/dL   RDW 15.0 11.5 - 15.5 %   Platelets 92 (L) 150 - 400 K/uL    Comment: Immature Platelet Fraction may be  clinically indicated, consider  ordering this additional test  [id number]  CONSISTENT WITH PREVIOUS RESULT    nRBC 0.0 0.0 - 0.2 %    Comment: Performed at [facility], [place] [address] [place], [place] [address]  Glucose, capillary Status: Abnormal   Collection Time: 02/25/19 6:14 AM  Result Value Ref Range   Glucose-Capillary 105 (H) 70 - 99 mg/dL  Glucose, capillary Status: Abnormal   Collection Time: 02/25/19 11:45 AM  Result Value Ref Range   Glucose-Capillary 115 (H) 70 - 99 mg/dL  Glucose, capillary Status: Abnormal   Collection Time: 02/25/19 4:12 PM  Result Value Ref Range   Glucose-Capillary 112 (H) 70 - 99 mg/dL  Glucose, capillary Status: Abnormal   Collection Time: 02/25/19 9:34 PM  Result Value Ref Range   Glucose-Capillary 104 (H) 70 - 99 mg/dL  CBC Status: Abnormal   Collection Time: 02/26/19 3:12 AM  Result Value Ref Range   WBC 3.0 (L) 4.0 - 10.5 K/uL   RBC 3.08 (L) 3.87 - 5.11 MIL/uL   Hemoglobin 9.5 (L) 12.0 - 15.0 g/dL   HCT 27.7 (L) 36.0 - 46.0 %   MCV 89.9 80.0 - 100.0 fL   MCH 30.8 26.0 - 34.0 pg   MCHC 34.3 30.0 - 36.0 g/dL   RDW 14.7 11.5 - 15.5 %   Platelets 104 (L) 150 - 400 K/uL    Comment: REPEATED TO VERIFY  Immature Platelet Fraction may be  clinically indicated, consider  ordering this additional test  [id number]  CONSISTENT WITH PREVIOUS RESULT    nRBC 0.0 0.0 - 0.2 %    Comment: Performed at [facility], [place] [address] [place], [place] [address]   Imaging Results (Last 48 hours)    Medical Problem List and Plan:  1. 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 Vancomycin resumed for wound coverage 03/01/2019  -patient may shower if R AKA wrapped  -ELOS/Goals: Min A/16-19 days.  Admit to CIR  2. Antithrombotics:    -DVT/anticoagulation: Subcutaneous heparin- suggest Doppler of LLE due to severe pain of LLE  -antiplatelet therapy: Aspirin 81 mg daily  3. Pain Management: Lyrica 25 mg 3 times daily as needed, oxycodone as needed  -likely will need more Lyrica- go back to home dose; also suggest Skelaxin for muscle relaxant since allergic to Flexeril and sedated easily; lidocaine patches 2 of them starting in AM on L calf  4. Mood: Trazodone 100 mg nightly  -antipsychotic agents: N/A  5. Neuropsych: This patient is capable of making decisions on her own behalf?- multiple word substitutions on exam- will need SLP.  6. Skin/Wound Care: Routine skin checks  7. Fluids/Electrolytes/Nutrition: Routine in and outs. CMP ordered.  8. Acute blood loss anemia. CBC ordered.  9. History of TIA. Continue aspirin  10. Hypertension. Clonidine 0.2 mg nightly, hydralazine 25 mg nightly, Cozaar 100 mg daily, Demadex 20 mg daily, Procardia XL 30 mg daily. Monitor with increased mobility.  11. Hypothyroidism. Synthroid  12. Diabetes mellitus peripheral neuropathy. Hemoglobin A1c 5.9. Lantus insulin 15 units nightly. Check blood sugars before meals and at bedtime. Monitor with increased mobility.  13. Hyperlipidemia. Zocor  14. Constipation. Laxative assistance  15. GERD. Protonix  Lavon Paganini Angiulli, PA-C  02/26/2019  I have personally performed a face to face diagnostic evaluation, including, but not limited to relevant history and physical exam findings, of this patient and developed relevant assessment and plan. Additionally, I have reviewed and concur with the physician assistant's documentation above.

## 2019-03-03 NOTE — Plan of Care (Signed)
  Problem: Consults Goal: RH LIMB LOSS PATIENT EDUCATION Description: Description: See Patient Education module for eduction specifics. Outcome: Progressing Goal: Skin Care Protocol Initiated - if Braden Score 18 or less Description: If consults are not indicated, leave blank or document N/A Outcome: Progressing Goal: Nutrition Consult-if indicated Outcome: Progressing Goal: Diabetes Guidelines if Diabetic/Glucose > 140 Description: If diabetic or lab glucose is > 140 mg/dl - Initiate Diabetes/Hyperglycemia Guidelines & Document Interventions  Outcome: Progressing   Problem: RH BOWEL ELIMINATION Goal: RH STG MANAGE BOWEL WITH ASSISTANCE Description: STG Manage Bowel with moderate Assistance. Outcome: Progressing   Problem: RH BLADDER ELIMINATION Goal: RH STG MANAGE BLADDER WITH ASSISTANCE Description: STG Manage Bladder With moderate Assistance Outcome: Progressing   Problem: RH SKIN INTEGRITY Goal: RH STG ABLE TO PERFORM INCISION/WOUND CARE W/ASSISTANCE Description: STG Able To Perform Incision/Wound Care With moderate Assistance. Outcome: Progressing   Problem: RH SAFETY Goal: RH STG ADHERE TO SAFETY PRECAUTIONS W/ASSISTANCE/DEVICE Description: STG Adhere to Safety Precautions With moderate Assistance/Device. Outcome: Progressing Goal: RH STG DECREASED RISK OF FALL WITH ASSISTANCE Description: STG Decreased Risk of Fall With moderate Assistance. Outcome: Progressing   Problem: RH PAIN MANAGEMENT Goal: RH STG PAIN MANAGED AT OR BELOW PT'S PAIN GOAL Description: Patient's pain level will be <3 by discharge. Outcome: Progressing   Problem: RH KNOWLEDGE DEFICIT LIMB LOSS Goal: RH STG INCREASE KNOWLEDGE OF SELF CARE AFTER LIMB LOSS Description: Patient will verbalize how to promote good circulation in stump and leg. Outcome: Progressing

## 2019-03-03 NOTE — Progress Notes (Signed)
Inpatient Rehabilitation  Patient information reviewed and entered into eRehab system by Laressa Bolinger M. Dorothia Passmore, M.A., CCC/SLP, PPS Coordinator.  Information including medical coding, functional ability and quality indicators will be reviewed and updated through discharge.    

## 2019-03-03 NOTE — Evaluation (Signed)
Occupational Therapy Assessment and Plan  Patient Details  Name: Debra Hopkins MRN: [id number] Date of Birth: 1941-01-27  OT Diagnosis: acute pain and muscle weakness (generalized) Rehab Potential: Rehab Potential (ACUTE ONLY): Fair ELOS: 3-4 weeks   Today's Date: 03/03/2019 OT Individual Time: 1030-1130 OT Individual Time Calculation (min): 60 min     Problem List:  Patient Active Problem List   Diagnosis Date Noted  . Pressure injury of skin 03/03/2019  . S/P AKA (above knee amputation), right ([place]) 03/02/2019  . Right below-knee amputee ([place]) 03/02/2019  . PAD (peripheral artery disease) ([place]) 02/15/2019  . Type 2 diabetes mellitus with stage 3 chronic kidney disease, without long-term current use of insulin ([place]) 10/16/2015  . Obesity 10/16/2015  . Anemia 02/21/2015  . GERD (gastroesophageal reflux disease) 02/08/2014  . Cough 02/08/2014  . Dysphagia, unspecified(787.20) 09/27/2013  . Fibromyalgia 09/27/2013  . Hypothyroidism 09/27/2013  . Aftercare following surgery of the circulatory system, [place] 02/25/2012  . Occlusion and stenosis of carotid artery without mention of cerebral infarction 02/19/2011  . CAD 07/31/2009  . Mixed hyperlipidemia 11/22/2008  . Essential hypertension, benign 11/22/2008  . PVD 11/22/2008  . DYSPNEA 11/22/2008    Past Medical History:  Past Medical History:  Diagnosis Date  . Arthritis   . CAD (coronary artery disease)   . Cancer ([place])   . Carotid artery occlusion   . Cataracts, bilateral   . DDD (degenerative disc disease)   . Diabetes mellitus    Type 2  . Diverticulitis   . Dyspnea   . Fibromyalgia   . GERD (gastroesophageal reflux disease)   . Heart murmur   . Hemorrhoids   . History of shingles   . Impaired hearing   . Macular degeneration   . Other and unspecified hyperlipidemia   . PVD (peripheral vascular disease) ([place])   . TIA (transient ischemic attack)   . Unspecified essential hypertension   . Varicose veins     Past Surgical History:  Past Surgical History:  Procedure Laterality Date  . ABDOMINAL AORTOGRAM W/LOWER EXTREMITY Bilateral 02/15/2019   Procedure: ABDOMINAL AORTOGRAM W/LOWER EXTREMITY;  Surgeon: Waynetta Sandy, MD;  Location: [facility];  Service: Cardiovascular;  Laterality: Bilateral;  . AMPUTATION Right 02/22/2019   Procedure: RIGHT ABOVE KNEE AMPUTATION;  Surgeon: Rosetta Posner, MD;  Location: [facility];  Service: Vascular;  Laterality: Right;  . ANGIOPLASTY     left leg 2003  . APPENDECTOMY    . aspiration of a cyst in the right breast    . BALLOON DILATION N/A 10/01/2013   Procedure: BALLOON DILATION;  Surgeon: Rogene Houston, MD;  [facility];  Service: Endoscopy;  Laterality: N/A;  . BREAST BIOPSY    . BREAST BIOPSY Left   . CARDIAC CATHETERIZATION    . CAROTID ENDARTERECTOMY  12-03-06   left CEA  . CATARACT EXTRACTION W/PHACO Right 10/14/2013   Procedure: CATARACT EXTRACTION PHACO AND INTRAOCULAR LENS PLACEMENT (IOC);  Surgeon: Tonny Branch, MD;  Location: [facility];  Service: Ophthalmology;  Laterality: Right;  CDE 7.20  . CATARACT EXTRACTION W/PHACO Left 11/04/2013   Procedure: CATARACT EXTRACTION PHACO AND INTRAOCULAR LENS PLACEMENT (IOC);  Surgeon: Tonny Branch, MD;  [facility]: [facility];  Service: Ophthalmology;  Laterality: Left;  CDE 8.57  . COLONOSCOPY N/A 09/19/2014   Procedure: COLONOSCOPY;  Surgeon: Rogene Houston, MD;  [facility];  Service: Endoscopy;  Laterality: N/A;  7:30-8:30  . CYSTO WITH HYDRODISTENSION N/A 03/29/2016  Procedure: CYSTOSCOPY/HYDRODISTENSION;  Surgeon: Irine Seal, MD;  [facility];  Service: Urology;  Laterality: N/A;  . CYSTOSCOPY WITH INJECTION N/A 03/29/2016   Procedure: CYSTOSCOPY WITH PYRIDIUM AND MARCAINE INSTALLATION;  Surgeon: Irine Seal, MD;  [facility];  Service: Urology;  Laterality: N/A;  . ESOPHAGOGASTRODUODENOSCOPY N/A 10/01/2013   Procedure: ESOPHAGOGASTRODUODENOSCOPY (EGD);  Surgeon: Rogene Houston, MD;  [facility];  Service: Endoscopy;  Laterality: N/A;  120  . ESOPHAGOGASTRODUODENOSCOPY N/A 10/24/2014   Procedure: ESOPHAGOGASTRODUODENOSCOPY (EGD);  Surgeon: Rogene Houston, MD;  [facility];  Service: Endoscopy;  Laterality: N/A;  730  . FALSE ANEURYSM REPAIR Left 02/16/2019   Procedure: REPAIR LEFT FEMORAL PSEUDOANEURYSM;  Surgeon: Waynetta Sandy, MD;  Location: [name];  Service: Vascular;  Laterality: Left;  . FEMORAL-POPLITEAL BYPASS GRAFT Right 02/16/2019   Procedure: RIGHT BYPASS GRAFT FEMORAL-POPLITEAL ARTERY USING NON-REVERSED RIGHT GREATER SAPHENOUS VEIN;  Surgeon: Waynetta Sandy, MD;  Location: [facility];  Service: Vascular;  Laterality: Right;  . GIVENS CAPSULE STUDY N/A 10/24/2014   Procedure: GIVENS CAPSULE STUDY;  Surgeon: Rogene Houston, MD;  [facility];  Service: Endoscopy;  Laterality: N/A;  . JOINT REPLACEMENT  2011   Right total knee replacement  . [place]  . KNEE ARTHROSCOPY  2009  . LOWER EXTREMITY ANGIOGRAM Right 02/16/2019   Procedure: Lower Extremity Angiogram;  Surgeon: Waynetta Sandy, MD;  Location: [facility];  Service: Vascular;  Laterality: Right;  . MALONEY DILATION N/A 10/01/2013   Procedure: Venia Minks DILATION;  Surgeon: Rogene Houston, MD;  [facility];  Service: Endoscopy;  Laterality: N/A;  . PERIPHERAL VASCULAR INTERVENTION Right 02/15/2019   Procedure: PERIPHERAL VASCULAR INTERVENTION;  Surgeon: Waynetta Sandy, MD;  Location: [facility];  Service: Cardiovascular;  Laterality: Right;  external iliac, SFA, popliteal  . SAVORY DILATION N/A 10/01/2013   Procedure: SAVORY DILATION;  Surgeon: Rogene Houston, MD;  [facility];  Service: Endoscopy;  Laterality: N/A;  . SPINE SURGERY  1997   C5-c6 fusion  . TOTAL ABDOMINAL HYSTERECTOMY  1973    Assessment & Plan Clinical Impression: Debra Hopkins is a 78 year old right-handed female  history of CAD, diabetes mellitus, TIA, left CEA 2008, fibromyalgia, macular degeneration, peripheral vascular disease with multiple revascularization procedures and maintained on low-dose aspirin.  History taken from chart review and patient.  Presented on 02/15/2019 with chronic RLE ischemic changes and toe ulceration. Recent aortogram showed aortic and iliac segments heavily calcified.  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 [name] on 02/15/2019.  Noted postoperatively mottling of the right foot started on heparin drip.  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 Donnetta Hutching.  Hospital course further complicated by acute blood loss anemia patient was transfused 2 units with latest hemoglobin 9.5.  Subcutaneous heparin was initiated for DVT prophylaxis 02/23/2019 as well as remaining on low-dose aspirin.  Vancomycin resumed 03/01/2023 wound coverage.  Therapy evaluations completed and patient was admitted for a comprehensive rehab program.   Pt's staples in R thigh and B/L groins removed today- per nursing note, too moist with drainage- possible infection to place steristrips, although would benefit from them.  Pt also notes that she had episode of hallucinations/confusion a few days ago and so meds were reduced- was on lyrica at home- doesn't remember dose- was on it TID SCHEDULED, not prn, though, like current dosage.  Patient transferred to CIR on 03/02/2019 .    Patient currently requires max with basic self-care skills secondary to muscle weakness, decreased cardiorespiratoy endurance, decreased problem solving and decreased sitting balance, decreased standing balance and decreased balance strategies.   Prior to hospitalization, patient could complete ADLs with modified independent .  Patient will benefit from skilled intervention to decrease level of assist with basic self-care skills and increase level of independence with iADL prior to discharge home with care partner.  Anticipate patient will require intermittent supervision and no further OT follow recommended.  OT - End of Session Activity Tolerance: Tolerates 10 - 20 min activity with multiple rests Endurance Deficit: Yes Endurance Deficit Description: generalized weakness OT Assessment Rehab Potential (ACUTE ONLY): Fair OT Barriers to Discharge: Decreased caregiver support OT Barriers to Discharge Comments: unclear if family will provide supervision in the home if needed OT Patient demonstrates impairments in the following area(s): Balance;Safety;Edema;Sensory;Skin Integrity;Endurance;Motor;Pain OT Basic ADL's Functional Problem(s): Grooming;Bathing;Dressing;Toileting OT Transfers Functional Problem(s): Toilet;Tub/Shower OT Additional Impairment(s): None OT Plan OT Intensity: Minimum of 1-2 x/day, 45 to 90 minutes OT Frequency: 5 out of 7 days OT Duration/Estimated Length of Stay: 3-4 weeks OT Treatment/Interventions: Balance/vestibular training;Discharge planning;Pain management;Self Care/advanced ADL retraining;Therapeutic Activities;UE/LE Coordination activities;Therapeutic Exercise;Skin care/wound managment;Patient/family education;Functional mobility training;Disease mangement/prevention;Community reintegration;DME/adaptive equipment instruction;Psychosocial support;UE/LE Strength taining/ROM;Splinting/orthotics;Wheelchair propulsion/positioning OT Self Feeding Anticipated Outcome(s): no goal set OT Basic Self-Care Anticipated Outcome(s): mod I OT Toileting Anticipated Outcome(s): mod I OT Bathroom Transfers Anticipated Outcome(s): mod I OT Recommendation Patient destination: Home Follow Up Recommendations: None Equipment  Recommended: To be determined   Skilled Therapeutic Intervention Skilled OT evaluation completed. Pt edu on ELOS, OT POC, generalized limb loss education, and unit fall policy. Pt completed bed mobility to EOB with mod A. Pt with decreased pain and increased awareness/alertness this session as compared to earlier PT session. Pt completed ADLs EOB as described below. Pt reported urgent BM needs and completed slideboard transfer to the [facility] with max +1 with heavy use of chuck pad to assist. Pt voided BM. Trialed use of stedy with +2 to +3 assist to stand with very limited success. Pt required total A for toileting and for donning pants. Pt left supine with all needs met. Discussed safety plan revisions with NT to assure safe transfers with staff. Bed alarm set.   OT Evaluation Precautions/Restrictions  Precautions Precautions: Fall Precaution Comments: R AKA NWB Restrictions Weight Bearing Restrictions: Yes RLE Weight Bearing: Non weight bearing General Chart Reviewed: Yes Family/Caregiver Present: Yes(son initially present) Pain Pain Assessment Pain Scale: 0-10 Pain Score: 7  Pain Type: Phantom pain;Surgical pain Pain Location: Leg Pain Orientation: Right Pain Descriptors / Indicators: Aching Pain Frequency: Constant Pain Onset: On-going Patients Stated Pain Goal: 3 Pain Intervention(s): Medication (See eMAR) Home Living/Prior Functioning Home Living Available Help at Discharge: Family Type of Home: Other(Comment)(condo) Home Access: Level entry Home Layout: One level Bathroom Shower/Tub: Multimedia programmer: Standard Bathroom Accessibility: Yes  Lives With: Alone IADL History Homemaking Responsibilities: Yes Meal Prep Responsibility: Secondary Laundry Responsibility: Secondary Cleaning Responsibility: Secondary Bill Paying/Finance Responsibility: Secondary Homemaking Comments: family can assist with IADLs Current License: No Prior Function Level of  Independence: Requires assistive device for independence, Independent with transfers, Independent with basic ADLs  Able to Take Stairs?: No  Driving: Yes Vocation: Retired ADL ADL Eating: Set up Where Assessed-Eating: Bed level Grooming: Setup Where Assessed-Grooming: Edge of bed Upper Body Bathing: Supervision/safety Where Assessed-Upper Body Bathing: Edge of bed Lower Body Bathing: Moderate assistance Where Assessed-Lower Body Bathing: Edge of bed Upper Body Dressing: Supervision/safety Where Assessed-Upper Body Dressing: Edge of bed Lower Body Dressing: Maximal assistance Where Assessed-Lower Body Dressing: Bed level Toileting: Dependent Where Assessed-Toileting: Bedside Commode Toilet Transfer: Maximal assistance Toilet Transfer Method: Theatre manager: Bedside commode Vision Baseline Vision/History: Wears glasses Wears Glasses: Reading only Patient Visual Report: No change from baseline Vision Assessment?: No apparent visual deficits Perception  Perception: Within Functional Limits Praxis Praxis: Intact Cognition Overall Cognitive Status: Within Functional Limits for tasks assessed Arousal/Alertness: Awake/alert Orientation Level: Person;Place;Situation Person: Oriented Place: Oriented Situation: Oriented Year: 2020 Month: December Day of Week: Incorrect(stated Monday the 21st) Memory: Impaired Memory Impairment: Decreased short term memory Decreased Short Term Memory: Verbal complex Immediate Memory Recall: Sock;Blue;Bed Memory Recall Sock: Not able to recall Memory Recall Blue: Without Cue Memory Recall Bed: With Cue Attention: Sustained Sustained Attention: Appears intact Awareness: Appears intact Problem Solving: Impaired Problem Solving Impairment: Functional complex Safety/Judgment: Appears intact Comments: Pt lethargic during session, became emotional with mobility due to pain Sensation Sensation Light Touch: Impaired  Detail Central sensation comments: Reports LLE numbness at times and expected numbness/altered sensation at the distal end of the RLE Proprioception: Appears Intact Additional Comments: Pt reported sensation equal bilaterally Coordination Gross Motor Movements are Fluid and Coordinated: No Fine Motor Movements are Fluid and Coordinated: Yes Coordination and Movement Description: Grossly uncoordinated to R AKA Finger Nose Finger Test: [facility] Heel Shin Test: unable on RLE due to BKA, significantly decreased on LLE due to pain and weakness Motor  Motor Motor: Abnormal postural alignment and control Motor - Skilled Clinical Observations: Grossly uncoordinated due to R AKA and bilateral LE weakness Mobility  Bed Mobility Bed Mobility: Rolling Right;Rolling Left;Supine to Sit;Sit to Supine Rolling Right: Moderate Assistance - Patient 50-74% Rolling Left: Moderate Assistance - Patient 50-74% Supine to Sit: Maximal Assistance - Patient - Patient 25-49% Sit to Supine: Maximal Assistance - Patient 25-49% Transfers Sit to Stand: 2 Helpers  Trunk/Postural Assessment  Cervical Assessment Cervical Assessment: Exceptions to WFL(forward head) Thoracic Assessment Thoracic Assessment: Within Functional Limits Lumbar Assessment Lumbar Assessment: Exceptions to WFL(posterior pelvic tilt) Postural Control Postural Control: Deficits on evaluation Righting Reactions: delayed  Balance Balance Balance Assessed: Yes Static Sitting Balance Static Sitting - Balance Support: Bilateral upper extremity supported Static Sitting - Level of Assistance: 5: Stand by assistance Dynamic Sitting Balance Dynamic Sitting - Balance Support: Bilateral upper extremity supported Dynamic Sitting - Level of Assistance: 5: Stand by assistance Static Standing Balance Static Standing - Balance Support: Bilateral upper extremity supported Static Standing - Level of Assistance: 1: +2 Total assist Extremity/Trunk  Assessment RUE Assessment RUE Assessment: Exceptions to [facility] General Strength Comments: generalized weakness LUE Assessment LUE Assessment: Exceptions to [facility] General Strength Comments: generalized weakness     Refer to Care Plan for Long Term Goals  Recommendations for other services: None    Discharge Criteria: Patient will be discharged from OT if patient refuses treatment 3 consecutive times without medical reason, if treatment goals not met, if there is a change in medical status, if patient makes no progress towards goals or if patient is discharged from hospital.  The above assessment, treatment plan, treatment alternatives and goals were discussed and mutually agreed upon: by patient  Curtis Sites  03/03/2019, 11:50 AM

## 2019-03-03 NOTE — Progress Notes (Signed)
Lower extremity venous has been completed.   Preliminary results in CV Proc.   Abram Sander 03/03/2019 9:45 AM

## 2019-03-03 NOTE — Progress Notes (Signed)
Progress Note    03/03/2019 8:14 AM 78 yo female initially admitted with acute on chronic RLE ischemia. S/p stenting of the right external iliac artery, popliteal and superficial femoral arteries on 12/07.  She subsequently developed right femoral artery pseudoaneurysm and mottling of her right foot. Heparin infusion initiated. On 12/08, she underwent repair of the pseudoaneurysm and right CFA to TP trunk bypass with nonreversed right greater saphenous vein graft.  Post-op, she developed serous drainage from the right groin incision and was started on Vancomycin.  She was admitted yesterday to acute rehab. Staples in both groins and thigh incision were removed yesterday.  We are asked to evaluate her right groin incision today.    Subjective:  No complaints currently (on bedpan, + hard stools).  She remains afebrile  Vitals:   03/02/19 1929 03/03/19 0530  BP: (!) 165/52 (!) 158/53  Pulse: 73 71  Resp: 14 18  Temp: 98 F (36.7 C) 98.1 F (36.7 C)  SpO2: 99% 99%    Physical Exam: Cardiac:  RRR Lungs:  nonlabored Incisions:  Skin edge separation noted of the right groin incision without exposure of deep layers. Some adherent fibrinous exudate noted. No purulence.  Thigh incision erythema noted, unchanged without drainage. Extremities:  Right AKA incision line intact, mild edema, flaps warm and well perfused   CBC    Component Value Date/Time   WBC 1.9 (L) 03/03/2019 0455   RBC 2.66 (L) 03/03/2019 0455   HGB 8.2 (L) 03/03/2019 0455   HCT 25.4 (L) 03/03/2019 0455   PLT 69 (L) 03/03/2019 0455   MCV 95.5 03/03/2019 0455   MCH 30.8 03/03/2019 0455   MCHC 32.3 03/03/2019 0455   RDW 14.1 03/03/2019 0455   LYMPHSABS 0.8 03/03/2019 0455   MONOABS 0.2 03/03/2019 0455   EOSABS 0.0 03/03/2019 0455   BASOSABS 0.0 03/03/2019 0455    BMET    Component Value Date/Time   NA 140 03/03/2019 0455   NA 137 11/04/2018 1132   K 3.9 03/03/2019 0455   CL 109 03/03/2019 0455   CO2 24  03/03/2019 0455   GLUCOSE 160 (H) 03/03/2019 0455   BUN 17 03/03/2019 0455   BUN 24 11/04/2018 1132   CREATININE 1.38 (H) 03/03/2019 0455   CALCIUM 8.7 (L) 03/03/2019 0455   GFRNONAA 36 (L) 03/03/2019 0455   GFRAA 42 (L) 03/03/2019 0455     Intake/Output Summary (Last 24 hours) at 03/03/2019 0814 Last data filed at 03/03/2019 0600 Gross per 24 hour  Intake 280 ml  Output 600 ml  Net -320 ml    HOSPITAL MEDICATIONS Scheduled Meds: . vitamin C  500 mg Oral BID  . aspirin EC  81 mg Oral Daily  . Chlorhexidine Gluconate Cloth  6 each Topical Daily  . cloNIDine  0.2 mg Oral QHS  . docusate sodium  100 mg Oral Daily  . heparin  5,000 Units Subcutaneous Q8H  . hydrALAZINE  25 mg Oral QHS  . insulin aspart  0-9 Units Subcutaneous TID AC & HS  . levothyroxine  50 mcg Oral QAC breakfast  . losartan  100 mg Oral Daily  . NIFEdipine  30 mg Oral Daily  . pantoprazole  40 mg Oral Daily  . pneumococcal 23 valent vaccine  0.5 mL Intramuscular Tomorrow-1000  . Ensure Max Protein  11 oz Oral BID  . simvastatin  40 mg Oral q1800  . torsemide  20 mg Oral Daily  . traZODone  100 mg Oral QHS  .  zinc sulfate  220 mg Oral Daily   Continuous Infusions: . vancomycin Stopped (03/03/19 0052)   PRN Meds:.acetaminophen, bisacodyl, guaiFENesin-dextromethorphan, oxyCODONE, polyethylene glycol, pregabalin, sodium chloride flush, sorbitol  Assessment:  78 y.o. female is s/p: poor healing right groin incision s/p right fem-TP bypass.  No evidence of cellulitis.  No current evidence of deep layer breakdown.    Plan: -Agree with Aquacel dressing, cover with foam border dressing.     Risa Grill, PA-C [facility] [phone] 03/03/2019  8:14 AM

## 2019-03-03 NOTE — Discharge Instructions (Signed)
Inpatient Rehab Discharge Instructions  Debra Hopkins Discharge date and time: No discharge date for patient encounter.   Activities/Precautions/ Functional Status: Activity: activity as tolerated Diet: diabetic diet Wound Care: keep wound clean and dry Functional status:  ___ No restrictions     ___ Walk up steps independently ___ 24/7 supervision/assistance   ___ Walk up steps with assistance ___ Intermittent supervision/assistance  ___ Bathe/dress independently ___ Walk with walker     _x__ Bathe/dress with assistance ___ Walk Independently    ___ Shower independently ___ Walk with assistance    ___ Shower with assistance ___ No alcohol     ___ Return to work/school ________  Special Instructions: No driving smoking or alcohol   My questions have been answered and I understand these instructions. I will adhere to these goals and the provided educational materials after my discharge from the hospital.  Patient/Caregiver Signature _______________________________ Date __________  Clinician Signature _______________________________________ Date __________  Please bring this form and your medication list with you to all your follow-up doctor's appointments.

## 2019-03-03 NOTE — Progress Notes (Signed)
Physical Therapy Session Note  Patient Details  Name: Debra Hopkins MRN: [id number] Date of Birth: [birth year]  Today's Date: 03/03/2019 PT Individual Time: 5053-9767 PT Individual Time Calculation (min): 69 min   Short Term Goals: Week 1:  PT Short Term Goal 1 (Week 1): pt will transfer supine<>sit with min A PT Short Term Goal 2 (Week 1): pt will transfer bed<>chair with LRAD mod A PT Short Term Goal 3 (Week 1): pt will perform car transfer with LRAD mod A  Skilled Therapeutic Interventions/Progress Updates: Pt presented in bed agreeable to therapy. Pt c/o pain 8/10 at residual limb. Pt stating urgency for BM. Performed supine to sit  modA with heavy use of bed features. Pt was able to scoot to EOB with modA and cues to use BUE to facilitate hips. Pt performed SB transfer to [facility] with modA x1 with max cues for hand placement. Pt with incontinent BM but was able to empty bowel/bladder while on BSC. Pt attempted STS x 2 with Stedy to perform peri-care. Pt was unable to come to full standing however was able to clear buttocks enough to initiate peri care. Pt was unable to clear enough to place seat pads in place. Pt then performed SB transfer back to bed mod/maxA x1. Pt then was able to scoot back and performed sit to supine maxA x1. PTA completed peri-care in bed with pt rolling L/R maxA x1. NT present to assist with changing bed and to facilitate peri-care. Pt then required dependent x 2 to boost to [facility]. Pt's bandage at R groin with significant drainage, nsg notified and aware. Pt positioned to comfort and left with cell bell within reach and needs met.      Therapy Documentation Precautions:  Precautions Precautions: Fall Precaution Comments: R AKA NWB Restrictions Weight Bearing Restrictions: Yes RLE Weight Bearing: Non weight bearing   Therapy/Group: Individual Therapy  Milferd Ansell  Anahid Eskelson, PTA  03/03/2019, 3:57 PM

## 2019-03-03 NOTE — Evaluation (Signed)
Physical Therapy Assessment and Plan  Patient Details  Name: Debra Hopkins MRN: [id number] Date of Birth: Jul 10, 1940  PT Diagnosis: Abnormal posture, Abnormality of gait, Difficulty walking and Muscle weakness Rehab Potential: Fair ELOS: 26-28   Today's Date: 03/03/2019 PT Individual Time: 0800-0900 PT Individual Time Calculation (min): 60 min    Problem List:  Patient Active Problem List   Diagnosis Date Noted  . S/P AKA (above knee amputation), right ([place]) 03/02/2019  . Right below-knee amputee ([place]) 03/02/2019  . PAD (peripheral artery disease) ([place]) 02/15/2019  . Type 2 diabetes mellitus with stage 3 chronic kidney disease, without long-term current use of insulin ([place]) 10/16/2015  . Obesity 10/16/2015  . Anemia 02/21/2015  . GERD (gastroesophageal reflux disease) 02/08/2014  . Cough 02/08/2014  . Dysphagia, unspecified(787.20) 09/27/2013  . Fibromyalgia 09/27/2013  . Hypothyroidism 09/27/2013  . Aftercare following surgery of the circulatory system, [place] 02/25/2012  . Occlusion and stenosis of carotid artery without mention of cerebral infarction 02/19/2011  . CAD 07/31/2009  . Mixed hyperlipidemia 11/22/2008  . Essential hypertension, benign 11/22/2008  . PVD 11/22/2008  . DYSPNEA 11/22/2008    Past Medical History:  Past Medical History:  Diagnosis Date  . Arthritis   . CAD (coronary artery disease)   . Cancer ([place])   . Carotid artery occlusion   . Cataracts, bilateral   . DDD (degenerative disc disease)   . Diabetes mellitus    Type 2  . Diverticulitis   . Dyspnea   . Fibromyalgia   . GERD (gastroesophageal reflux disease)   . Heart murmur   . Hemorrhoids   . History of shingles   . Impaired hearing   . Macular degeneration   . Other and unspecified hyperlipidemia   . PVD (peripheral vascular disease) ([place])   . TIA (transient ischemic attack)   . Unspecified essential hypertension   . Varicose veins    Past Surgical History:  Past Surgical History:   Procedure Laterality Date  . ABDOMINAL AORTOGRAM W/LOWER EXTREMITY Bilateral 02/15/2019   Procedure: ABDOMINAL AORTOGRAM W/LOWER EXTREMITY;  Surgeon: Waynetta Sandy, MD;  Location: [place] [facility];  Service: Cardiovascular;  Laterality: Bilateral;  . AMPUTATION Right 02/22/2019   Procedure: RIGHT ABOVE KNEE AMPUTATION;  Surgeon: Rosetta Posner, MD;  Location: [place];  Service: Vascular;  Laterality: Right;  . ANGIOPLASTY     left leg 2003  . APPENDECTOMY    . aspiration of a cyst in the right breast    . BALLOON DILATION N/A 10/01/2013   Procedure: BALLOON DILATION;  Surgeon: Rogene Houston, MD;  [facility];  Service: Endoscopy;  Laterality: N/A;  . BREAST BIOPSY    . BREAST BIOPSY Left   . CARDIAC CATHETERIZATION    . CAROTID ENDARTERECTOMY  12-03-06   left CEA  . CATARACT EXTRACTION W/PHACO Right 10/14/2013   Procedure: CATARACT EXTRACTION PHACO AND INTRAOCULAR LENS PLACEMENT (IOC);  Surgeon: Tonny Branch, MD;  Location: [facility];  Service: Ophthalmology;  Laterality: Right;  CDE 7.20  . CATARACT EXTRACTION W/PHACO Left 11/04/2013   Procedure: CATARACT EXTRACTION PHACO AND INTRAOCULAR LENS PLACEMENT (IOC);  Surgeon: Tonny Branch, MD;  Location: [facility];  Service: Ophthalmology;  Laterality: Left;  CDE 8.57  . COLONOSCOPY N/A 09/19/2014   Procedure: COLONOSCOPY;  Surgeon: Rogene Houston, MD;  [facility];  Service: Endoscopy;  Laterality: N/A;  7:30-8:30  . CYSTO WITH HYDRODISTENSION N/A 03/29/2016   Procedure: CYSTOSCOPY/HYDRODISTENSION;  Surgeon: Irine Seal, MD;  Location[facility];  Service: Urology;  Laterality: N/A;  . CYSTOSCOPY WITH INJECTION N/A 03/29/2016   Procedure: CYSTOSCOPY WITH PYRIDIUM AND MARCAINE INSTALLATION;  Surgeon: Irine Seal, MD;  Location[facility];  Service: Urology;  Laterality: N/A;  . ESOPHAGOGASTRODUODENOSCOPY N/A 10/01/2013   Procedure: ESOPHAGOGASTRODUODENOSCOPY (EGD);  Surgeon: Rogene Houston, MD;  [facility];  Service:  Endoscopy;  Laterality: N/A;  120  . ESOPHAGOGASTRODUODENOSCOPY N/A 10/24/2014   Procedure: ESOPHAGOGASTRODUODENOSCOPY (EGD);  Surgeon: Rogene Houston, MD;  [facility];  Service: Endoscopy;  Laterality: N/A;  730  . FALSE ANEURYSM REPAIR Left 02/16/2019   Procedure: REPAIR LEFT FEMORAL PSEUDOANEURYSM;  Surgeon: Waynetta Sandy, MD;  Location: [place];  Service: Vascular;  Laterality: Left;  . FEMORAL-POPLITEAL BYPASS GRAFT Right 02/16/2019   Procedure: RIGHT BYPASS GRAFT FEMORAL-POPLITEAL ARTERY USING NON-REVERSED RIGHT GREATER SAPHENOUS VEIN;  Surgeon: Waynetta Sandy, MD;  Location: [place];  Service: Vascular;  Laterality: Right;  . GIVENS CAPSULE STUDY N/A 10/24/2014   Procedure: GIVENS CAPSULE STUDY;  Surgeon: Rogene Houston, MD;  [facility];  Service: Endoscopy;  Laterality: N/A;  . JOINT REPLACEMENT  2011   Right total knee replacement  . [place]  . KNEE ARTHROSCOPY  2009  . LOWER EXTREMITY ANGIOGRAM Right 02/16/2019   Procedure: Lower Extremity Angiogram;  Surgeon: Waynetta Sandy, MD;  Location: [place];  Service: Vascular;  Laterality: Right;  . MALONEY DILATION N/A 10/01/2013   Procedure: Venia Minks DILATION;  Surgeon: Rogene Houston, MD;  [facility];  Service: Endoscopy;  Laterality: N/A;  . PERIPHERAL VASCULAR INTERVENTION Right 02/15/2019   Procedure: PERIPHERAL VASCULAR INTERVENTION;  Surgeon: Waynetta Sandy, MD;  Location: [place] [facility];  Service: Cardiovascular;  Laterality: Right;  external iliac, SFA, popliteal  . SAVORY DILATION N/A 10/01/2013   Procedure: SAVORY DILATION;  Surgeon: Rogene Houston, MD;  [facility];  Service: Endoscopy;  Laterality: N/A;  . SPINE SURGERY  1997   C5-c6 fusion  . TOTAL ABDOMINAL HYSTERECTOMY  1973    Assessment & Plan Clinical Impression: Patient is a 78 y.o. year old female with history of CAD, diabetes mellitus, TIA,left CEA  2008,fibromyalgia,macular degeneration,peripheral vascular diseasewith multiple revascularization procedures and maintained on low-dose aspirin. History taken from chart review and patient. Presented on 02/15/2019 with chronic RLE ischemic changes and toe ulceration.Recent aortogram showed aortic and iliac segments heavily calcified. 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 [name] on 02/15/2019.Noted postoperatively mottling of the right foot started on heparin drip. Underwent right common femoral enterectomy with open repair of left common femoral pseudoaneurysm on 02/16/2019.Hospital coursecomplicated bypaindue toprofound ischemic changes of her foot which was felt to be nonviable and underwent right AKAon 02/22/2019 byDr. Donnetta Hutching.Hospital coursefurther complicated by acuteblood loss anemia patient was transfused 2 unitswith latest hemoglobin 9.5. Subcutaneous heparin was initiated for DVT prophylaxis 02/23/2019 as well as remaining on low-dose aspirin.  Therapy evaluations were completed and pt was recommended for CIR.   Patient currently requires max with mobility secondary to muscle weakness and decreased sitting balance, decreased standing balance, decreased postural control, decreased balance strategies and difficulty maintaining precautions.  Prior to hospitalization, patient was modified independent  with mobility and lived with Alone in a (condo) home.  Home access is  Level entry.  Patient will benefit from skilled PT intervention to maximize safe functional mobility, minimize  fall risk and decrease caregiver burden for planned discharge home with intermittent assist.  Anticipate patient will benefit from follow up [facility] at discharge.  PT - End of Session Activity Tolerance: Tolerates 30+ min activity with multiple rests Endurance Deficit: Yes Endurance Deficit Description: generalized weakness PT  Assessment Rehab Potential (ACUTE/IP ONLY): Fair PT Barriers to Discharge: Decreased caregiver support;Medical stability;Wound Care;Weight bearing restrictions PT Barriers to Discharge Comments: Extremly limited by pain PT Patient demonstrates impairments in the following area(s): Balance;Endurance;Motor;Pain;Safety PT Transfers Functional Problem(s): Bed Mobility;Bed to Chair;Car;Furniture PT Locomotion Functional Problem(s): Ambulation;Wheelchair Mobility;Stairs PT Plan PT Frequency: 5 out of 7 days PT Duration Estimated Length of Stay: 26-28 PT Treatment/Interventions: Ambulation/gait training;Discharge planning;Functional mobility training;Psychosocial support;Therapeutic Activities;Balance/vestibular training;Disease management/prevention;Neuromuscular re-education;Skin care/wound management;Therapeutic Exercise;Wheelchair propulsion/positioning;DME/adaptive equipment instruction;Pain management;Splinting/orthotics;UE/LE Strength taining/ROM;Community reintegration;Patient/family education;Stair training;UE/LE Coordination activities PT Transfers Anticipated Outcome(s): Mod I with LRAD PT Locomotion Anticipated Outcome(s): Supervision with LRAD PT Recommendation Follow Up Recommendations: Home health PT Patient destination: Home Equipment Recommended: To be determined Equipment Details: has rollator at home  Skilled Therapeutic Intervention Evaluation completed (see details above and below) with education on PT POC and goals and individual treatment initiated with focus on functional mobility/transfers, standing tolerance, LE strength, balance, amputee education, and improved tolerance to activity. Received pt supine in bed, pt lethargic and with increased difficulty keeping eyes open. Pt educated on physical therapy evaluation and agreeable. Pt reported 9/10 pain along R LE, RN notified and present to administer pain medication. Session limited due to RN dressing R residual limb, removing  purewick, and patient with intense pain. Pt rolled to L and R with mod A, use of bedrails and verbal cues for hand placement on bedrails. Pt transferred L sidelying<>sitting EOB with max A and HOB elevated. Pt with poor trunk control and awareness requiring max verbal cues for hand placement on bed. Pt able to sit EOB with bilateral UE support CGA. Pt required mod A to scoot hips forward for LLE to rest on floor. Pt stated urge to have BM. Pt attempted sit<>stand with RW +2 assist but was unable to completely stand upright due to LLE weakness and R residual limb pain. Pt declined attempting sit<>stand again due to increased pain. Pt transferred squat<>pivot +2 assist from bed<>bedside commode. Pt began crying in pain stating "I can't do this, I can't do this". Therapist and NT provided emotional support and encouragement to patient. Pt unable to have BM on bedside commode, but did have small smear. Pt required total assist for hygiene management. Pt transferred bedside commode<>bed with slideboard +2 assist for scooting. Again, pt began crying due to increased pain. Pt transferred sitting EOB<>supine max A and requires total assist for repositioning in bed. Concluded session with pt supine in bed, needs within reach, bed alarm on, and safety plan updated. RN aware of pt current status.   PT Evaluation Precautions/Restrictions Precautions Precautions: Fall Precaution Comments: R AKA NWB Restrictions Weight Bearing Restrictions: Yes RLE Weight Bearing: Non weight bearing Home Living/Prior Functioning Home Living Available Help at Discharge: Family Type of Home: Other(Comment)(condo) Home Access: Level entry Home Layout: One level Bathroom Shower/Tub: Multimedia programmer: Standard Bathroom Accessibility: Yes  Lives With: Alone Prior Function Level of Independence: Requires assistive device for independence;Independent with transfers;Independent with basic ADLs  Able to Take Stairs?:  No Driving: Yes Vocation: Retired Hotel manager Status: Within Functional Limits for tasks assessed Arousal/Alertness: Lethargic Orientation Level: Oriented X4 Awareness: Appears intact Problem Solving: Impaired Safety/Judgment: Appears intact Comments: Pt lethargic  during session, became emotional with mobility due to pain Sensation Sensation Light Touch: Appears Intact  Proprioception: Appears Intact Additional Comments: Pt reported sensation equal bilaterally Coordination Gross Motor Movements are Fluid and Coordinated: No Fine Motor Movements are Fluid and Coordinated: Yes Coordination and Movement Description: Grossly uncoordinated to R AKA Finger Nose Finger Test: [facility] Heel Shin Test: unable on RLE due to BKA, significantly decreased on LLE due to pain and weakness Motor  Motor Motor: Abnormal postural alignment and control Motor - Skilled Clinical Observations: Grossly uncoordinated due to R AKA and bilateral LE weakness  Mobility Bed Mobility Bed Mobility: Rolling Right;Rolling Left;Supine to Sit;Sit to Supine Rolling Right: Moderate Assistance - Patient 50-74% Rolling Left: Moderate Assistance - Patient 50-74% Supine to Sit: Maximal Assistance - Patient - Patient 25-49% Sit to Supine: Maximal Assistance - Patient 25-49% Transfers Transfers: Sit to Stand;Lateral/Scoot Transfers Sit to Stand: 2 Helpers Squat Pivot Transfers: 2 Helpers Lateral/Scoot Transfers: Total Assistance - Patient < 25% Transfer (Assistive device): (slideboard and stedy) Trunk/Postural Assessment  Cervical Assessment Cervical Assessment: Exceptions to WFL(forward head) Thoracic Assessment Thoracic Assessment: Within Functional Limits Lumbar Assessment Lumbar Assessment: Exceptions to WFL(posterior pelvic tilt) Postural Control Postural Control: Deficits on evaluation Balance Balance Balance Assessed: Yes Static Sitting Balance Static Sitting - Balance Support: Bilateral  upper extremity supported Static Sitting - Level of Assistance: 5: Stand by assistance Dynamic Sitting Balance Dynamic Sitting - Balance Support: Bilateral upper extremity supported Dynamic Sitting - Level of Assistance: 5: Stand by assistance Static Standing Balance Static Standing - Balance Support: Bilateral upper extremity supported Static Standing - Level of Assistance: 1: +2 Total assist Extremity Assessment  RLE Assessment RLE Assessment: Exceptions to [facility] General Strength Comments: Limited due to pain grossly 3+/5 LLE Assessment LLE Assessment: Exceptions to [facility] General Strength Comments: Limited due to pain. Hip flexion 3/5, PF 3/5, DF 3+/5    Refer to Care Plan for Long Term Goals  Recommendations for other services: None   Discharge Criteria: Patient will be discharged from PT if patient refuses treatment 3 consecutive times without medical reason, if treatment goals not met, if there is a change in medical status, if patient makes no progress towards goals or if patient is discharged from hospital.  The above assessment, treatment plan, treatment alternatives and goals were discussed and mutually agreed upon: by patient  Alfonse Alpers PT, DPT  03/03/2019, 7:29 AM

## 2019-03-04 ENCOUNTER — Inpatient Hospital Stay (HOSPITAL_COMMUNITY): Payer: Medicare Other | Admitting: *Deleted

## 2019-03-04 ENCOUNTER — Inpatient Hospital Stay (HOSPITAL_COMMUNITY): Payer: Medicare Other

## 2019-03-04 ENCOUNTER — Inpatient Hospital Stay (HOSPITAL_COMMUNITY): Payer: Medicare Other | Admitting: Speech Pathology

## 2019-03-04 LAB — COMPREHENSIVE METABOLIC PANEL
ALT: 14 U/L (ref 0–44)
AST: 10 U/L — ABNORMAL LOW (ref 15–41)
Albumin: 2.2 g/dL — ABNORMAL LOW (ref 3.5–5.0)
Alkaline Phosphatase: 48 U/L (ref 38–126)
Anion gap: 10 (ref 5–15)
BUN: 27 mg/dL — ABNORMAL HIGH (ref 8–23)
CO2: 25 mmol/L (ref 22–32)
Calcium: 8.9 mg/dL (ref 8.9–10.3)
Chloride: 106 mmol/L (ref 98–111)
Creatinine, Ser: 1.02 mg/dL — ABNORMAL HIGH (ref 0.44–1.00)
GFR calc Af Amer: >60 mL/min (ref >60)
GFR calc non Af Amer: 52 mL/min — ABNORMAL LOW (ref >60)
Glucose, Bld: 155 mg/dL — ABNORMAL HIGH (ref 70–99)
Potassium: 3.4 mmol/L — ABNORMAL LOW (ref 3.5–5.1)
Sodium: 141 mmol/L (ref 135–145)
Total Bilirubin: 0.4 mg/dL (ref 0.3–1.2)
Total Protein: 4.5 g/dL — ABNORMAL LOW (ref 6.5–8.1)

## 2019-03-04 LAB — CBC WITH DIFFERENTIAL/PLATELET
Abs Immature Granulocytes: 0.01 10*3/uL (ref 0.00–0.07)
Basophils Absolute: 0 10*3/uL (ref 0.0–0.1)
Basophils Relative: 0 %
Eosinophils Absolute: 0 10*3/uL (ref 0.0–0.5)
Eosinophils Relative: 1 %
HCT: 25.1 % — ABNORMAL LOW (ref 36.0–46.0)
Hemoglobin: 8.2 g/dL — ABNORMAL LOW (ref 12.0–15.0)
Immature Granulocytes: 1 %
Lymphocytes Relative: 46 %
Lymphs Abs: 0.9 10*3/uL (ref 0.7–4.0)
MCH: 30.8 pg (ref 26.0–34.0)
MCHC: 32.7 g/dL (ref 30.0–36.0)
MCV: 94.4 fL (ref 80.0–100.0)
Monocytes Absolute: 0.2 10*3/uL (ref 0.1–1.0)
Monocytes Relative: 12 %
Neutro Abs: 0.8 10*3/uL — ABNORMAL LOW (ref 1.7–7.7)
Neutrophils Relative %: 40 %
Platelets: 78 10*3/uL — ABNORMAL LOW (ref 150–400)
RBC: 2.66 MIL/uL — ABNORMAL LOW (ref 3.87–5.11)
RDW: 13.9 % (ref 11.5–15.5)
WBC: 2 10*3/uL — ABNORMAL LOW (ref 4.0–10.5)
nRBC: 0 % (ref 0.0–0.2)

## 2019-03-04 LAB — GLUCOSE, CAPILLARY
Glucose-Capillary: 147 mg/dL — ABNORMAL HIGH (ref 70–99)
Glucose-Capillary: 129 mg/dL — ABNORMAL HIGH (ref 70–99)
Glucose-Capillary: 189 mg/dL — ABNORMAL HIGH (ref 70–99)
Glucose-Capillary: 148 mg/dL — ABNORMAL HIGH (ref 70–99)

## 2019-03-04 MED ORDER — POTASSIUM CHLORIDE 20 MEQ PO PACK
20.0000 meq | PACK | Freq: Two times a day (BID) | ORAL | Status: DC
  Administered 2019-03-04 – 2019-03-05 (×2): 20 meq via ORAL
  Filled 2019-03-04 (×3): qty 1

## 2019-03-04 NOTE — Plan of Care (Signed)
  Problem: Consults Goal: RH LIMB LOSS PATIENT EDUCATION Description: Description: See Patient Education module for eduction specifics. Outcome: Progressing Goal: Skin Care Protocol Initiated - if Braden Score 18 or less Description: If consults are not indicated, leave blank or document N/A Outcome: Progressing Goal: Nutrition Consult-if indicated Outcome: Progressing Goal: Diabetes Guidelines if Diabetic/Glucose > 140 Description: If diabetic or lab glucose is > 140 mg/dl - Initiate Diabetes/Hyperglycemia Guidelines & Document Interventions  Outcome: Progressing   Problem: RH BOWEL ELIMINATION Goal: RH STG MANAGE BOWEL WITH ASSISTANCE Description: STG Manage Bowel with moderate Assistance. Outcome: Progressing   Problem: RH BLADDER ELIMINATION Goal: RH STG MANAGE BLADDER WITH ASSISTANCE Description: STG Manage Bladder With moderate Assistance Outcome: Progressing   Problem: RH SKIN INTEGRITY Goal: RH STG ABLE TO PERFORM INCISION/WOUND CARE W/ASSISTANCE Description: STG Able To Perform Incision/Wound Care With moderate Assistance. Outcome: Progressing   Problem: RH SAFETY Goal: RH STG ADHERE TO SAFETY PRECAUTIONS W/ASSISTANCE/DEVICE Description: STG Adhere to Safety Precautions With moderate Assistance/Device. Outcome: Progressing Goal: RH STG DECREASED RISK OF FALL WITH ASSISTANCE Description: STG Decreased Risk of Fall With moderate Assistance. Outcome: Progressing   Problem: RH PAIN MANAGEMENT Goal: RH STG PAIN MANAGED AT OR BELOW PT'S PAIN GOAL Description: Patient's pain level will be <3 by discharge. Outcome: Progressing   Problem: RH KNOWLEDGE DEFICIT LIMB LOSS Goal: RH STG INCREASE KNOWLEDGE OF SELF CARE AFTER LIMB LOSS Description: Patient will verbalize how to promote good circulation in stump and leg. Outcome: Progressing

## 2019-03-04 NOTE — Progress Notes (Signed)
Occupational Therapy Session Note  Patient Details  Name: Debra Hopkins MRN: [id number] Date of Birth: [birth year]  Today's Date: 03/04/2019 OT Individual Time: 1300-1355 OT Individual Time Calculation (min): 55 min    Short Term Goals: Week 1:  OT Short Term Goal 1 (Week 1): Pt will don pants with mod A OT Short Term Goal 2 (Week 1): Pt will complete transfer to [facility] with max +1 OT Short Term Goal 3 (Week 1): Pt will propel w/c 50 ft with min A to increase home accessibility  Skilled Therapeutic Interventions/Progress Updates:    Pt resting in w/c upon arrival.  Pt commented that she didn't have any clothing and declined donning paper scrubs.  Pt agreeable to washing up seated in w/c and donning clean hospital gown. Pt stated she didn't want to get back into bed. Pt lives alone but states her son has been helping her and can help her.  Pt states her home is "accessible" and doors are 36". Will need to verify. Pt unclear about situation that hospitalized her. Pt hopes to return home.  Discussed goals and pt agrees with goals. Pt remained seated in w/c with belt alarm activated and all needs within reach.   Therapy Documentation Precautions:  Precautions Precautions: Fall Precaution Comments: R AKA NWB Restrictions Weight Bearing Restrictions: No RLE Weight Bearing: Non weight bearing Pain:  Pt denies pain this afternoon   Therapy/Group: Individual Therapy  Leroy Libman 03/04/2019, 2:40 PM

## 2019-03-04 NOTE — Progress Notes (Signed)
Physical Therapy Session Note  Patient Details  Name: Debra Hopkins MRN: [id number] Date of Birth: 01-22-1941  Today's Date: 03/04/2019 PT Individual Time: 1108-1207 PT Individual Time Calculation (min): 59 min   Short Term Goals: Week 1:  PT Short Term Goal 1 (Week 1): pt will transfer supine<>sit with min A PT Short Term Goal 2 (Week 1): pt will transfer bed<>chair with LRAD mod A PT Short Term Goal 3 (Week 1): pt will perform car transfer with LRAD mod A  Skilled Therapeutic Interventions/Progress Updates:     Session 1: Patient in bed asleep upon PT arrival. Patient easily aroused to verbal stimulation and agreeable to PT session. Patient was lethargic during session requiring verbal stimulation to remain aroused throughout session. Patient reported 8/10 R residual limb pain during session, RN made aware and provided pain medicine during session. PT provided repositioning, rest breaks, and distraction as pain interventions throughout session. Educated patient on phantom pain, muscle atrophy/trauma following amputation, shaping/shrinking, and desensitization techniques during session. PT wrapped patient's residual limb with 1-6" ACE wrap and educated on wrapping techniques. Patient performed rolling in the bed with min A with use of bed rails for reposition. PT provided patient with paper scrubs to change in to during session. Upon PT return, patient asleep and required tactile stimulation to arouse. Patient requested to continue sleeping and to work with therapy later today. Patient  Missed 5 min of skilled PT due to fatigue. Patient in bed at end of session with breaks locked, bed alarm set, and all needs within reach.   Session 2: Patient in bed upon PT arrival. Patient alert and agreeable to PT session, reported that she is not a morning person and apologized for being tired earlier this morning. Scheduling made aware to start therapies after 9am. Patient reported 11/10 R residual limb pain  during session, RN made aware and provided Tylenol during session. PT provided repositioning, rest breaks, and distraction as pain interventions throughout session. Noted purple bruising and erythema on sacrum during bed mobility, RN made aware and applied a foam sacral dressing during session.   Therapeutic Activity: Bed Mobility: Patient performed rolling R/L to place bed pan and don a incontinence brief with min A with use of bed rails. Unable to tolerate laying on R side very long due to R limb pain. She performed supine to sit with max-mod A. Provided verbal cues for rolling to her L side then pushing to her elbow then hand to sit up. She was unsuccessful using the bed pan to void and agreed to try on the [facility]. Transfers: Patient performed sit to/from stand from the bed, bedside commode, and Steady seat using the Stedy with max A +2. Patient unable able to come to a complete stand, however, able to clear hips to place Stedy pads under her today. Provided verbal cues for hand and foot placement on Stedy, reports pain on her shin if her foot is too close, and provided cues for shifting her weight forward and L to bring her over her L LE. When standing from the [facility] she had increased difficulty bringing her hips forward and required max cuing to protect her R residual limb and avoid putting it into the toilet. Patient was continent of bladder on [facility] over the toilet and required total A for peri-care and to don a new incontinence brief.   Patient in w/c with Roho cushion placed for pressure relief at end of session with breaks locked, seat belt alarm set, and  all needs within reach.    Therapy Documentation Precautions:  Precautions Precautions: Fall Precaution Comments: R AKA NWB Restrictions Weight Bearing Restrictions: No RLE Weight Bearing: Non weight bearing General: PT Amount of Missed Time (min): 5 Minutes PT Missed Treatment Reason: Patient fatigue    Therapy/Group: Individual  Therapy  Debra Hopkins PT, DPT  03/04/2019, 12:41 PM

## 2019-03-04 NOTE — Evaluation (Signed)
Speech Language Pathology Assessment and Plan  Patient Details  Name: Debra Hopkins MRN: [id number] Date of Birth: [birth year]  SLP Diagnosis: Cognitive Impairments  Rehab Potential: Good ELOS: 3-4 weeks    Today's Date: 03/04/2019 SLP Individual Time: 0915-1000 SLP Individual Time Calculation (min): 45 min   Problem List:  Patient Active Problem List   Diagnosis Date Noted  . Pressure injury of skin 03/03/2019  . S/P AKA (above knee amputation) unilateral, right ([place]) 03/02/2019  . Right below-knee amputee ([place]) 03/02/2019  . PAD (peripheral artery disease) ([place]) 02/15/2019  . Type 2 diabetes mellitus with stage 3 chronic kidney disease, without long-term current use of insulin ([place]) 10/16/2015  . Obesity 10/16/2015  . Anemia 02/21/2015  . GERD (gastroesophageal reflux disease) 02/08/2014  . Cough 02/08/2014  . Dysphagia, unspecified(787.20) 09/27/2013  . Fibromyalgia 09/27/2013  . Hypothyroidism 09/27/2013  . Aftercare following surgery of the circulatory system, [place] 02/25/2012  . Occlusion and stenosis of carotid artery without mention of cerebral infarction 02/19/2011  . CAD 07/31/2009  . Mixed hyperlipidemia 11/22/2008  . Essential hypertension, benign 11/22/2008  . PVD 11/22/2008  . DYSPNEA 11/22/2008   Past Medical History:  Past Medical History:  Diagnosis Date  . Arthritis   . CAD (coronary artery disease)   . Cancer ([place])   . Carotid artery occlusion   . Cataracts, bilateral   . DDD (degenerative disc disease)   . Diabetes mellitus    Type 2  . Diverticulitis   . Dyspnea   . Fibromyalgia   . GERD (gastroesophageal reflux disease)   . Heart murmur   . Hemorrhoids   . History of shingles   . Impaired hearing   . Macular degeneration   . Other and unspecified hyperlipidemia   . PVD (peripheral vascular disease) ([place])   . TIA (transient ischemic attack)   . Unspecified essential hypertension   . Varicose veins    Past Surgical History:  Past  Surgical History:  Procedure Laterality Date  . ABDOMINAL AORTOGRAM W/LOWER EXTREMITY Bilateral 02/15/2019   Procedure: ABDOMINAL AORTOGRAM W/LOWER EXTREMITY;  Surgeon: Waynetta Sandy, MD;  Location: [facility];  Service: Cardiovascular;  Laterality: Bilateral;  . AMPUTATION Right 02/22/2019   Procedure: RIGHT ABOVE KNEE AMPUTATION;  Surgeon: Rosetta Posner, MD;  Location: [facility];  Service: Vascular;  Laterality: Right;  . ANGIOPLASTY     left leg 2003  . APPENDECTOMY    . aspiration of a cyst in the right breast    . BALLOON DILATION N/A 10/01/2013   Procedure: BALLOON DILATION;  Surgeon: Rogene Houston, MD;  [facility];  Service: Endoscopy;  Laterality: N/A;  . BREAST BIOPSY    . BREAST BIOPSY Left   . CARDIAC CATHETERIZATION    . CAROTID ENDARTERECTOMY  12-03-06   left CEA  . CATARACT EXTRACTION W/PHACO Right 10/14/2013   Procedure: CATARACT EXTRACTION PHACO AND INTRAOCULAR LENS PLACEMENT (IOC);  Surgeon: Tonny Branch, MD;  Location: [facility];  Service: Ophthalmology;  Laterality: Right;  CDE 7.20  . CATARACT EXTRACTION W/PHACO Left 11/04/2013   Procedure: CATARACT EXTRACTION PHACO AND INTRAOCULAR LENS PLACEMENT (IOC);  Surgeon: Tonny Branch, MD;  Location: [facility];  Service: Ophthalmology;  Laterality: Left;  CDE 8.57  . COLONOSCOPY N/A 09/19/2014   Procedure: COLONOSCOPY;  Surgeon: Rogene Houston, MD;  [facility];  Service: Endoscopy;  Laterality: N/A;  7:30-8:30  . CYSTO WITH HYDRODISTENSION N/A 03/29/2016   Procedure: CYSTOSCOPY/HYDRODISTENSION;  Surgeon: Jenny Reichmann  Jeffie Pollock, MD;  Location: [facility];  Service: Urology;  Laterality: N/A;  . CYSTOSCOPY WITH INJECTION N/A 03/29/2016   Procedure: CYSTOSCOPY WITH PYRIDIUM AND MARCAINE INSTALLATION;  Surgeon: Irine Seal, MD;  Location[facility];  Service: Urology;  Laterality: N/A;  . ESOPHAGOGASTRODUODENOSCOPY N/A 10/01/2013   Procedure: ESOPHAGOGASTRODUODENOSCOPY (EGD);  Surgeon: Rogene Houston, MD;  [facility];  Service: Endoscopy;  Laterality: N/A;  120  . ESOPHAGOGASTRODUODENOSCOPY N/A 10/24/2014   Procedure: ESOPHAGOGASTRODUODENOSCOPY (EGD);  Surgeon: Rogene Houston, MD;  [facility];  Service: Endoscopy;  Laterality: N/A;  730  . FALSE ANEURYSM REPAIR Left 02/16/2019   Procedure: REPAIR LEFT FEMORAL PSEUDOANEURYSM;  Surgeon: Waynetta Sandy, MD;  Location: [facility];  Service: Vascular;  Laterality: Left;  . FEMORAL-POPLITEAL BYPASS GRAFT Right 02/16/2019   Procedure: RIGHT BYPASS GRAFT FEMORAL-POPLITEAL ARTERY USING NON-REVERSED RIGHT GREATER SAPHENOUS VEIN;  Surgeon: Waynetta Sandy, MD;  Location: [facility];  Service: Vascular;  Laterality: Right;  . GIVENS CAPSULE STUDY N/A 10/24/2014   Procedure: GIVENS CAPSULE STUDY;  Surgeon: Rogene Houston, MD;  [facility];  Service: Endoscopy;  Laterality: N/A;  . JOINT REPLACEMENT  2011   Right total knee replacement  . [place]  . KNEE ARTHROSCOPY  2009  . LOWER EXTREMITY ANGIOGRAM Right 02/16/2019   Procedure: Lower Extremity Angiogram;  Surgeon: Waynetta Sandy, MD;  Location: [facility];  Service: Vascular;  Laterality: Right;  . MALONEY DILATION N/A 10/01/2013   Procedure: Venia Minks DILATION;  Surgeon: Rogene Houston, MD;  [facility];  Service: Endoscopy;  Laterality: N/A;  . PERIPHERAL VASCULAR INTERVENTION Right 02/15/2019   Procedure: PERIPHERAL VASCULAR INTERVENTION;  Surgeon: Waynetta Sandy, MD;  Location: [facility];  Service: Cardiovascular;  Laterality: Right;  external iliac, SFA, popliteal  . SAVORY DILATION N/A 10/01/2013   Procedure: SAVORY DILATION;  Surgeon: Rogene Houston, MD;  [facility];  Service: Endoscopy;  Laterality: N/A;  . SPINE SURGERY  1997   C5-c6 fusion  . TOTAL ABDOMINAL HYSTERECTOMY  1973    Assessment / Plan / Recommendation Clinical Impression Patient is a 78 year old right-handed female history of CAD,  diabetes mellitus, TIA,left CEA 2008,fibromyalgia,macular degeneration,peripheral vascular diseasewith multiple revascularization procedures and maintained on low-dose aspirin. History taken from chart review and patient. Presented on 02/15/2019 with chronic RLE ischemic changes and toe ulceration.Recent aortogram showed aortic and iliac segments heavily calcified. 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 [name] on 02/15/2019.Noted postoperatively mottling of the right foot started on heparin drip. Underwent right common femoral enterectomy with open repair of left common femoral pseudoaneurysm on 02/16/2019.Hospital coursecomplicated bypaindue toprofound ischemic changes of her foot which was felt to be nonviable and underwent right AKAon 02/22/2019 byDr. Donnetta Hutching.Hospital coursefurther complicated by acuteblood loss anemia patient was transfused 2 unitswith latest hemoglobin 9.5. Subcutaneous heparin was initiated for DVT prophylaxis 02/23/2019 as well as remaining on low-dose aspirin.  Therapy evaluations were completed and pt was recommended for CIR. Patient admitted 03/02/19. Cognitive-linguistic evaluation requested by OT/PT due to cognitive deficits.  Patient demonstrates moderate cognitive impairments which appear exacerbated by pain and fatigue. Patient was exteremly lethargic throughout session and required constant verbal cues to maintain arousal. Patient unable to recall events from previous therapy sessions and was only oriented to person throughout session. Patient also decreased emergent awareness and problem solving with basic and  familiar tasks which impacts her overall safety. Patient became tearful throughout session, SLP provided emotional support and made recommendation for a neuropsych consult. Patient would benefit from skilled SLP intervention to maximize her cognitive functioning and overall  functional independence prior to discharge.    Skilled Therapeutic Interventions          Administered a cognitive-linguistic evaluation, please see above for details.   SLP Assessment  Patient will need skilled [facility] during CIR admission    Recommendations  Oral Care Recommendations: Oral care BID Recommendations for Other Services: Neuropsych consult Patient destination: Home Follow up Recommendations: 24 hour supervision/assistance;Home Health SLP;Outpatient SLP Equipment Recommended: None recommended by SLP    SLP Frequency 3 to 5 out of 7 days   SLP Duration  SLP Intensity  SLP Treatment/Interventions 3-4 weeks  Minumum of 1-2 x/day, 30 to 90 minutes  Cognitive remediation/compensation;Internal/external aids;Therapeutic Activities;Environmental controls;Cueing hierarchy;Functional tasks;Patient/family education    Pain No/Denies Pain  SLP Evaluation Cognition Overall Cognitive Status: Impaired/Different from baseline Arousal/Alertness: Lethargic Orientation Level: Oriented to person;Disoriented to place;Disoriented to time;Oriented to situation Attention: Focused Focused Attention: Impaired Focused Attention Impairment: Functional basic;Verbal basic Sustained Attention: Impaired Sustained Attention Impairment: Verbal basic;Functional basic Memory: Impaired Memory Impairment: Decreased short term memory Awareness: Impaired Awareness Impairment: Intellectual impairment Problem Solving: Impaired Problem Solving Impairment: Functional complex Safety/Judgment: Impaired  Comprehension Auditory Comprehension Overall Auditory Comprehension: Appears within functional limits for tasks assessed Visual Recognition/Discrimination Discrimination: Not tested Reading Comprehension Reading Status: Not tested Expression Expression Primary Mode of Expression: Verbal Verbal Expression Overall Verbal Expression: Appears within functional limits for  tasks assessed Written Expression Dominant Hand: Right Written Expression: Not tested Oral Motor Oral Motor/Sensory Function Overall Oral Motor/Sensory Function: Within functional limits Motor Speech Overall Motor Speech: Appears within functional limits for tasks assessed    Short Term Goals: Week 1: SLP Short Term Goal 1 (Week 1): Patient will demonstrate functional problem solving for basic and familair tasks with Min A verbal cues. SLP Short Term Goal 2 (Week 1): Patient will recall new, daily information with use of external aids with Mod A verbal and visual cues. SLP Short Term Goal 3 (Week 1): Patient will self-monitor and correct errors during functional tasks with Min A verbal cues. SLP Short Term Goal 4 (Week 1): Patient will demonstrate sustained attention to tasks for ~10 minutes with Min A verbal cues for redirection.  Refer to Care Plan for Long Term Goals  Recommendations for other services: Neuropsych  Discharge Criteria: Patient will be discharged from SLP if patient refuses treatment 3 consecutive times without medical reason, if treatment goals not met, if there is a change in medical status, if patient makes no progress towards goals or if patient is discharged from hospital.  The above assessment, treatment plan, treatment alternatives and goals were discussed and mutually agreed upon: by patient  Melesa Lecy 03/04/2019, 2:23 PM

## 2019-03-04 NOTE — Progress Notes (Signed)
Patient ID: Debra Hopkins, female   DOB: Nov 03, 1940, 78 y.o.   MRN: [id number] Comfortable this morning on rehab unit Right above-knee amputation incision healing nicely Right vein harvest incision looks better with staples out Right groin wound with fat necrosis.  No evidence of invasive infection. Left groin healing primarily  Stable overall.  Discussed with patient.  Continue dry dressing to the right groin.  May need eventual operative debridement as fat necrosis progresses.  Will follow with you

## 2019-03-04 NOTE — Progress Notes (Signed)
[facility] PHYSICAL MEDICINE & REHABILITATION PROGRESS NOTE   Subjective/Complaints: Very lethargic this morning. Does not wake to verbal stimuli. When shook, she did wake an say "ok" but then went back to sleep.   ROS- unable to perform due to lethargy.   Objective:   VAS [place] LOWER EXTREMITY VENOUS (DVT)  Result Date: 03/03/2019  Lower Venous Study Indications: Edema.  Limitations: Pain pain tolerance. Comparison Study: no prior Performing Technologist: Abram Sander RVS  Examination Guidelines: A complete evaluation includes B-mode imaging, spectral Doppler, color Doppler, and power Doppler as needed of all accessible portions of each vessel. Bilateral testing is considered an integral part of a complete examination. Limited examinations for reoccurring indications may be performed as noted.  +---------+---------------+---------+-----------+----------+-------------------+ LEFT     CompressibilityPhasicitySpontaneityPropertiesThrombus Aging      +---------+---------------+---------+-----------+----------+-------------------+ CFV      Full           Yes      Yes                                      +---------+---------------+---------+-----------+----------+-------------------+ SFJ      Full                                                             +---------+---------------+---------+-----------+----------+-------------------+ FV Prox  Full                                                             +---------+---------------+---------+-----------+----------+-------------------+ FV Mid   Full                                                             +---------+---------------+---------+-----------+----------+-------------------+ FV DistalFull                                                             +---------+---------------+---------+-----------+----------+-------------------+ PFV      Full                                                              +---------+---------------+---------+-----------+----------+-------------------+ POP                     Yes      Yes                  unable to tolerate  compression         +---------+---------------+---------+-----------+----------+-------------------+ PTV                                                   unable to tolerate                                                        compression         +---------+---------------+---------+-----------+----------+-------------------+ PERO                                                  Not visualized      +---------+---------------+---------+-----------+----------+-------------------+     Summary: Left: There is no evidence of deep vein thrombosis in the lower extremity. However, portions of this examination were limited- see technologist comments above. No cystic structure found in the popliteal fossa.  *See table(s) above for measurements and observations. Electronically signed by Curt Jews MD on 03/03/2019 at 4:06:25 PM.    Final    Recent Labs    03/03/19 0455 03/04/19 0427  WBC 1.9* 2.0*  HGB 8.2* 8.2*  HCT 25.4* 25.1*  PLT 69* 78*   Recent Labs    03/03/19 0455 03/04/19 0427  NA 140 141  K 3.9 3.4*  CL 109 106  CO2 24 25  GLUCOSE 160* 155*  BUN 17 27*  CREATININE 1.38* 1.02*  CALCIUM 8.7* 8.9    Intake/Output Summary (Last 24 hours) at 03/04/2019 0842 Last data filed at 03/04/2019 0454 Gross per 24 hour  Intake 1890 ml  Output 50 ml  Net 1840 ml     Physical Exam: Vital Signs Blood pressure (!) 122/42, pulse 66, temperature 98.2 F (36.8 C), resp. rate 18, weight 73.3 kg, SpO2 99 %.  General: lethargic  Head: Normocephalic and atraumatic.  Mouth/Throat: Oropharynx is clear and moist. No oropharyngeal exudate.  Dentures (+)  Eyes: Conjunctivae and EOM are normal.  Neck: No tracheal deviation present. No thyromegaly present.   Cardiovascular:  RRR  Respiratory: Effort normal.   CTA B/L- no W/R/R  GI: She exhibits no distension.  Soft, NT, ND, (+)BS hypoactive  Genitourinary: Genitourinary Comments: purewick in place- medium amber urine in container  Musculoskeletal/Neuro: Unable to participate in examination due to lethargy. R HF- couldn't lift off bed  Skin:  R groin open and draining staples out- mild localized erythema; L groin looks better than R; R AKA in good shape- staples in tact; starting to pull in dog ears; no drainage seen Psychiatric: Lethargic     Assessment/Plan: 1. 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  Physiatrist is providing close team supervision and 24 hour management of active medical problems listed below.  Physiatrist and rehab team continue to assess barriers to discharge/monitor patient progress toward functional and medical goals  Care Tool:  Bathing    Body parts bathed by patient: Right arm, Left arm, Chest, Abdomen, Face, Right upper leg, Left  upper leg   Body parts bathed by helper: Front perineal area, Buttocks, Left lower leg Body parts n/a: Right lower leg(AKA)   Bathing assist Assist Level: Moderate Assistance - Patient 50 - 74%     Upper Body Dressing/Undressing Upper body dressing   What is the patient wearing?: Pull over shirt    Upper body assist Assist Level: Contact Guard/Touching assist(EOB)    Lower Body Dressing/Undressing Lower body dressing      What is the patient wearing?: Pants, Incontinence brief     Lower body assist Assist for lower body dressing: 2 Helpers     Toileting Toileting    Toileting assist Assist for toileting: 2 Helpers     Transfers Chair/bed transfer  Transfers assist  Chair/bed transfer activity did not occur: Safety/medical concerns(Pt with increased pain and  LLE weakness limiting participation in therapy)  Chair/bed transfer assist level: Total Assistance - Patient < 25%(slideboard to the [facility])     Locomotion Ambulation   Ambulation assist   Ambulation activity did not occur: Safety/medical concerns(Pt with increased pain and LLE weakness limiting participation in therapy)          Walk 10 feet activity   Assist  Walk 10 feet activity did not occur: Safety/medical concerns(Pt with increased pain and LLE weakness limiting participation in therapy)        Walk 50 feet activity   Assist Walk 50 feet with 2 turns activity did not occur: Safety/medical concerns(Pt with increased pain and LLE weakness limiting participation in therapy)         Walk 150 feet activity   Assist Walk 150 feet activity did not occur: Safety/medical concerns(Pt with increased pain and LLE weakness limiting participation in therapy)         Walk 10 feet on uneven surface  activity   Assist Walk 10 feet on uneven surfaces activity did not occur: Safety/medical concerns(Pt with increased pain and LLE weakness limiting participation in therapy)         Wheelchair     Assist Will patient use wheelchair at discharge?: Yes Type of Wheelchair: Manual Wheelchair activity did not occur: Safety/medical concerns(Pt with increased pain and LLE weakness limiting participation in therapy)         Wheelchair 50 feet with 2 turns activity    Assist    Wheelchair 50 feet with 2 turns activity did not occur: Safety/medical concerns(Pt with increased pain and LLE weakness limiting participation in therapy)       Wheelchair 150 feet activity     Assist  Wheelchair 150 feet activity did not occur: Safety/medical concerns(Pt with increased pain and LLE weakness limiting participation in therapy)       Blood pressure (!) 122/42, pulse 66, temperature 98.2 F (36.8 C), resp. rate 18, weight 73.3 kg, SpO2 99 %.  1. Decreased  functional mobility secondary to right AKA 02/22/2019 as well as femoral to tibioperoneal trunk bypass 02/16/2019. Vancomycin resumed for wound coverage 03/01/2019  -patient may shower if R AKA wrapped  -ELOS/Goals: Min A/16-19 days.  Admit to CIR  2. Antithrombotics:  -DVT/anticoagulation: Subcutaneous heparin- suggest Doppler of LLE due to severe pain of LLE   12/23- Dopplers of LLE (-) -antiplatelet therapy: Aspirin 81 mg daily  3. Pain Management: Lyrica 25 mg 3 times daily as needed, oxycodone as needed  -likely will need more Lyrica- go back to home dose; also suggest Skelaxin for muscle relaxant since allergic to Flexeril and sedated easily; lidocaine patches 2  of them starting in AM on L calf   12/23- ordered lidoderm patches for L calf; added skelaxin for muscle pain prn; and made lyrica scheduled- probably need to increase in 2-3 days. 4. Mood: Trazodone 100 mg nightly  -antipsychotic agents: N/A  5. Neuropsych: This patient is capable of making decisions on her own behalf?- multiple word substitutions on exam- will need SLP.  6. Skin/Wound Care: Routine skin checks  7. Fluids/Electrolytes/Nutrition: Routine in and outs. CMP ordered.  8. Acute blood loss anemia. CBC stable on 12/24  9. History of TIA. Continue aspirin  10. Hypertension. Clonidine 0.2 mg nightly, hydralazine 25 mg nightly, Cozaar 100 mg daily, Demadex 20 mg daily, Procardia XL 30 mg daily. Monitor with increased mobility.  12/23- BP slightly increased 144/90s- will monitor for trends   12/24: BP wnl.  11. Hypothyroidism. Synthroid  12. Diabetes mellitus peripheral neuropathy. Hemoglobin A1c 5.9. Lantus insulin 15 units nightly. Check blood sugars before meals and at bedtime. Monitor with increased mobility.  CBG (last 3)  Recent Labs    03/03/19 1659 03/03/19 2106 03/04/19 0609  GLUCAP 189* 148* 147*    12/23- labile slightly- but overall better 13. Hyperlipidemia. Zocor  14. Constipation. Laxative assistance   15. GERD. Protonix  16. Thrombocytopenia-   12/23- plts down to 69k- has been dropping- will ask team to address this along with leukopenia. 17. Leukopenia  12/23- WBC down to 1.9 18. Anemia-   12/23- down to 8.2 from 9.3 AND Cr increased- will ask team to call Hematology in AM  12/24: Hgb stable 19. AKI-   12/23- Cr 1.38 up from 1.04- BUN not elevated- Likely due to IV Vanc  12/24: Cr decreased to 1.02 20. Infection?- Vanc restarted due to increased drainage from groin-WBC dropped even more- will need to recheck labs in AM- if worse, needs IM vs ID consult to see if needs IV Vanc.   21. Hypokalemia: Will add Klor supplement and repeat BMP tomorrow.   LOS: 2 days A FACE TO FACE EVALUATION WAS PERFORMED  Clide Deutscher Jimesha Rising 03/04/2019, 8:42 AM

## 2019-03-04 NOTE — Progress Notes (Signed)
This writer went to change dressing on right side Aka mid anterior groin and mid anterior thigh. Pt has pain to both areas, but more pain to mid anterior groin. Pt expresses pain with faces and moans, but won't except any pain pill relief.  Pt mid anterior groin is dehisced, fatty tissue showing,  With serosanguinous drainage. Area was cleaned and new dressing applied.

## 2019-03-05 LAB — CBC
HCT: 23.1 % — ABNORMAL LOW (ref 36.0–46.0)
Hemoglobin: 7.4 g/dL — ABNORMAL LOW (ref 12.0–15.0)
MCH: 30.1 pg (ref 26.0–34.0)
MCHC: 32 g/dL (ref 30.0–36.0)
MCV: 93.9 fL (ref 80.0–100.0)
Platelets: 79 10*3/uL — ABNORMAL LOW (ref 150–400)
RBC: 2.46 MIL/uL — ABNORMAL LOW (ref 3.87–5.11)
RDW: 13.6 % (ref 11.5–15.5)
WBC: 2 10*3/uL — ABNORMAL LOW (ref 4.0–10.5)
nRBC: 0 % (ref 0.0–0.2)

## 2019-03-05 LAB — GLUCOSE, CAPILLARY
Glucose-Capillary: 142 mg/dL — ABNORMAL HIGH (ref 70–99)
Glucose-Capillary: 116 mg/dL — ABNORMAL HIGH (ref 70–99)
Glucose-Capillary: 169 mg/dL — ABNORMAL HIGH (ref 70–99)

## 2019-03-05 LAB — BASIC METABOLIC PANEL
Anion gap: 8 (ref 5–15)
BUN: 38 mg/dL — ABNORMAL HIGH (ref 8–23)
CO2: 26 mmol/L (ref 22–32)
Calcium: 8.9 mg/dL (ref 8.9–10.3)
Chloride: 106 mmol/L (ref 98–111)
Creatinine, Ser: 1.19 mg/dL — ABNORMAL HIGH (ref 0.44–1.00)
GFR calc Af Amer: 50 mL/min — ABNORMAL LOW (ref >60)
GFR calc non Af Amer: 43 mL/min — ABNORMAL LOW (ref >60)
Glucose, Bld: 150 mg/dL — ABNORMAL HIGH (ref 70–99)
Potassium: 4 mmol/L (ref 3.5–5.1)
Sodium: 140 mmol/L (ref 135–145)

## 2019-03-05 MED ORDER — HYDRALAZINE HCL 25 MG PO TABS
25.0000 mg | ORAL_TABLET | Freq: Two times a day (BID) | ORAL | Status: DC
  Administered 2019-03-05 – 2019-03-06 (×3): 25 mg via ORAL
  Filled 2019-03-05 (×4): qty 1

## 2019-03-05 NOTE — Progress Notes (Signed)
[name] PHYSICAL MEDICINE & REHABILITATION PROGRESS NOTE   Subjective/Complaints: Much less lethargic this morning, though reports she is sleepy. Has some coccyx pain.  Moving bowels regularly.  ROS- pt denies CP, SOB, Abd pain, N/V/D/constipation, HA  Objective:   VAS [place] LOWER EXTREMITY VENOUS (DVT)  Result Date: 03/03/2019  Lower Venous Study Indications: Edema.  Limitations: Pain pain tolerance. Comparison Study: no prior Performing Technologist: Abram Sander RVS  Examination Guidelines: A complete evaluation includes B-mode imaging, spectral Doppler, color Doppler, and power Doppler as needed of all accessible portions of each vessel. Bilateral testing is considered an integral part of a complete examination. Limited examinations for reoccurring indications may be performed as noted.  +---------+---------------+---------+-----------+----------+-------------------+ LEFT     CompressibilityPhasicitySpontaneityPropertiesThrombus Aging      +---------+---------------+---------+-----------+----------+-------------------+ CFV      Full           Yes      Yes                                      +---------+---------------+---------+-----------+----------+-------------------+ SFJ      Full                                                             +---------+---------------+---------+-----------+----------+-------------------+ FV Prox  Full                                                             +---------+---------------+---------+-----------+----------+-------------------+ FV Mid   Full                                                             +---------+---------------+---------+-----------+----------+-------------------+ FV DistalFull                                                             +---------+---------------+---------+-----------+----------+-------------------+ PFV      Full                                                              +---------+---------------+---------+-----------+----------+-------------------+ POP                     Yes      Yes                  unable to tolerate  compression         +---------+---------------+---------+-----------+----------+-------------------+ PTV                                                   unable to tolerate                                                        compression         +---------+---------------+---------+-----------+----------+-------------------+ PERO                                                  Not visualized      +---------+---------------+---------+-----------+----------+-------------------+     Summary: Left: There is no evidence of deep vein thrombosis in the lower extremity. However, portions of this examination were limited- see technologist comments above. No cystic structure found in the popliteal fossa.  *See table(s) above for measurements and observations. Electronically signed by Curt Jews MD on 03/03/2019 at 4:06:25 PM.    Final    Recent Labs    03/04/19 0427 03/05/19 0408  WBC 2.0* 2.0*  HGB 8.2* 7.4*  HCT 25.1* 23.1*  PLT 78* 79*   Recent Labs    03/04/19 0427 03/05/19 0408  NA 141 140  K 3.4* 4.0  CL 106 106  CO2 25 26  GLUCOSE 155* 150*  BUN 27* 38*  CREATININE 1.02* 1.19*  CALCIUM 8.9 8.9    Intake/Output Summary (Last 24 hours) at 03/05/2019 0805 Last data filed at 03/04/2019 2018 Gross per 24 hour  Intake 250 ml  Output 400 ml  Net -150 ml     Physical Exam: Vital Signs Blood pressure (!) 148/52, pulse 89, temperature 97.9 F (36.6 C), resp. rate 18, weight 73.3 kg, SpO2 98 %.  General: lying in bed, sleepy, NAD Head: Normocephalic and atraumatic.  Mouth/Throat: Oropharynx is clear and moist. No oropharyngeal exudate.  Dentures (+)  Eyes: Conjunctivae and EOM are normal.  Neck: No tracheal deviation present. No thyromegaly  present.  Cardiovascular:  RRR  Respiratory: Effort normal.   CTA B/L- no W/R/R  GI: She exhibits no distension.  Soft, NT, ND, (+) BS hypoactive  Genitourinary: Genitourinary Comments: purewick in place- medium amber urine in container  Musculoskeletal:  Cervical back: Normal range of motion and neck supple.  Comments: Right AKA with tenderness and edema UEs 5-5/ B/L- in Deltoid, biceps, triceps, WE, grip and finger abd LLE- HF 2+/5 , KE ~3/5- couldn't tolerate any touch to LLE; DF and PF 4+/5 R HF- couldn't lift off bed  Neurological: She is alert.  Skin:  R groin open and draining staples out- mild localized erythema; L groin looks better than R; R AKA stable- staples in tact; starting to pull in dog ears; no drainage seen Psychiatric: Lethargic  Assessment/Plan: 1. 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  Physiatrist is providing close team supervision and 24 hour management of active medical problems listed below.  Physiatrist and rehab team continue to assess barriers to discharge/monitor patient progress toward functional and medical goals  Care Tool:  Bathing    Body parts bathed by patient: Right arm, Left arm, Chest, Abdomen, Face, Right upper leg, Left upper leg   Body parts bathed by helper: Front perineal area, Buttocks, Left lower leg Body parts n/a: Right lower leg(AKA)   Bathing assist Assist Level: Moderate Assistance - Patient 50 - 74%     Upper Body Dressing/Undressing Upper body dressing   What is the patient wearing?: Hospital gown only    Upper body assist Assist Level: Moderate Assistance - Patient 50 - 74%    Lower Body Dressing/Undressing Lower body dressing      What is the patient wearing?: Incontinence brief, Hospital gown only     Lower body assist Assist for lower body dressing: Moderate  Assistance - Patient 50 - 74%     Toileting Toileting    Toileting assist Assist for toileting: 2 Helpers     Transfers Chair/bed transfer  Transfers assist  Chair/bed transfer activity did not occur: Safety/medical concerns(Pt with increased pain and LLE weakness limiting participation in therapy)  Chair/bed transfer assist level: (Stedy +2)     Locomotion Ambulation   Ambulation assist   Ambulation activity did not occur: Safety/medical concerns(Pt with increased pain and LLE weakness limiting participation in therapy)          Walk 10 feet activity   Assist  Walk 10 feet activity did not occur: Safety/medical concerns(Pt with increased pain and LLE weakness limiting participation in therapy)        Walk 50 feet activity   Assist Walk 50 feet with 2 turns activity did not occur: Safety/medical concerns(Pt with increased pain and LLE weakness limiting participation in therapy)         Walk 150 feet activity   Assist Walk 150 feet activity did not occur: Safety/medical concerns(Pt with increased pain and LLE weakness limiting participation in therapy)         Walk 10 feet on uneven surface  activity   Assist Walk 10 feet on uneven surfaces activity did not occur: Safety/medical concerns(Pt with increased pain and LLE weakness limiting participation in therapy)         Wheelchair     Assist Will patient use wheelchair at discharge?: Yes Type of Wheelchair: Manual Wheelchair activity did not occur: Safety/medical concerns(Pt with increased pain and LLE weakness limiting participation in therapy)         Wheelchair 50 feet with 2 turns activity    Assist    Wheelchair 50 feet with 2 turns activity did not occur: Safety/medical concerns(Pt with increased pain and LLE weakness limiting participation in therapy)       Wheelchair 150 feet activity     Assist  Wheelchair 150 feet activity did not occur: Safety/medical concerns(Pt  with increased pain and LLE weakness limiting participation in therapy)       Blood pressure (!) 148/52, pulse 89, temperature 97.9 F (36.6 C), resp. rate 18, weight 73.3 kg, SpO2 98 %.  1. 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 Vancomycin resumed for wound coverage 03/01/2019  -patient may shower if R AKA wrapped  -ELOS/Goals: Min A/16-19 days.  Admit to CIR  2. Antithrombotics:  -DVT/anticoagulation: Subcutaneous heparin- suggest Doppler of LLE due to severe pain of LLE   12/23- Dopplers of LLE (-) -antiplatelet therapy: Aspirin  81 mg daily  3. Pain Management: Lyrica 25 mg 3 times daily as needed, oxycodone as needed  -likely will need more Lyrica- go back to home dose; also suggest Skelaxin for muscle relaxant since allergic to Flexeril and sedated easily; lidocaine patches 2 of them starting in AM on L calf   12/23- ordered lidoderm patches for L calf; added skelaxin for muscle pain prn; and made lyrica scheduled- probably need to increase in 2-3 days.  12/25: Added Lidocaine patch for coccyx pain 4. Mood: Trazodone 100 mg nightly  -antipsychotic agents: N/A  5. Neuropsych: This patient is capable of making decisions on her own behalf?- multiple word substitutions on exam- will need SLP.  6. Skin/Wound Care: Routine skin checks  7. Fluids/Electrolytes/Nutrition: Routine in and outs. CMP ordered.  8. Acute blood loss anemia. CBC stable on 12/24, with decreased Hgb on 12/25, will repeat tomorrow.   9. History of TIA. Continue aspirin  10. Hypertension. Clonidine 0.2 mg nightly, hydralazine 25 mg nightly, Cozaar 100 mg daily, Demadex 20 mg daily, Procardia XL 30 mg daily. Monitor with increased mobility.  12/23- BP slightly increased 144/90s- will monitor for trends   12/24: BP wnl.   12/25: BP increased to 148/52 this morning; has been up to [id number] systolic in last few days. Will change Hydralazine to  25mg  BID.   11. Hypothyroidism. Synthroid  12. Diabetes mellitus peripheral neuropathy. Hemoglobin A1c 5.9. Lantus insulin 15 units nightly. Check blood sugars before meals and at bedtime. Monitor with increased mobility.  CBG (last 3)  Recent Labs    03/04/19 1651 03/04/19 2110 03/05/19 0627  GLUCAP 189* 148* 142*    12/23- labile slightly- but overall better 13. Hyperlipidemia. Zocor  14. Constipation. Laxative assistance  15. GERD. Protonix  16. Thrombocytopenia-   12/23- plts down to 69k- has been dropping- will ask team to address this along with leukopenia. 17. Leukopenia  12/23- WBC down to 1.9 18. Anemia-   12/23- down to 8.2 from 9.3 AND Cr increased- will ask team to call Hematology in AM  12/24: Hgb stable 19. AKI-   12/23- Cr 1.38 up from 1.04- BUN not elevated- Likely due to IV Vanc  12/24: Cr decreased to 1.02  12/25: Cr increased to 1.19, will repeat tomorrow, encouraged fluid intake. 20. Infection?- Vanc restarted due to increased drainage from groin-WBC dropped even more- will need to recheck labs in AM- if worse, needs IM vs ID consult to see if needs IV Vanc.   21. Hypokalemia: Will add Klor supplement and repeat BMP tomorrow.   12/25: K+ normalized. Will discontinue supplement.   LOS: 3 days A FACE TO FACE EVALUATION WAS PERFORMED  Yacine Droz P Majesty Oehlert 03/05/2019, 8:05 AM

## 2019-03-06 ENCOUNTER — Inpatient Hospital Stay (HOSPITAL_COMMUNITY): Payer: Medicare Other

## 2019-03-06 ENCOUNTER — Inpatient Hospital Stay (HOSPITAL_COMMUNITY): Payer: Medicare Other | Admitting: Occupational Therapy

## 2019-03-06 LAB — CBC
HCT: 24 % — ABNORMAL LOW (ref 36.0–46.0)
Hemoglobin: 7.8 g/dL — ABNORMAL LOW (ref 12.0–15.0)
MCH: 30.5 pg (ref 26.0–34.0)
MCHC: 32.5 g/dL (ref 30.0–36.0)
MCV: 93.8 fL (ref 80.0–100.0)
Platelets: 96 10*3/uL — ABNORMAL LOW (ref 150–400)
RBC: 2.56 MIL/uL — ABNORMAL LOW (ref 3.87–5.11)
RDW: 13.6 % (ref 11.5–15.5)
WBC: 2.4 10*3/uL — ABNORMAL LOW (ref 4.0–10.5)
nRBC: 0 % (ref 0.0–0.2)

## 2019-03-06 LAB — BASIC METABOLIC PANEL
Anion gap: 9 (ref 5–15)
BUN: 36 mg/dL — ABNORMAL HIGH (ref 8–23)
CO2: 26 mmol/L (ref 22–32)
Calcium: 9 mg/dL (ref 8.9–10.3)
Chloride: 106 mmol/L (ref 98–111)
Creatinine, Ser: 1.15 mg/dL — ABNORMAL HIGH (ref 0.44–1.00)
GFR calc Af Amer: 52 mL/min — ABNORMAL LOW (ref >60)
GFR calc non Af Amer: 45 mL/min — ABNORMAL LOW (ref >60)
Glucose, Bld: 144 mg/dL — ABNORMAL HIGH (ref 70–99)
Potassium: 3.9 mmol/L (ref 3.5–5.1)
Sodium: 141 mmol/L (ref 135–145)

## 2019-03-06 LAB — GLUCOSE, CAPILLARY
Glucose-Capillary: 144 mg/dL — ABNORMAL HIGH (ref 70–99)
Glucose-Capillary: 184 mg/dL — ABNORMAL HIGH (ref 70–99)
Glucose-Capillary: 186 mg/dL — ABNORMAL HIGH (ref 70–99)
Glucose-Capillary: 149 mg/dL — ABNORMAL HIGH (ref 70–99)

## 2019-03-06 MED ORDER — ACETAMINOPHEN 325 MG PO TABS
650.0000 mg | ORAL_TABLET | Freq: Three times a day (TID) | ORAL | Status: DC
  Administered 2019-03-06 – 2019-03-15 (×25): 650 mg via ORAL
  Filled 2019-03-06 (×26): qty 2

## 2019-03-06 NOTE — Plan of Care (Signed)
  Problem: Consults Goal: RH LIMB LOSS PATIENT EDUCATION Description: Description: See Patient Education module for eduction specifics. 03/06/2019 1207 by Rodolph Bong, LPN Outcome: Progressing 03/06/2019 1206 by Rodolph Bong, LPN Outcome: Progressing Goal: Skin Care Protocol Initiated - if Braden Score 18 or less Description: If consults are not indicated, leave blank or document N/A 03/06/2019 1207 by Rodolph Bong, LPN Outcome: Progressing 03/06/2019 1206 by Rodolph Bong, LPN Outcome: Progressing Goal: Nutrition Consult-if indicated 03/06/2019 1207 by Rodolph Bong, LPN Outcome: Progressing 03/06/2019 1206 by Rodolph Bong, LPN Outcome: Progressing Goal: Diabetes Guidelines if Diabetic/Glucose > 140 Description: If diabetic or lab glucose is > 140 mg/dl - Initiate Diabetes/Hyperglycemia Guidelines & Document Interventions  03/06/2019 1207 by Rodolph Bong, LPN Outcome: Progressing 03/06/2019 1206 by Rodolph Bong, LPN Outcome: Progressing   Problem: RH BOWEL ELIMINATION Goal: RH STG MANAGE BOWEL WITH ASSISTANCE Description: STG Manage Bowel with moderate Assistance. 03/06/2019 1207 by Rodolph Bong, LPN Outcome: Progressing 03/06/2019 1206 by Rodolph Bong, LPN Outcome: Progressing   Problem: RH BLADDER ELIMINATION Goal: RH STG MANAGE BLADDER WITH ASSISTANCE Description: STG Manage Bladder With moderate Assistance 03/06/2019 1207 by Rodolph Bong, LPN Outcome: Progressing 03/06/2019 1206 by Rodolph Bong, LPN Outcome: Progressing   Problem: RH SKIN INTEGRITY Goal: RH STG ABLE TO PERFORM INCISION/WOUND CARE W/ASSISTANCE Description: STG Able To Perform Incision/Wound Care With moderate Assistance. 03/06/2019 1207 by Rodolph Bong, LPN Outcome: Progressing 03/06/2019 1206 by Rodolph Bong, LPN Outcome: Progressing   Problem: RH SAFETY Goal: RH STG ADHERE TO SAFETY PRECAUTIONS W/ASSISTANCE/DEVICE Description: STG Adhere to Safety Precautions With  moderate Assistance/Device. 03/06/2019 1207 by Rodolph Bong, LPN Outcome: Progressing 03/06/2019 1206 by Rodolph Bong, LPN Outcome: Progressing Goal: RH STG DECREASED RISK OF FALL WITH ASSISTANCE Description: STG Decreased Risk of Fall With moderate Assistance. 03/06/2019 1207 by Rodolph Bong, LPN Outcome: Progressing 03/06/2019 1206 by Rodolph Bong, LPN Outcome: Progressing   Problem: RH PAIN MANAGEMENT Goal: RH STG PAIN MANAGED AT OR BELOW PT'S PAIN GOAL Description: Patient's pain level will be <3 by discharge. 03/06/2019 1207 by Rodolph Bong, LPN Outcome: Progressing 03/06/2019 1206 by Rodolph Bong, LPN Outcome: Progressing   Problem: RH KNOWLEDGE DEFICIT LIMB LOSS Goal: RH STG INCREASE KNOWLEDGE OF SELF CARE AFTER LIMB LOSS Description: Patient will verbalize how to promote good circulation in stump and leg. 03/06/2019 1207 by Rodolph Bong, LPN Outcome: Progressing 03/06/2019 1206 by Rodolph Bong, LPN Outcome: Progressing

## 2019-03-06 NOTE — Progress Notes (Signed)
[place] PHYSICAL MEDICINE & REHABILITATION PROGRESS NOTE   Subjective/Complaints: Much less lethargic this morning, though reports she is sleepy. Has some coccyx pain.  Moving bowels regularly. As per therapy notes, sessions are limited by fatigue and residual limb pain.   ROS- pt denies CP, SOB, Abd pain, N/V/D/constipation, HA  Objective:   No results found. Recent Labs    03/05/19 0408 03/06/19 0305  WBC 2.0* 2.4*  HGB 7.4* 7.8*  HCT 23.1* 24.0*  PLT 79* 96*   Recent Labs    03/05/19 0408 03/06/19 0305  NA 140 141  K 4.0 3.9  CL 106 106  CO2 26 26  GLUCOSE 150* 144*  BUN 38* 36*  CREATININE 1.19* 1.15*  CALCIUM 8.9 9.0    Intake/Output Summary (Last 24 hours) at 03/06/2019 1308 Last data filed at 03/06/2019 0730 Gross per 24 hour  Intake 330 ml  Output --  Net 330 ml     Physical Exam: Vital Signs Blood pressure (!) 145/59, pulse 81, temperature 99.5 F (37.5 C), temperature source Oral, resp. rate 18, weight 73.3 kg, SpO2 98 %.  General: lying in bed, sleepy, NAD Head: Normocephalic and atraumatic.  Mouth/Throat: Oropharynx is clear and moist. No oropharyngeal exudate.  Dentures (+)  Eyes: Conjunctivae and EOM are normal.  Neck: No tracheal deviation present. No thyromegaly present.  Cardiovascular:  RRR  Respiratory: Effort normal.   CTA B/L- no W/R/R  GI: She exhibits no distension.  Soft, NT, ND, (+) BS hypoactive  Genitourinary: Genitourinary Comments: purewick in place- medium amber urine in container  Musculoskeletal:  Cervical back: Normal range of motion and neck supple.  Comments: Right AKA with tenderness and edema UEs 5-5/ B/L- in Deltoid, biceps, triceps, WE, grip and finger abd LLE- HF 2+/5 , KE ~3/5- couldn't tolerate any touch to LLE; DF and PF 4+/5 R HF- couldn't lift off bed  Neurological: She is alert.  Skin:  R groin open and draining staples out- mild localized erythema; L groin looks better than R; R AKA stable-  staples in tact; starting to pull in dog ears; no drainage seen Psychiatric: Lethargic  Assessment/Plan: 1. 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  Physiatrist is providing close team supervision and 24 hour management of active medical problems listed below.  Physiatrist and rehab team continue to assess barriers to discharge/monitor patient progress toward functional and medical goals  Care Tool:  Bathing    Body parts bathed by patient: Right arm, Left arm, Chest, Abdomen, Face, Right upper leg, Left upper leg   Body parts bathed by helper: Front perineal area, Buttocks, Left lower leg Body parts n/a: Right lower leg(AKA)   Bathing assist Assist Level: Moderate Assistance - Patient 50 - 74%     Upper Body Dressing/Undressing Upper body dressing   What is the patient wearing?: Hospital gown only    Upper body assist Assist Level: Moderate Assistance - Patient 50 - 74%    Lower Body Dressing/Undressing Lower body dressing      What is the patient wearing?: Incontinence brief, Hospital gown only     Lower body assist Assist for lower body dressing: Moderate Assistance - Patient 50 - 74%     Toileting Toileting    Toileting assist Assist for toileting: 2 Helpers     Transfers Chair/bed transfer  Transfers assist  Chair/bed transfer activity did not occur:  Safety/medical concerns(Pt with increased pain and LLE weakness limiting participation in therapy)  Chair/bed transfer assist level: (Stedy +2)     Locomotion Ambulation   Ambulation assist   Ambulation activity did not occur: Safety/medical concerns(Pt with increased pain and LLE weakness limiting participation in therapy)          Walk 10 feet activity   Assist  Walk 10 feet activity did not occur: Safety/medical concerns(Pt with increased pain  and LLE weakness limiting participation in therapy)        Walk 50 feet activity   Assist Walk 50 feet with 2 turns activity did not occur: Safety/medical concerns(Pt with increased pain and LLE weakness limiting participation in therapy)         Walk 150 feet activity   Assist Walk 150 feet activity did not occur: Safety/medical concerns(Pt with increased pain and LLE weakness limiting participation in therapy)         Walk 10 feet on uneven surface  activity   Assist Walk 10 feet on uneven surfaces activity did not occur: Safety/medical concerns(Pt with increased pain and LLE weakness limiting participation in therapy)         Wheelchair     Assist Will patient use wheelchair at discharge?: Yes Type of Wheelchair: Manual Wheelchair activity did not occur: Safety/medical concerns(Pt with increased pain and LLE weakness limiting participation in therapy)         Wheelchair 50 feet with 2 turns activity    Assist    Wheelchair 50 feet with 2 turns activity did not occur: Safety/medical concerns(Pt with increased pain and LLE weakness limiting participation in therapy)       Wheelchair 150 feet activity     Assist  Wheelchair 150 feet activity did not occur: Safety/medical concerns(Pt with increased pain and LLE weakness limiting participation in therapy)       Blood pressure (!) 145/59, pulse 81, temperature 99.5 F (37.5 C), temperature source Oral, resp. rate 18, weight 73.3 kg, SpO2 98 %.  1. 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 Vancomycin resumed for wound coverage 03/01/2019  -patient may shower if R AKA wrapped  -ELOS/Goals: Min A/16-19 days.  Admit to CIR  2. Antithrombotics:  -DVT/anticoagulation: Subcutaneous heparin- suggest Doppler of LLE due to severe pain of LLE   12/23- Dopplers of LLE (-) -antiplatelet therapy: Aspirin 81 mg daily  3. Pain  Management: Lyrica 25 mg 3 times daily as needed, oxycodone as needed  -likely will need more Lyrica- go back to home dose; also suggest Skelaxin for muscle relaxant since allergic to Flexeril and sedated easily; lidocaine patches 2 of them starting in AM on L calf   12/23- ordered lidoderm patches for L calf; added skelaxin for muscle pain prn; and made lyrica scheduled- probably need to increase in 2-3 days.  12/25: Added Lidocaine patch for coccyx pain  12/26: will schedule tylenol for better pain control. Do not want to increase her sedating pain meds as she is already fatigued.  4. Mood: Trazodone 100 mg nightly  -antipsychotic agents: N/A  5. Neuropsych: This patient is capable of making decisions on her own behalf?- multiple word substitutions on exam- will need SLP.  6. Skin/Wound Care: Routine skin checks  7. Fluids/Electrolytes/Nutrition: Routine in and outs. CMP ordered.  8. Acute blood loss anemia. CBC stable on 12/24, with decreased Hgb on 12/25, will repeat tomorrow.    12/26: Hgb with increase.  9. History of TIA. Continue aspirin  10. Hypertension. Clonidine 0.2 mg nightly, hydralazine 25 mg nightly, Cozaar 100 mg daily, Demadex 20 mg daily, Procardia XL 30 mg daily. Monitor with increased mobility.  12/23- BP slightly increased 144/90s- will monitor for trends   12/24: BP wnl.   12/25: BP increased to 148/52 this morning; has been up to [id number] systolic in last few days. Will change Hydralazine to 25mg  BID.   11. Hypothyroidism. Synthroid  12. Diabetes mellitus peripheral neuropathy. Hemoglobin A1c 5.9. Lantus insulin 15 units nightly. Check blood sugars before meals and at bedtime. Monitor with increased mobility.  CBG (last 3)  Recent Labs    03/05/19 2122 03/06/19 0614 03/06/19 1144  GLUCAP 169* 144* 184*    12/23- labile slightly- but overall better 13. Hyperlipidemia. Zocor  14. Constipation. Laxative assistance  15. GERD. Protonix  16. Thrombocytopenia-   12/23-  plts down to 69k- has been dropping- will ask team to address this along with leukopenia. 17. Leukopenia  12/23- WBC down to 1.9 18. Anemia-   12/23- down to 8.2 from 9.3 AND Cr increased- will ask team to call Hematology in AM  12/24-12/26: Hgb stable 19. AKI-   12/23- Cr 1.38 up from 1.04- BUN not elevated- Likely due to IV Vanc  12/24: Cr decreased to 1.02  12/25: Cr increased to 1.19, will repeat tomorrow, encouraged fluid intake.  12/26: Cr stable 20. Infection?- Vanc restarted due to increased drainage from groin-WBC dropped even more- will need to recheck labs in AM- if worse, needs IM vs ID consult to see if needs IV Vanc.   21. Hypokalemia: Will add Klor supplement and repeat BMP tomorrow.   12/25: K+ normalized. Will discontinue supplement.   LOS: 4 days A FACE TO FACE EVALUATION WAS PERFORMED  Debra Hopkins Debra Hopkins 03/06/2019, 1:08 PM

## 2019-03-06 NOTE — Progress Notes (Signed)
Speech Language Pathology Daily Session Note  Patient Details  Name: Debra Hopkins MRN: [id number] Date of Birth: 1941/03/11  Today's Date: 03/06/2019 SLP Individual Time: 0900-0950 SLP Individual Time Calculation (min): 50 min  Short Term Goals: Week 1: SLP Short Term Goal 1 (Week 1): Patient will demonstrate functional problem solving for basic and familair tasks with Min A verbal cues. SLP Short Term Goal 2 (Week 1): Patient will recall new, daily information with use of external aids with Mod A verbal and visual cues. SLP Short Term Goal 3 (Week 1): Patient will self-monitor and correct errors during functional tasks with Min A verbal cues. SLP Short Term Goal 4 (Week 1): Patient will demonstrate sustained attention to tasks for ~10 minutes with Min A verbal cues for redirection.  Skilled Therapeutic Interventions: Skilled ST services focused on cognitive skills. Pt provided inconsistent report of responsibilities piror to admission pertaining to money/medication management, it appears her son's was assisting with both. SLP facilitated continued cognitive linguistic assessment utilizing Cognistat, pt demonstrated moderate impairments in orientation (time/place), sustain attention, memory and following multistep commands,with mild impairments in calculation and judgement. Pt was unable to complete construction task due to pain and requested session to end early. Pt was left in room with call bell within reach and bed alarm set. ST recommends to continue skilled ST services.      Pain Pain Assessment Faces Pain Scale: Hurts whole lot Pain Type: Acute pain Pain Location: Leg Pain Orientation: Left Pain Descriptors / Indicators: Aching Pain Onset: Sudden Pain Intervention(s): Repositioned  Therapy/Group: Individual Therapy  Rondrick Barreira  [facility] 03/06/2019, 12:51 PM

## 2019-03-06 NOTE — IPOC Note (Signed)
Overall Plan of Care [facility]) Patient Details Name: Debra Hopkins MRN: [id number] DOB: 1940-04-03  Admitting Diagnosis: S/P AKA (above knee amputation) unilateral, right [facility])  Hospital Problems: Principal Problem:   S/P AKA (above knee amputation) unilateral, right (HCC) Active Problems:   Essential hypertension, benign   PVD   Anemia   Type 2 diabetes mellitus with stage 3 chronic kidney disease, without long-term current use of insulin (HCC)   Right below-knee amputee (HCC)   Pressure injury of skin     Functional Problem List: Nursing Pain, Bladder, Bowel, Medication Management, Safety, Sensory, Skin Integrity  PT Balance, Endurance, Motor, Pain, Safety  OT Balance, Safety, Edema, Sensory, Skin Integrity, Endurance, Motor, Pain  SLP Cognition  TR         Basic ADL's: OT Grooming, Bathing, Dressing, Toileting     Advanced  ADL's: OT       Transfers: PT Bed Mobility, Bed to Chair, Car, Manufacturing systems engineer, Metallurgist: PT Ambulation, Emergency planning/management officer, Stairs     Additional Impairments: OT None  SLP Social Cognition   Social Interaction, Problem Solving, Memory, Attention, Awareness  TR      Anticipated Outcomes Item Anticipated Outcome  Self Feeding no goal set  Swallowing      Basic self-care  mod I  Toileting  mod I   Bathroom Transfers mod I  Bowel/Bladder  Patient will manage bowell/bladder with modeate assistance by discharge.  Transfers  Mod I with LRAD  Locomotion  Supervision with LRAD  Communication     Cognition  Supervision  Pain  Patient will have a pain score <4 before discharge.  Safety/Judgment  Patient will verbalize ways to prevent falls during hospitalization.   Therapy Plan: PT Frequency: 5 out of 7 days PT Duration Estimated Length of Stay: 26-28 OT Intensity: Minimum of 1-2 x/day, 45 to 90 minutes OT Frequency: 5 out of 7 days OT Duration/Estimated Length of Stay: 3-4 weeks SLP Intensity: Minumum of  1-2 x/day, 30 to 90 minutes SLP Frequency: 3 to 5 out of 7 days SLP Duration/Estimated Length of Stay: 3-4 weeks   Due to the current state of emergency, patients may not be receiving their 3-hours of Medicare-mandated therapy.   Team Interventions: Nursing Interventions Patient/Family Education, Bladder Management, Bowel Management, Medication Management, Pain Management, Disease Management/Prevention, Skin Care/Wound Management, Discharge Planning, Psychosocial Support  PT interventions Ambulation/gait training, Discharge planning, Functional mobility training, Psychosocial support, Therapeutic Activities, Balance/vestibular training, Disease management/prevention, Neuromuscular re-education, Skin care/wound management, Therapeutic Exercise, Wheelchair propulsion/positioning, DME/adaptive equipment instruction, Pain management, Splinting/orthotics, UE/LE Strength taining/ROM, Community reintegration, Barrister's clerk education, IT trainer, UE/LE Coordination activities  OT Interventions Training and development officer, Discharge planning, Pain management, Self Care/advanced ADL retraining, Therapeutic Activities, UE/LE Coordination activities, Therapeutic Exercise, Skin care/wound managment, Patient/family education, Functional mobility training, Disease mangement/prevention, Academic librarian, Engineer, drilling, Psychosocial support, UE/LE Strength taining/ROM, Splinting/orthotics, Wheelchair propulsion/positioning  SLP Interventions Cognitive remediation/compensation, Internal/external aids, Therapeutic Activities, Environmental controls, Cueing hierarchy, Functional tasks, Patient/family education  TR Interventions    SW/CM Interventions     Barriers to Discharge MD  Medical stability and Behavior  Nursing      PT Decreased caregiver support, Medical stability, Wound Care, Weight bearing restrictions Extremly limited by pain  OT Decreased caregiver support unclear if  family will provide supervision in the home if needed  SLP      SW       Team Discharge Planning: Destination: PT-Home ,OT- Home , SLP-Home Projected Follow-up:  PT-Home health PT, OT-  None, SLP-24 hour supervision/assistance, Home Health SLP, Outpatient SLP Projected Equipment Needs: PT-To be determined, OT- To be determined, SLP-None recommended by SLP Equipment Details: PT-has rollator at home, OT-  Patient/family involved in discharge planning: PT- Patient,  OT-Patient, SLP-Patient  MD ELOS: 16-19 days Medical Rehab Prognosis:  Good Assessment: [name] is agreeable to PT, OT, and SLP and current therapy is limited by fatigue and pain.    See Team Conference Notes for weekly updates to the plan of care

## 2019-03-06 NOTE — Progress Notes (Signed)
Pharmacy Antibiotic Note  Debra Hopkins is a 78 y.o. female s/p R Fem-tp bypass, now with possible calf wound infection.  Pharmacy has been consulted for Vancomycin  Dosing.  Plan: Vancomycin 1000 mg IV q24h  Check levels with tonights dose Monitor renal fx, cx results.  Weight: 161 lb 9.6 oz (73.3 kg)  Temp (24hrs), Avg:98.5 F (36.9 C), Min:97.8 F (36.6 C), Max:99.5 F (37.5 C)  Recent Labs  Lab 03/02/19 0500 03/03/19 0455 03/04/19 0427 03/05/19 0408 03/06/19 0305  WBC 2.5* 1.9* 2.0* 2.0* 2.4*  CREATININE 1.04* 1.38* 1.02* 1.19* 1.15*    Estimated Creatinine Clearance: 38.1 mL/min (A) (by C-G formula based on SCr of 1.15 mg/dL (H)).    Allergies  Allergen Reactions  . Amoxicillin-Pot Clavulanate Hives and Other (See Comments)    Has patient had a PCN reaction causing immediate rash, facial/tongue/throat swelling, SOB or lightheadedness with hypotension: UNKNOWN Has patient had a PCN reaction causing severe rash involving mucus membranes or skin necrosis: UNKNOWN Has patient had a PCN reaction that required hospitalization No Has patient had a PCN reaction occurring within the last 10 years: No If all of the above answers are "NO", then may proceed with Cephalosporin use.   . Byetta 10 Mcg Pen [Exenatide] Other (See Comments)    Unknown  . Ceclor [Cefaclor] Other (See Comments)    Unknown  . Celexa [Citalopram Hydrobromide] Other (See Comments)    Sick  . Ciprofloxacin Nausea Only and Other (See Comments)    Dizziness  . Claritin [Loratadine] Other (See Comments)    Hair fell out  . Erythromycin Other (See Comments)    REACTION: Upset stomach  . Flexeril [Cyclobenzaprine] Other (See Comments)    Nervous  . Fosamax [Alendronate] Other (See Comments)    Made pt sick  . Jardiance [Empagliflozin] Other (See Comments)    Vaginal infection, UTI  . Motrin [Ibuprofen] Nausea Only  . Naproxen Other (See Comments)    REACTION: severe nausea  . Norvasc [Amlodipine  Besylate] Other (See Comments)    Unknown  . Prednisone Other (See Comments)    REACTION: itchy rash  . Prozac [Fluoxetine Hcl] Other (See Comments)    Sick  . Sulfamethoxazole-Trimethoprim Nausea And Vomiting and Other (See Comments)    REACTION: Upset stomach    Thanks for allowing pharmacy to be a part of this patient's care.  Excell Seltzer, PharmD Clinical Pharmacist

## 2019-03-06 NOTE — Progress Notes (Signed)
RN consulted Pharmacist Lora regarding patient's need for Vanc level before RN administer med. Pharmacist instructed this RN to administer the dose and then call MD to find out if MD wants to continue pharmacy consult for Vanc.. RN spoke with Dr. Ranell Patrick and inquired about pharmacy consult and Vanc level and was agreeable to both. Debra Hopkins was informed.

## 2019-03-06 NOTE — Progress Notes (Addendum)
Physical Therapy Session Note  Patient Details  Name: Debra Hopkins MRN: [id number] Date of Birth: 28-Jan-1941  Today's Date: 03/06/2019 PT Individual Time:1505  - 1558, 53 min     Short Term Goals: Week 1:  PT Short Term Goal 1 (Week 1): pt will transfer supine<>sit with min A PT Short Term Goal 2 (Week 1): pt will transfer bed<>chair with LRAD mod A PT Short Term Goal 3 (Week 1): pt will perform car transfer with LRAD mod A  Skilled Therapeutic Interventions/Progress Updates:  Pt lying in bed, moaning in pain.  She reported pain 10/10 R residual limb and bil groin due to surgeries. Caryl Pina, LPN stated that pt had just had all meds allowed, 20 min previously.  Pt was confused, thinking she was at home in [place], despite PT orienting her several times during session.   Pt stated that she needed to urinate.  Pt sat up on R side of bed with mod assist and use of rail.  Mod assist slide board tranfer slightly downhill to drop arm BSC.  +2 for wt shifting L><R for PT to doff brief and pants.  Pt sat on BSC for extended period of time, and thought she had urinated, but was dry.  She performed peri care in front with set up.  Pt fatigued and upset.  Slide board transfer to return to bed, with towel over board due to bare skin.  Even with +2 assistance to return to bed, slightly downhill, pt unable to assist at all, and pt had to be laid back on bed as transfer was unsafe.  With bed features and use of LLE and +2 assist, pt scooted to [facility], avoiding shear on bottom.  Rolling L><R for donning fresh brief and pants all the way up, using bed features and min assist.  Pt assisted slightly with pelvic tilts in supine, as well.    neuromuscular re-education via multimodal cues for pelvic tilts pushing with LLE, L straight leg raises, cervical flexion.    At end of session, pt in bed with needs at hand and alarm set.       Therapy Documentation Precautions:  Precautions Precautions: Fall Precaution  Comments: R AKA NWB Restrictions Weight Bearing Restrictions: No RLE Weight Bearing: Non weight bearing       Therapy/Group: Individual Therapy  Clifton Kovacic 03/06/2019, 12:34 PM

## 2019-03-06 NOTE — Progress Notes (Signed)
Occupational Therapy Session Note  Patient Details  Name: Debra Hopkins MRN: [id number] Date of Birth: 05/21/1940  Today's Date: 03/06/2019 OT Individual Time: 1100-1155 OT Individual Time Calculation (min): 55 min    Short Term Goals: Week 1:  OT Short Term Goal 1 (Week 1): Pt will don pants with mod A OT Short Term Goal 2 (Week 1): Pt will complete transfer to [facility] with max +1 OT Short Term Goal 3 (Week 1): Pt will propel w/c 50 ft with min A to increase home accessibility Week 2:     Skilled Therapeutic Interventions/Progress Updates:    1:1 Pt required extra time to come to EOB and min to max A to transition to EOB. Sidelying to getting into propped position required the most amt of A. Pt able to sit EOB with min guard but with prolonged sitting pt with increasing back discomfort. Pt able to wash UB with minguard. Perform sit to stand with STEADY with slightly elevated surface with pillow at her left shin to protect it; pt able to come into standing with mod A +2 for safety. Pt able to stand long enough for pericare and donning a clean brief. From perched seat pt able to come into standing with supervision. Min guard. Pt unable to come into full upright erect standing position. Transferred with the STEDY to the w/c. Pt needed max A don paper pants and then came into standing again with mod A +2; total A to pull up pants. Pt taken to the sink to perform grooming at the sink with setup and extra time.   Pt left sitting up in the w/c.   Therapy Documentation Precautions:  Precautions Precautions: Fall Precaution Comments: R AKA NWB Restrictions Weight Bearing Restrictions: No RLE Weight Bearing: Non weight bearing General:   Vital Signs: Therapy Vitals Temp: 97.8 F (36.6 C) Pulse Rate: 60 Resp: 19 BP: 95/81 Patient Position (if appropriate): Sitting Oxygen Therapy SpO2: 100 % O2 Device: Room Air Pain: Pt reports residual limb was sore and not feeling herself. Allowed for  rest breaks as needed.  Therapy/Group: Individual Therapy  Willeen Cass [facility] 03/06/2019, 1:37 PM

## 2019-03-07 ENCOUNTER — Inpatient Hospital Stay (HOSPITAL_COMMUNITY): Payer: Medicare Other | Admitting: Physical Therapy

## 2019-03-07 ENCOUNTER — Inpatient Hospital Stay (HOSPITAL_COMMUNITY): Payer: Medicare Other | Admitting: Speech Pathology

## 2019-03-07 LAB — GLUCOSE, CAPILLARY
Glucose-Capillary: 148 mg/dL — ABNORMAL HIGH (ref 70–99)
Glucose-Capillary: 201 mg/dL — ABNORMAL HIGH (ref 70–99)
Glucose-Capillary: 135 mg/dL — ABNORMAL HIGH (ref 70–99)
Glucose-Capillary: 200 mg/dL — ABNORMAL HIGH (ref 70–99)

## 2019-03-07 LAB — VANCOMYCIN, TROUGH: Vancomycin Tr: 28 ug/mL (ref 15–20)

## 2019-03-07 LAB — VANCOMYCIN, PEAK: Vancomycin Pk: 42 ug/mL — ABNORMAL HIGH (ref 30–40)

## 2019-03-07 MED ORDER — HYDRALAZINE HCL 25 MG PO TABS
25.0000 mg | ORAL_TABLET | Freq: Every day | ORAL | Status: DC
  Administered 2019-03-08 – 2019-03-15 (×7): 25 mg via ORAL
  Filled 2019-03-07 (×8): qty 1

## 2019-03-07 MED ORDER — VANCOMYCIN HCL 1250 MG/250ML IV SOLN
1250.0000 mg | INTRAVENOUS | Status: DC
  Filled 2019-03-07: qty 250

## 2019-03-07 NOTE — Progress Notes (Signed)
Patient up to bedside commode and on to bedpan this morning. Patient unable to void despite multiple attempts and urge to do so. Patient cathed at 1400 for 1000cc-amber clear urine. Patient continues being intermittently confused. Noted disorientation to place and situation this afternoon. Incision to Rt AKA clean and dry. Rt groin dressing changed.

## 2019-03-07 NOTE — Progress Notes (Signed)
Physical Therapy Session Note  Patient Details  Name: Debra Hopkins MRN: [id number] Date of Birth: 25-Oct-1940  Today's Date: 03/07/2019 PT Individual Time: 1100-1156 PT Individual Time Calculation (min): 56 min   Short Term Goals: Week 1:  PT Short Term Goal 1 (Week 1): pt will transfer supine<>sit with min A PT Short Term Goal 2 (Week 1): pt will transfer bed<>chair with LRAD mod A PT Short Term Goal 3 (Week 1): pt will perform car transfer with LRAD mod A  Skilled Therapeutic Interventions/Progress Updates:   Received pt supine in bed, pt agreeable to therapy, and stated 11/10 pain in R residual limb. RN made aware and administered pain medication during session. Pt reported urge to use restroom upon start of session. Session focused on functional mobility/transfers, balance, LE strength, and improved endurance with activity. Pt performed bed mobility with HOB elevated with mod A. Pt able to scoot hips forward with CGA. Pt transferred sit<>stand +2 and Stedy. Pt required pillow in front of L shin for increased comfort with transfer. Pt transported to bedside commode on Stedy and performed stand<>sit onto commode with +2. Pt with extensive time on commode however unable to go and ultimately dry. Pt declined sitting in WC and requested to return to bed. Pt transferred sit<>stand +2 with Stedy and transferred back to bed. Pt with increased fatigue with continued sit<>stands but ultimately able to transfer sit<>stand to clear Stedy pads and return to bed. Pt transferred sit<>supine mod A. Therapist assisted with donning new brief with +2 assist, however pt able to roll with min A and use of bedrails. Pt agreeable to bed level exercises and performed the following exercises:  - LLE active assisted heel slides x10 - RLE SLR x 8 with verbal cues for technique - LLE SLR x 5; limited due to weakness - RLE supine and sidelying hip abduction x8  - RLE sidelying hip extension x8  Pt lethargic upon  returning to bed, falling asleep between exercises and when therapist left room to notify RN of pain status. Pt appears fearful of movement and required encouragement to complete exercises. Pt reported difficulty with exercises, but stated she is motivated to get stronger. Therapist assisted with repositioning and pillow placement in L sidelying for pressure relief. Concluded session with pt sidelying in bed, needs within reach, and bed alarm on.   Therapy Documentation Precautions:  Precautions Precautions: Fall Precaution Comments: R AKA NWB Restrictions Weight Bearing Restrictions: No RLE Weight Bearing: Non weight bearing   Therapy/Group: Individual Therapy Alfonse Alpers PT, DPT   03/07/2019, 7:34 AM

## 2019-03-07 NOTE — Progress Notes (Signed)
Speech Language Pathology Daily Session Note  Patient Details  Name: Debra Hopkins MRN: [id number] Date of Birth: 1940/11/18  Today's Date: 03/07/2019 SLP Individual Time: 1410-1500 SLP Individual Time Calculation (min): 50 min  Short Term Goals: Week 1: SLP Short Term Goal 1 (Week 1): Patient will demonstrate functional problem solving for basic and familair tasks with Min A verbal cues. SLP Short Term Goal 2 (Week 1): Patient will recall new, daily information with use of external aids with Mod A verbal and visual cues. SLP Short Term Goal 3 (Week 1): Patient will self-monitor and correct errors during functional tasks with Min A verbal cues. SLP Short Term Goal 4 (Week 1): Patient will demonstrate sustained attention to tasks for ~10 minutes with Min A verbal cues for redirection.  Skilled Therapeutic Interventions:  Pt was seen for skilled ST targeting cognitive goals.  SLP attempted to administer portions of the ALFA standardized cognitive assessment; however, pt reported increased pain when repositioned to an upright seated position and requested that head of bed be laid flat.  As a result, task was abandoned and SLP introduced the use of a calendar and memory notebook to facilitate recall of daily information as pt appears to have fluctuating mentation and diminished recall of her hospital course and therapy schedule.  Pt was able to return demonstration of both aids with mod assist verbal cues for sustained attention to task.  Pt was left in bed with bed alarm set and call bell within reach.  Continue per current plan of care.    Pain Pain Assessment Pain Scale: 0-10 Pain Score: 0-No pain(no pain in supine, grimacing when attempting to reposition to a more upright position) Faces Pain Scale: Hurts a little bit Pain Intervention(s): Repositioned  Therapy/Group: Individual Therapy  Debra Hopkins, Selinda Orion 03/07/2019, 4:16 PM

## 2019-03-07 NOTE — Progress Notes (Signed)
Pharmacy Antibiotic Note  Debra Hopkins is a 78 y.o. female admitted on 03/02/2019 with wound infection.  Pharmacy has been consulted for vancomycin dosing.  Vanc peak 42, trough 28 >> AUC above goal at 837.  Plan: Change vancomycin to 1250mg  IV Q48H. Goal AUC 400-550.  Expected AUC 520.  Weight: 161 lb 9.6 oz (73.3 kg)  Temp (24hrs), Avg:98.2 F (36.8 C), Min:97.8 F (36.6 C), Max:98.5 F (36.9 C)  Recent Labs  Lab 03/02/19 0500 03/03/19 0455 03/04/19 0427 03/05/19 0408 03/06/19 0305 03/07/19 0128 03/07/19 2126  WBC 2.5* 1.9* 2.0* 2.0* 2.4*  --   --   CREATININE 1.04* 1.38* 1.02* 1.19* 1.15*  --   --   VANCOTROUGH  --   --   --   --   --   --  28*  VANCOPEAK  --   --   --   --   --  42*  --     Estimated Creatinine Clearance: 38.1 mL/min (A) (by C-G formula based on SCr of 1.15 mg/dL (H)).    Allergies  Allergen Reactions  . Amoxicillin-Pot Clavulanate Hives and Other (See Comments)    Has patient had a PCN reaction causing immediate rash, facial/tongue/throat swelling, SOB or lightheadedness with hypotension: UNKNOWN Has patient had a PCN reaction causing severe rash involving mucus membranes or skin necrosis: UNKNOWN Has patient had a PCN reaction that required hospitalization No Has patient had a PCN reaction occurring within the last 10 years: No If all of the above answers are "NO", then may proceed with Cephalosporin use.   . Byetta 10 Mcg Pen [Exenatide] Other (See Comments)    Unknown  . Ceclor [Cefaclor] Other (See Comments)    Unknown  . Celexa [Citalopram Hydrobromide] Other (See Comments)    Sick  . Ciprofloxacin Nausea Only and Other (See Comments)    Dizziness  . Claritin [Loratadine] Other (See Comments)    Hair fell out  . Erythromycin Other (See Comments)    REACTION: Upset stomach  . Flexeril [Cyclobenzaprine] Other (See Comments)    Nervous  . Fosamax [Alendronate] Other (See Comments)    Made pt sick  . Jardiance [Empagliflozin] Other (See  Comments)    Vaginal infection, UTI  . Motrin [Ibuprofen] Nausea Only  . Naproxen Other (See Comments)    REACTION: severe nausea  . Norvasc [Amlodipine Besylate] Other (See Comments)    Unknown  . Prednisone Other (See Comments)    REACTION: itchy rash  . Prozac [Fluoxetine Hcl] Other (See Comments)    Sick  . Sulfamethoxazole-Trimethoprim Nausea And Vomiting and Other (See Comments)    REACTION: Upset stomach     Thank you for allowing pharmacy to be a part of this patient's care.  Wynona Neat, PharmD, BCPS  03/07/2019 11:18 PM

## 2019-03-07 NOTE — Progress Notes (Signed)
Occupational Therapy Session Note  Patient Details  Name: Debra Hopkins MRN: [id number] Date of Birth: 26-Sep-1940  Today's Date: 03/07/2019 OT Individual Time: [id number] OT Individual Time Calculation (min): 60 min    Short Term Goals: Week 1:  OT Short Term Goal 1 (Week 1): Pt will don pants with mod A OT Short Term Goal 2 (Week 1): Pt will complete transfer to [facility] with max +1 OT Short Term Goal 3 (Week 1): Pt will propel w/c 50 ft with min A to increase home accessibility  Skilled Therapeutic Interventions/Progress Updates: patient extremely difficult to wake up and arouse.   Very groggy and required much time to engage in session.   Otherwise, she particpated as follows:    Drowsy and sleeping on and off during session.   She stated she could not sit edge of bed due to pain in residual limb and extreme sleepiness.  Did not have clothing (doffed gown and donned new gown with extra time, cues for sequencing and topographical orientation and  Miin physical A)  (Supine in bed with head of bed elevated) UB bathing with moderate assistance as she continually fell back to sleep.   Min A for donning gown.  Lower body bathing=total A x2.   Patient very weak and c/o pain and with difficulty with any bed mobility.   Required total Ax2 for lateral rolls to washing periarea and checking brief.  Bed positioning= total A x2.  Patient stated she was too sleepy to get out of bed this am.  Call bell and bed alrm engaged at the end of the session.  jContinue Plan of CAre     Therapy Documentation Precautions:  Precautions Precautions: Fall Precaution Comments: R AKA NWB Restrictions Weight Bearing Restrictions: No RLE Weight Bearing: Non weight bearing   Pain:4/10 in residual surgical leg .   Constant.   she refused offer for pain meds or ice   Therapy/Group: Individual Therapy  Alfredia Ferguson [facility] 03/07/2019, 3:22 PM

## 2019-03-07 NOTE — Progress Notes (Signed)
RN received a critical value for Vanc trough level (28). Pharmacist Veronda informed. MD called awaiting to call back.

## 2019-03-07 NOTE — Progress Notes (Signed)
78 year old female s/p complex revascularization right lower extremity and then required R AKA by Dr. Donnetta Hutching on 02/22/19.  Appears comfortable in rehab.  Her right above-knee amputation is healing nicely with staples.  The right vein harvest site was a previous area of concern but looks okay.  Right groin with continued fat necrosis and serous drainage.  Likely require groin debridement and possible VAC placement this week.  I will discuss with Dr. [name].  Maryruth Eve, MD [facility] [place] Office: [phone] Pager: [phone]

## 2019-03-07 NOTE — Progress Notes (Signed)
[place] PHYSICAL MEDICINE & REHABILITATION PROGRESS NOTE   Subjective/Complaints: Much less lethargic this morning, though reports she is sleepy. Pain better controlled. BP low this morning; hydralazine held/ Appreciate vascular surgery recs.  ROS- pt denies CP, SOB, Abd pain, N/V/D/constipation, HA  Objective:   No results found. Recent Labs    03/05/19 0408 03/06/19 0305  WBC 2.0* 2.4*  HGB 7.4* 7.8*  HCT 23.1* 24.0*  PLT 79* 96*   Recent Labs    03/05/19 0408 03/06/19 0305  NA 140 141  K 4.0 3.9  CL 106 106  CO2 26 26  GLUCOSE 150* 144*  BUN 38* 36*  CREATININE 1.19* 1.15*  CALCIUM 8.9 9.0    Intake/Output Summary (Last 24 hours) at 03/07/2019 1255 Last data filed at 03/07/2019 1035 Gross per 24 hour  Intake 624 ml  Output --  Net 624 ml     Physical Exam: Vital Signs Blood pressure (!) 110/40, pulse 70, temperature 98.5 F (36.9 C), temperature source Oral, resp. rate 16, weight 73.3 kg, SpO2 97 %.  General: lying in bed, sleepy, NAD Head: Normocephalic and atraumatic.  Mouth/Throat: Oropharynx is clear and moist. No oropharyngeal exudate.  Dentures (+)  Eyes: Conjunctivae and EOM are normal.  Neck: No tracheal deviation present. No thyromegaly present.  Cardiovascular:  RRR  Respiratory: Effort normal.   CTA B/L- no W/R/R  GI: She exhibits no distension.  Soft, NT, ND, (+) BS hypoactive  Genitourinary: Genitourinary Comments: purewick in place- medium amber urine in container  Musculoskeletal:  Cervical back: Normal range of motion and neck supple.  Comments: Right AKA with tenderness and edema UEs 5-5/ B/L- in Deltoid, biceps, triceps, WE, grip and finger abd LLE- HF 2+/5 , KE ~3/5- couldn't tolerate any touch to LLE; DF and PF 4+/5 R HF- couldn't lift off bed  Neurological: She is alert.  Skin:  R groin open and draining staples out- mild localized erythema; L groin looks better than R; R AKA stable- staples in tact; starting to pull in  dog ears; no drainage seen Psychiatric: Sleepy  Assessment/Plan: 1. 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  Physiatrist is providing close team supervision and 24 hour management of active medical problems listed below.  Physiatrist and rehab team continue to assess barriers to discharge/monitor patient progress toward functional and medical goals  Care Tool:  Bathing    Body parts bathed by patient: Right arm, Left arm, Chest, Abdomen, Face, Right upper leg, Left upper leg   Body parts bathed by helper: Front perineal area, Buttocks, Left lower leg Body parts n/a: Right lower leg(AKA)   Bathing assist Assist Level: Moderate Assistance - Patient 50 - 74%     Upper Body Dressing/Undressing Upper body dressing   What is the patient wearing?: Hospital gown only    Upper body assist Assist Level: Moderate Assistance - Patient 50 - 74%    Lower Body Dressing/Undressing Lower body dressing      What is the patient wearing?: Incontinence brief, Hospital gown only     Lower body assist Assist for lower body dressing: Moderate Assistance - Patient 50 - 74%     Toileting Toileting    Toileting assist Assist for toileting: 2 Helpers     Transfers Chair/bed transfer  Transfers assist  Chair/bed transfer activity did not occur: Safety/medical concerns(Pt with increased pain and LLE weakness limiting  participation in therapy)  Chair/bed transfer assist level: 2 Helpers     Locomotion Ambulation   Ambulation assist   Ambulation activity did not occur: Safety/medical concerns(Pt with increased pain and LLE weakness limiting participation in therapy)          Walk 10 feet activity   Assist  Walk 10 feet activity did not occur: Safety/medical concerns(Pt with increased pain and LLE weakness limiting participation in  therapy)        Walk 50 feet activity   Assist Walk 50 feet with 2 turns activity did not occur: Safety/medical concerns(Pt with increased pain and LLE weakness limiting participation in therapy)         Walk 150 feet activity   Assist Walk 150 feet activity did not occur: Safety/medical concerns(Pt with increased pain and LLE weakness limiting participation in therapy)         Walk 10 feet on uneven surface  activity   Assist Walk 10 feet on uneven surfaces activity did not occur: Safety/medical concerns(Pt with increased pain and LLE weakness limiting participation in therapy)         Wheelchair     Assist Will patient use wheelchair at discharge?: Yes Type of Wheelchair: Manual Wheelchair activity did not occur: Safety/medical concerns(Pt with increased pain and LLE weakness limiting participation in therapy)         Wheelchair 50 feet with 2 turns activity    Assist    Wheelchair 50 feet with 2 turns activity did not occur: Safety/medical concerns(Pt with increased pain and LLE weakness limiting participation in therapy)       Wheelchair 150 feet activity     Assist  Wheelchair 150 feet activity did not occur: Safety/medical concerns(Pt with increased pain and LLE weakness limiting participation in therapy)       Blood pressure (!) 110/40, pulse 70, temperature 98.5 F (36.9 C), temperature source Oral, resp. rate 16, weight 73.3 kg, SpO2 97 %.  1. 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 Vancomycin resumed for wound coverage 03/01/2019  -patient may shower if R AKA wrapped  -ELOS/Goals: Min A/16-19 days.  Admit to CIR  2. Antithrombotics:  -DVT/anticoagulation: Subcutaneous heparin- suggest Doppler of LLE due to severe pain of LLE   12/23- Dopplers of LLE (-) -antiplatelet therapy: Aspirin 81 mg daily  3. Pain Management: Lyrica 25 mg 3 times daily as  needed, oxycodone as needed  -likely will need more Lyrica- go back to home dose; also suggest Skelaxin for muscle relaxant since allergic to Flexeril and sedated easily; lidocaine patches 2 of them starting in AM on L calf   12/23- ordered lidoderm patches for L calf; added skelaxin for muscle pain prn; and made lyrica scheduled- probably need to increase in 2-3 days.  12/25: Added Lidocaine patch for coccyx pain  12/26: will schedule tylenol for better pain control. Do not want to increase her sedating pain meds as she is already fatigued.   12/27: Pain better controlled with scheduled Tylenol. 4. Mood: Trazodone 100 mg nightly  -antipsychotic agents: N/A  5. Neuropsych: This patient is capable of making decisions on her own behalf?- multiple word substitutions on exam- will need SLP.  6. Skin/Wound Care: Routine skin checks  7. Fluids/Electrolytes/Nutrition: Routine in and outs. CMP ordered.  8. Acute blood loss anemia. CBC stable on 12/24, with decreased Hgb on 12/25, will repeat tomorrow.    12/26: Hgb with increase.  9. History of TIA. Continue aspirin  10. Hypertension. Clonidine 0.2 mg nightly, hydralazine 25 mg nightly, Cozaar 100 mg daily, Demadex 20 mg daily, Procardia XL 30 mg daily. Monitor with increased mobility.  12/23- BP slightly increased 144/90s- will monitor for trends   12/24: BP wnl.   12/25: BP increased to 148/52 this morning; has been up to [id number] systolic in last few days. Will change Hydralazine to 25mg  BID.    [id number]: Systolic [id number] this morning; Hydralazine held. Will change to 25mg  HS 11. Hypothyroidism. Synthroid  12. Diabetes mellitus peripheral neuropathy. Hemoglobin A1c 5.9. Lantus insulin 15 units nightly. Check blood sugars before meals and at bedtime. Monitor with increased mobility.  CBG (last 3)  Recent Labs    03/06/19 2113 03/07/19 0623 03/07/19 1145  GLUCAP 149* 148* 201*    12/23- labile slightly- but overall better 13. Hyperlipidemia. Zocor  14.  Constipation. Laxative assistance  15. GERD. Protonix  16. Thrombocytopenia-   12/23- plts down to 69k- has been dropping- will ask team to address this along with leukopenia. 17. Leukopenia  12/23- WBC down to 1.9 18. Anemia-   12/23- down to 8.2 from 9.3 AND Cr increased- will ask team to call Hematology in AM  12/24-12/26: Hgb stable 19. AKI-   12/23- Cr 1.38 up from 1.04- BUN not elevated- Likely due to IV Vanc  12/24: Cr decreased to 1.02  12/25: Cr increased to 1.19, will repeat tomorrow, encouraged fluid intake.  12/26: Cr stable 20. Infection?- Vanc restarted due to increased drainage from groin-WBC dropped even more- will need to recheck labs in AM- if worse, needs IM vs ID consult to see if needs IV Vanc.   21. Hypokalemia: Will add Klor supplement and repeat BMP tomorrow.   12/25: K+ normalized. Will discontinue supplement.  22. Disposition: appreciate vascular surgery recs. Right groin with continued fat necrosis and serous drainage.  Likely require groin debridement and possible VAC placement this week.   LOS: 5 days A FACE TO FACE EVALUATION WAS PERFORMED  Clide Deutscher Hamid Brookens 03/07/2019, 12:55 PM

## 2019-03-07 NOTE — Plan of Care (Signed)
  Problem: Consults Goal: RH LIMB LOSS PATIENT EDUCATION Description: Description: See Patient Education module for eduction specifics. Outcome: Progressing Goal: Skin Care Protocol Initiated - if Braden Score 18 or less Description: If consults are not indicated, leave blank or document N/A Outcome: Progressing Goal: Nutrition Consult-if indicated Outcome: Progressing Goal: Diabetes Guidelines if Diabetic/Glucose > 140 Description: If diabetic or lab glucose is > 140 mg/dl - Initiate Diabetes/Hyperglycemia Guidelines & Document Interventions  Outcome: Progressing   Problem: RH BOWEL ELIMINATION Goal: RH STG MANAGE BOWEL WITH ASSISTANCE Description: STG Manage Bowel with moderate Assistance. Outcome: Progressing   Problem: RH BLADDER ELIMINATION Goal: RH STG MANAGE BLADDER WITH ASSISTANCE Description: STG Manage Bladder With moderate Assistance Outcome: Progressing   Problem: RH SKIN INTEGRITY Goal: RH STG ABLE TO PERFORM INCISION/WOUND CARE W/ASSISTANCE Description: STG Able To Perform Incision/Wound Care With moderate Assistance. Outcome: Progressing   Problem: RH SAFETY Goal: RH STG ADHERE TO SAFETY PRECAUTIONS W/ASSISTANCE/DEVICE Description: STG Adhere to Safety Precautions With moderate Assistance/Device. Outcome: Progressing Goal: RH STG DECREASED RISK OF FALL WITH ASSISTANCE Description: STG Decreased Risk of Fall With moderate Assistance. Outcome: Progressing   Problem: RH PAIN MANAGEMENT Goal: RH STG PAIN MANAGED AT OR BELOW PT'S PAIN GOAL Description: Patient's pain level will be <3 by discharge. Outcome: Progressing   Problem: RH KNOWLEDGE DEFICIT LIMB LOSS Goal: RH STG INCREASE KNOWLEDGE OF SELF CARE AFTER LIMB LOSS Description: Patient will verbalize how to promote good circulation in stump and leg. Outcome: Progressing

## 2019-03-08 ENCOUNTER — Inpatient Hospital Stay (HOSPITAL_COMMUNITY): Payer: Medicare Other

## 2019-03-08 ENCOUNTER — Inpatient Hospital Stay (HOSPITAL_COMMUNITY): Payer: Medicare Other | Admitting: Physical Therapy

## 2019-03-08 ENCOUNTER — Inpatient Hospital Stay (HOSPITAL_COMMUNITY): Payer: Medicare Other | Admitting: Occupational Therapy

## 2019-03-08 DIAGNOSIS — R7309 Other abnormal glucose: Secondary | ICD-10-CM

## 2019-03-08 DIAGNOSIS — I739 Peripheral vascular disease, unspecified: Secondary | ICD-10-CM

## 2019-03-08 DIAGNOSIS — D72819 Decreased white blood cell count, unspecified: Secondary | ICD-10-CM

## 2019-03-08 DIAGNOSIS — E1142 Type 2 diabetes mellitus with diabetic polyneuropathy: Secondary | ICD-10-CM

## 2019-03-08 DIAGNOSIS — D62 Acute posthemorrhagic anemia: Secondary | ICD-10-CM

## 2019-03-08 DIAGNOSIS — N179 Acute kidney failure, unspecified: Secondary | ICD-10-CM

## 2019-03-08 DIAGNOSIS — G479 Sleep disorder, unspecified: Secondary | ICD-10-CM

## 2019-03-08 DIAGNOSIS — Z89511 Acquired absence of right leg below knee: Secondary | ICD-10-CM

## 2019-03-08 DIAGNOSIS — N1831 Chronic kidney disease, stage 3a: Secondary | ICD-10-CM

## 2019-03-08 DIAGNOSIS — E1121 Type 2 diabetes mellitus with diabetic nephropathy: Secondary | ICD-10-CM

## 2019-03-08 DIAGNOSIS — I10 Essential (primary) hypertension: Secondary | ICD-10-CM

## 2019-03-08 DIAGNOSIS — D619 Aplastic anemia, unspecified: Secondary | ICD-10-CM

## 2019-03-08 LAB — GLUCOSE, CAPILLARY
Glucose-Capillary: 134 mg/dL — ABNORMAL HIGH (ref 70–99)
Glucose-Capillary: 137 mg/dL — ABNORMAL HIGH (ref 70–99)
Glucose-Capillary: 159 mg/dL — ABNORMAL HIGH (ref 70–99)
Glucose-Capillary: 157 mg/dL — ABNORMAL HIGH (ref 70–99)

## 2019-03-08 LAB — CBC WITH DIFFERENTIAL/PLATELET
Abs Immature Granulocytes: 0.01 10*3/uL (ref 0.00–0.07)
Basophils Absolute: 0 10*3/uL (ref 0.0–0.1)
Basophils Relative: 0 %
Eosinophils Absolute: 0 10*3/uL (ref 0.0–0.5)
Eosinophils Relative: 1 %
HCT: 22.6 % — ABNORMAL LOW (ref 36.0–46.0)
Hemoglobin: 7.1 g/dL — ABNORMAL LOW (ref 12.0–15.0)
Immature Granulocytes: 1 %
Lymphocytes Relative: 41 %
Lymphs Abs: 0.8 10*3/uL (ref 0.7–4.0)
MCH: 30.2 pg (ref 26.0–34.0)
MCHC: 31.4 g/dL (ref 30.0–36.0)
MCV: 96.2 fL (ref 80.0–100.0)
Monocytes Absolute: 0.4 10*3/uL (ref 0.1–1.0)
Monocytes Relative: 21 %
Neutro Abs: 0.7 10*3/uL — ABNORMAL LOW (ref 1.7–7.7)
Neutrophils Relative %: 36 %
Platelets: 113 10*3/uL — ABNORMAL LOW (ref 150–400)
RBC: 2.35 MIL/uL — ABNORMAL LOW (ref 3.87–5.11)
RDW: 13.8 % (ref 11.5–15.5)
WBC: 1.9 10*3/uL — ABNORMAL LOW (ref 4.0–10.5)
nRBC: 0 % (ref 0.0–0.2)

## 2019-03-08 LAB — BASIC METABOLIC PANEL
Anion gap: 9 (ref 5–15)
BUN: 46 mg/dL — ABNORMAL HIGH (ref 8–23)
CO2: 28 mmol/L (ref 22–32)
Calcium: 9.1 mg/dL (ref 8.9–10.3)
Chloride: 104 mmol/L (ref 98–111)
Creatinine, Ser: 1.39 mg/dL — ABNORMAL HIGH (ref 0.44–1.00)
GFR calc Af Amer: 42 mL/min — ABNORMAL LOW (ref >60)
GFR calc non Af Amer: 36 mL/min — ABNORMAL LOW (ref >60)
Glucose, Bld: 161 mg/dL — ABNORMAL HIGH (ref 70–99)
Potassium: 4.4 mmol/L (ref 3.5–5.1)
Sodium: 141 mmol/L (ref 135–145)

## 2019-03-08 NOTE — Progress Notes (Signed)
Notified on call Linna Hoff Anqiulli, PA) hold 6 am Heparin dose prior to surgery

## 2019-03-08 NOTE — Progress Notes (Signed)
   I have evaluated the patient and she needs right groin debridement in the OR tomorrow. Will have patient n.p.o. past midnight.  Likely will require wound VAC placement but can return to rehab after procedure.  Tyann Niehaus C. [name], MD [facility] Office: [phone] Pager: [phone]

## 2019-03-08 NOTE — Plan of Care (Signed)
  Problem: Consults Goal: RH LIMB LOSS PATIENT EDUCATION Description: Description: See Patient Education module for eduction specifics. 03/08/2019 1147 by Rodolph Bong, LPN Outcome: Progressing   Problem: Consults Goal: Skin Care Protocol Initiated - if Braden Score 18 or less Description: If consults are not indicated, leave blank or document N/A 03/08/2019 1147 by Rodolph Bong, LPN Outcome: Progressing   Problem: Consults Goal: Nutrition Consult-if indicated 03/08/2019 1147 by Rodolph Bong, LPN Outcome: Progressing   Problem: Consults Goal: Diabetes Guidelines if Diabetic/Glucose > 140 Description: If diabetic or lab glucose is > 140 mg/dl - Initiate Diabetes/Hyperglycemia Guidelines & Document Interventions  03/08/2019 1147 by Rodolph Bong, LPN Outcome: Progressing   Problem: RH BOWEL ELIMINATION Goal: RH STG MANAGE BOWEL WITH ASSISTANCE Description: STG Manage Bowel with moderate Assistance. 03/08/2019 1147 by Rodolph Bong, LPN Outcome: Progressing   Problem: RH BLADDER ELIMINATION Goal: RH STG MANAGE BLADDER WITH ASSISTANCE Description: STG Manage Bladder With moderate Assistance 03/08/2019 1147 by Rodolph Bong, LPN Outcome: Progressing   Problem: RH SKIN INTEGRITY Goal: RH STG ABLE TO PERFORM INCISION/WOUND CARE W/ASSISTANCE Description: STG Able To Perform Incision/Wound Care With moderate Assistance. 03/08/2019 1147 by Rodolph Bong, LPN Outcome: Progressing   Problem: RH SAFETY Goal: RH STG ADHERE TO SAFETY PRECAUTIONS W/ASSISTANCE/DEVICE Description: STG Adhere to Safety Precautions With moderate Assistance/Device. 03/08/2019 1147 by Rodolph Bong, LPN Outcome: Progressing   Problem: RH SAFETY Goal: RH STG DECREASED RISK OF FALL WITH ASSISTANCE Description: STG Decreased Risk of Fall With moderate Assistance. 03/08/2019 1147 by Rodolph Bong, LPN Outcome: Progressing   Problem: RH PAIN MANAGEMENT Goal: RH STG PAIN MANAGED AT OR BELOW PT'S  PAIN GOAL Description: Patient's pain level will be <3 by discharge. 03/08/2019 1147 by Rodolph Bong, LPN Outcome: Progressing   Problem: RH KNOWLEDGE DEFICIT LIMB LOSS Goal: RH STG INCREASE KNOWLEDGE OF SELF CARE AFTER LIMB LOSS Description: Patient will verbalize how to promote good circulation in stump and leg. 03/08/2019 1147 by Rodolph Bong, LPN Outcome: Progressing

## 2019-03-08 NOTE — Consult Note (Signed)
Neuropsychological Consultation   Patient:   Debra Hopkins   DOB:   06-08-1940  MR Number:  [id number]  Location:  [facility] [address] [facility] [address] [place] [id number] [place] [id number] Dept: [place]: [phone]           Date of Service:   03/08/2019  Start Time:   10:15 AM End Time:   11:15 AM  Provider/Observer:  Ilean Skill, Psy.D.       Clinical Neuropsychologist       Billing Code/Service: [id number]  Chief Complaint:    Debra Hopkins. Light is a 78 year old female with history of CAD, diabetes, TIA, left CEA, fibromyalgia, macular degeneration, peripheral vascular disease with multiple revascularization procedures and maintained on Asprin.  Presented on 02/15/2019 with chronic RLE ischemic changes and toe ulceration.  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  Continued complications of pain due to profound ischemic changes in foot and felt to be nonviable and underwent right AKD on 02/22/2019.  Admitted for CIR program.    Reason for Service:  Patient was referred for neuropsychological consultation due to coping and adjustment issues.  Below is the HPI for the current admission.  HPI: Debra Hopkins. Wakley is a 78 year old right-handed female history of CAD, diabetes mellitus, TIA, left CEA 2008, fibromyalgia, macular degeneration, peripheral vascular disease with multiple revascularization procedures and maintained on low-dose aspirin. History taken from chart review and patient. Presented on 02/15/2019 with chronic RLE ischemic changes and toe ulceration. Recent aortogram showed aortic and iliac segments heavily calcified. 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 [name] on 02/15/2019. Noted postoperatively mottling of the right foot started on heparin drip. Underwent right common femoral enterectomy with open repair of  left common femoral pseudoaneurysm on 02/16/2019. 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 Donnetta Hutching. Hospital course further complicated by acute blood loss anemia patient was transfused 2 units with latest hemoglobin 9.5. Subcutaneous heparin was initiated for DVT prophylaxis 02/23/2019 as well as remaining on low-dose aspirin. Vancomycin resumed 03/01/2023 wound coverage. Therapy evaluations completed and patient was admitted for a comprehensive rehab program.   Current Status:  Patient was somewhat confused today.  While she had clear understanding of medical issues and events, she stated that she had ordered food for breakfast and someone that she knew from [place] brought it to her from [place] in [place].  She was quite pleased that everything was being done to make her feel at home here in the hospital.  She did say it was somewhat strange that the hospital is able to achieve this so quickly.  This is likely an effect of some of the medication that she is taking for pain and anxiety and it is being addressed medically.    Behavioral Observation: [name]  presents as a 78 y.o.-year-old Right Caucasian Female who appeared her stated age. her dress was Appropriate and she was Well Groomed and her manners were Appropriate to the situation.  her participation was indicative of Appropriate, Inattentive and Redirectable behaviors.  There were any physical disabilities noted.  she displayed an appropriate level of cooperation and motivation.     Interactions:    Active Appropriate, Inattentive and Redirectable  Attention:   abnormal and attention  span appeared shorter than expected for age  Memory:   abnormal; remote memory intact, recent memory impaired  Visuo-spatial:  not examined  Speech (Volume):  normal  Speech:   normal; normal  Thought Process:  Coherent and  Tangential  Though Content:  WNL; Patient did have some confusion/"delusion"?  Orientation:   person, place, time/date and situation  Judgment:   Fair  Planning:   Fair  Affect:    Appropriate  Mood:    Anxious  Insight:   Fair  Intelligence:   normal   Medical History:   Past Medical History:  Diagnosis Date  . Arthritis   . CAD (coronary artery disease)   . Cancer ([place])   . Carotid artery occlusion   . Cataracts, bilateral   . DDD (degenerative disc disease)   . Diabetes mellitus    Type 2  . Diverticulitis   . Dyspnea   . Fibromyalgia   . GERD (gastroesophageal reflux disease)   . Heart murmur   . Hemorrhoids   . History of shingles   . Impaired hearing   . Macular degeneration   . Other and unspecified hyperlipidemia   . PVD (peripheral vascular disease) ([place])   . TIA (transient ischemic attack)   . Unspecified essential hypertension   . Varicose veins    Psychiatric History:  No prior psychiatric history  Family Med/Psych History:  Family History  Problem Relation Age of Onset  . Stroke Mother   . Hypertension Mother   . Stroke Father   . Hypertension Father   . Diabetes Father   . Coronary artery disease Father   . Heart disease Father   . Diabetes Sister   . Hypertension Sister   . Lung cancer Brother   . Sinusitis Brother   . Healthy Brother   . Diabetes Son   . Hypertension Son   . Coronary artery disease Other     Impression/DX:  Debra Hopkins is a 78 year old female with history of CAD, diabetes, TIA, left CEA, fibromyalgia, macular degeneration, peripheral vascular disease with multiple revascularization procedures and maintained on Asprin.  Presented on 02/15/2019 with chronic RLE ischemic changes and toe ulceration.  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  Continued complications of pain due to profound ischemic changes in foot and felt to be nonviable and underwent right AKD on 02/22/2019.  Admitted for CIR  program.   Disposition/Plan:  Will follow-up later in week is she continues with confusion after med adjustment.  Diagnosis:    Right AKA         Electronically Signed   _______________________ Ilean Skill, Psy.D.

## 2019-03-08 NOTE — Progress Notes (Signed)
Patient is alert but states she sees bugs crawling on the side of the wall,,informed there were no bugs, patient states she is not crazy. Support provided.  Marland Kitchen

## 2019-03-08 NOTE — Progress Notes (Signed)
Occupational Therapy Session Note  Patient Details  Name: Debra Hopkins MRN: [id number] Date of Birth: [birth year]  Today's Date: 03/08/2019 OT Individual Time: 6619-6940 OT Individual Time Calculation (min): 57 min   Short Term Goals: Week 1:  OT Short Term Goal 1 (Week 1): Pt will don pants with mod A OT Short Term Goal 2 (Week 1): Pt will complete transfer to [facility] with max +1 OT Short Term Goal 3 (Week 1): Pt will propel w/c 50 ft with min A to increase home accessibility  Skilled Therapeutic Interventions/Progress Updates:    Pt greeted seated in wc and agreeable to OT treatment session focused on self-care retraining. UB bathing/dressing completed wc level at the sink with set-up A. OT washed pt's hair using wc hair washing trough with pt holding string while OT washed hair. UB strength/endurance with hair drying task. Worked on sit<>stand using Stedy with pillow placed in front of shin for comfort. Max A +2 to come to standing in [place] with verbal cues and facilitation for full hip/trunk extension. Pt then with lateral LOB to the R requiring max A to correct. Pt able to stand from perched seat 3 more x with min-mod A +2. Pt tolerated stanidng for only 15-20 second intervals while OT washed buttocks and pt washed peri-area perched on seat. Pt reached max fatigue and requested to return to bed. Stedy used to transfer pt back to EOB. OT notified nursing of wounds on pt's bottom and to check dressing on wound above R groin. Pt returned to bed with mod A to lift LEs. Pt left semi-reclined in bed with bed alarm on, call bell in reach, and needs met.   Therapy Documentation Precautions:  Precautions Precautions: Fall Precaution Comments: R AKA NWB Restrictions Weight Bearing Restrictions: No RLE Weight Bearing: Non weight bearing Pain: Pain Assessment Pain Scale: 0-10 Pain Score: 0-No pain  Therapy/Group: Individual Therapy  Valma Cava 03/08/2019, 11:20 AM

## 2019-03-08 NOTE — Progress Notes (Signed)
Speech Language Pathology Daily Session Note  Patient Details  Name: Debra Hopkins MRN: [id number] Date of Birth: April 22, 1940  Today's Date: 03/08/2019 SLP Individual Time: 1430-1450 SLP Individual Time Calculation (min): 20 min  Short Term Goals: Week 1: SLP Short Term Goal 1 (Week 1): Patient will demonstrate functional problem solving for basic and familair tasks with Min A verbal cues. SLP Short Term Goal 2 (Week 1): Patient will recall new, daily information with use of external aids with Mod A verbal and visual cues. SLP Short Term Goal 3 (Week 1): Patient will self-monitor and correct errors during functional tasks with Min A verbal cues. SLP Short Term Goal 4 (Week 1): Patient will demonstrate sustained attention to tasks for ~10 minutes with Min A verbal cues for redirection.  Skilled Therapeutic Interventions: Skilled ST services focused on cognitive skills. Pt required min A verbal cues for orientation of day and date with visual aids posted in room. Pt require mod A verbal cues to recall today's therapy events with memory notebook. SLP facilitated semi-complex problem solving in construction task in [place], pt was unable to return demonstration. SLP also facilitated basic problem solving skills in money management counting change, pt began task requiring max A verbal cues to form a $1.00 with change and then we impacted by pain. Pt missed 10 minutes of treatment due to pain and requested to end session early. Pt was left in room with call bell within reach and chair alarm set. ST recommends to continue skilled ST services.      Pain Pain Assessment Faces Pain Scale: Hurts whole lot Pain Type: Acute pain Pain Onset: Sudden Pain Intervention(s): Repositioned  Therapy/Group: Individual Therapy  Kwadwo Taras  [facility] 03/08/2019, 3:00 PM

## 2019-03-08 NOTE — Progress Notes (Signed)
[place] PHYSICAL MEDICINE & REHABILITATION PROGRESS NOTE   Subjective/Complaints: Patient seen laying in bed this morning.  She is lethargic and briefly responds.  Overnight patient had critical vancomycin level.  She also received pain medication and sleepy.  ROS: Patient denies CP, shortness of breath, nausea, vomiting or diarrhea.  Objective:   No results found. Recent Labs    03/06/19 0305  WBC 2.4*  HGB 7.8*  HCT 24.0*  PLT 96*   Recent Labs    03/06/19 0305  NA 141  K 3.9  CL 106  CO2 26  GLUCOSE 144*  BUN 36*  CREATININE 1.15*  CALCIUM 9.0    Intake/Output Summary (Last 24 hours) at 03/08/2019 0746 Last data filed at 03/08/2019 0547 Gross per 24 hour  Intake 950 ml  Output 2150 ml  Net -1200 ml     Physical Exam: Vital Signs Blood pressure (!) 122/46, pulse 77, temperature 98.1 F (36.7 C), temperature source Oral, resp. rate 16, weight 73.3 kg, SpO2 97 %.  Constitutional: No distress . Vital signs reviewed. HENT: Normocephalic.  Atraumatic. Eyes: EOMI. No discharge. Cardiovascular: No JVD. Respiratory: Normal effort.  No stridor. GI: Non-distended. Skin: + Pale. Right AKA with dressing C/D/I Psych: Normal mood.  Normal behavior. Musc: Right AKA with edema and tenderness Neuro: Lethargic Motor: Bilateral upper extremities: Appear to be 4+/5 proximal distal Left lower extremity: Appears to be 3/5 hip flexion, knee extension and 4/5 ankle dorsiflexion Right lower extremity: Limited due to pain  Assessment/Plan: 1. 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  Physiatrist is providing close team supervision and 24 hour management of active medical problems listed below.  Physiatrist and rehab team continue to assess barriers to discharge/monitor patient progress toward functional and medical  goals  Care Tool:  Bathing    Body parts bathed by patient: Right arm, Left arm, Chest, Face   Body parts bathed by helper: Abdomen, Front perineal area, Buttocks, Right upper leg, Left upper leg, Left lower leg Body parts n/a: Right lower leg   Bathing assist Assist Level: 2 Helpers     Upper Body Dressing/Undressing Upper body dressing   What is the patient wearing?: Hospital gown only    Upper body assist Assist Level: Moderate Assistance - Patient 50 - 74%    Lower Body Dressing/Undressing Lower body dressing      What is the patient wearing?: Hospital gown only     Lower body assist Assist for lower body dressing: Moderate Assistance - Patient 50 - 74%     Toileting Toileting    Toileting assist Assist for toileting: 2 Helpers     Transfers Chair/bed transfer  Transfers assist  Chair/bed transfer activity did not occur: Safety/medical concerns(Pt with increased pain and LLE weakness limiting participation in therapy)  Chair/bed transfer assist level: 2 Helpers     Locomotion Ambulation   Ambulation assist   Ambulation activity did not occur: Safety/medical concerns(Pt with increased pain and LLE weakness limiting participation in therapy)          Walk 10 feet activity   Assist  Walk 10 feet activity did not occur: Safety/medical concerns(Pt with increased pain and LLE weakness limiting participation in therapy)        Walk 50 feet activity   Assist Walk 50 feet with 2 turns activity did not occur: Safety/medical concerns(Pt with increased pain and LLE  weakness limiting participation in therapy)         Walk 150 feet activity   Assist Walk 150 feet activity did not occur: Safety/medical concerns(Pt with increased pain and LLE weakness limiting participation in therapy)         Walk 10 feet on uneven surface  activity   Assist Walk 10 feet on uneven surfaces activity did not occur: Safety/medical concerns(Pt with increased  pain and LLE weakness limiting participation in therapy)         Wheelchair     Assist Will patient use wheelchair at discharge?: Yes Type of Wheelchair: Manual Wheelchair activity did not occur: Safety/medical concerns(Pt with increased pain and LLE weakness limiting participation in therapy)         Wheelchair 50 feet with 2 turns activity    Assist    Wheelchair 50 feet with 2 turns activity did not occur: Safety/medical concerns(Pt with increased pain and LLE weakness limiting participation in therapy)       Wheelchair 150 feet activity     Assist  Wheelchair 150 feet activity did not occur: Safety/medical concerns(Pt with increased pain and LLE weakness limiting participation in therapy)       Blood pressure (!) 122/46, pulse 77, temperature 98.1 F (36.7 C), temperature source Oral, resp. rate 16, weight 73.3 kg, SpO2 97 %.  1. 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 Vancomycin resumed for wound coverage 03/01/2019   Continue CIR 2. Antithrombotics:  -DVT/anticoagulation: Subcutaneous heparin- suggest Doppler of LLE due to severe pain of LLE   12/23- Dopplers of LLE (-) -antiplatelet therapy: Aspirin 81 mg daily  3. Pain Management: Lyrica 25 mg 3 times daily as needed, oxycodone as needed   lidoderm patches for L calf and coccyx; added skelaxin for muscle pain prn; and made lyrica scheduled- probably need to increase in 2-3 days.  Tylenol scheduled 4. Mood: Ego support -antipsychotic agents: N/A  5. Neuropsych: This patient is capable of making decisions on her own behalf?.  6. Skin/Wound Care: Routine skin checks   Appreciate vascular Recs-right groin with fat necrosis and serous drainage Will likely require groin debridement and possible VAC placement this week.  7. Fluids/Electrolytes/Nutrition: Routine in and outs. CMP ordered.  8. Acute blood loss anemia. CBC  stable on 12/24, with decreased Hgb on 12/25, will repeat tomorrow.    12/26: Hgb with increase.  9. History of TIA. Continue aspirin  10. Hypertension. Clonidine 0.2 mg nightly,Cozaar 100 mg daily, Demadex 20 mg daily, Procardia XL 30 mg daily. Monitor with increased mobility.   hydralazine 25 mg nightly increased to twice daily on 12/25, changed back to nightly on 12/27  Controlled on 12/28 11. Hypothyroidism. Synthroid  12. Diabetes mellitus peripheral neuropathy. Hemoglobin A1c 5.9. Lantus insulin 15 units nightly. Check blood sugars before meals and at bedtime. Monitor with increased mobility.  CBG (last 3)  Recent Labs    03/07/19 1649 03/07/19 2104 03/08/19 0531  GLUCAP 135* 200* 134*   Labile on 12/28  13. Hyperlipidemia. Zocor  14. Constipation. Laxative assistance  15. GERD. Protonix  16. Thrombocytopenia-   Platelets 96 on 12/26, labs pending for today 17. Leukopenia  WBCs 2.4 on 12/26, labs pending for today 18. Anemia-   Hemoglobin 7.8 on 12/26, labs pending for today-may require transfusion given multiple associated sequela 19. AKI-   Creatinine 1.15 on 12/26, labs pending for today  20. Infection?- Vanc restarted due to  increased drainage from groin:  Continue IV Vanco, critical value on 12/27 21. Hypokalemia:   Potassium 3.9 on 12/26, continue to monitor 22.  Sleep disturbance  Trazodone 100 nightly-consider decreasing dose if persistently lethargic in a.m. 23.  Pancytopenia  ?  Improving, labs pending for today  LOS: 6 days A FACE TO FACE EVALUATION WAS PERFORMED  Saif Peter Lorie Phenix 03/08/2019, 7:46 AM

## 2019-03-08 NOTE — Progress Notes (Signed)
Physical Therapy Session Note  Patient Details  Name: Debra Hopkins MRN: [id number] Date of Birth: November 01, 1940  Today's Date: 03/08/2019 PT Individual Time: 1305-1420 PT Individual Time Calculation (min): 75 min   Short Term Goals: Week 1:  PT Short Term Goal 1 (Week 1): pt will transfer supine<>sit with min A PT Short Term Goal 2 (Week 1): pt will transfer bed<>chair with LRAD mod A PT Short Term Goal 3 (Week 1): pt will perform car transfer with LRAD mod A  Skilled Therapeutic Interventions/Progress Updates: Pt presented in bed agreeable to therapy. Pt stating pain 5/10 at rest not able to take additional meds due to schedule. Performed supine to sit with min/modA for truncal support and heavy use of bed features. Pt set up and performed level SB transfer to R into w/c with CGA and increased time with verbal cues to increase forward lean and to promote head/hips relationship. Pt then propelled to rehab gym with supervision and verbal cues for steering as noted pt frequently drifting to R. Once in gym pt performed SB transfer to L CGA and increased time. Pt performed lateral scooting L/R with cues to increase forward lean. Pt required min cues to attention to task with activity. Participated in dynamic sitting balance and endurance activity with Zoom ball. Pt transferred to supine minA and participated in following therex; LLE SAQ 2lb cuff 2 x 10, LLE SLR 2 x 10, modified bridge pushing from bolster x 10. RLE hip flexion 2 x 10, hip abd 2 x 10. Pt required mod cues for technique and attention to task. Pt returned to supine modA primarly for truncal support and performed SB transfer to w/c to R minA. Pt attempted to remove SB prior to being completely in w/c and required assist from PTA for safe repositioning. Pt transported back to room at end of session and agreeable to remain in w/c to await next session with belt alarm in place, call bell within reach and needs met.      Therapy  Documentation Precautions:  Precautions Precautions: Fall Precaution Comments: R AKA NWB Restrictions Weight Bearing Restrictions: No RLE Weight Bearing: Non weight bearing General:     Therapy/Group: Individual Therapy  Khalee Mazo  Kruti Horacek, PTA  03/08/2019, 2:46 PM

## 2019-03-08 NOTE — Progress Notes (Signed)
Physical Therapy Session Note  Patient Details  Name: Debra Hopkins MRN: [id number] Date of Birth: [birth year]  Today's Date: 03/08/2019 PT Individual Time: 0900-0930 PT Individual Time Calculation (min): 30 min   Short Term Goals: Week 1:  PT Short Term Goal 1 (Week 1): pt will transfer supine<>sit with min A PT Short Term Goal 2 (Week 1): pt will transfer bed<>chair with LRAD mod A PT Short Term Goal 3 (Week 1): pt will perform car transfer with LRAD mod A  Skilled Therapeutic Interventions/Progress Updates:   Total assist to don retention sock on R residual limb with supervision to CGA for rolling with use of bedrail for support. Donned pants in similar fashion with pt assisting some but requires max assist to get all the way up and thread. CGA initially with extra time for supine to sit using bedrail for support, on second attempt after being in sidelying required min assist and cues for scooting to come to edge of bed. Performed overall min assist slideboard transfer to the R with focus on technique, hand placement, and using LLE to help "lift" as able. Discussed need for increasing UE strengthening to aid with all mobility at this time. Pt in agreement. Pt also reporting she feels like her medication is making her forgetful. Recommended to discuss with MD/RN as well.   Therapy Documentation Precautions:  Precautions Precautions: Fall Precaution Comments: R AKA NWB Restrictions Weight Bearing Restrictions: No RLE Weight Bearing: Non weight bearing  Pain: Reports premedicated for residual limb pain. Did not rate.    Therapy/Group: Individual Therapy  Canary Brim Ivory Broad, PT, DPT, CBIS  03/08/2019, 11:35 AM

## 2019-03-09 ENCOUNTER — Encounter (HOSPITAL_COMMUNITY)
Admission: RE | Disposition: A | Discharge status: Other Acute Inpatient Hospital | Payer: Self-pay | Source: Intra-hospital | Attending: Physical Medicine and Rehabilitation

## 2019-03-09 ENCOUNTER — Inpatient Hospital Stay (HOSPITAL_COMMUNITY): Payer: Medicare Other | Admitting: Occupational Therapy

## 2019-03-09 ENCOUNTER — Inpatient Hospital Stay (HOSPITAL_COMMUNITY): Admission: RE | Admit: 2019-03-09 | Payer: Medicare Other | Source: Home / Self Care | Admitting: Vascular Surgery

## 2019-03-09 ENCOUNTER — Inpatient Hospital Stay (HOSPITAL_COMMUNITY): Payer: Medicare Other | Admitting: Anesthesiology

## 2019-03-09 ENCOUNTER — Inpatient Hospital Stay (HOSPITAL_COMMUNITY): Payer: Medicare Other

## 2019-03-09 ENCOUNTER — Encounter (HOSPITAL_COMMUNITY): Payer: Self-pay | Admitting: Physical Medicine and Rehabilitation

## 2019-03-09 DIAGNOSIS — I9789 Other postprocedural complications and disorders of the circulatory system, not elsewhere classified: Secondary | ICD-10-CM

## 2019-03-09 HISTORY — PX: WOUND DEBRIDEMENT: SHX247

## 2019-03-09 HISTORY — PX: APPLICATION OF WOUND VAC: SHX5189

## 2019-03-09 LAB — GLUCOSE, CAPILLARY
Glucose-Capillary: 158 mg/dL — ABNORMAL HIGH (ref 70–99)
Glucose-Capillary: 153 mg/dL — ABNORMAL HIGH (ref 70–99)
Glucose-Capillary: 112 mg/dL — ABNORMAL HIGH (ref 70–99)
Glucose-Capillary: 148 mg/dL — ABNORMAL HIGH (ref 70–99)
Glucose-Capillary: 180 mg/dL — ABNORMAL HIGH (ref 70–99)
Glucose-Capillary: 162 mg/dL — ABNORMAL HIGH (ref 70–99)

## 2019-03-09 SURGERY — DEBRIDEMENT, WOUND
Anesthesia: General | Site: Groin | Laterality: Right

## 2019-03-09 MED ORDER — ACETAMINOPHEN 500 MG PO TABS
1000.0000 mg | ORAL_TABLET | Freq: Once | ORAL | Status: AC
  Administered 2019-03-09: 1000 mg via ORAL
  Filled 2019-03-09: qty 2

## 2019-03-09 MED ORDER — ONDANSETRON HCL 4 MG/2ML IJ SOLN
INTRAMUSCULAR | Status: DC | PRN
  Administered 2019-03-09: 4 mg via INTRAVENOUS

## 2019-03-09 MED ORDER — GLYCOPYRROLATE 0.2 MG/ML IJ SOLN
INTRAMUSCULAR | Status: DC | PRN
  Administered 2019-03-09: .1 mg via INTRAVENOUS

## 2019-03-09 MED ORDER — CEFAZOLIN SODIUM-DEXTROSE 2-4 GM/100ML-% IV SOLN
2.0000 g | Freq: Two times a day (BID) | INTRAVENOUS | Status: DC
  Administered 2019-03-09 – 2019-03-15 (×12): 2 g via INTRAVENOUS
  Filled 2019-03-09 (×16): qty 100

## 2019-03-09 MED ORDER — FENTANYL CITRATE (PF) 100 MCG/2ML IJ SOLN
50.0000 ug | Freq: Once | INTRAMUSCULAR | Status: AC
  Administered 2019-03-09: 16:00:00 50 ug via INTRAVENOUS

## 2019-03-09 MED ORDER — FENTANYL CITRATE (PF) 100 MCG/2ML IJ SOLN
INTRAMUSCULAR | Status: DC | PRN
  Administered 2019-03-09: 25 ug via INTRAVENOUS
  Administered 2019-03-09: 50 ug via INTRAVENOUS
  Administered 2019-03-09: 25 ug via INTRAVENOUS

## 2019-03-09 MED ORDER — SODIUM CHLORIDE 0.9 % IR SOLN
Status: DC | PRN
  Administered 2019-03-09: 3000 mL

## 2019-03-09 MED ORDER — PROPOFOL 10 MG/ML IV BOLUS
INTRAVENOUS | Status: DC | PRN
  Administered 2019-03-09: 130 mg via INTRAVENOUS

## 2019-03-09 MED ORDER — LIDOCAINE HCL (CARDIAC) PF 100 MG/5ML IV SOSY
PREFILLED_SYRINGE | INTRAVENOUS | Status: DC | PRN
  Administered 2019-03-09: 60 mg via INTRAVENOUS

## 2019-03-09 MED ORDER — LACTATED RINGERS IV SOLN: INTRAVENOUS | Status: DC

## 2019-03-09 MED ORDER — VANCOMYCIN HCL 1000 MG IV SOLR
INTRAVENOUS | Status: DC | PRN
  Administered 2019-03-09: 1000 mg via INTRAVENOUS

## 2019-03-09 MED ORDER — EPHEDRINE SULFATE 50 MG/ML IJ SOLN
INTRAMUSCULAR | Status: DC | PRN
  Administered 2019-03-09: 5 mg via INTRAVENOUS
  Administered 2019-03-09: 10 mg via INTRAVENOUS

## 2019-03-09 MED ORDER — FENTANYL CITRATE (PF) 100 MCG/2ML IJ SOLN
100.0000 ug | Freq: Once | INTRAMUSCULAR | Status: AC
  Administered 2019-03-09: 50 ug via INTRAVENOUS
  Filled 2019-03-09: qty 2

## 2019-03-09 MED ORDER — 0.9 % SODIUM CHLORIDE (POUR BTL) OPTIME
TOPICAL | Status: DC | PRN
  Administered 2019-03-09: 1000 mL

## 2019-03-09 SURGICAL SUPPLY — 27 items
CANISTER SUCT 3000ML PPV (MISCELLANEOUS) ×6 IMPLANT
COVER SURGICAL LIGHT HANDLE (MISCELLANEOUS) ×3 IMPLANT
COVER WAND RF STERILE (DRAPES) ×3 IMPLANT
DRSG VAC ATS LRG SENSATRAC (GAUZE/BANDAGES/DRESSINGS) IMPLANT
DRSG VAC ATS MED SENSATRAC (GAUZE/BANDAGES/DRESSINGS) IMPLANT
DRSG VAC ATS SM SENSATRAC (GAUZE/BANDAGES/DRESSINGS) ×2 IMPLANT
ELECT REM PT RETURN 9FT ADLT (ELECTROSURGICAL) ×3
ELECTRODE REM PT RTRN 9FT ADLT (ELECTROSURGICAL) ×1 IMPLANT
GLOVE BIO SURGEON STRL SZ7.5 (GLOVE) ×3 IMPLANT
GOWN STRL REUS W/ TWL LRG LVL3 (GOWN DISPOSABLE) ×3 IMPLANT
GOWN STRL REUS W/ TWL XL LVL3 (GOWN DISPOSABLE) ×1 IMPLANT
GOWN STRL REUS W/TWL LRG LVL3 (GOWN DISPOSABLE) ×9
GOWN STRL REUS W/TWL XL LVL3 (GOWN DISPOSABLE) ×3
HANDPIECE INTERPULSE COAX TIP (DISPOSABLE) ×3
KIT BASIN OR (CUSTOM PROCEDURE TRAY) ×3 IMPLANT
KIT TURNOVER KIT B (KITS) ×3 IMPLANT
NS IRRIG 1000ML POUR BTL (IV SOLUTION) ×3 IMPLANT
PACK GENERAL/GYN (CUSTOM PROCEDURE TRAY) ×3 IMPLANT
PACK UNIVERSAL I (CUSTOM PROCEDURE TRAY) ×2 IMPLANT
PAD ARMBOARD 7.5X6 YLW CONV (MISCELLANEOUS) ×6 IMPLANT
PAD NEG PRESSURE SENSATRAC (MISCELLANEOUS) ×3 IMPLANT
SET HNDPC FAN SPRY TIP SCT (DISPOSABLE) IMPLANT
SUT VIC AB 2-0 CT1 18 (SUTURE) ×2 IMPLANT
SUT VIC AB 2-0 CT1 27 (SUTURE) ×3
SUT VIC AB 2-0 CT1 TAPERPNT 27 (SUTURE) IMPLANT
TOWEL GREEN STERILE (TOWEL DISPOSABLE) ×3 IMPLANT
WATER STERILE IRR 1000ML POUR (IV SOLUTION) IMPLANT

## 2019-03-09 NOTE — Progress Notes (Signed)
Notified by on call Judeth Porch with follow up on Ancef administration.

## 2019-03-09 NOTE — Anesthesia Preprocedure Evaluation (Addendum)
Anesthesia Evaluation  Patient identified by MRN, date of birth, ID band Patient awake    Reviewed: Allergy & Precautions, H&P , NPO status , Patient's Chart, lab work & pertinent test results  History of Anesthesia Complications (+) POST - OP SPINAL HEADACHE  Airway Mallampati: III  TM Distance: >3 FB Neck ROM: Full    Dental no notable dental hx. (+) Dental Advisory Given   Pulmonary shortness of breath, former smoker,  Quit smoking 1987   Pulmonary exam normal        Cardiovascular hypertension, Pt. on medications + CAD, + Peripheral Vascular Disease and +CHF  Normal cardiovascular exam  Acute RLE ischemia, L fem pseudoaneurysm PVD- plavix; inpt on heparin gtt   Neuro/Psych Carotid artery stenosis TIAnegative psych ROS   GI/Hepatic GERD  Controlled and Medicated,Diverticulosis, hemorrhoids   Endo/Other  diabetes, Type 2, Oral Hypoglycemic Agents, Insulin DependentHypothyroidism   Renal/GU CRF and Renal InsufficiencyRenal diseaseCr 1.6- seems to be AKI on CKD  negative genitourinary   Musculoskeletal  (+) Arthritis , Osteoarthritis,  Fibromyalgia -DDD, fibromyalgia   Abdominal   Peds  Hematology  (+) anemia ,   Anesthesia Other Findings HLD  Reproductive/Obstetrics negative OB ROS                            Anesthesia Physical  Anesthesia Plan  ASA: III  Anesthesia Plan: General   Post-op Pain Management:    Induction: Intravenous  PONV Risk Score and Plan: 3 and Ondansetron, Treatment may vary due to age or medical condition and Dexamethasone  Airway Management Planned: Oral ETT and LMA  Additional Equipment:   Intra-op Plan:   Post-operative Plan: Extubation in OR  Informed Consent: I have reviewed the patients History and Physical, chart, labs and discussed the procedure including the risks, benefits and alternatives for the proposed anesthesia with the patient or  authorized representative who has indicated his/her understanding and acceptance.     Dental advisory given  Plan Discussed with: CRNA and Anesthesiologist  Anesthesia Plan Comments:        Anesthesia Quick Evaluation

## 2019-03-09 NOTE — Anesthesia Procedure Notes (Signed)
Procedure Name: LMA Insertion Date/Time: 03/09/2019 4:24 PM Performed by: Inda Coke, CRNA Pre-anesthesia Checklist: Patient identified, Emergency Drugs available, Suction available and Patient being monitored Patient Re-evaluated:Patient Re-evaluated prior to induction Oxygen Delivery Method: Circle System Utilized Preoxygenation: Pre-oxygenation with 100% oxygen Induction Type: IV induction Ventilation: Mask ventilation without difficulty LMA: LMA inserted LMA Size: 4.0 Number of attempts: 1 Placement Confirmation: positive ETCO2 Tube secured with: Tape Dental Injury: Teeth and Oropharynx as per pre-operative assessment

## 2019-03-09 NOTE — Progress Notes (Signed)
Occupational Therapy Session Note  Patient Details  Name: Debra Hopkins MRN: [id number] Date of Birth: 1941-01-08  Today's Date: 03/09/2019 OT Individual Time: 2641-5830 OT Individual Time Calculation (min): 30 min    Short Term Goals: Week 1:  OT Short Term Goal 1 (Week 1): Pt will don pants with mod A OT Short Term Goal 2 (Week 1): Pt will complete transfer to [facility] with max +1 OT Short Term Goal 3 (Week 1): Pt will propel w/c 50 ft with min A to increase home accessibility  Skilled Therapeutic Interventions/Progress Updates:    Pt greeted supine in bed asleep. Pt opens eyes briefly to answer OT questions, then closes eyes again. OT turned on lights with some improved alertness. Pt declined to get OOB this session. Pt asked OT "How did the procedure go?" OT informed pt she had not been down for procedure yet. Pt completed L residual limb there-ex of hip abduction/adduction and hip flexion in flat bet, 3 sets of 10, but needed continuous cues to stay awake and complete set. Pt noted to be incontinent of bladder. Pt rolled to the L with mod A while OT provided total A for peri-care and brief change. Barrier cream applied to buttocks for redness. OT supplied pt with wash cloth and new gown. Pt needed hand over hand to initiate washing face 2/2 lethargy, but then was able to finish task. Provided pt with new gown with pt needing mod cues to initiate threading arms through sleeves. OT put pt in sidelying for pressure relief. Pt left with bed alarm on, call bell in reach, and needs met. Pt missed 30 minutes of OT treatment session 2/2 lethargy.   Therapy Documentation Precautions:  Precautions Precautions: Fall Precaution Comments: R AKA NWB Restrictions Weight Bearing Restrictions: Yes RLE Weight Bearing: Non weight bearing General: General OT Amount of Missed Time: 30 Minutes Pain: No pain reported  Therapy/Group: Individual Therapy  Valma Cava 03/09/2019, 11:40 AM

## 2019-03-09 NOTE — Patient Care Conference (Signed)
Inpatient RehabilitationTeam Conference and Plan of Care Update Date: 03/09/2019   Time: 11:40 AM    Patient Name: Debra Hopkins      Medical Record Number: [id number]  Date of Birth: 1940-11-11 Sex: Female         Room/Bed: 4M09C/4M09C-01 Payor Info: Payor: [employer] MEDICARE / Plan: [employer] MEDICARE / Product Type: *No Product type* /    Admit Date/Time:  03/02/2019  4:36 PM  Primary Diagnosis:  S/P AKA (above knee amputation) unilateral, right [facility])  Patient Active Problem List   Diagnosis Date Noted  . AKI (acute kidney injury) ([place])   . Leukopenia   . Acute blood loss anemia   . Sleep disturbance   . Diabetic peripheral neuropathy ([place])   . Labile blood glucose   . Pressure injury of skin 03/03/2019  . S/P AKA (above knee amputation) unilateral, right ([place]) 03/02/2019  . Right below-knee amputee ([place]) 03/02/2019  . PAD (peripheral artery disease) ([place]) 02/15/2019  . Type 2 diabetes mellitus with stage 3 chronic kidney disease, without long-term current use of insulin ([place]) 10/16/2015  . Obesity 10/16/2015  . Anemia 02/21/2015  . GERD (gastroesophageal reflux disease) 02/08/2014  . Cough 02/08/2014  . Dysphagia, unspecified(787.20) 09/27/2013  . Fibromyalgia 09/27/2013  . Hypothyroidism 09/27/2013  . Aftercare following surgery of the circulatory system, [place] 02/25/2012  . Occlusion and stenosis of carotid artery without mention of cerebral infarction 02/19/2011  . CAD 07/31/2009  . Mixed hyperlipidemia 11/22/2008  . Essential hypertension, benign 11/22/2008  . PVD 11/22/2008  . DYSPNEA 11/22/2008    Expected Discharge Date: Expected Discharge Date: 03/29/19  Team Members Present: Physician leading conference: Dr. Courtney Heys Social Worker Present: Lennart Pall, LCSW Nurse Present: Suella [name], RN Case manager: Karene Fry, RN PT Present: Apolinar Junes, PT OT Present: Elisabeth Most, OT SLP Present: Charolett Bumpers, SLP PPS Coordinator present : Ileana Ladd, Burna Mortimer, SLP     Current Status/Progress Goal Weekly Team Focus  Bowel/Bladder   Incontinent of B/B/, patient is currently voiding m check Bladder scan O 8 hrs for no voids  Regain toileting needs  Assess toileting needs Q2 hrs and prn   Swallow/Nutrition/ Hydration             ADL's   Max A overall, +2 for slideboard or steady transfers,  Mod I goals, will need to be downgraded to supervision/CGA  activity tolerance, transfers, LB and UB strengthening, self-care retraining, amputee education   Mobility   Mod A bed mobility, min A transfers, supervision w/c mobility  Supervision-mod I goals, gait 5 feet, may need to downgrade goals if      Communication             Safety/Cognition/ Behavioral Observations  Min-Max A, limited participation due to pain  Supervision A  orientation, basic-semi-complex problem solving, emergent awareness, memory notebook and selective attention   Pain   recieving prn medications for pain right groin/thign and sacral area rating pain 8-9/10 on pain scale  < 3  Assess QS/PRN and evaluate effectiveness of medication   Skin   wound care to right groin/ right thigh BID,s/p right BFPG with post op complications, s/p right AKA healing well  prevention of infections  Plan surgical procedure 03/09/19,, QS/PRN assessment    Rehab Goals Patient on target to meet rehab goals: Yes *See Care Plan and progress notes for long and short-term goals.     Barriers to Discharge  Current Status/Progress Possible Resolutions Date Resolved  Nursing                  PT                    OT                  SLP                SW                Discharge Planning/Teaching Needs:  Home with son to arrange needed support, however, concerned they may not be able to cover 24/7  Teaching to be planned prior to d/c.   Team Discussion: For debridement of R groin today, WBC low, Hgb is down, recheck labs tomorrow, may have wound VAC post op.  RN - to OR  later today, NPO, lethargic, stage 2 on bottom, pain is managed.  OT max +2 steady, UB ADL set up, LB ADL max A.  PT bed mod/max, slide board Min A, max +2 stand in steady, S/mod I goals, lives alone, son only at times, May downgrade goals to min A.  SLP goals S, is min to max now, has Social worker.   Revisions to Treatment Plan: N/A     Medical Summary Current Status: Stage II on bottom; continent; a little lethargic; Weekly Focus/Goal: OT - max assist of 2 for steady; constant support; max assist LB; UB set-up; bed mod-max assist; SB transfers min assist; max assist standing in steady  Barriers to Discharge: Behavior;Medical stability;IV antibiotics;Wound care;Weight bearing restrictions;Home enviroment access/layout;Weight  Barriers to Discharge Comments: wound VAC to be placed today at surgery- R groin Possible Resolutions to Barriers: goals supervision-mod I; concern because no help at home; and is mod-max assist overall- down grade goals to min assist- no gait goals now   Continued Need for Acute Rehabilitation Level of Care: The patient requires daily medical management by a physician with specialized training in physical medicine and rehabilitation for the following reasons: Direction of a multidisciplinary physical rehabilitation program to maximize functional independence : Yes Medical management of patient stability for increased activity during participation in an intensive rehabilitation regime.: Yes Analysis of laboratory values and/or radiology reports with any subsequent need for medication adjustment and/or medical intervention. : Yes   I attest that I was present, lead the team conference, and concur with the assessment and plan of the team.   Jodell Cipro M 03/10/2019, 12:07 PM  Team conference was held via web/ teleconference due to [place] - 19

## 2019-03-09 NOTE — Progress Notes (Signed)
Physical Therapy Session Note  Patient Details  Name: Debra Hopkins MRN: [id number] Date of Birth: 30-Nov-1940  Today's Date: 03/09/2019 PT Individual Time: 0910-0940 PT Individual Time Calculation (min): 30 min   Short Term Goals: Week 1:  PT Short Term Goal 1 (Week 1): pt will transfer supine<>sit with min A PT Short Term Goal 2 (Week 1): pt will transfer bed<>chair with LRAD mod A PT Short Term Goal 3 (Week 1): pt will perform car transfer with LRAD mod A  Skilled Therapeutic Interventions/Progress Updates:     Patient in bed with RN in room upon PT arrival. Patient alert and agreeable to PT session. Reported increased fatigue this morning due to not eating with NPO orders for sugery scheduled later today. Patient was incontinent of bladder and agreeable to performing bed mobility with PT while getting cleaned up. Patient rolled R and L with min A using bed rails, provided cues for bending and pushing through L LE when rolling R and reaching R residual limb and arm across her body when rolling L. Educated on alternating lying supine and side lying as tolerate for pressure relief due to sacral wound. Required total A for peri-care and donning/doffing incontinence brief and hospital gown. Bed sheets and linens changed with NT assisting. Patient decline sitting EOB and bed level exercises due to fatigue. Patient missed 30 min of skilled PT due to fatigue. Patient in bed with RN in room at end of session with breaks locked, bed alarm set, and all needs within reach.    Therapy Documentation Precautions:  Precautions Precautions: Fall Precaution Comments: R AKA NWB Restrictions Weight Bearing Restrictions: Yes RLE Weight Bearing: Non weight bearing General: PT Amount of Missed Time (min): 30 Minutes PT Missed Treatment Reason: Patient fatigue    Therapy/Group: Individual Therapy  Iliya Spivack L Gillian Kluever PT, DPT  03/09/2019, 12:48 PM

## 2019-03-09 NOTE — Op Note (Signed)
    Patient name: Debra Hopkins MRN: [id number] DOB: 10/05/1940 Sex: female  03/09/2019 Pre-operative Diagnosis: Right groin postsurgical wound Post-operative diagnosis:  Same Surgeon:  Erlene Quan C. [name], MD Assistant: Laurence Slate, PA Procedure Performed: 1.  Sharp excisional debridement right groin wound of skin and subcutaneous tissue to 8 x 5 cm 2.  Right sartorius muscle rotational flap 3.  Placement of right groin wound VAC   Indications: 78 year old female underwent femoral to below-knee bypass with vein.  She had progressive ischemia of the right foot subsequently underwent right above-knee amputation.  She now has wound breakdown in the right groin with serous drainage.  She is indicated for operative debridement.  Findings: Wound itself was significantly friable.  We debrided skin and subcutaneous tissue back to healthy bleeding tissue.  There was no healthy tissue to place over the vein and the sartorius muscle was rotated over top.  Muscle was healthy.  Wound VAC was placed.   Procedure:  The patient was identified in the holding area and taken to the operating room where LMA anesthesia was induced.  She was gently prepped draped in the right lower extremity usual fashion.  Antibiotics were up-to-date timeout was called.  We began by opening the previous incision.  There was significant fibrinous unhealthy tissue.  This was debrided back to healthy tissue with scissors.  Hemostasis was obtained.  The bypass was notable at the bed of the wound.  There was minimal tissue I could be brought over top of the bypass.  We thoroughly irrigated the wound with Pulsavac to 3 L total.  I then bluntly mobilized the sartorius muscle laterally.  We used cautery to take it off the anterior superior iliac spine.  It was mobilized medially intact medially with interrupted 2-0 Vicryl suture.  Hemostasis was obtained in the wound.  The muscle appeared healthy.  A wound VAC was placed to suction.  She was awake  from anesthesia having tolerated procedure without immediate complication.  All counts were correct at completion.  EBL: 50 cc    Boone Gear C. [name], MD [facility] [place] Office: [phone] Pager: [phone]

## 2019-03-09 NOTE — Progress Notes (Signed)
Patient is NPO for surgical procedure

## 2019-03-09 NOTE — Progress Notes (Signed)
Called Dr Tobias Alexander, patient's pain is 8/10. Verbal order to give 50 mcg fentanyl and may repeat x 1 50 mcg fentanyl.. verified verbal order.  Dr Tobias Alexander also informed of low hgb 7.1 03/08/19.  No orders given.

## 2019-03-09 NOTE — Transfer of Care (Signed)
Immediate Anesthesia Transfer of Care Note  Patient: Debra Hopkins  Procedure(s) Performed: DEBRIDEMENT WOUND RIGHT GROIN (Right Groin) APPLICATION OF WOUND VAC RIGHT GROIN (Right Groin)  Patient Location: PACU  Anesthesia Type:General  Level of Consciousness: awake and alert   Airway & Oxygen Therapy: Patient Spontanous Breathing and Patient connected to nasal cannula oxygen  Post-op Assessment: Report given to RN and Post -op Vital signs reviewed and stable  Post vital signs: Reviewed and stable  Last Vitals:  Vitals Value Taken Time  BP 157/60 03/09/19 1709  Temp    Pulse 98 03/09/19 1709  Resp 12 03/09/19 1709  SpO2 97 % 03/09/19 1709  Vitals shown include unvalidated device data.  Last Pain:  Vitals:   03/09/19 1600  TempSrc:   PainSc: 7       Patients Stated Pain Goal: 3 ([id number])  Complications: No apparent anesthesia complications

## 2019-03-09 NOTE — Progress Notes (Signed)
[facility] PHYSICAL MEDICINE & REHABILITATION PROGRESS NOTE   Subjective/Complaints:   Pt NPO for surgical procedure today- said pain isn't as bad and "thought her groin wound was better".   ROS: Patient denies CP, shortness of breath, nausea, vomiting or diarrhea.  Objective:   No results found. Recent Labs    03/08/19 0824  WBC 1.9*  HGB 7.1*  HCT 22.6*  PLT 113*   Recent Labs    03/08/19 0824  NA 141  K 4.4  CL 104  CO2 28  GLUCOSE 161*  BUN 46*  CREATININE 1.39*  CALCIUM 9.1    Intake/Output Summary (Last 24 hours) at 03/09/2019 0839 Last data filed at 03/09/2019 0836 Gross per 24 hour  Intake 300 ml  Output 600 ml  Net -300 ml     Physical Exam: Vital Signs Blood pressure 124/60, pulse 65, temperature 97.9 F (36.6 C), resp. rate 18, weight 73.3 kg, SpO2 100 %.  Constitutional: No distress . Vital signs reviewed. Sitting up in bed; watching TV, NAD HENT: Normocephalic.  Atraumatic. Eyes: EOMI. No discharge. Cardiovascular: No JVD. Respiratory: Normal effort.  No stridor. GI: Non-distended. Skin: + Pale. Still this AM Right AKA with dressing C/D/I Psych: flat affect Musc: Right AKA with edema and tenderness Neuro: Lethargic Motor: Bilateral upper extremities: Appear to be 4+/5 proximal distal Left lower extremity: Appears to be 3/5 hip flexion, knee extension and 4/5 ankle dorsiflexion Right lower extremity: Limited due to pain  Assessment/Plan: 1. 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  Physiatrist is providing close team supervision and 24 hour management of active medical problems listed below.  Physiatrist and rehab team continue to assess barriers to discharge/monitor patient progress toward functional and medical goals  Care Tool:  Bathing    Body parts bathed by patient: Right arm,  Left arm, Chest, Face   Body parts bathed by helper: Abdomen, Front perineal area, Buttocks, Right upper leg, Left upper leg, Left lower leg Body parts n/a: Right lower leg   Bathing assist Assist Level: 2 Helpers     Upper Body Dressing/Undressing Upper body dressing   What is the patient wearing?: Hospital gown only    Upper body assist Assist Level: Moderate Assistance - Patient 50 - 74%    Lower Body Dressing/Undressing Lower body dressing      What is the patient wearing?: Hospital gown only     Lower body assist Assist for lower body dressing: Moderate Assistance - Patient 50 - 74%     Toileting Toileting    Toileting assist Assist for toileting: 2 Helpers     Transfers Chair/bed transfer  Transfers assist  Chair/bed transfer activity did not occur: Safety/medical concerns(Pt with increased pain and LLE weakness limiting participation in therapy)  Chair/bed transfer assist level: Minimal Assistance - Patient > 75%     Locomotion Ambulation   Ambulation assist   Ambulation activity did not occur: Safety/medical concerns(Pt with increased pain and LLE weakness limiting participation in therapy)          Walk 10 feet activity   Assist  Walk 10 feet activity did not occur: Safety/medical concerns(Pt with increased pain and LLE weakness limiting participation in therapy)        Walk 50 feet activity   Assist Walk 50 feet with 2 turns activity did not occur: Safety/medical concerns(Pt with increased pain and LLE weakness  limiting participation in therapy)         Walk 150 feet activity   Assist Walk 150 feet activity did not occur: Safety/medical concerns(Pt with increased pain and LLE weakness limiting participation in therapy)         Walk 10 feet on uneven surface  activity   Assist Walk 10 feet on uneven surfaces activity did not occur: Safety/medical concerns(Pt with increased pain and LLE weakness limiting participation in  therapy)         Wheelchair     Assist Will patient use wheelchair at discharge?: Yes Type of Wheelchair: Manual Wheelchair activity did not occur: Safety/medical concerns(Pt with increased pain and LLE weakness limiting participation in therapy)         Wheelchair 50 feet with 2 turns activity    Assist    Wheelchair 50 feet with 2 turns activity did not occur: Safety/medical concerns(Pt with increased pain and LLE weakness limiting participation in therapy)       Wheelchair 150 feet activity     Assist  Wheelchair 150 feet activity did not occur: Safety/medical concerns(Pt with increased pain and LLE weakness limiting participation in therapy)       Blood pressure 124/60, pulse 65, temperature 97.9 F (36.6 C), resp. rate 18, weight 73.3 kg, SpO2 100 %.  1. 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 Vancomycin resumed for wound coverage 03/01/2019   Continue CIR 2. Antithrombotics:  -DVT/anticoagulation: Subcutaneous heparin- suggest Doppler of LLE due to severe pain of LLE   12/23- Dopplers of LLE (-) -antiplatelet therapy: Aspirin 81 mg daily  3. Pain Management: Lyrica 25 mg 3 times daily as needed, oxycodone as needed   lidoderm patches for L calf and coccyx; added skelaxin for muscle pain prn; and made lyrica scheduled- probably need to increase in 2-3 days.  Tylenol scheduled 4. Mood: Ego support -antipsychotic agents: N/A  5. Neuropsych: This patient is capable of making decisions on her own behalf?.  6. Skin/Wound Care: Routine skin checks   Appreciate vascular Recs-right groin with fat necrosis and serous drainage Will likely require groin debridement and possible VAC placement this week.   12/29- OR today 7. Fluids/Electrolytes/Nutrition: Routine in and outs. CMP ordered.  8. Acute blood loss anemia. CBC stable on 12/24, with decreased Hgb on 12/25, will repeat  tomorrow.    12/26: Hgb with increase.  12/29- Hb down to 7.1 from 7.8- with OR today, will need transfusion.  9. History of TIA. Continue aspirin  10. Hypertension. Clonidine 0.2 mg nightly,Cozaar 100 mg daily, Demadex 20 mg daily, Procardia XL 30 mg daily. Monitor with increased mobility.   hydralazine 25 mg nightly increased to twice daily on 12/25, changed back to nightly on 12/27  Controlled on 12/28 11. Hypothyroidism. Synthroid  12. Diabetes mellitus peripheral neuropathy. Hemoglobin A1c 5.9. Lantus insulin 15 units nightly. Check blood sugars before meals and at bedtime. Monitor with increased mobility.  CBG (last 3)  Recent Labs    03/08/19 1707 03/08/19 2113 03/09/19 0626  GLUCAP 159* 157* 158*   stable on 12/29  13. Hyperlipidemia. Zocor  14. Constipation. Laxative assistance  15. GERD. Protonix  16. Thrombocytopenia-   Platelets 96 on 12/26, labs pending for today  12/29- Plts slightly better at 113k 17. Leukopenia  WBCs 2.4 on 12/26, labs pending for today  12/29- WBC 1.9 18. Anemia-   Hemoglobin 7.8 on 12/26, labs pending for today-may require  transfusion given multiple associated sequela  12/29- Hb down to 7.1- will reorder for tomorrow, -likely will need transfusion.  19. AKI-   Creatinine 1.15 on 12/26, labs pending for today   12/29- Cr 1.39- likely due to Vanc 20. Infection?- Vanc restarted due to increased drainage from groin:  Continue IV Vanco, critical value on 12/27 21. Hypokalemia:   Potassium 3.9 on 12/26, continue to monitor  12/29- K+ 4.4 22.  Sleep disturbance  Trazodone 100 nightly-consider decreasing dose if persistently lethargic in a.m. 23.  Pancytopenia  ?  Improving, labs pending for today  12/29-a little worse- will recheck in AM- could be due to Vanc   LOS: 7 days A FACE TO FACE EVALUATION WAS PERFORMED  Belanna Manring 03/09/2019, 8:39 AM

## 2019-03-09 NOTE — Progress Notes (Signed)
Spoke with Marlowe Shores, PA, regarding current order for IV Ancef administration and documented allergy to meds, Informed to administer and monitor

## 2019-03-09 NOTE — Progress Notes (Signed)
Physical Therapy Note  Patient Details  Name: Debra Hopkins MRN: [id number] Date of Birth: November 01, 1940 Today's Date: 03/09/2019    Patient in bed asleep upon PT arrival. Patient reported that she does not feel well this afternoon. Denied nausea, pain, and headache. Indicated that she felt confused. She was disoriented to place, time, and situation, but oriented to self. Vitals: BP 133/47, HR 72, SPO2 98% on RA, Temp 98.4, RN made aware. Patient had difficulty staying awake and was very lethargic while answering orientation questions. Patient was unable to remain awake and follow cues when attempting bed level exercises, RN made aware. Patient missed 75 min of skilled PT due to lethargy. Will attempt to make up missed time as able.  Layth Cerezo L Zaki Gertsch PT, DPT  03/09/2019, 1:16 PM

## 2019-03-09 NOTE — Anesthesia Postprocedure Evaluation (Signed)
Anesthesia Post Note  Patient: Debra Hopkins  Procedure(s) Performed: DEBRIDEMENT WOUND RIGHT GROIN (Right Groin) APPLICATION OF WOUND VAC RIGHT GROIN (Right Groin)     Patient location during evaluation: PACU Anesthesia Type: General Level of consciousness: sedated Pain management: pain level controlled Vital Signs Assessment: post-procedure vital signs reviewed and stable Respiratory status: spontaneous breathing and respiratory function stable Cardiovascular status: stable Postop Assessment: no apparent nausea or vomiting Anesthetic complications: no    Last Vitals:  Vitals:   03/09/19 1710 03/09/19 1725  BP: (!) 157/60 (!) 143/70  Pulse: 98 97  Resp: 12 17  Temp: (!) 36.3 C   SpO2: 97% 99%    Last Pain:  Vitals:   03/09/19 1710  TempSrc:   PainSc: Asleep                 Shakedra Beam DANIEL

## 2019-03-09 NOTE — Progress Notes (Signed)
Received pt. From PACU nurse, pt. Was crying of pain, Oxy IR 5 mg given.

## 2019-03-09 NOTE — Progress Notes (Signed)
  Progress Note    03/09/2019 2:01 PM Day of Surgery  Subjective: No overnight issues  Vitals:   03/09/19 1314 03/09/19 1327  BP: (!) 133/47 (!) 131/53  Pulse: 72 76  Resp: 18   Temp: 98.4 F (36.9 C) 98.4 F (36.9 C)  SpO2:  100%    Physical Exam: Awake alert Unlabored respirations Right groin with serous drainage and fibrinous exudate  CBC    Component Value Date/Time   WBC 1.9 (L) 03/08/2019 0824   RBC 2.35 (L) 03/08/2019 0824   HGB 7.1 (L) 03/08/2019 0824   HCT 22.6 (L) 03/08/2019 0824   PLT 113 (L) 03/08/2019 0824   MCV 96.2 03/08/2019 0824   MCH 30.2 03/08/2019 0824   MCHC 31.4 03/08/2019 0824   RDW 13.8 03/08/2019 0824   LYMPHSABS 0.8 03/08/2019 0824   MONOABS 0.4 03/08/2019 0824   EOSABS 0.0 03/08/2019 0824   BASOSABS 0.0 03/08/2019 0824    BMET    Component Value Date/Time   NA 141 03/08/2019 0824   NA 137 11/04/2018 1132   K 4.4 03/08/2019 0824   CL 104 03/08/2019 0824   CO2 28 03/08/2019 0824   GLUCOSE 161 (H) 03/08/2019 0824   BUN 46 (H) 03/08/2019 0824   BUN 24 11/04/2018 1132   CREATININE 1.39 (H) 03/08/2019 0824   CALCIUM 9.1 03/08/2019 0824   GFRNONAA 36 (L) 03/08/2019 0824   GFRAA 42 (L) 03/08/2019 0824    INR    Component Value Date/Time   INR 1.01 12/18/2016 1508     Intake/Output Summary (Last 24 hours) at 03/09/2019 1401 Last data filed at 03/09/2019 1242 Gross per 24 hour  Intake 180 ml  Output 600 ml  Net -420 ml     Assessment/plan:  78 y.o. female is status post right lower extremity bypass subsequent amputation above the knee.  Plan will be for operative debridement of right groin today in the OR.  She can likely return to rehab following surgery.  Onia Shiflett C. [name], MD [facility] Office: [phone] Pager: [phone]  03/09/2019 2:01 PM

## 2019-03-10 ENCOUNTER — Inpatient Hospital Stay (HOSPITAL_COMMUNITY): Payer: Medicare Other | Admitting: Occupational Therapy

## 2019-03-10 ENCOUNTER — Inpatient Hospital Stay (HOSPITAL_COMMUNITY): Payer: Medicare Other

## 2019-03-10 LAB — PREPARE RBC (CROSSMATCH)

## 2019-03-10 LAB — BASIC METABOLIC PANEL
Anion gap: 10 (ref 5–15)
BUN: 48 mg/dL — ABNORMAL HIGH (ref 8–23)
CO2: 26 mmol/L (ref 22–32)
Calcium: 9 mg/dL (ref 8.9–10.3)
Chloride: 102 mmol/L (ref 98–111)
Creatinine, Ser: 1.54 mg/dL — ABNORMAL HIGH (ref 0.44–1.00)
GFR calc Af Amer: 37 mL/min — ABNORMAL LOW (ref >60)
GFR calc non Af Amer: 32 mL/min — ABNORMAL LOW (ref >60)
Glucose, Bld: 199 mg/dL — ABNORMAL HIGH (ref 70–99)
Potassium: 4 mmol/L (ref 3.5–5.1)
Sodium: 138 mmol/L (ref 135–145)

## 2019-03-10 LAB — GLUCOSE, CAPILLARY
Glucose-Capillary: 169 mg/dL — ABNORMAL HIGH (ref 70–99)
Glucose-Capillary: 318 mg/dL — ABNORMAL HIGH (ref 70–99)
Glucose-Capillary: 254 mg/dL — ABNORMAL HIGH (ref 70–99)
Glucose-Capillary: 157 mg/dL — ABNORMAL HIGH (ref 70–99)

## 2019-03-10 LAB — CBC WITH DIFFERENTIAL/PLATELET
Abs Immature Granulocytes: 0.04 10*3/uL (ref 0.00–0.07)
Basophils Absolute: 0 10*3/uL (ref 0.0–0.1)
Basophils Relative: 0 %
Eosinophils Absolute: 0 10*3/uL (ref 0.0–0.5)
Eosinophils Relative: 0 %
HCT: 21.3 % — ABNORMAL LOW (ref 36.0–46.0)
Hemoglobin: 6.9 g/dL — CL (ref 12.0–15.0)
Immature Granulocytes: 1 %
Lymphocytes Relative: 26 %
Lymphs Abs: 0.8 10*3/uL (ref 0.7–4.0)
MCH: 30.3 pg (ref 26.0–34.0)
MCHC: 32.4 g/dL (ref 30.0–36.0)
MCV: 93.4 fL (ref 80.0–100.0)
Monocytes Absolute: 0.7 10*3/uL (ref 0.1–1.0)
Monocytes Relative: 22 %
Neutro Abs: 1.5 10*3/uL — ABNORMAL LOW (ref 1.7–7.7)
Neutrophils Relative %: 51 %
Platelets: 135 10*3/uL — ABNORMAL LOW (ref 150–400)
RBC: 2.28 MIL/uL — ABNORMAL LOW (ref 3.87–5.11)
RDW: 13.7 % (ref 11.5–15.5)
WBC: 3 10*3/uL — ABNORMAL LOW (ref 4.0–10.5)
nRBC: 0 % (ref 0.0–0.2)

## 2019-03-10 LAB — PATHOLOGIST SMEAR REVIEW

## 2019-03-10 MED ORDER — EPINEPHRINE PF 1 MG/ML IJ SOLN: 0.3000 mg | Freq: Once | INTRAMUSCULAR | Status: DC | PRN

## 2019-03-10 MED ORDER — FUROSEMIDE 10 MG/ML IJ SOLN: 20.0000 mg | Freq: Once | INTRAMUSCULAR | Status: DC

## 2019-03-10 MED ORDER — SODIUM CHLORIDE 0.9% IV SOLUTION: Freq: Once | INTRAVENOUS | Status: DC

## 2019-03-10 MED ORDER — DIPHENHYDRAMINE HCL 50 MG/ML IJ SOLN: 25.0000 mg | Freq: Once | INTRAMUSCULAR | Status: DC | PRN

## 2019-03-10 MED ORDER — ENSURE ENLIVE PO LIQD
237.0000 mL | Freq: Three times a day (TID) | ORAL | Status: DC
  Administered 2019-03-10 – 2019-03-14 (×13): 237 mL via ORAL
  Filled 2019-03-10: qty 711

## 2019-03-10 MED ORDER — EPINEPHRINE 0.3 MG/0.3ML IJ SOAJ
0.3000 mg | Freq: Once | INTRAMUSCULAR | Status: DC | PRN
  Filled 2019-03-10: qty 0.6

## 2019-03-10 MED ORDER — FUROSEMIDE 10 MG/ML IJ SOLN
20.0000 mg | Freq: Once | INTRAMUSCULAR | Status: DC
  Filled 2019-03-10: qty 2

## 2019-03-10 MED ORDER — ACETAMINOPHEN 325 MG PO TABS
650.0000 mg | ORAL_TABLET | Freq: Once | ORAL | Status: AC
  Administered 2019-03-11: 10:00:00 650 mg via ORAL

## 2019-03-10 MED ORDER — INSULIN GLARGINE 100 UNIT/ML ~~LOC~~ SOLN
10.0000 [IU] | Freq: Every day | SUBCUTANEOUS | Status: DC
  Administered 2019-03-10 – 2019-03-15 (×6): 10 [IU] via SUBCUTANEOUS
  Filled 2019-03-10 (×6): qty 0.1

## 2019-03-10 MED ORDER — SODIUM CHLORIDE 0.9% IV SOLUTION: Freq: Once | INTRAVENOUS | Status: AC

## 2019-03-10 MED ORDER — AMOXICILLIN 500 MG PO CAPS
500.0000 mg | ORAL_CAPSULE | Freq: Once | ORAL | Status: DC
  Filled 2019-03-10: qty 1

## 2019-03-10 NOTE — Progress Notes (Signed)
Physical Therapy Weekly Progress Note  Patient Details  Name: Debra Hopkins MRN: [id number] Date of Birth: 05/26/1940  Beginning of progress report period: March 03, 2019 End of progress report period: March 10, 2019  Today's Date: 03/10/2019 PT Individual Time: 2800-3491 PT Individual Time Calculation (min): 30 min   Patient has met 1 of 3 short term goals.  The patient is progressing slowly with therapy, limited by cognitive deficits and required surgical intervention yesterday for L thigh debridement. She current requires mod-min A for bed mobility and level slide board transfers, and max A +2 to stand with the [facility] and is currently unable to come to a full stand.   Patient continues to demonstrate the following deficits muscle weakness, decreased cardiorespiratoy endurance, decreased attention, decreased awareness, decreased problem solving, decreased safety awareness and decreased memory and decreased sitting balance, decreased standing balance, decreased postural control, decreased balance strategies and difficulty maintaining precautions and therefore will continue to benefit from skilled PT intervention to increase functional independence with mobility.  Patient progressing toward long term goals..  Continue plan of care.  PT Short Term Goals Week 1:  PT Short Term Goal 1 (Week 1): pt will transfer supine<>sit with min A PT Short Term Goal 1 - Progress (Week 1): Progressing toward goal PT Short Term Goal 2 (Week 1): pt will transfer bed<>chair with LRAD mod A PT Short Term Goal 2 - Progress (Week 1): Met PT Short Term Goal 3 (Week 1): pt will perform car transfer with LRAD mod A PT Short Term Goal 3 - Progress (Week 1): Progressing toward goal Week 2:  PT Short Term Goal 1 (Week 2): Patient will perform bed<>chair transfers with min A. PT Short Term Goal 2 (Week 2): Patient will perform bed mobility with min A. PT Short Term Goal 3 (Week 2): Patient will perform sit<>stand  with max A using LRAD.  Skilled Therapeutic Interventions/Progress Updates:     Patient in bed with RN in room upon PT arrival. Patient alert and agreeable to PT session. Patient denied pain, however, reported that she had significant pain last night. Patient with delayed responses this morning and only intermittently following simple one step commands. Patient positioned with HOB at 90 degrees in the bed and ate 3 small bites of french toast with syrup, 1 bite of oatmeal, 5-6 bites of apple sauce with morning medications, and drank 1/3 of an ensure. Provided cues for chewing and swallowing food completely, taking small sips, and performing a tongue sweep to be sure her mouth was clear of food. Patient required increased time to complete all tasks and max cues. RN in room throughout session and aware. Oriented patient to place and situation during session. Attempted to have patient perform bed level exercises after eating. Patient would not respond to cues for exercises and nodded yes when asked if she was tired. Patient in bed with RN in room at end of session with breaks locked, bed alarm set, and all needs within reach. Patient missed 30 min of skilled PT due to fatigue, will attempt to make up missed time as able.   Therapy Documentation Precautions:  Precautions Precautions: Fall Precaution Comments: R AKA NWB Restrictions Weight Bearing Restrictions: Yes RLE Weight Bearing: Non weight bearing General: PT Missed Treatment Reason: Patient fatigue   Therapy/Group: Individual Therapy  Taquanna Borras L Dolorez Jeffrey PT, DPT  03/10/2019, 9:41 AM

## 2019-03-10 NOTE — Progress Notes (Signed)
Speech Language Pathology Daily Session Note  Patient Details  Name: SIRINA YANDOW MRN: [id number] Date of Birth: 1940-11-28  Today's Date: 03/10/2019 SLP Individual Time: 1100-1130 SLP Individual Time Calculation (min): 30 min missed 30 minutes due to fatigue   Short Term Goals: Week 1: SLP Short Term Goal 1 (Week 1): Patient will demonstrate functional problem solving for basic and familair tasks with Min A verbal cues. SLP Short Term Goal 2 (Week 1): Patient will recall new, daily information with use of external aids with Mod A verbal and visual cues. SLP Short Term Goal 3 (Week 1): Patient will self-monitor and correct errors during functional tasks with Min A verbal cues. SLP Short Term Goal 4 (Week 1): Patient will demonstrate sustained attention to tasks for ~10 minutes with Min A verbal cues for redirection.  Skilled Therapeutic Interventions: Skilled ST services focused on cognitive skills. Pt was laying in bed partial dressed with no covers on upon entering room. Pt expressed feeling hot "might be hot flashes." SLP notified NT and temperature was take reading 99.9. During SLP treatment lab drew blood from port and nurse provided medication and was beginning to set up IV for antibiotics. Pt denied having therapy services today (despite seeing PT for 30 minutes.) Pt was unable to read entry in memory notebook, despite max A verbal/visual cues due to fading attention. Pt stated " I cant focus." SLP attempted to have pt read large medication labels, however unable to complete task due to reduced attention. Pt missed 30 minutes of treatment time due to fatigue and inability to participate in services. SLP will attempt to make up time if time allows. Pt was left in room with call bell within reach and bed/chair alarm set. ST recommends to continue skilled ST services.      Pain Pain Assessment Pain Scale: Faces Faces Pain Scale: No hurt Pain Type: Acute pain Pain Location: Back Pain  Orientation: Lower Pain Descriptors / Indicators: Aching Pain Intervention(s): Repositioned;RN made aware  Therapy/Group: Individual Therapy  Edina Winningham  [facility] 03/10/2019, 11:45 AM

## 2019-03-10 NOTE — Progress Notes (Signed)
Occupational Therapy Session Note  Patient Details  Name: Debra Hopkins MRN: [id number] Date of Birth: May 15, 1940  Today's Date: 03/10/2019 OT Individual Time: 1256-1316 OT Individual Time Calculation (min): 20 min  Missed 55 minutes of therapy session due to low BP and nursing beginning blood transfusion   Short Term Goals: Week 1:  OT Short Term Goal 1 (Week 1): Pt will don pants with mod A OT Short Term Goal 2 (Week 1): Pt will complete transfer to [facility] with max +1 OT Short Term Goal 3 (Week 1): Pt will propel w/c 50 ft with min A to increase home accessibility  Skilled Therapeutic Interventions/Progress Updates:    patient in side lying in bed, alert and pleasant.  She states that she is tired but needs to go to the bathroom.  Assisted onto bed pan max A due to medical status.  She was unable to urinate but brief was wet.  Dependent to complete hygiene and change brief.  Repositioned in bed with 2nd person assist in side lying left side.  Unable to complete other therapeutic intervention at this time due to medical status.  Bed alarm set and nursing present.  Will attempt further therapy as appropriate.    Therapy Documentation Precautions:  Precautions Precautions: Fall Precaution Comments: R AKA NWB Restrictions Weight Bearing Restrictions: Yes RLE Weight Bearing: Non weight bearing General: General OT Amount of Missed Time: 59 Minutes PT Missed Treatment Reason: Patient fatigue Vital Signs: Therapy Vitals Temp: 98.6 F (37 C) Pulse Rate: 72 Resp: 14 BP: (!) 84/43 Patient Position (if appropriate): Lying Oxygen Therapy SpO2: 94 % O2 Device: Room Air Pain: Pain Assessment Pain Scale: 0-10 Pain Score: 6  Pain Type: Acute pain;Surgical pain Pain Location: Groin Pain Orientation: Right Pain Descriptors / Indicators: Discomfort Pain Intervention(s): Repositioned ADL: ADL Eating: Set up Where Assessed-Eating: Bed level Grooming: Setup Where Assessed-Grooming: Edge  of bed Upper Body Bathing: Supervision/safety Where Assessed-Upper Body Bathing: Edge of bed Lower Body Bathing: Moderate assistance Where Assessed-Lower Body Bathing: Edge of bed Upper Body Dressing: Supervision/safety Where Assessed-Upper Body Dressing: Edge of bed Lower Body Dressing: Maximal assistance Where Assessed-Lower Body Dressing: Bed level Toileting: Dependent Where Assessed-Toileting: Bedside Commode Toilet Transfer: Maximal assistance Toilet Transfer Method: Theatre manager: Bedside commode Vision   Perception    Praxis   Exercises:   Other Treatments:     Therapy/Group: Individual Therapy  Carlos Levering 03/10/2019, 1:19 PM

## 2019-03-10 NOTE — Progress Notes (Signed)
[facility] PHYSICAL MEDICINE & REHABILITATION PROGRESS NOTE   Subjective/Complaints:   Pt reports pain is doing "ok". Is very sleepy- Hb 6.9 and could be cause of fatigue/lethargy.  Denies any rash from ABX. Getting 2 units pRBCs today ue to low Hb.  ROS: Patient denies CP, shortness of breath, nausea, vomiting or diarrhea.  Objective:   No results found. Recent Labs    03/08/19 0824 03/10/19 0453  WBC 1.9* 3.0*  HGB 7.1* 6.9*  HCT 22.6* 21.3*  PLT 113* 135*   Recent Labs    03/08/19 0824 03/10/19 0453  NA 141 138  K 4.4 4.0  CL 104 102  CO2 28 26  GLUCOSE 161* 199*  BUN 46* 48*  CREATININE 1.39* 1.54*  CALCIUM 9.1 9.0    Intake/Output Summary (Last 24 hours) at 03/10/2019 0913 Last data filed at 03/10/2019 0700 Gross per 24 hour  Intake 1730 ml  Output 225 ml  Net 1505 ml     Physical Exam: Vital Signs Blood pressure (!) 140/47, pulse 97, temperature 98.1 F (36.7 C), temperature source Oral, resp. rate 14, height 5\' 3"  (1.6 m), weight 73.3 kg, SpO2 98 %.  Constitutional: No distress . Vital signs reviewed. alseep- wakes to stimuli; very pale, NAD HENT: Normocephalic.  Atraumatic. Eyes: EOMI. No discharge. Cardiovascular: No JVD. Respiratory: Normal effort.  No stridor. GI: Non-distended. Skin: + Pale. No signs of rash from ABX Right AKA with dressing C/D/I Psych: flat affect Musc: Right AKA with edema and tenderness Neuro: Lethargic Motor: Bilateral upper extremities: Appear to be 4+/5 proximal distal Left lower extremity: Appears to be 3/5 hip flexion, knee extension and 4/5 ankle dorsiflexion Right lower extremity: Limited due to pain  Assessment/Plan: 1. 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  Physiatrist is providing close team supervision and 24 hour management of active medical problems  listed below.  Physiatrist and rehab team continue to assess barriers to discharge/monitor patient progress toward functional and medical goals  Care Tool:  Bathing    Body parts bathed by patient: Right arm, Left arm, Chest, Face   Body parts bathed by helper: Abdomen, Front perineal area, Buttocks, Right upper leg, Left upper leg, Left lower leg Body parts n/a: Right lower leg   Bathing assist Assist Level: 2 Helpers     Upper Body Dressing/Undressing Upper body dressing   What is the patient wearing?: Hospital gown only    Upper body assist Assist Level: Moderate Assistance - Patient 50 - 74%    Lower Body Dressing/Undressing Lower body dressing      What is the patient wearing?: Hospital gown only     Lower body assist Assist for lower body dressing: Moderate Assistance - Patient 50 - 74%     Toileting Toileting    Toileting assist Assist for toileting: 2 Helpers     Transfers Chair/bed transfer  Transfers assist  Chair/bed transfer activity did not occur: Safety/medical concerns(Pt with increased pain and LLE weakness limiting participation in therapy)  Chair/bed transfer assist level: Minimal Assistance - Patient > 75%     Locomotion Ambulation   Ambulation assist   Ambulation activity did not occur: Safety/medical concerns(Pt with increased pain and LLE weakness limiting participation in therapy)          Walk 10 feet activity   Assist  Walk 10 feet activity did not occur: Safety/medical concerns(Pt with  increased pain and LLE weakness limiting participation in therapy)        Walk 50 feet activity   Assist Walk 50 feet with 2 turns activity did not occur: Safety/medical concerns(Pt with increased pain and LLE weakness limiting participation in therapy)         Walk 150 feet activity   Assist Walk 150 feet activity did not occur: Safety/medical concerns(Pt with increased pain and LLE weakness limiting participation in therapy)          Walk 10 feet on uneven surface  activity   Assist Walk 10 feet on uneven surfaces activity did not occur: Safety/medical concerns(Pt with increased pain and LLE weakness limiting participation in therapy)         Wheelchair     Assist Will patient use wheelchair at discharge?: Yes Type of Wheelchair: Manual Wheelchair activity did not occur: Safety/medical concerns(Pt with increased pain and LLE weakness limiting participation in therapy)         Wheelchair 50 feet with 2 turns activity    Assist    Wheelchair 50 feet with 2 turns activity did not occur: Safety/medical concerns(Pt with increased pain and LLE weakness limiting participation in therapy)       Wheelchair 150 feet activity     Assist  Wheelchair 150 feet activity did not occur: Safety/medical concerns(Pt with increased pain and LLE weakness limiting participation in therapy)       Blood pressure (!) 140/47, pulse 97, temperature 98.1 F (36.7 C), temperature source Oral, resp. rate 14, height 5\' 3"  (1.6 m), weight 73.3 kg, SpO2 98 %.  1. 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 Vancomycin resumed for wound coverage 03/01/2019   Continue CIR 2. Antithrombotics:  -DVT/anticoagulation: Subcutaneous heparin- suggest Doppler of LLE due to severe pain of LLE   12/23- Dopplers of LLE (-) -antiplatelet therapy: Aspirin 81 mg daily  3. Pain Management: Lyrica 25 mg 3 times daily as needed, oxycodone as needed   lidoderm patches for L calf and coccyx; added skelaxin for muscle pain prn; and made lyrica scheduled- probably need to increase in 2-3 days.  Tylenol scheduled 4. Mood: Ego support -antipsychotic agents: N/A  5. Neuropsych: This patient is capable of making decisions on her own behalf?.  6. Skin/Wound Care: Routine skin checks   Appreciate vascular Recs-right groin with fat necrosis and serous drainage  Will likely require groin debridement and possible VAC placement this week.   12/29- OR today  12/30- has wound VAC- saw PA form vascular- she noted no changes currently 7. Fluids/Electrolytes/Nutrition: Routine in and outs. CMP ordered.  8. Acute blood loss anemia. CBC stable on 12/24, with decreased Hgb on 12/25, will repeat tomorrow.    12/26: Hgb with increase.  12/29- Hb down to 7.1 from 7.8- with OR today, will need transfusion.   12/30- 2 units pRBCs today 9. History of TIA. Continue aspirin  10. Hypertension. Clonidine 0.2 mg nightly,Cozaar 100 mg daily, Demadex 20 mg daily, Procardia XL 30 mg daily. Monitor with increased mobility.   hydralazine 25 mg nightly increased to twice daily on 12/25, changed back to nightly on 12/27  Controlled on 12/28 11. Hypothyroidism. Synthroid  12. Diabetes mellitus peripheral neuropathy. Hemoglobin A1c 5.9. Lantus insulin 15 units nightly. Check blood sugars before meals and at bedtime. Monitor with increased mobility.  CBG (last 3)  Recent Labs    03/09/19 1801 03/09/19 2118 03/10/19 [id number]  GLUCAP 180* 162* 169*   Stable/little elevated on 12/30  13. Hyperlipidemia. Zocor  14. Constipation. Laxative assistance  15. GERD. Protonix  16. Thrombocytopenia-   Platelets 96 on 12/26, labs pending for today  12/29- Plts slightly better at 113k 17. Leukopenia  WBCs 2.4 on 12/26, labs pending for today  12/29- WBC 1.9  12/30- up to 3k 18. Anemia-   Hemoglobin 7.8 on 12/26, labs pending for today-may require transfusion given multiple associated sequela  12/29- Hb down to 7.1- will reorder for tomorrow, -likely will need transfusion.   12/30- transfusion today 2 units 19. AKI-   Creatinine 1.15 on 12/26, labs pending for today   12/29- Cr 1.39- likely due to Vanc  12/30- Cr up to 1.54- on Ancef- could be due ot NPO status all yesterday. 20. Infection?- Vanc restarted due to increased drainage from groin:  Continue IV Vanco, critical value on  12/27  12/30 changed to Ancef yesterday- will monitor- is 2g q12 hours per pharmacy 21. Hypokalemia:   Potassium 3.9 on 12/26, continue to monitor  12/29- K+ 4.4 22.  Sleep disturbance  Trazodone 100 nightly-consider decreasing dose if persistently lethargic in a.m. 23.  Pancytopenia  ?  Improving, labs pending for today  12/29-a little worse- will recheck in AM- could be due to Vanc   12/30- WBC up to 3.0 and plts up to 130k- could have been Vanc  LOS: 8 days A FACE TO FACE EVALUATION WAS PERFORMED  Aamina Skiff 03/10/2019, 9:13 AM

## 2019-03-10 NOTE — Progress Notes (Signed)
Two units of blood transfused per order, pt. Tolerated it well, no adverse reaction noted, BP was low during transfusion, PA notified but no new order given. We continue to monitor.

## 2019-03-10 NOTE — Progress Notes (Signed)
Social Work Assessment and Plan   Patient Details  Name: Debra Hopkins MRN: [id number] Date of Birth: [birth year]  Today's Date: 03/04/2019  Problem List:  Patient Active Problem List   Diagnosis Date Noted  . AKI (acute kidney injury) ([place])   . Leukopenia   . Acute blood loss anemia   . Sleep disturbance   . Diabetic peripheral neuropathy ([place])   . Labile blood glucose   . Pressure injury of skin 03/03/2019  . S/P AKA (above knee amputation) unilateral, right ([place]) 03/02/2019  . Right below-knee amputee ([place]) 03/02/2019  . PAD (peripheral artery disease) ([place]) 02/15/2019  . Type 2 diabetes mellitus with stage 3 chronic kidney disease, without long-term current use of insulin ([place]) 10/16/2015  . Obesity 10/16/2015  . Anemia 02/21/2015  . GERD (gastroesophageal reflux disease) 02/08/2014  . Cough 02/08/2014  . Dysphagia, unspecified(787.20) 09/27/2013  . Fibromyalgia 09/27/2013  . Hypothyroidism 09/27/2013  . Aftercare following surgery of the circulatory system, [place] 02/25/2012  . Occlusion and stenosis of carotid artery without mention of cerebral infarction 02/19/2011  . CAD 07/31/2009  . Mixed hyperlipidemia 11/22/2008  . Essential hypertension, benign 11/22/2008  . PVD 11/22/2008  . DYSPNEA 11/22/2008   Past Medical History:  Past Medical History:  Diagnosis Date  . Arthritis   . CAD (coronary artery disease)   . Cancer ([place])   . Carotid artery occlusion   . Cataracts, bilateral   . DDD (degenerative disc disease)   . Diabetes mellitus    Type 2  . Diverticulitis   . Dyspnea   . Fibromyalgia   . GERD (gastroesophageal reflux disease)   . Heart murmur   . Hemorrhoids   . History of shingles   . Impaired hearing   . Macular degeneration   . Other and unspecified hyperlipidemia   . PVD (peripheral vascular disease) ([place])   . TIA (transient ischemic attack)   . Unspecified essential hypertension   . Varicose veins    Past Surgical History:  Past Surgical  History:  Procedure Laterality Date  . ABDOMINAL AORTOGRAM W/LOWER EXTREMITY Bilateral 02/15/2019   Procedure: ABDOMINAL AORTOGRAM W/LOWER EXTREMITY;  Surgeon: Waynetta Sandy, MD;  Location: [facility];  Service: Cardiovascular;  Laterality: Bilateral;  . AMPUTATION Right 02/22/2019   Procedure: RIGHT ABOVE KNEE AMPUTATION;  Surgeon: Rosetta Posner, MD;  Location: [facility];  Service: Vascular;  Laterality: Right;  . ANGIOPLASTY     left leg 2003  . APPENDECTOMY    . APPLICATION OF WOUND VAC Right 03/09/2019   Procedure: APPLICATION OF WOUND VAC RIGHT GROIN;  Surgeon: Waynetta Sandy, MD;  Location: [facility];  Service: Vascular;  Laterality: Right;  . aspiration of a cyst in the right breast    . BALLOON DILATION N/A 10/01/2013   Procedure: BALLOON DILATION;  Surgeon: Rogene Houston, MD;  [facility];  Service: Endoscopy;  Laterality: N/A;  . BREAST BIOPSY    . BREAST BIOPSY Left   . CARDIAC CATHETERIZATION    . CAROTID ENDARTERECTOMY  12-03-06   left CEA  . CATARACT EXTRACTION W/PHACO Right 10/14/2013   Procedure: CATARACT EXTRACTION PHACO AND INTRAOCULAR LENS PLACEMENT (IOC);  Surgeon: Tonny Branch, MD;  Location: [facility];  Service: Ophthalmology;  Laterality: Right;  CDE 7.20  . CATARACT EXTRACTION W/PHACO Left 11/04/2013   Procedure: CATARACT EXTRACTION PHACO AND INTRAOCULAR LENS PLACEMENT (IOC);  Surgeon: Tonny Branch, MD;  Location: [facility];  Service: Ophthalmology;  Laterality: Left;  CDE 8.57  . COLONOSCOPY N/A 09/19/2014   Procedure: COLONOSCOPY;  Surgeon: Rogene Houston, MD;  [facility];  Service: Endoscopy;  Laterality: N/A;  7:30-8:30  . CYSTO WITH HYDRODISTENSION N/A 03/29/2016   Procedure: CYSTOSCOPY/HYDRODISTENSION;  Surgeon: Irine Seal, MD;  Location[facility];  Service: Urology;  Laterality: N/A;  . CYSTOSCOPY WITH INJECTION N/A 03/29/2016   Procedure: CYSTOSCOPY WITH PYRIDIUM AND MARCAINE INSTALLATION;  Surgeon: Irine Seal, MD;  Location:  [facility];  Service: Urology;  Laterality: N/A;  . ESOPHAGOGASTRODUODENOSCOPY N/A 10/01/2013   Procedure: ESOPHAGOGASTRODUODENOSCOPY (EGD);  Surgeon: Rogene Houston, MD;  [facility];  Service: Endoscopy;  Laterality: N/A;  120  . ESOPHAGOGASTRODUODENOSCOPY N/A 10/24/2014   Procedure: ESOPHAGOGASTRODUODENOSCOPY (EGD);  Surgeon: Rogene Houston, MD;  [facility];  Service: Endoscopy;  Laterality: N/A;  730  . FALSE ANEURYSM REPAIR Left 02/16/2019   Procedure: REPAIR LEFT FEMORAL PSEUDOANEURYSM;  Surgeon: Waynetta Sandy, MD;  Location: [facility];  Service: Vascular;  Laterality: Left;  . FEMORAL-POPLITEAL BYPASS GRAFT Right 02/16/2019   Procedure: RIGHT BYPASS GRAFT FEMORAL-POPLITEAL ARTERY USING NON-REVERSED RIGHT GREATER SAPHENOUS VEIN;  Surgeon: Waynetta Sandy, MD;  Location: [facility];  Service: Vascular;  Laterality: Right;  . GIVENS CAPSULE STUDY N/A 10/24/2014   Procedure: GIVENS CAPSULE STUDY;  Surgeon: Rogene Houston, MD;  [facility];  Service: Endoscopy;  Laterality: N/A;  . JOINT REPLACEMENT  2011   Right total knee replacement  . [place]  . KNEE ARTHROSCOPY  2009  . LOWER EXTREMITY ANGIOGRAM Right 02/16/2019   Procedure: Lower Extremity Angiogram;  Surgeon: Waynetta Sandy, MD;  Location: [facility];  Service: Vascular;  Laterality: Right;  . MALONEY DILATION N/A 10/01/2013   Procedure: Venia Minks DILATION;  Surgeon: Rogene Houston, MD;  [facility];  Service: Endoscopy;  Laterality: N/A;  . PERIPHERAL VASCULAR INTERVENTION Right 02/15/2019   Procedure: PERIPHERAL VASCULAR INTERVENTION;  Surgeon: Waynetta Sandy, MD;  Location: [facility];  Service: Cardiovascular;  Laterality: Right;  external iliac, SFA, popliteal  . SAVORY DILATION N/A 10/01/2013   Procedure: SAVORY DILATION;  Surgeon: Rogene Houston, MD;  [facility];  Service: Endoscopy;  Laterality: N/A;  . SPINE SURGERY   1997   C5-c6 fusion  . TOTAL ABDOMINAL HYSTERECTOMY  1973  . WOUND DEBRIDEMENT Right 03/09/2019   Procedure: DEBRIDEMENT WOUND RIGHT GROIN;  Surgeon: Waynetta Sandy, MD;  Location: [facility];  Service: Vascular;  Laterality: Right;   Social History:  reports that she quit smoking about 34 years ago. Her smoking use included cigarettes. She has never used smokeless tobacco. She reports that she does not drink alcohol or use drugs.  Family / Support Systems Marital Status: Widow/Widower Patient Roles: Parent, Other (Comment)(sister) Children: son, Jahni Medcalf @ [phone] (lives in [place] and working in [place]) Other Supports: sister, Enis Gash [place]) @ [phone] Anticipated Caregiver: son, Judene Gahn Ability/Limitations of Caregiver: son does work, however, per [facility] is will make sure pt has 24/7 support "whatever she needs" Caregiver Availability: 24/7 Family Dynamics: Pt notes her son is very supportive and that sister may also assist.  Social History Preferred language: English Religion: Christian Cultural Background: None Read: Yes Write: Yes Employment Status: Retired Public relations account executive Issues: none Guardian/Conservator: none - per MD, pt is capable of making decisions on her own behalf.   Abuse/Neglect Abuse/Neglect Assessment Can Be Completed: Yes Physical Abuse: Denies Verbal  Abuse: Denies Sexual Abuse: Denies Exploitation of patient/patient's resources: Denies Self-Neglect: Denies  Emotional Status Pt's affect, behavior and adjustment status: Pt very pleasant and able to complete assessment interview without difficulty.  She does become tearful when she talks about her family and support she will need.  She is concerned about being "a burden" on son.  Per therapies, she has been tearful at times.  Will refer for neuropsychology for additional support. Recent Psychosocial Issues: None Psychiatric History: None reported Substance Abuse  History: None reported  Patient / Family Perceptions, Expectations & Goals Pt/Family understanding of illness & functional limitations: Pt and family with good understanding of medical issues, current functional limitations/ need for CIR. Premorbid pt/family roles/activities: Pt was independent overall, driving but limited community level due to COVID. Anticipated changes in roles/activities/participation: Son will need to be primary support person. Pt/family expectations/goals: "I just want to be able to do as much as I can for myself."  [employer]: None Premorbid Home Care/DME Agencies: None Transportation available at discharge: yes Resource referrals recommended: Neuropsychology  Discharge Planning Living Arrangements: Alone Support Systems: Children, Other relatives Type of Residence: Private residence Insurance Resources: [employer] Financial Resources: [place] Referred: No Living Expenses: Own Money Management: Patient Does the patient have any problems obtaining your medications?: No Home Management: pt Patient/Family Preliminary Plans: Pt to d/c home with son to arrange 24/7 support. Social Work Anticipated Follow Up Needs: HH/OP Expected length of stay: 3-4 weeks  Clinical Impression Pleasant woman here following AKA.  Living alone but son to arrange any needed support. She is tearful at times during interview and occasionally in therapies - will have neuropsychology consult.  Will follow for support and d/c planning needs.  Mayli Covington 03/04/2019, 9:26 AM

## 2019-03-10 NOTE — Progress Notes (Signed)
Administer ANCEF via IV access. Tolerated administration , no observed reaction. Will continue to monitor.

## 2019-03-10 NOTE — Plan of Care (Signed)
Patient continue to be incontinent 

## 2019-03-10 NOTE — Care Management (Signed)
[facility] Individual Statement of Services  Patient Name:  Debra Hopkins  Date:  03/08/2019  Welcome to the [facility].  Our goal is to provide you with an individualized program based on your diagnosis and situation, designed to meet your specific needs.  With this comprehensive rehabilitation program, you will be expected to participate in at least 3 hours of rehabilitation therapies Monday-Friday, with modified therapy programming on the weekends.  Your rehabilitation program will include the following services:  Physical Therapy (PT), Occupational Therapy (OT), 24 hour per day rehabilitation nursing, Neuropsychology, Case Management (Social Worker), Rehabilitation Medicine, Nutrition Services and Pharmacy Services  Weekly team conferences will be held on Tuesdays to discuss your progress.  Your Social Worker will talk with you frequently to get your input and to update you on team discussions.  Team conferences with you and your family in attendance may also be held.  Expected length of stay: 3-4 weeks   Overall anticipated outcome: supervision overall  Depending on your progress and recovery, your program may change. Your Social Worker will coordinate services and will keep you informed of any changes. Your Social Worker's name and contact numbers are listed  below.  The following services may also be recommended but are not provided by the [facility] will be made to provide these services after discharge if needed.  Arrangements include referral to agencies that provide these services.  Your insurance has been verified to be:  [facility] Medicare Your primary doctor is:  Manuella Ghazi  Pertinent information will be shared with your doctor and your insurance company.  Social Worker:  [name], [place] or ([phone]   Information discussed with and copy given to patient by: Lennart Pall, 03/08/2019, 9:32 AM

## 2019-03-10 NOTE — Progress Notes (Signed)
Lab called for Hemoglobin critical value which is 6.9. Silvestre Mesi, PA notified, provided verbal order to infuse 2 units of PRBC, give 20 mg of Lasix in between units, obtain CBC tomorrow morning.

## 2019-03-10 NOTE — Progress Notes (Signed)
Nutrition Follow-up  DOCUMENTATION CODES:   Not applicable  INTERVENTION:   - Liberalize diet to Regular, verbal with readback order placed per MD  - Increase Ensure Enlive po to TID, each supplement provides 350 kcal and 20 grams of protein  - Continue Pro-stat 30 ml po BID, each supplement provides 100 kcal and 15 grams of protein  - Continue MVI with minerals daily  - Add gravy to meats with meals   - Encourage adequate PO intake  NUTRITION DIAGNOSIS:   Increased nutrient needs related to wound healing as evidenced by estimated needs.  Ongoing, being addressed via supplements  GOAL:   Patient will meet greater than or equal to 90% of their needs  Progressing  MONITOR:   PO intake, Supplement acceptance, Labs, Weight trends, Skin  REASON FOR ASSESSMENT:   Malnutrition Screening Tool    ASSESSMENT:   78 year old female with PMH of CAD, DM, TIA, fibromyalgia, macular degeneration, PVD with multiple revascularization procedures. Presented on 02/15/19 with chronic RLE ischemic changes and toe ulceration. Recent aortogram showed aortic and iliac segments heavily calcified. 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 Pt admitted to CIR on 03/02/19.  12/29 - s/p I&D of right groin, placement of wound VAC  Pt back on Heart Healthy/Carb Modified diet after trip to OR yesterday. Per MD, okay to liberalize diet back to Regular.  Weight up 6 lbs since admit. Will continue to monitor trends.  Spoke with pt at bedside. Pt resting quietly at time of RD visit. Pt reports that she has not had any issues with her appetite. Pt  reports that she did not eat at all yesterday (noted pt was NPO for OR) but that she did eat breakfast this morning. Pt cannot recall what she ate for breakfast this morning.  When asked if she is drinking the Ensure Enlive shakes, pt states that she is and prefers the vanilla flavor. Pt asking how many she should be drinking a day and states she thinks she can drink 3 a day. RD to increase order from BID to TID.  Pt denies any N/V or abdominal pain or bloating. Pt reports that she occasionally has some soreness with swallowing that is worse when she eats "hard foods." RD to order meats with gravy to aid in swallowing and add additional kcal and protein.  Pt accepting Pro-stat supplements per [facility] documentation.  Meal Completion: 0-100% (variable)  Medications reviewed and include: vitamin C, colace, Ensure Enlive BID, Pro-stat 30 ml BID, Lasix, SSI, MVI with minerals, protonix, torsemide, zinc sulfate, IV abx  Labs reviewed: BUN 48, creatinine 1.54, hemoglobin 6.9 CBG's: 112-180 x 24 hours  VAC: 175 ml x 24 hours  NUTRITION - FOCUSED PHYSICAL EXAM:    Most Recent Value  Orbital Region  Mild depletion  Upper Arm Region  No depletion  Thoracic and Lumbar Region  No depletion  Buccal Region  No depletion  Temple Region  Mild depletion  Clavicle Bone Region  Mild depletion  Clavicle and Acromion Bone Region  Moderate depletion  Scapular Bone Region  Unable to assess  Dorsal Hand  Moderate depletion  Patellar Region  Mild depletion  Anterior Thigh  Region  Moderate depletion  Posterior Calf Region  Moderate depletion  Edema (RD Assessment)  None  Hair  Reviewed  Eyes  Reviewed  Mouth  Reviewed  Skin  Reviewed  Nails  Reviewed       Diet Order:   Diet Order            Diet regular Room service appropriate? Yes; Fluid consistency: Thin  Diet effective now              EDUCATION NEEDS:   Education needs have been addressed  Skin:  Skin Assessment: Skin Integrity  Issues: Stage II: bilateral buttocks, bilateral coccyx Wound VAC: right groin Incisions: thigh, groin  Last BM:  03/06/19  Height:   Ht Readings from Last 1 Encounters:  03/09/19 5\' 3"  (1.6 m)    Weight:   Wt Readings from Last 1 Encounters:  03/09/19 73.3 kg    Ideal Body Weight:  48.1 kg (adjusted for AKA)  BMI:  Body mass index is 28.63 kg/m.  Estimated Nutritional Needs:   Kcal:  1600-1800  Protein:  80-95 grams  Fluid:  1.6-1.8 L    Gaynell Face, MS, RD, LDN Inpatient Clinical Dietitian Pager: [phone] Weekend/After Hours: [phone]

## 2019-03-10 NOTE — Progress Notes (Addendum)
Progress Note    03/10/2019 9:16 AM 1 Day Post-Op  Subjective: Underwent right groin debridement with myofascial flap and VAC placement yesterday.  This morning she is lethargic, answers questions, not following commands.  Speech is clear.  She denies pain.    Vitals:   03/10/19 0754 03/10/19 0755  BP:    Pulse:    Resp:    Temp: (!) 102.9 F (39.4 C) 98.1 F (36.7 C)  SpO2:      Physical Exam: Cardiac: Rhythm and rate are regular Lungs: Clear to auscultation bilaterally Incisions: Right groin wound VAC dressing in place with good seal.  Drainage is dark red and clear in canister  extremities:  Right AKA incision is well approximated and healing without signs of infection. Flaps are warm. Mild edema. Right proximal thigh and left groin incisions are continuing to heal without signs of infection Abdomen:  soft Skin: pale,warm, not diaphoretic  CBC    Component Value Date/Time   WBC 3.0 (L) 03/10/2019 0453   RBC 2.28 (L) 03/10/2019 0453   HGB 6.9 (LL) 03/10/2019 0453   HCT 21.3 (L) 03/10/2019 0453   PLT 135 (L) 03/10/2019 0453   MCV 93.4 03/10/2019 0453   MCH 30.3 03/10/2019 0453   MCHC 32.4 03/10/2019 0453   RDW 13.7 03/10/2019 0453   LYMPHSABS 0.8 03/10/2019 0453   MONOABS 0.7 03/10/2019 0453   EOSABS 0.0 03/10/2019 0453   BASOSABS 0.0 03/10/2019 0453    BMET    Component Value Date/Time   NA 138 03/10/2019 0453   NA 137 11/04/2018 1132   K 4.0 03/10/2019 0453   CL 102 03/10/2019 0453   CO2 26 03/10/2019 0453   GLUCOSE 199 (H) 03/10/2019 0453   BUN 48 (H) 03/10/2019 0453   BUN 24 11/04/2018 1132   CREATININE 1.54 (H) 03/10/2019 0453   CALCIUM 9.0 03/10/2019 0453   GFRNONAA 32 (L) 03/10/2019 0453   GFRAA 37 (L) 03/10/2019 0453     Intake/Output Summary (Last 24 hours) at 03/10/2019 0916 Last data filed at 03/10/2019 0700 Gross per 24 hour  Intake 1730 ml  Output 225 ml  Net 1505 ml    HOSPITAL MEDICATIONS Scheduled Meds: . sodium  chloride   Intravenous Once  . sodium chloride   Intravenous Once  . acetaminophen  650 mg Oral TID  . acetaminophen  650 mg Oral Once  . vitamin C  500 mg Oral BID  . aspirin EC  81 mg Oral Daily  . Chlorhexidine Gluconate Cloth  6 each Topical Daily  . cloNIDine  0.2 mg Oral QHS  . docusate sodium  100 mg Oral Daily  . feeding supplement (ENSURE ENLIVE)  237 mL Oral BID BM  . feeding supplement (PRO-STAT SUGAR FREE 64)  30 mL Oral BID  . furosemide  20 mg Intravenous Once  . furosemide  20 mg Intravenous Once  . heparin  5,000 Units Subcutaneous Q8H  . hydrALAZINE  25 mg Oral QHS  . insulin aspart  0-9 Units Subcutaneous TID AC & HS  . levothyroxine  50 mcg Oral QAC breakfast  . lidocaine  2 patch Transdermal Q24H  . losartan  100 mg Oral Daily  . multivitamin with minerals  1 tablet Oral Daily  . NIFEdipine  30 mg Oral Daily  . pantoprazole  40 mg Oral Daily  . pneumococcal 23 valent vaccine  0.5 mL Intramuscular Tomorrow-1000  . pregabalin  25 mg Oral TID  . simvastatin  40 mg Oral  q1800  . torsemide  20 mg Oral Daily  . zinc sulfate  220 mg Oral Daily   Continuous Infusions: .  ceFAZolin (ANCEF) IV 2 g (03/09/19 2350)   PRN Meds:.bisacodyl, guaiFENesin-dextromethorphan, metaxalone, oxyCODONE, polyethylene glycol, sodium chloride flush, sorbitol  Assessment:  78 y.o. 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  Right groin fate necrosis requiring debridement and myofascial flap closure. Febrile.    Plan: -Receiving Ancef.  2 unit PRBCs transfusion pending -DVT prophylaxis:  heparin   Risa Grill, PA-C [facility] [phone] 03/10/2019  9:16 AM   I have independently interviewed and examined patient and agree with PA assessment and plan above. Plan for wound vac change tomorrow at bedside.    Robinn Overholt C. [name], MD [facility] [place] Office: [phone] Pager: [phone]

## 2019-03-10 NOTE — Progress Notes (Signed)
Transfusion orders placed by Linna Hoff, PA per standing orders still need consent.

## 2019-03-11 ENCOUNTER — Inpatient Hospital Stay (HOSPITAL_COMMUNITY): Payer: Medicare Other | Admitting: Speech Pathology

## 2019-03-11 ENCOUNTER — Inpatient Hospital Stay (HOSPITAL_COMMUNITY): Payer: Medicare Other | Admitting: Physical Therapy

## 2019-03-11 ENCOUNTER — Inpatient Hospital Stay (HOSPITAL_COMMUNITY): Payer: Medicare Other

## 2019-03-11 LAB — TYPE AND SCREEN
ABO/RH(D): A POS
Antibody Screen: NEGATIVE
Unit division: 0
Unit division: 0

## 2019-03-11 LAB — CBC
HCT: 25.3 % — ABNORMAL LOW (ref 36.0–46.0)
Hemoglobin: 8.4 g/dL — ABNORMAL LOW (ref 12.0–15.0)
MCH: 30.9 pg (ref 26.0–34.0)
MCHC: 33.2 g/dL (ref 30.0–36.0)
MCV: 93 fL (ref 80.0–100.0)
Platelets: 110 10*3/uL — ABNORMAL LOW (ref 150–400)
RBC: 2.72 MIL/uL — ABNORMAL LOW (ref 3.87–5.11)
RDW: 14.6 % (ref 11.5–15.5)
WBC: 4.5 10*3/uL (ref 4.0–10.5)
nRBC: 0 % (ref 0.0–0.2)

## 2019-03-11 LAB — BPAM RBC
Blood Product Expiration Date: 202101222359
Blood Product Expiration Date: 202101222359
ISSUE DATE / TIME: 202012301311
ISSUE DATE / TIME: 202012301522
Unit Type and Rh: 6200
Unit Type and Rh: 6200

## 2019-03-11 LAB — GLUCOSE, CAPILLARY
Glucose-Capillary: 177 mg/dL — ABNORMAL HIGH (ref 70–99)
Glucose-Capillary: 190 mg/dL — ABNORMAL HIGH (ref 70–99)
Glucose-Capillary: 196 mg/dL — ABNORMAL HIGH (ref 70–99)
Glucose-Capillary: 176 mg/dL — ABNORMAL HIGH (ref 70–99)

## 2019-03-11 MED ORDER — HYDROCERIN EX CREA
TOPICAL_CREAM | Freq: Two times a day (BID) | CUTANEOUS | Status: DC
  Filled 2019-03-11: qty 113

## 2019-03-11 MED ORDER — AMOXICILLIN 500 MG PO CAPS
500.0000 mg | ORAL_CAPSULE | Freq: Once | ORAL | Status: AC
  Administered 2019-03-11: 500 mg via ORAL
  Filled 2019-03-11: qty 1

## 2019-03-11 MED ORDER — GERHARDT'S BUTT CREAM
TOPICAL_CREAM | Freq: Two times a day (BID) | CUTANEOUS | Status: DC
  Filled 2019-03-11: qty 1

## 2019-03-11 NOTE — Progress Notes (Signed)
Speech Language Pathology Weekly Progress and Session Note  Patient Details  Name: Debra Hopkins MRN: [id number] Date of Birth: April 16, 1940  Beginning of progress report period: March 04, 2019 End of progress report period: March 11, 2019  Today's Date: 03/11/2019 SLP Individual Time: 1300-1355 SLP Individual Time Calculation (min): 55 min  Short Term Goals: Week 1: SLP Short Term Goal 1 (Week 1): Patient will demonstrate functional problem solving for basic and familair tasks with Min A verbal cues. SLP Short Term Goal 1 - Progress (Week 1): Not met SLP Short Term Goal 2 (Week 1): Patient will recall new, daily information with use of external aids with Mod A verbal and visual cues. SLP Short Term Goal 2 - Progress (Week 1): Not met SLP Short Term Goal 3 (Week 1): Patient will self-monitor and correct errors during functional tasks with Min A verbal cues. SLP Short Term Goal 3 - Progress (Week 1): Not met SLP Short Term Goal 4 (Week 1): Patient will demonstrate sustained attention to tasks for ~10 minutes with Min A verbal cues for redirection. SLP Short Term Goal 4 - Progress (Week 1): Not met    New Short Term Goals: Week 2: SLP Short Term Goal 1 (Week 2): Patient will demonstrate sustained attention to tasks for ~10 minutes with Mod A verbal cues for redirection. SLP Short Term Goal 2 (Week 2): Patient will self-monitor and correct errors during functional tasks with Mod A verbal cues. SLP Short Term Goal 3 (Week 2): Patient will recall new, daily information with use of external aids with Mod A verbal and visual cues. SLP Short Term Goal 4 (Week 2): Patient will demonstrate functional problem solving for basic and familair tasks with Min A verbal cues.  Weekly Progress Updates: Patient has made minimal gains due to ongoing medical issues and has not met any STGs this reporting period. Currently, patient requires overall Mod-Max A verbal cues to complete functional and familiar  tasks safely in regards sustained attention, emergent awareness, recall with use of strategies, and problem solving. Patient and family education ongoing. Patient would benefit from continued skilled SLP intervention to maximize her cognitive functioning prior to discharge.     Intensity: Minumum of 1-2 x/day, 30 to 90 minutes Frequency: 3 to 5 out of 7 days Duration/Length of Stay: 03/29/19 Treatment/Interventions: Cognitive remediation/compensation;Internal/external aids;Therapeutic Activities;Environmental controls;Cueing hierarchy;Functional tasks;Patient/family education   Daily Session  Skilled Therapeutic Interventions: Skilled treatment session focused on cognitive goals. Upon arrival, patient was awake and alert. SLP facilitated session by providing Min A verbal cues for organization during a complex money management task and overall Mod-Max A verbal cues for sustained attention to task for ~2 minute intervals. Patient appeared more engaged and interactive and participated in a conversation about her hobbies such as word puzzles. Patient also participated in a conversation in regards to d/c planning and reports she plans on letting her son take over all responsibilities. Encouraged her that its good to be realistic but not to use that as an excuse to not attempt cognitive tasks, etc She verbalized understanding. Patient left supine in bed with alarm on and all needs within reach. Continue with current plan of care.     Pain Pain Assessment Pain Scale: 0-10 Pain Score: 8  Faces Pain Scale: Hurts little more Pain Type: Acute pain Pain Location: Back Pain Orientation: Lower;Mid Pain Radiating Towards: surgical stump Pain Descriptors / Indicators: Aching Pain Frequency: Intermittent Pain Onset: On-going Patients Stated Pain Goal: 3 Pain Intervention(s): Medication (  See eMAR) Multiple Pain Sites: No  Therapy/Group: Individual Therapy  Ethyl Vila 03/11/2019, 6:50  AM

## 2019-03-11 NOTE — Progress Notes (Signed)
Pt temp noted at 100.7 at 1110. Tylenol given at about 1000. MD Lovorn notified. No new orders at this time. Pharmacist also contacted since patient just completed 1 hour post vitals for amoxicillin trial . No rash or shortness of breath noted. No new orders received.

## 2019-03-11 NOTE — Progress Notes (Signed)
The BP of 79/42 was with therapy between 1400-1430. I reassessed BP at 1448- 92/41. Patient resting comfortably in bed.

## 2019-03-11 NOTE — Progress Notes (Signed)
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 She explained it happened many years ago and she cannot remember exactly what happened, she thinks she was around 86-79 years old. She does not remember requiring any medical attention or having to be admitted to the hospital for her reaction. She has not trialed any penicillin like antibiotics since this reaction, but she is tolerating cefazolin well her in the hospital.   Given her reaction occurred about 70 years ago and about 80% of patients with a Type 1 allergic reaction like Hives lose their allergy in 10 years I have a low suspicion she is still allergic. I spoke with her about the amoxicillin graded challenge and why we like to get rid of patients allergies if possible and she was agreeable to try the challenge. I also counseled her that this amoxicillin challenge will rule out her chances of having an immediate reaction but she still has a chance of a delayed reaction. However, these reactions are typically less severe.   Post-Challenge: Patient tolerated the challenge with no issues confirmed with the RN. I will remove her allergy from the chart and call her pharmacy and PCP to have them remove the allergy as well.    Nicoletta Dress, PharmD PGY2 Infectious Disease Pharmacy Resident

## 2019-03-11 NOTE — Plan of Care (Signed)
Pt's plan of care adjusted to 15/7 after speaking with care team and discussed with the PA as pt currently unable to tolerate current therapy schedule with OT, PT, and SLP.   Weston Anna, [place], [place]

## 2019-03-11 NOTE — Progress Notes (Signed)
[place] PHYSICAL MEDICINE & REHABILITATION PROGRESS NOTE   Subjective/Complaints:   Pt reports Pt reports feeling better today- still sleepy, but less lethargic overall. Per nurse, pt having more pain from pressure ulcer on backside than groin and AKA.  LBM was 12/24 per nurse but pt reported was 12/29- working on getting her to have BM.  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   Pt also reports pain is "ok" currently with meds.  ROS: Patient denies CP, shortness of breath, nausea, vomiting or diarrhea.  Objective:   No results found. Recent Labs    03/10/19 0453 03/11/19 0549  WBC 3.0* 4.5  HGB 6.9* 8.4*  HCT 21.3* 25.3*  PLT 135* 110*   Recent Labs    03/10/19 0453  NA 138  K 4.0  CL 102  CO2 26  GLUCOSE 199*  BUN 48*  CREATININE 1.54*  CALCIUM 9.0    Intake/Output Summary (Last 24 hours) at 03/11/2019 1005 Last data filed at 03/11/2019 0700 Gross per 24 hour  Intake 3085 ml  Output -  Net 3085 ml     Physical Exam: Vital Signs Blood pressure (!) 116/45, pulse 81, temperature 99 F (37.2 C), resp. rate 18, height 5\' 3"  (1.6 m), weight 73.3 kg, SpO2 96 %.  Constitutional: No distress . Vital signs and labs reviewed.  asleep but woke easily to stimuli; sitting up in bed; better color- is more pink, not pale; NAD HENT: Normocephalic.  Atraumatic. Eyes: EOMI. No discharge. Cardiovascular: RRR Resp: Nomal effort.  CTA B/L GI: Non-distended. NT, soft, (+) hypoactive BS Skin: pink. No signs of rash from ABX- Stage III on sacrum and stage II on B/L buttocks- in gluteal fold per staff Right AKA with dressing C/D/I; wound VAC R groin Psych: flat affect Musc: Right AKA with edema and tenderness Neuro: Lethargic Motor: Bilateral upper extremities: Appear to be 4+/5 proximal distal Left lower  extremity: Appears to be 3/5 hip flexion, knee extension and 4/5 ankle dorsiflexion Right lower extremity: Limited due to pain  Assessment/Plan: 1. 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  Physiatrist is providing close team supervision and 24 hour management of active medical problems listed below.  Physiatrist and rehab team continue to assess barriers to discharge/monitor patient progress toward functional and medical goals  Care Tool:  Bathing    Body parts bathed by patient: Right arm, Left arm, Chest, Face   Body parts bathed by helper: Abdomen, Front perineal area, Buttocks, Right upper leg, Left upper leg, Left lower leg Body parts n/a: Right lower leg   Bathing assist Assist Level: 2 Helpers     Upper Body Dressing/Undressing Upper body dressing   What is the patient wearing?: Hospital gown only    Upper body assist Assist Level: Moderate Assistance - Patient 50 - 74%    Lower Body Dressing/Undressing Lower body dressing      What is the patient wearing?: Hospital gown only     Lower body assist Assist for lower body dressing: Moderate Assistance - Patient 50 - 74%     Toileting Toileting    Toileting assist Assist for toileting: 2 Helpers     Transfers Chair/bed transfer  Transfers  assist  Chair/bed transfer activity did not occur: Safety/medical concerns(Pt with increased pain and LLE weakness limiting participation in therapy)  Chair/bed transfer assist level: Minimal Assistance - Patient > 75%     Locomotion Ambulation   Ambulation assist   Ambulation activity did not occur: Safety/medical concerns(Pt with increased pain and LLE weakness limiting participation in therapy)          Walk 10 feet activity   Assist  Walk 10 feet activity did not occur: Safety/medical concerns(Pt with  increased pain and LLE weakness limiting participation in therapy)        Walk 50 feet activity   Assist Walk 50 feet with 2 turns activity did not occur: Safety/medical concerns(Pt with increased pain and LLE weakness limiting participation in therapy)         Walk 150 feet activity   Assist Walk 150 feet activity did not occur: Safety/medical concerns(Pt with increased pain and LLE weakness limiting participation in therapy)         Walk 10 feet on uneven surface  activity   Assist Walk 10 feet on uneven surfaces activity did not occur: Safety/medical concerns(Pt with increased pain and LLE weakness limiting participation in therapy)         Wheelchair     Assist Will patient use wheelchair at discharge?: Yes Type of Wheelchair: Manual Wheelchair activity did not occur: Safety/medical concerns(Pt with increased pain and LLE weakness limiting participation in therapy)         Wheelchair 50 feet with 2 turns activity    Assist    Wheelchair 50 feet with 2 turns activity did not occur: Safety/medical concerns(Pt with increased pain and LLE weakness limiting participation in therapy)       Wheelchair 150 feet activity     Assist  Wheelchair 150 feet activity did not occur: Safety/medical concerns(Pt with increased pain and LLE weakness limiting participation in therapy)       Blood pressure (!) 116/45, pulse 81, temperature 99 F (37.2 C), resp. rate 18, height 5\' 3"  (1.6 m), weight 73.3 kg, SpO2 96 %.  1. 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 Vancomycin resumed for wound coverage 03/01/2019   Continue CIR 2. Antithrombotics:  -DVT/anticoagulation: Subcutaneous heparin- suggest Doppler of LLE due to severe pain of LLE   12/23- Dopplers of LLE (-) -antiplatelet therapy: Aspirin 81 mg daily  3. Pain Management: Lyrica 25 mg 3 times daily as needed, oxycodone  as needed   lidoderm patches for L calf and coccyx; added skelaxin for muscle pain prn; and made lyrica scheduled- probably need to increase in 2-3 days.  Tylenol scheduled 4. Mood: Ego support -antipsychotic agents: N/A  5. Neuropsych: This patient is capable of making decisions on her own behalf?.  6. Skin/Wound Care: Routine skin checks   Appreciate vascular Recs-right groin with fat necrosis and serous drainage Will likely require groin debridement and possible VAC placement this week.   12/29- OR today  12/30- has wound VAC- saw PA form vascular- she noted no changes currently  12/31- wound Care consult ordered as well as Purwick at night for incontinence and air mattress  Wound care orders-  Cleanse sacral wound with NS and pat dry. Apply alginate dressing to wound bed ([facility]WSON #[id number]) and cover with sacral silicone foam. Change alginate daily and replace foam every three days 7. Fluids/Electrolytes/Nutrition: Routine in and outs. CMP ordered.  8. Acute blood loss anemia.  CBC stable on 12/24, with decreased Hgb on 12/25, will repeat tomorrow.    12/26: Hgb with increase.  12/29- Hb down to 7.1 from 7.8- with OR today, will need transfusion.   12/30- 2 units pRBCs today  12/31- Hb up to 8.4 9. History of TIA. Continue aspirin  10. Hypertension. Clonidine 0.2 mg nightly,Cozaar 100 mg daily, Demadex 20 mg daily, Procardia XL 30 mg daily. Monitor with increased mobility.   hydralazine 25 mg nightly increased to twice daily on 12/25, changed back to nightly on 12/27  Controlled on 12/28 11. Hypothyroidism. Synthroid  12. Diabetes mellitus peripheral neuropathy. Hemoglobin A1c 5.9. Lantus insulin 15 units nightly. Check blood sugars before meals and at bedtime. Monitor with increased mobility.  CBG (last 3)  Recent Labs    03/10/19 1644 03/10/19 2130 03/11/19 0620  GLUCAP 254* 157* 177*   Stable/little elevated on 12/30   12/31- BGs better back on home Lantus dose 13. Hyperlipidemia.  Zocor  14. Constipation. Laxative assistance  15. GERD. Protonix  16. Thrombocytopenia-   Platelets 96 on 12/26, labs pending for today  12/29- Plts slightly better at 113k  12/31- Plts 110k after surgery 17. Leukopenia  WBCs 2.4 on 12/26, labs pending for today  12/29- WBC 1.9  12/30- up to 3k 18. Anemia-   Hemoglobin 7.8 on 12/26, labs pending for today-may require transfusion given multiple associated sequela  12/29- Hb down to 7.1- will reorder for tomorrow, -likely will need transfusion.   12/30- transfusion today 2 units  12/31- Hb 8.4 s/p transfusion 19. AKI-   Creatinine 1.15 on 12/26, labs pending for today   12/29- Cr 1.39- likely due to Vanc  12/30- Cr up to 1.54- on Ancef- could be due ot NPO status all yesterday. 20. Infection?- Vanc restarted due to increased drainage from groin:  Continue IV Vanco, critical value on 12/27  12/30 changed to Ancef yesterday- will monitor- is 2g q12 hours per pharmacy 21. Hypokalemia:   Potassium 3.9 on 12/26, continue to monitor  12/29- K+ 4.4 22.  Sleep disturbance  Trazodone 100 nightly-consider decreasing dose if persistently lethargic in a.m. 23.  Pancytopenia  ?  Improving, labs pending for today  12/29-a little worse- will recheck in AM- could be due to [place]   12/30- WBC up to 3.0 and plts up to 130k- could have been Vanc  12/31- WBC up to 4.5k- doing MUCH better LOS: 9 days A FACE TO FACE EVALUATION WAS PERFORMED  Abri Vacca 03/11/2019, 10:05 AM

## 2019-03-11 NOTE — Consult Note (Addendum)
WOC consult requested for Vac change on Monday to right groin.  Vascular team performed the first post-op dressing today and assessed wound appearance, according to progress notes.  Our team will perform the dressing changes as requested beginning next week. Supplies ordered to the bedside. Julien Girt MSN, RN, [place], [place], [place]

## 2019-03-11 NOTE — Consult Note (Addendum)
WOC Nurse Consult Note: Reason for Consult: Coccyx wound, stage 3 Moisture associated skin damage to bilateral buttocks in gluteal fold.  Incontinent of stool.  Has urinary external manager in place.  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  (VAscular is managing this per MD note) Wound type:Moisture and pressure Pressure Injury POA: No Measurement: Bilateral buttocks in gluteal fold:  4 cm x 1 cm x 0.2 cm  Coccyx:  5 cm x 3 cm x 0.3 cm  Wound bed: pale pink and moist Drainage (amount, consistency, odor) minimal serosanguinous  Periwound:intact Dressing procedure/placement/frequency: Cleanse sacral wound with NS and pat dry. Apply alginate dressing to wound bed ([facility]WSON #625) and cover with sacral silicone foam. Change alginate daily and replace foam every three days.  Cleanse buttocks with soap and water and pat dry. Apply Eucerin twice daily and PRN soilage.  No disposable briefs or underpads.   Will not follow at this time.  Please re-consult if needed.  Domenic Moras MSN, RN, FNP-BC CWON Wound, Ostomy, Continence Nurse Pager [phone]

## 2019-03-11 NOTE — Progress Notes (Signed)
Progress Note    03/11/2019 12:00 PM 2 Days Post-Op  Subjective:  S/p right groin derbridement/muscle flap with wound VAC placement. S/P right        AKA. States she is "burnng up"  VAC change at bedside with Dr. [name]  Vitals:   03/11/19 1110 03/11/19 1159  BP: (!) 106/37   Pulse: 92   Resp: 18   Temp: (!) 100.7 F (38.2 C) 99.3 F (37.4 C)  SpO2: 98%     Physical Exam: Lungs:  Non labored Incisions:  AKA incision well approximated; no drainage Extremities:  Right residual limb edematous.  VAC removed. Underlying muscle is beefy red. No purulence   CBC    Component Value Date/Time   WBC 4.5 03/11/2019 0549   RBC 2.72 (L) 03/11/2019 0549   HGB 8.4 (L) 03/11/2019 0549   HCT 25.3 (L) 03/11/2019 0549   PLT 110 (L) 03/11/2019 0549   MCV 93.0 03/11/2019 0549   MCH 30.9 03/11/2019 0549   MCHC 33.2 03/11/2019 0549   RDW 14.6 03/11/2019 0549   LYMPHSABS 0.8 03/10/2019 0453   MONOABS 0.7 03/10/2019 0453   EOSABS 0.0 03/10/2019 0453   BASOSABS 0.0 03/10/2019 0453    BMET    Component Value Date/Time   NA 138 03/10/2019 0453   NA 137 11/04/2018 1132   K 4.0 03/10/2019 0453   CL 102 03/10/2019 0453   CO2 26 03/10/2019 0453   GLUCOSE 199 (H) 03/10/2019 0453   BUN 48 (H) 03/10/2019 0453   BUN 24 11/04/2018 1132   CREATININE 1.54 (H) 03/10/2019 0453   CALCIUM 9.0 03/10/2019 0453   GFRNONAA 32 (L) 03/10/2019 0453   GFRAA 37 (L) 03/10/2019 0453     Intake/Output Summary (Last 24 hours) at 03/11/2019 1200 Last data filed at 03/11/2019 0700 Gross per 24 hour  Intake 2730 ml  Output --  Net 2730 ml    HOSPITAL MEDICATIONS Scheduled Meds: . sodium chloride   Intravenous Once  . sodium chloride   Intravenous Once  . acetaminophen  650 mg Oral TID  . vitamin C  500 mg Oral BID  . aspirin EC  81 mg Oral Daily  . Chlorhexidine Gluconate Cloth  6 each Topical Daily  . cloNIDine  0.2 mg Oral QHS  . docusate sodium  100 mg Oral Daily  . feeding supplement  (ENSURE ENLIVE)  237 mL Oral TID BM  . feeding supplement (PRO-STAT SUGAR FREE 64)  30 mL Oral BID  . furosemide  20 mg Intravenous Once  . furosemide  20 mg Intravenous Once  . Gerhardt's butt cream   Topical BID  . heparin  5,000 Units Subcutaneous Q8H  . hydrALAZINE  25 mg Oral QHS  . hydrocerin   Topical BID  . insulin aspart  0-9 Units Subcutaneous TID AC & HS  . insulin glargine  10 Units Subcutaneous QHS  . levothyroxine  50 mcg Oral QAC breakfast  . lidocaine  2 patch Transdermal Q24H  . losartan  100 mg Oral Daily  . multivitamin with minerals  1 tablet Oral Daily  . NIFEdipine  30 mg Oral Daily  . pantoprazole  40 mg Oral Daily  . pneumococcal 23 valent vaccine  0.5 mL Intramuscular Tomorrow-1000  . pregabalin  25 mg Oral TID  . simvastatin  40 mg Oral q1800  . torsemide  20 mg Oral Daily  . zinc sulfate  220 mg Oral Daily   Continuous Infusions: .  ceFAZolin (ANCEF) IV  2 g (03/10/19 2339)   PRN Meds:.bisacodyl, diphenhydrAMINE, EPINEPHrine, guaiFENesin-dextromethorphan, metaxalone, oxyCODONE, polyethylene glycol, sodium chloride flush, sorbitol  Assessment:  78 y.o. 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  Right groin fat necrosis requiring debridement and myofascial flap closure. Febrile.    Antibiotics continue. No evidence of wound infection. Right residual limb edematous.   2 Days Post-Op  Plan: -Consult wound care for VAC change on Monday -DVT prophylaxis:  heparin   Risa Grill, PA-C [facility] [phone] 03/11/2019  12:00 PM

## 2019-03-11 NOTE — Progress Notes (Signed)
Occupational Therapy Weekly Progress Note  Patient Details  Name: Debra Hopkins MRN: [id number] Date of Birth: June 09, 1940  Beginning of progress report period: March 03, 2019 End of progress report period: March 11, 2019   Patient has met 0 of 3 short term goals.  Pt has been limited this reporting period by ongoing medical complications, including requiring an additional surgery and a blood transfusion. Pt's therapy schedule has been adjusted to 15/7 to reflect need for reduced intensity to meet pt needs. Pt continues to be motivated to participate.   Patient continues to demonstrate the following deficits: muscle weakness, decreased cardiorespiratoy endurance, decreased problem solving, decreased safety awareness and delayed processing and decreased sitting balance, decreased standing balance, decreased postural control and decreased balance strategies and therefore will continue to benefit from skilled OT intervention to enhance overall performance with BADL.  Patient progressing toward long term goals..  Continue plan of care.  OT Short Term Goals Week 1:  OT Short Term Goal 1 (Week 1): Pt will don pants with mod A OT Short Term Goal 1 - Progress (Week 1): Not met OT Short Term Goal 2 (Week 1): Pt will complete transfer to [facility] with max +1 OT Short Term Goal 2 - Progress (Week 1): Not met OT Short Term Goal 3 (Week 1): Pt will propel w/c 50 ft with min A to increase home accessibility OT Short Term Goal 3 - Progress (Week 1): Not met Week 2:  OT Short Term Goal 1 (Week 2): Pt will maintain EOB sitting balance with mod A during simple ADL task OT Short Term Goal 2 (Week 2): Pt will complete transfer to [facility] with max +1 assist OT Short Term Goal 3 (Week 2): Pt will don shirt with min A   Therapy/Group: Individual Therapy  Curtis Sites 03/11/2019, 4:56 PM

## 2019-03-11 NOTE — Progress Notes (Signed)
Physical Therapy Session Note  Patient Details  Name: Debra Hopkins MRN: [id number] Date of Birth: Aug 27, 1940  Today's Date: 03/11/2019 PT Individual Time: 7544-9201 PT Individual Time Calculation (min): 30 min   Short Term Goals: Week 1:  PT Short Term Goal 1 (Week 1): pt will transfer supine<>sit with min A PT Short Term Goal 1 - Progress (Week 1): Progressing toward goal PT Short Term Goal 2 (Week 1): pt will transfer bed<>chair with LRAD mod A PT Short Term Goal 2 - Progress (Week 1): Met PT Short Term Goal 3 (Week 1): pt will perform car transfer with LRAD mod A PT Short Term Goal 3 - Progress (Week 1): Progressing toward goal Week 2:  PT Short Term Goal 1 (Week 2): Patient will perform bed<>chair transfers with min A. PT Short Term Goal 2 (Week 2): Patient will perform bed mobility with min A. PT Short Term Goal 3 (Week 2): Patient will perform sit<>stand with max A using LRAD.  Skilled Therapeutic Interventions/Progress Updates:   Pt received supine in bed and agreeable to PT. Supine>sit transfer with CGA assist and significantly increased time. PT instructed pt in sitting balance forward/lateral reaching to target with supervision assist x 8 BUE.  Following forward reaching task pt reports increased dizziness. PT assessed BP 79/42. Sit>supine with min assist for LLE management. Pain reported in the groin with transfer. BP re-assessed in supine 102/45. Pt reports mild improvement in orthostatic s/s. Pt noted to have increased palor and lethargy once supine. Pt then requested to rest in bed due to extreme fatigue. Pt  left supine in bed with call bell in reach and all needs met.           Therapy Documentation Precautions:  Precautions Precautions: Fall Precaution Comments: R AKA NWB Restrictions Weight Bearing Restrictions: Yes RLE Weight Bearing: Non weight bearing General: PT Amount of Missed Time (min): 30 Minutes PT Missed Treatment Reason: Patient fatigue Vital  Signs: Therapy Vitals Temp: 98.8 F (37.1 C) Temp Source: Oral Pulse Rate: 79 Resp: 16 BP: (!) 92/41 Patient Position (if appropriate): Lying Oxygen Therapy SpO2: 98 % O2 Device: Room Air Pain: Pain Assessment Pain Scale: 0-10 Pain Score: 5  Pain Type: Acute pain Pain Location: Groin Pain Orientation: Right Pain Descriptors / Indicators: Aching Pain Onset: Sudden Pain Intervention(s): Medication (See eMAR)    Therapy/Group: Individual Therapy  Lorie Phenix 03/11/2019, 3:08 PM

## 2019-03-11 NOTE — Plan of Care (Signed)
  Problem: Consults Goal: RH LIMB LOSS PATIENT EDUCATION Description: Description: See Patient Education module for eduction specifics. Outcome: Progressing Goal: Skin Care Protocol Initiated - if Braden Score 18 or less Description: If consults are not indicated, leave blank or document N/A Outcome: Progressing Goal: Nutrition Consult-if indicated Outcome: Progressing Goal: Diabetes Guidelines if Diabetic/Glucose > 140 Description: If diabetic or lab glucose is > 140 mg/dl - Initiate Diabetes/Hyperglycemia Guidelines & Document Interventions  Outcome: Progressing   Problem: RH BLADDER ELIMINATION Goal: RH STG MANAGE BLADDER WITH ASSISTANCE Description: STG Manage Bladder With moderate Assistance Outcome: Progressing   Problem: RH SKIN INTEGRITY Goal: RH STG ABLE TO PERFORM INCISION/WOUND CARE W/ASSISTANCE Description: STG Able To Perform Incision/Wound Care With moderate Assistance. Outcome: Progressing   Problem: RH SAFETY Goal: RH STG ADHERE TO SAFETY PRECAUTIONS W/ASSISTANCE/DEVICE Description: STG Adhere to Safety Precautions With moderate Assistance/Device. Outcome: Progressing Goal: RH STG DECREASED RISK OF FALL WITH ASSISTANCE Description: STG Decreased Risk of Fall With moderate Assistance. Outcome: Progressing   Problem: RH PAIN MANAGEMENT Goal: RH STG PAIN MANAGED AT OR BELOW PT'S PAIN GOAL Description: Patient's pain level will be <3 by discharge. Outcome: Progressing   Problem: RH KNOWLEDGE DEFICIT LIMB LOSS Goal: RH STG INCREASE KNOWLEDGE OF SELF CARE AFTER LIMB LOSS Description: Patient will verbalize how to promote good circulation in stump and leg. Outcome: Progressing   Problem: RH BOWEL ELIMINATION Goal: RH STG MANAGE BOWEL WITH ASSISTANCE Description: STG Manage Bowel with moderate Assistance. Outcome: Not Progressing

## 2019-03-11 NOTE — Progress Notes (Signed)
Occupational Therapy Session Note  Patient Details  Name: Debra Hopkins MRN: [id number] Date of Birth: [birth year]  Today's Date: 03/11/2019 OT Individual Time: 3968-8648 OT Individual Time Calculation (min): 30 min    Short Term Goals: Week 1:  OT Short Term Goal 1 (Week 1): Pt will don pants with mod A OT Short Term Goal 2 (Week 1): Pt will complete transfer to [facility] with max +1 OT Short Term Goal 3 (Week 1): Pt will propel w/c 50 ft with min A to increase home accessibility  Skilled Therapeutic Interventions/Progress Updates:    Pt received supine, lethargic but answering questions and smiling at therapist. Agreeable to session. Pt c/o pain in residual limb with bed mobility, alleviated by rest. Pt completed bed mobility to EOB with max A. Pt with strong posterior lean EOB and unable to shift weight forward with max multimodal cueing. Pt was transferred back to supine in bed to continue ADLs. Pt completed UB bathing with (S), requiring occasional cueing for maintaining arousal and attention to task. Pt required max A for LB ADLs from bed level. Increased pain with rolling. Pt requiring max A to roll R and L. Pt was left supine with all needs met, bed alarm set.   Therapy Documentation Precautions:  Precautions Precautions: Fall Precaution Comments: R AKA NWB Restrictions Weight Bearing Restrictions: Yes RLE Weight Bearing: Non weight bearing   Therapy/Group: Individual Therapy  Curtis Sites 03/11/2019, 6:52 AM

## 2019-03-12 ENCOUNTER — Inpatient Hospital Stay (HOSPITAL_COMMUNITY): Payer: Medicare Other

## 2019-03-12 ENCOUNTER — Inpatient Hospital Stay (HOSPITAL_COMMUNITY): Payer: Medicare Other | Admitting: Speech Pathology

## 2019-03-12 LAB — GLUCOSE, CAPILLARY
Glucose-Capillary: 158 mg/dL — ABNORMAL HIGH (ref 70–99)
Glucose-Capillary: 298 mg/dL — ABNORMAL HIGH (ref 70–99)
Glucose-Capillary: 175 mg/dL — ABNORMAL HIGH (ref 70–99)
Glucose-Capillary: 208 mg/dL — ABNORMAL HIGH (ref 70–99)

## 2019-03-12 LAB — CBC WITH DIFFERENTIAL/PLATELET
Abs Immature Granulocytes: 0.16 10*3/uL — ABNORMAL HIGH (ref 0.00–0.07)
Basophils Absolute: 0.1 10*3/uL (ref 0.0–0.1)
Basophils Relative: 2 %
Eosinophils Absolute: 0 10*3/uL (ref 0.0–0.5)
Eosinophils Relative: 1 %
HCT: 23.1 % — ABNORMAL LOW (ref 36.0–46.0)
Hemoglobin: 7.6 g/dL — ABNORMAL LOW (ref 12.0–15.0)
Immature Granulocytes: 5 %
Lymphocytes Relative: 20 %
Lymphs Abs: 0.7 10*3/uL (ref 0.7–4.0)
MCH: 30.5 pg (ref 26.0–34.0)
MCHC: 32.9 g/dL (ref 30.0–36.0)
MCV: 92.8 fL (ref 80.0–100.0)
Monocytes Absolute: 0.5 10*3/uL (ref 0.1–1.0)
Monocytes Relative: 15 %
Neutro Abs: 1.9 10*3/uL (ref 1.7–7.7)
Neutrophils Relative %: 57 %
Platelets: 95 10*3/uL — ABNORMAL LOW (ref 150–400)
RBC: 2.49 MIL/uL — ABNORMAL LOW (ref 3.87–5.11)
RDW: 14.5 % (ref 11.5–15.5)
WBC: 3.3 10*3/uL — ABNORMAL LOW (ref 4.0–10.5)
nRBC: 0 % (ref 0.0–0.2)

## 2019-03-12 LAB — COMPREHENSIVE METABOLIC PANEL
ALT: 12 U/L (ref 0–44)
AST: 21 U/L (ref 15–41)
Albumin: 1.9 g/dL — ABNORMAL LOW (ref 3.5–5.0)
Alkaline Phosphatase: 62 U/L (ref 38–126)
Anion gap: 10 (ref 5–15)
BUN: 75 mg/dL — ABNORMAL HIGH (ref 8–23)
CO2: 26 mmol/L (ref 22–32)
Calcium: 8.7 mg/dL — ABNORMAL LOW (ref 8.9–10.3)
Chloride: 101 mmol/L (ref 98–111)
Creatinine, Ser: 1.75 mg/dL — ABNORMAL HIGH (ref 0.44–1.00)
GFR calc Af Amer: 32 mL/min — ABNORMAL LOW (ref >60)
GFR calc non Af Amer: 27 mL/min — ABNORMAL LOW (ref >60)
Glucose, Bld: 198 mg/dL — ABNORMAL HIGH (ref 70–99)
Potassium: 4 mmol/L (ref 3.5–5.1)
Sodium: 137 mmol/L (ref 135–145)
Total Bilirubin: 0.4 mg/dL (ref 0.3–1.2)
Total Protein: 4.8 g/dL — ABNORMAL LOW (ref 6.5–8.1)

## 2019-03-12 MED ORDER — CHLORHEXIDINE GLUCONATE CLOTH 2 % EX PADS
6.0000 | MEDICATED_PAD | Freq: Two times a day (BID) | CUTANEOUS | Status: DC
  Administered 2019-03-12 – 2019-03-15 (×7): 6 via TOPICAL

## 2019-03-12 MED ORDER — SODIUM CHLORIDE 0.9 % NICU IV INFUSION SIMPLE
INJECTION | INTRAVENOUS | Status: AC
  Administered 2019-03-12 – 2019-03-13 (×2): 75 mL/h via INTRAVENOUS
  Filled 2019-03-12 (×4): qty 500

## 2019-03-12 NOTE — Progress Notes (Signed)
[place] PHYSICAL MEDICINE & REHABILITATION PROGRESS NOTE   Subjective/Complaints:   Pt reports Pt reports feeling better today- still sleepy, but less lethargic overall. Per nurse, pt having more pain from pressure ulcer on backside than groin and AKA.  LBM was 12/24 per nurse but pt reported was 12/29- working on getting her to have BM.  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   Pt also reports pain is "ok" currently with meds.  ROS: Patient denies CP, shortness of breath, nausea, vomiting or diarrhea.  Objective:   No results found. Recent Labs    03/11/19 0549 03/12/19 0357  WBC 4.5 3.3*  HGB 8.4* 7.6*  HCT 25.3* 23.1*  PLT 110* 95*   Recent Labs    03/10/19 0453 03/12/19 0357  NA 138 137  K 4.0 4.0  CL 102 101  CO2 26 26  GLUCOSE 199* 198*  BUN 48* 75*  CREATININE 1.54* 1.75*  CALCIUM 9.0 8.7*    Intake/Output Summary (Last 24 hours) at 03/12/2019 0923 Last data filed at 03/12/2019 0700 Gross per 24 hour  Intake 1077 ml  Output 450 ml  Net 627 ml     Physical Exam: Vital Signs Blood pressure (!) 97/40, pulse 68, temperature 98.3 F (36.8 C), resp. rate 17, height 5\' 3"  (1.6 m), weight 73.3 kg, SpO2 99 %.  Constitutional: No distress . Vital signs and labs reviewed.  asleep but woke easily to stimuli; sitting up in bed; better color- is more pink, not pale; NAD HENT: Normocephalic.  Atraumatic. Eyes: EOMI. No discharge. Cardiovascular: RRR Resp: Nomal effort.  CTA B/L GI: Non-distended. NT, soft, (+) hypoactive BS Skin: pink. No signs of rash from ABX- Stage III on sacrum and stage II on B/L buttocks- in gluteal fold per staff Right AKA with dressing C/D/I; wound VAC R groin Psych: flat affect Musc: Right AKA with edema and tenderness Neuro: Lethargic Motor: Bilateral upper extremities:  Appear to be 4+/5 proximal distal Left lower extremity: Appears to be 3/5 hip flexion, knee extension and 4/5 ankle dorsiflexion Right lower extremity: Limited due to pain  Assessment/Plan: 1. 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  Physiatrist is providing close team supervision and 24 hour management of active medical problems listed below.  Physiatrist and rehab team continue to assess barriers to discharge/monitor patient progress toward functional and medical goals  Care Tool:  Bathing    Body parts bathed by patient: Right arm, Left arm, Chest, Face   Body parts bathed by helper: Abdomen, Front perineal area, Buttocks, Right upper leg, Left upper leg, Left lower leg Body parts n/a: Right lower leg   Bathing assist Assist Level: 2 Helpers     Upper Body Dressing/Undressing Upper body dressing   What is the patient wearing?: Hospital gown only    Upper body assist Assist Level: Moderate Assistance - Patient 50 - 74%    Lower Body Dressing/Undressing Lower body dressing      What is the patient wearing?: Hospital gown only     Lower body assist Assist for lower body dressing: Moderate Assistance - Patient 50 - 74%     Toileting Toileting    Toileting assist Assist for toileting:  2 Helpers     Transfers Chair/bed transfer  Transfers assist  Chair/bed transfer activity did not occur: Safety/medical concerns(Pt with increased pain and LLE weakness limiting participation in therapy)  Chair/bed transfer assist level: Minimal Assistance - Patient > 75%     Locomotion Ambulation   Ambulation assist   Ambulation activity did not occur: Safety/medical concerns(Pt with increased pain and LLE weakness limiting participation in therapy)          Walk 10 feet activity   Assist  Walk 10 feet activity did not  occur: Safety/medical concerns(Pt with increased pain and LLE weakness limiting participation in therapy)        Walk 50 feet activity   Assist Walk 50 feet with 2 turns activity did not occur: Safety/medical concerns(Pt with increased pain and LLE weakness limiting participation in therapy)         Walk 150 feet activity   Assist Walk 150 feet activity did not occur: Safety/medical concerns(Pt with increased pain and LLE weakness limiting participation in therapy)         Walk 10 feet on uneven surface  activity   Assist Walk 10 feet on uneven surfaces activity did not occur: Safety/medical concerns(Pt with increased pain and LLE weakness limiting participation in therapy)         Wheelchair     Assist Will patient use wheelchair at discharge?: Yes Type of Wheelchair: Manual Wheelchair activity did not occur: Safety/medical concerns(Pt with increased pain and LLE weakness limiting participation in therapy)         Wheelchair 50 feet with 2 turns activity    Assist    Wheelchair 50 feet with 2 turns activity did not occur: Safety/medical concerns(Pt with increased pain and LLE weakness limiting participation in therapy)       Wheelchair 150 feet activity     Assist  Wheelchair 150 feet activity did not occur: Safety/medical concerns(Pt with increased pain and LLE weakness limiting participation in therapy)       Blood pressure (!) 97/40, pulse 68, temperature 98.3 F (36.8 C), resp. rate 17, height 5\' 3"  (1.6 m), weight 73.3 kg, SpO2 99 %.  1. 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 Vancomycin resumed for wound coverage 03/01/2019   Continue CIR 2. Antithrombotics:  -DVT/anticoagulation: Subcutaneous heparin- suggest Doppler of LLE due to severe pain of LLE   12/23- Dopplers of LLE (-) -antiplatelet therapy: Aspirin 81 mg daily  3. Pain Management: Lyrica 25  mg 3 times daily as needed, oxycodone as needed   lidoderm patches for L calf and coccyx; added skelaxin for muscle pain prn; and made lyrica scheduled- probably need to increase in 2-3 days.  Tylenol scheduled 4. Mood: Ego support -antipsychotic agents: N/A  5. Neuropsych: This patient is capable of making decisions on her own behalf?.  6. Skin/Wound Care: Routine skin checks   Appreciate vascular Recs-right groin with fat necrosis and serous drainage Will likely require groin debridement and possible VAC placement this week.   12/29- OR today  12/30- has wound VAC- saw PA form vascular- she noted no changes currently  12/31- wound Care consult ordered as well as Purwick at night for incontinence and air mattress  Wound care orders-  Cleanse sacral wound with NS and pat dry. Apply alginate dressing to wound bed ([facility] #625) and cover with sacral silicone foam. Change alginate daily and replace foam every three days 7. Fluids/Electrolytes/Nutrition: Routine  in and outs. CMP ordered.  8. Acute blood loss anemia. CBC stable on 12/24, with decreased Hgb on 12/25, will repeat tomorrow.    12/26: Hgb with increase.  12/29- Hb down to 7.1 from 7.8- with OR today, will need transfusion.   12/30- 2 units pRBCs today  12/31- Hb up to 8.4 9. History of TIA. Continue aspirin  10. Hypertension. Clonidine 0.2 mg nightly,Cozaar 100 mg daily, Demadex 20 mg daily, Procardia XL 30 mg daily. Monitor with increased mobility.   hydralazine 25 mg nightly increased to twice daily on 12/25, changed back to nightly on 12/27  Controlled on 12/28  11- BP running really low- likely due to dehydration- will write hold/reduced dose parameters on BP meds.  11. Hypothyroidism. Synthroid  12. Diabetes mellitus peripheral neuropathy. Hemoglobin A1c 5.9. Lantus insulin 15 units nightly. Check blood sugars before meals and at bedtime. Monitor with increased mobility.  CBG (last 3)  Recent Labs    03/11/19 1638  03/11/19 2107 03/12/19 0607  GLUCAP 196* 176* 158*   Stable/little elevated on 12/30   1/1- BGs better back on home Lantus dose 13. Hyperlipidemia. Zocor  14. Constipation. Laxative assistance  15. GERD. Protonix  16. Thrombocytopenia-   Platelets 96 on 12/26, labs pending for today  12/29- Plts slightly better at 113k  12/31- Plts 110k after surgery 17. Leukopenia  WBCs 2.4 on 12/26, labs pending for today  12/29- WBC 1.9  12/30- up to 3k 18. Anemia-   Hemoglobin 7.8 on 12/26, labs pending for today-may require transfusion given multiple associated sequela  12/29- Hb down to 7.1- will reorder for tomorrow, -likely will need transfusion.   12/30- transfusion today 2 units  12/31- Hb 8.4 s/p transfusion  1/1- Hb  7.6 19. AKI-   Creatinine 1.15 on 12/26, labs pending for today   12/29- Cr 1.39- likely due to Vanc  12/30- Cr up to 1.54- on Ancef- could be due to NPO status all yesterday.  1/1- Cr up to 1.75 and BUN up to 75- will hold Torsemide 20 mg as well as give some IVFs and called Pharmacy to see if they think if could be Cefazolin/Ancef  as cause. Pharmacy doesn't think Ancef is the cause.  20. Infection?- Vanc restarted due to increased drainage from groin:  Continue IV Vanco, critical value on 12/27  12/30 changed to Ancef yesterday- will monitor- is 2g q12  hours per pharmacy  1/1- con't same Ancef dose 21. Hypokalemia:   Potassium 3.9 on 12/26, continue to monitor  12/29- K+ 4.4 22.  Sleep disturbance  Trazodone 100 nightly-consider decreasing dose if persistently lethargic in a.m. 23.  Pancytopenia  ?  Improving, labs pending for today  12/29-a little worse- will recheck in AM- could be due to Mize   12/30- WBC up to 3.0 and plts up to 130k- could have been Vanc  12/31- WBC up to 4.5k- doing MUCH better  1/1- WBC down to 3.3- LOS: 10 days A FACE TO FACE EVALUATION WAS PERFORMED  Ladonte Verstraete 03/12/2019, 9:23 AM

## 2019-03-12 NOTE — Progress Notes (Signed)
   VASCULAR SURGERY ASSESSMENT & PLAN:   POD 3 -RIGHT GROIN DEBRIDEMENT, SARTORIUS FLAP, PLACEMENT OF VAC:  Her VAC has a good seal.  The plan is to consult the wound care team for [facility] change on Monday.  SUBJECTIVE:   No specific complaints.  PHYSICAL EXAM:   Vitals:   03/11/19 1535 03/11/19 1557 03/11/19 2011 03/11/19 2140  BP: (!) 79/42 91/68 (!) 96/50 (!) 97/40  Pulse: 84 81 66 68  Resp:  16 17   Temp:  98.4 F (36.9 C) 98.3 F (36.8 C)   TempSrc:  Oral    SpO2:  98% 95% 99%  Weight:      Height:       Her VAC has a good seal.  LABS:   Lab Results  Component Value Date   WBC 3.3 (L) 03/12/2019   HGB 7.6 (L) 03/12/2019   HCT 23.1 (L) 03/12/2019   MCV 92.8 03/12/2019   PLT 95 (L) 03/12/2019   Lab Results  Component Value Date   CREATININE 1.75 (H) 03/12/2019   Lab Results  Component Value Date   INR 1.01 12/18/2016   CBG (last 3)  Recent Labs    03/11/19 1638 03/11/19 2107 03/12/19 0607  GLUCAP 196* 176* 158*    PROBLEM LIST:    Principal Problem:   S/P AKA (above knee amputation) unilateral, right (HCC) Active Problems:   Essential hypertension, benign   PVD   Anemia   Type 2 diabetes mellitus with stage 3 chronic kidney disease, without long-term current use of insulin (HCC)   Right below-knee amputee (HCC)   Pressure injury of skin   AKI (acute kidney injury) ([place])   Leukopenia   Acute blood loss anemia   Sleep disturbance   Diabetic peripheral neuropathy (HCC)   Labile blood glucose   CURRENT MEDS:   . sodium chloride   Intravenous Once  . acetaminophen  650 mg Oral TID  . vitamin C  500 mg Oral BID  . aspirin EC  81 mg Oral Daily  . Chlorhexidine Gluconate Cloth  6 each Topical BID  . cloNIDine  0.2 mg Oral QHS  . docusate sodium  100 mg Oral Daily  . feeding supplement (ENSURE ENLIVE)  237 mL Oral TID BM  . feeding supplement (PRO-STAT SUGAR FREE 64)  30 mL Oral BID  . furosemide  20 mg Intravenous Once  . furosemide  20 mg  Intravenous Once  . Gerhardt's butt cream   Topical BID  . heparin  5,000 Units Subcutaneous Q8H  . hydrALAZINE  25 mg Oral QHS  . insulin aspart  0-9 Units Subcutaneous TID AC & HS  . insulin glargine  10 Units Subcutaneous QHS  . levothyroxine  50 mcg Oral QAC breakfast  . lidocaine  2 patch Transdermal Q24H  . losartan  100 mg Oral Daily  . multivitamin with minerals  1 tablet Oral Daily  . NIFEdipine  30 mg Oral Daily  . pantoprazole  40 mg Oral Daily  . pneumococcal 23 valent vaccine  0.5 mL Intramuscular Tomorrow-1000  . pregabalin  25 mg Oral TID  . simvastatin  40 mg Oral q1800  . torsemide  20 mg Oral Daily  . zinc sulfate  220 mg Oral Daily    Deitra Mayo Office: [phone] 03/12/2019

## 2019-03-12 NOTE — Progress Notes (Signed)
Social Work Patient ID: Debra Hopkins, female   DOB: 12-22-1940, 79 y.o.   MRN: [id number]   (late entry)  Spoke with pt's son, Debra Hopkins, yesterday to review team conference and discuss d/c plans.  Son is aware that targeted d/c date of 1/18 and min assist w/c level goals overall.  He is concerned with her current medical status and decline in alertness since vascular surgery earlier in week.   Son notes that he does not yet have a plan of how to provide 24/7 coverage at d/c "but I'm talking to everybody I know."  Did need to provide education about what [facility] Medicare will/ won't cover for care in the home.  He is aware of private caregiver avg cost/ hour.  He continues to want to plan for home d/c but is aware that SNF may be needed. Will continue to follow.  [name], LCSW

## 2019-03-12 NOTE — Progress Notes (Signed)
Physical Therapy Session Note  Patient Details  Name: Debra Hopkins MRN: [id number] Date of Birth: [birth year]  Today's Date: 03/12/2019 PT Individual Time: 1300-1330 PT Individual Time Calculation (min): 30 min   Short Term Goals: Week 1:  PT Short Term Goal 1 (Week 1): pt will transfer supine<>sit with min A PT Short Term Goal 1 - Progress (Week 1): Progressing toward goal PT Short Term Goal 2 (Week 1): pt will transfer bed<>chair with LRAD mod A PT Short Term Goal 2 - Progress (Week 1): Met PT Short Term Goal 3 (Week 1): pt will perform car transfer with LRAD mod A PT Short Term Goal 3 - Progress (Week 1): Progressing toward goal Week 2:  PT Short Term Goal 1 (Week 2): Patient will perform bed<>chair transfers with min A. PT Short Term Goal 2 (Week 2): Patient will perform bed mobility with min A. PT Short Term Goal 3 (Week 2): Patient will perform sit<>stand with max A using LRAD.  Skilled Therapeutic Interventions/Progress Updates:   Received pt supine in bed, pt agreeable to therapy, RN present in room administering pain medication and insulin. When asked by RN to state pain level pt unable to respond to question. RN reported pt has been drowsy and in pain all day. Pt with low BP per RN and on IV fluids. Session focused on functional mobility, LE strength, and improved tolerance to activity. Therapist assisted RN with repositioning pt in bed. Pt attempted ankle circles on LLE however was unable to follow instructions despite max verbal cueing. Pt able to perform 2 ankle pumps, otherwise unsuccessful with following commands. Pt attempted LLE active heel slides but again pt unable to sustain attention and required active assist to perform 5 heels slides. Pt with increased facial grimacing when therapist moved LLE but when asked if she was in any pain, pt denied pain. When asked pt if she was fatigued responded yes, and when asked if pt wanted to continue exercses pt stated yes. However by the  time the exercise was started pt unable to sustain attention, follow commands, or initiate movement. Therapist attempted exercises on RLE. Pt able to perform RLE SLR x 5 with significantly decreased ROM. Pt intermittently closing eyes and unable to continue with therapy. Therapist repositioned pt in bed and provided pillows for comfort and pressure relief. Concluded session with pt supine in bed, needs within reach, and bed alarm on. 30 minutes missed of skilled physical therapy.   Therapy Documentation Precautions:  Precautions Precautions: Fall Precaution Comments: R AKA NWB Restrictions Weight Bearing Restrictions: Yes RLE Weight Bearing: Non weight bearing   Therapy/Group: Individual Therapy Alfonse Alpers PT, DPT   03/12/2019, 7:45 AM

## 2019-03-12 NOTE — Progress Notes (Signed)
Speech Language Pathology Daily Session Note  Patient Details  Name: Debra Hopkins MRN: [id number] Date of Birth: 31-Dec-1940  Today's Date: 03/12/2019 SLP Individual Time: 0930-1025 SLP Individual Time Calculation (min): 55 min  Short Term Goals: Week 2: SLP Short Term Goal 1 (Week 2): Patient will demonstrate sustained attention to tasks for ~10 minutes with Mod A verbal cues for redirection. SLP Short Term Goal 2 (Week 2): Patient will self-monitor and correct errors during functional tasks with Mod A verbal cues. SLP Short Term Goal 3 (Week 2): Patient will recall new, daily information with use of external aids with Mod A verbal and visual cues. SLP Short Term Goal 4 (Week 2): Patient will demonstrate functional problem solving for basic and familair tasks with Min A verbal cues.  Skilled Therapeutic Interventions: Skilled treatment session focused on cognitive goals. Upon arrival, patient was incontinent of urine and bowel and required extra time and Mod A verbal and tactile cues to follow commands during bed mobility. Patient crying during peri care due to pain in groin. SLP attempted to distract the patient with conversation, however, patient unable to participate due to poor attention. At beginning of the session, patient was disoriented to time but oriented to place. Patient was able to utilize a calendar to recall the date and was able to carryover the information after a 15 minute delay. With encouragement, patient consumed 100% of her breakfast meal with Mod verbal cues needed for sustained attention to task. Patient left upright in bed with alarm on and all needs within reach. Continue with current plan of care.      Pain Pain during bed mobility, RN present and patient repositioned   Therapy/Group: Individual Therapy  Quanta Robertshaw 03/12/2019, 12:25 PM

## 2019-03-12 NOTE — Plan of Care (Signed)
  Problem: Consults Goal: RH LIMB LOSS PATIENT EDUCATION Description: Description: See Patient Education module for eduction specifics. Outcome: Progressing Goal: Skin Care Protocol Initiated - if Braden Score 18 or less Description: If consults are not indicated, leave blank or document N/A Outcome: Progressing Goal: Nutrition Consult-if indicated Outcome: Progressing Goal: Diabetes Guidelines if Diabetic/Glucose > 140 Description: If diabetic or lab glucose is > 140 mg/dl - Initiate Diabetes/Hyperglycemia Guidelines & Document Interventions  Outcome: Progressing   Problem: RH SKIN INTEGRITY Goal: RH STG ABLE TO PERFORM INCISION/WOUND CARE W/ASSISTANCE Description: STG Able To Perform Incision/Wound Care With total Assistance. Outcome: Progressing   Problem: RH SAFETY Goal: RH STG ADHERE TO SAFETY PRECAUTIONS W/ASSISTANCE/DEVICE Description: STG Adhere to Safety Precautions With moderate Assistance/Device. Outcome: Progressing Goal: RH STG DECREASED RISK OF FALL WITH ASSISTANCE Description: STG Decreased Risk of Fall With moderate Assistance. Outcome: Progressing   Problem: RH PAIN MANAGEMENT Goal: RH STG PAIN MANAGED AT OR BELOW PT'S PAIN GOAL Description: Patient's pain level will be <8 by discharge. Outcome: Progressing   Problem: RH KNOWLEDGE DEFICIT LIMB LOSS Goal: RH STG INCREASE KNOWLEDGE OF SELF CARE AFTER LIMB LOSS Description: Patient will verbalize how to promote good circulation in stump and leg. Outcome: Progressing   Problem: RH BOWEL ELIMINATION Goal: RH STG MANAGE BOWEL WITH ASSISTANCE Description: STG Manage Bowel with moderate Assistance. Outcome: Not Progressing   Problem: RH BLADDER ELIMINATION Goal: RH STG MANAGE BLADDER WITH ASSISTANCE Description: STG Manage Bladder With moderate Assistance Outcome: Not Progressing

## 2019-03-12 NOTE — Progress Notes (Signed)
Patient a little more drowsy this afternoon-difficult to keep attention. Poor appetite. Patient drinking 100% of ensure supplements. Patient had medium incontinent bowel movement today post suppository and sorbitol on previous shifts. Patient on purewick over night. Patient incontinent this morning, scanned for 471cc- cathed for 900cc of cloudy urine. New foam applied to sacrum and alginate dressing changed (stage III and stage II on bil buttocks) Patient grimacing with repositioning- reports of pain location vary from LLE, Rt groin and buttocks. 10 oxy given once at 1043 but trying to limit due to cognition/arousal.

## 2019-03-13 ENCOUNTER — Inpatient Hospital Stay (HOSPITAL_COMMUNITY): Payer: Medicare Other | Admitting: Occupational Therapy

## 2019-03-13 ENCOUNTER — Inpatient Hospital Stay (HOSPITAL_COMMUNITY): Payer: Medicare Other | Admitting: Physical Therapy

## 2019-03-13 LAB — CBC WITH DIFFERENTIAL/PLATELET
Abs Immature Granulocytes: 0.12 10*3/uL — ABNORMAL HIGH (ref 0.00–0.07)
Basophils Absolute: 0 10*3/uL (ref 0.0–0.1)
Basophils Relative: 1 %
Eosinophils Absolute: 0 10*3/uL (ref 0.0–0.5)
Eosinophils Relative: 0 %
HCT: 23.3 % — ABNORMAL LOW (ref 36.0–46.0)
Hemoglobin: 7.4 g/dL — ABNORMAL LOW (ref 12.0–15.0)
Immature Granulocytes: 5 %
Lymphocytes Relative: 24 %
Lymphs Abs: 0.6 10*3/uL — ABNORMAL LOW (ref 0.7–4.0)
MCH: 30.2 pg (ref 26.0–34.0)
MCHC: 31.8 g/dL (ref 30.0–36.0)
MCV: 95.1 fL (ref 80.0–100.0)
Monocytes Absolute: 0.4 10*3/uL (ref 0.1–1.0)
Monocytes Relative: 15 %
Neutro Abs: 1.3 10*3/uL — ABNORMAL LOW (ref 1.7–7.7)
Neutrophils Relative %: 55 %
Platelets: 88 10*3/uL — ABNORMAL LOW (ref 150–400)
RBC: 2.45 MIL/uL — ABNORMAL LOW (ref 3.87–5.11)
RDW: 14.3 % (ref 11.5–15.5)
WBC: 2.4 10*3/uL — ABNORMAL LOW (ref 4.0–10.5)
nRBC: 0 % (ref 0.0–0.2)

## 2019-03-13 LAB — GLUCOSE, CAPILLARY
Glucose-Capillary: 176 mg/dL — ABNORMAL HIGH (ref 70–99)
Glucose-Capillary: 230 mg/dL — ABNORMAL HIGH (ref 70–99)
Glucose-Capillary: 183 mg/dL — ABNORMAL HIGH (ref 70–99)
Glucose-Capillary: 164 mg/dL — ABNORMAL HIGH (ref 70–99)

## 2019-03-13 LAB — BASIC METABOLIC PANEL
Anion gap: 10 (ref 5–15)
BUN: 69 mg/dL — ABNORMAL HIGH (ref 8–23)
CO2: 26 mmol/L (ref 22–32)
Calcium: 8.8 mg/dL — ABNORMAL LOW (ref 8.9–10.3)
Chloride: 107 mmol/L (ref 98–111)
Creatinine, Ser: 1.32 mg/dL — ABNORMAL HIGH (ref 0.44–1.00)
GFR calc Af Amer: 44 mL/min — ABNORMAL LOW (ref >60)
GFR calc non Af Amer: 38 mL/min — ABNORMAL LOW (ref >60)
Glucose, Bld: 182 mg/dL — ABNORMAL HIGH (ref 70–99)
Potassium: 4.6 mmol/L (ref 3.5–5.1)
Sodium: 143 mmol/L (ref 135–145)

## 2019-03-13 MED ORDER — OXYCODONE HCL 5 MG PO TABS
5.0000 mg | ORAL_TABLET | ORAL | Status: DC | PRN
  Administered 2019-03-13 – 2019-03-15 (×6): 5 mg via ORAL
  Filled 2019-03-13 (×7): qty 1

## 2019-03-13 MED ORDER — DEXTROSE-NACL 5-0.9 % IV SOLN: INTRAVENOUS | Status: DC

## 2019-03-13 NOTE — Progress Notes (Signed)
Physical Therapy Session Note  Patient Details  Name: Debra Hopkins MRN: [id number] Date of Birth: 1941-01-10  Today's Date: 03/13/2019 PT Individual Time: 1000-1035 PT Individual Time Calculation (min): 35 min  PT Missed Time: 25 min Missed Time Reason: patient fatigue  Short Term Goals: Week 2:  PT Short Term Goal 1 (Week 2): Patient will perform bed<>chair transfers with min A. PT Short Term Goal 2 (Week 2): Patient will perform bed mobility with min A. PT Short Term Goal 3 (Week 2): Patient will perform sit<>stand with max A using LRAD.  Skilled Therapeutic Interventions/Progress Updates:    Pt received semi-reclined in bed. Per RN pt remains lethargic but ok to attempt to see patient for scheduled therapy session. Pt is minimally verbally responsive throughout session but able to nod yes/no to answer questions and state her name and request for water verbally. Pt agreeable to sit up in bed. Supine to sitting EOB with max A for LE and hip management as well as trunk control, max cueing for sequencing to assist with transfer. Pt is mod A to maintain sitting balance EOB with frequent lateral lean to the L. Pt's gown appears to be soaked so pt is agreeable to change into a clean gown. Pt is max A to doff gown and don new gown while seated EOB. Pt tolerates sitting EOB x 5 min then begins moaning out in pain. Pt reports she has "pain all over". Pt also reports feeling significantly fatigued. Per RN pt already received pain medication this AM, pt declines any other intervention. Pt is mod A to return to supine. Once in supine pt is dependent to scoot up in bed towards HOB. Pt unable to perform rolling L/R due to moaning in pain and refusal to participate in further mobility. Pt requesting water. Pt is able to hold her cup of water to drink while seated in bed with HOB elevated. Pt unable for participate functionally any further in session due to significant fatigue and ongoing pain. Pt left seated in  bed with needs is reach in care of RN changing IV. Pt missed 25 min of scheduled therapy session due to significant fatigue.  Therapy Documentation Precautions:  Precautions Precautions: Fall Precaution Comments: R AKA NWB Restrictions Weight Bearing Restrictions: Yes RLE Weight Bearing: Non weight bearing    Therapy/Group: Individual Therapy   Excell Seltzer, PT, DPT  03/13/2019, 10:40 AM

## 2019-03-13 NOTE — Progress Notes (Signed)
Occupational Therapy Session Note  Patient Details  Name: Debra Hopkins MRN: [id number] Date of Birth: 11-17-1940  Today's Date: 03/13/2019 OT Individual Time: 1400-1500 OT Individual Time Calculation (min): 60 min    Short Term Goals: Week 2:  OT Short Term Goal 1 (Week 2): Pt will maintain EOB sitting balance with mod A during simple ADL task OT Short Term Goal 2 (Week 2): Pt will complete transfer to [facility] with max +1 assist OT Short Term Goal 3 (Week 2): Pt will don shirt with min A  Skilled Therapeutic Interventions/Progress Updates:    Upon entering the room, pt supine in bed with RN and NT present. Pt having just finished having BM on bed pan and required mod - max A for rolling for hygiene and to don clean brief. OT provided support while RN performed I & O cath. Pt very upset and crying secondary to pain with bed mobility. OT providing therapeutic use of self and set up A for pt to performing grooming tasks, apply chapstick, and apply lotion to arms. Pt much calmer and agreeable to attempt sitting EOB. Max A Supine >sit but able to sit EOB with min A progressing to close supervision. Pt eating italian ice while seated EOB for 10 minutes. Sit >supine with mod A and min cuing for technique. OT assisted pt with reposition and provided encouragement. Call bell and all needed items within reach upon exiting the room.   Therapy Documentation Precautions:  Precautions Precautions: Fall Precaution Comments: R AKA NWB Restrictions Weight Bearing Restrictions: Yes RLE Weight Bearing: Non weight bearing Vital Signs: Therapy Vitals Temp: 99 F (37.2 C) Temp Source: Oral Pulse Rate: 79 Resp: 18 BP: (!) 117/51 Patient Position (if appropriate): Lying Oxygen Therapy SpO2: 98 % O2 Device: Room Air ADL: ADL Eating: Set up Where Assessed-Eating: Bed level Grooming: Setup Where Assessed-Grooming: Edge of bed Upper Body Bathing: Supervision/safety Where Assessed-Upper Body Bathing: Edge  of bed Lower Body Bathing: Moderate assistance Where Assessed-Lower Body Bathing: Edge of bed Upper Body Dressing: Supervision/safety Where Assessed-Upper Body Dressing: Edge of bed Lower Body Dressing: Maximal assistance Where Assessed-Lower Body Dressing: Bed level Toileting: Dependent Where Assessed-Toileting: Bedside Commode Toilet Transfer: Maximal assistance Toilet Transfer Method: Theatre manager: Bedside commode Vision   Perception    Praxis   Exercises:   Other Treatments:     Therapy/Group: Individual Therapy  Gypsy Decant 03/13/2019, 3:12 PM

## 2019-03-13 NOTE — Progress Notes (Signed)
[facility] PHYSICAL MEDICINE & REHABILITATION PROGRESS NOTE   Subjective/Complaints:   Pt initially was very sedated and was moaning and wouldn't wake up- reviewed meds and labs, and pt spontaneously woke up and started talking appropriately- said she feels better than yesterday.   Got pain meds at 6am- Hb down to 7.4 after wound VAC and a couple of days since transfusion- will likely drop more with fluid repletion.   ROS: Patient denies CP, shortness of breath, nausea, vomiting or diarrhea.  Objective:   No results found. Recent Labs    03/12/19 0357 03/13/19 0439  WBC 3.3* 2.4*  HGB 7.6* 7.4*  HCT 23.1* 23.3*  PLT 95* 88*   Recent Labs    03/12/19 0357 03/13/19 0439  NA 137 143  K 4.0 4.6  CL 101 107  CO2 26 26  GLUCOSE 198* 182*  BUN 75* 69*  CREATININE 1.75* 1.32*  CALCIUM 8.7* 8.8*    Intake/Output Summary (Last 24 hours) at 03/13/2019 1038 Last data filed at 03/13/2019 0700 Gross per 24 hour  Intake 780 ml  Output 1580 ml  Net -800 ml     Physical Exam: Vital Signs Blood pressure (!) 115/47, pulse 91, temperature 99.2 F (37.3 C), resp. rate 14, height 5\' 3"  (1.6 m), weight 73.3 kg, SpO2 97 %.  Constitutional: No distress . Vital signs and labs reviewed.  asleep - took awhile to wake up- however very with it when woke up HENT: Normocephalic.  Atraumatic. Eyes: EOMI. No discharge. Cardiovascular: RRR Resp: Nomal effort.  CTA B/L GI: Non-distended. NT, soft, (+) hypoactive BS Skin: pink- better color- Stage III on sacrum and stage II on B/L buttocks- in gluteal fold per staff Right AKA with dressing C/D/I; wound VAC R groin- R AKA is edematous, but wound on top almost completely healed- doesn't need dressing- staples look good with no drainage.  Psych: flat affect Musc: Right AKA with edema and tenderness Neuro: Lethargic Motor: Bilateral upper extremities: Appear to be 4+/5 proximal distal Left lower extremity: Appears to be 3/5 hip flexion, knee  extension and 4/5 ankle dorsiflexion Right lower extremity: Limited due to pain  Assessment/Plan: 1. 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  Physiatrist is providing close team supervision and 24 hour management of active medical problems listed below.  Physiatrist and rehab team continue to assess barriers to discharge/monitor patient progress toward functional and medical goals  Care Tool:  Bathing    Body parts bathed by patient: Right arm, Left arm, Chest, Face   Body parts bathed by helper: Abdomen, Front perineal area, Buttocks, Right upper leg, Left upper leg, Left lower leg Body parts n/a: Right lower leg   Bathing assist Assist Level: 2 Helpers     Upper Body Dressing/Undressing Upper body dressing   What is the patient wearing?: Hospital gown only    Upper body assist Assist Level: Moderate Assistance - Patient 50 - 74%    Lower Body Dressing/Undressing Lower body dressing      What is the patient wearing?: Hospital gown only     Lower body assist Assist for lower body dressing: Moderate Assistance - Patient 50 - 74%     Toileting Toileting    Toileting assist Assist for toileting: 2 Helpers     Transfers Chair/bed transfer  Transfers assist  Chair/bed transfer activity did not occur: Safety/medical concerns(Pt with increased pain  and LLE weakness limiting participation in therapy)  Chair/bed transfer assist level: Minimal Assistance - Patient > 75%     Locomotion Ambulation   Ambulation assist   Ambulation activity did not occur: Safety/medical concerns(Pt with increased pain and LLE weakness limiting participation in therapy)          Walk 10 feet activity   Assist  Walk 10 feet activity did not occur: Safety/medical concerns(Pt with increased pain and LLE weakness limiting participation  in therapy)        Walk 50 feet activity   Assist Walk 50 feet with 2 turns activity did not occur: Safety/medical concerns(Pt with increased pain and LLE weakness limiting participation in therapy)         Walk 150 feet activity   Assist Walk 150 feet activity did not occur: Safety/medical concerns(Pt with increased pain and LLE weakness limiting participation in therapy)         Walk 10 feet on uneven surface  activity   Assist Walk 10 feet on uneven surfaces activity did not occur: Safety/medical concerns(Pt with increased pain and LLE weakness limiting participation in therapy)         Wheelchair     Assist Will patient use wheelchair at discharge?: Yes Type of Wheelchair: Manual Wheelchair activity did not occur: Safety/medical concerns(Pt with increased pain and LLE weakness limiting participation in therapy)         Wheelchair 50 feet with 2 turns activity    Assist    Wheelchair 50 feet with 2 turns activity did not occur: Safety/medical concerns(Pt with increased pain and LLE weakness limiting participation in therapy)       Wheelchair 150 feet activity     Assist  Wheelchair 150 feet activity did not occur: Safety/medical concerns(Pt with increased pain and LLE weakness limiting participation in therapy)       Blood pressure (!) 115/47, pulse 91, temperature 99.2 F (37.3 C), resp. rate 14, height 5\' 3"  (1.6 m), weight 73.3 kg, SpO2 97 %.  1. 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 Vancomycin resumed for wound coverage 03/01/2019   Continue CIR 2. Antithrombotics:  -DVT/anticoagulation: Subcutaneous heparin- suggest Doppler of LLE due to severe pain of LLE   12/23- Dopplers of LLE (-) -antiplatelet therapy: Aspirin 81 mg daily  3. Pain Management: Lyrica 25 mg 3 times daily as needed, oxycodone as needed   lidoderm patches for L calf and coccyx;  added skelaxin for muscle pain prn; and made lyrica scheduled- probably need to increase in 2-3 days.  Tylenol scheduled 4. Mood: Ego support -antipsychotic agents: N/A  5. Neuropsych: This patient is capable of making decisions on her own behalf?.  6. Skin/Wound Care: Routine skin checks   Appreciate vascular Recs-right groin with fat necrosis and serous drainage Will likely require groin debridement and possible VAC placement this week.   12/29- OR today  12/30- has wound VAC- saw PA from vascular- she noted no changes currently  12/31- wound Care consult ordered as well as [place] at night for incontinence and air mattress  Wound care orders-  Cleanse sacral wound with NS and pat dry. Apply alginate dressing to wound bed ([facility] #625) and cover with sacral silicone foam. Change alginate daily and replace foam every three days 7. Fluids/Electrolytes/Nutrition: Routine in and outs. CMP ordered.  8. Acute blood loss anemia. CBC stable on 12/24, with decreased Hgb on 12/25, will repeat tomorrow.  12/26: Hgb with increase.  12/29- Hb down to 7.1 from 7.8- with OR today, will need transfusion.   12/30- 2 units pRBCs today  12/31- Hb up to 8.4 9. History of TIA. Continue aspirin  10. Hypertension. Clonidine 0.2 mg nightly,Cozaar 100 mg daily, Demadex 20 mg daily, Procardia XL 30 mg daily. Monitor with increased mobility.   hydralazine 25 mg nightly increased to twice daily on 12/25, changed back to nightly on 12/27  Controlled on 12/28  11- BP running really low- likely due to dehydration- will write hold/reduced dose parameters on BP meds.  11. Hypothyroidism. Synthroid  12. Diabetes mellitus peripheral neuropathy. Hemoglobin A1c 5.9. Lantus insulin 15 units nightly. Check blood sugars before meals and at bedtime. Monitor with increased mobility.  CBG (last 3)  Recent Labs    03/12/19 1620 03/12/19 2109 03/13/19 0621  GLUCAP 175* 208* 176*   Stable/little elevated on 12/30   1/2- BGs a  little better back on home Lantus dose- will be higher due to D5NS for next 24 hours- will give SSI for it. 13. Hyperlipidemia. Zocor  14. Constipation. Laxative assistance  15. GERD. Protonix  16. Thrombocytopenia-   Platelets 96 on 12/26, labs pending for today  12/29- Plts slightly better at 113k  12/31- Plts 110k after surgery 17. Leukopenia  WBCs 2.4 on 12/26, labs pending for today  12/29- WBC 1.9  12/30- up to 3k 18. Anemia-   Hemoglobin 7.8 on 12/26, labs pending for today-may require transfusion given multiple associated sequela  12/29- Hb down to 7.1- will reorder for tomorrow, -likely will need transfusion.   12/30- transfusion today 2 units  12/31- Hb 8.4 s/p transfusion  1/1- Hb  7.6 19. AKI-   Creatinine 1.15 on 12/26, labs pending for today   12/29- Cr 1.39- likely due to Vanc  12/30- Cr up to 1.54- on Ancef- could be due to NPO status all yesterday.  1/1- Cr up to 1.75 and BUN up to 75- will hold Torsemide 20 mg as well as give some IVFs and called Pharmacy to see if they think if could be Cefazolin/Ancef  as cause. Pharmacy doesn't think Ancef is the cause.  1/2- BUN down to 69 and Cr down a lot to 1.32- will give another day of D5 NS 75cc/hr since a little lethargic 20. Infection?- Vanc restarted due to increased drainage from groin:  Continue IV Vanco, critical value on 12/27  12/30 changed to Ancef yesterday- will monitor- is 2g q12  hours per pharmacy  1/1- con't same Ancef dose 21. Hypokalemia:   Potassium 3.9 on 12/26, continue to monitor  12/29- K+ 4.4 22.  Sleep disturbance  Trazodone 100 nightly-consider decreasing dose if persistently lethargic in a.m. 23.  Pancytopenia  ?  Improving, labs pending for today  12/29-a little worse- will recheck in AM- could be due to [place]   12/30- WBC up to 3.0 and plts up to 130k- could have been Vanc  12/31- WBC up to 4.5k- doing MUCH better  1/1- WBC down to 3.3-  1/2- WBC down to 2.4 and Plts down to 88k- so  variable, and running intermittent temps to 100.4 today- will call Heme/Onc- they felt like was due to underlying infection and wound VAC and asked [place] to monitor, but no intervention at this time.     LOS: 11 days A FACE TO FACE EVALUATION WAS PERFORMED  Ad Guttman 03/13/2019, 10:38 AM

## 2019-03-14 ENCOUNTER — Inpatient Hospital Stay (HOSPITAL_COMMUNITY): Payer: Medicare Other

## 2019-03-14 ENCOUNTER — Inpatient Hospital Stay (HOSPITAL_COMMUNITY): Payer: Medicare Other | Admitting: Occupational Therapy

## 2019-03-14 LAB — GLUCOSE, CAPILLARY
Glucose-Capillary: 174 mg/dL — ABNORMAL HIGH (ref 70–99)
Glucose-Capillary: 254 mg/dL — ABNORMAL HIGH (ref 70–99)
Glucose-Capillary: 216 mg/dL — ABNORMAL HIGH (ref 70–99)
Glucose-Capillary: 163 mg/dL — ABNORMAL HIGH (ref 70–99)

## 2019-03-14 LAB — BASIC METABOLIC PANEL
Anion gap: 9 (ref 5–15)
BUN: 66 mg/dL — ABNORMAL HIGH (ref 8–23)
CO2: 24 mmol/L (ref 22–32)
Calcium: 8.9 mg/dL (ref 8.9–10.3)
Chloride: 109 mmol/L (ref 98–111)
Creatinine, Ser: 1.14 mg/dL — ABNORMAL HIGH (ref 0.44–1.00)
GFR calc Af Amer: 53 mL/min — ABNORMAL LOW (ref >60)
GFR calc non Af Amer: 46 mL/min — ABNORMAL LOW (ref >60)
Glucose, Bld: 198 mg/dL — ABNORMAL HIGH (ref 70–99)
Potassium: 4.3 mmol/L (ref 3.5–5.1)
Sodium: 142 mmol/L (ref 135–145)

## 2019-03-14 LAB — CBC WITH DIFFERENTIAL/PLATELET
Abs Immature Granulocytes: 0.04 10*3/uL (ref 0.00–0.07)
Basophils Absolute: 0 10*3/uL (ref 0.0–0.1)
Basophils Relative: 1 %
Eosinophils Absolute: 0 10*3/uL (ref 0.0–0.5)
Eosinophils Relative: 1 %
HCT: 21.6 % — ABNORMAL LOW (ref 36.0–46.0)
Hemoglobin: 6.9 g/dL — CL (ref 12.0–15.0)
Immature Granulocytes: 3 %
Lymphocytes Relative: 35 %
Lymphs Abs: 0.6 10*3/uL — ABNORMAL LOW (ref 0.7–4.0)
MCH: 30.8 pg (ref 26.0–34.0)
MCHC: 31.9 g/dL (ref 30.0–36.0)
MCV: 96.4 fL (ref 80.0–100.0)
Monocytes Absolute: 0.2 10*3/uL (ref 0.1–1.0)
Monocytes Relative: 11 %
Neutro Abs: 0.8 10*3/uL — ABNORMAL LOW (ref 1.7–7.7)
Neutrophils Relative %: 49 %
Platelets: 79 10*3/uL — ABNORMAL LOW (ref 150–400)
RBC: 2.24 MIL/uL — ABNORMAL LOW (ref 3.87–5.11)
RDW: 14.2 % (ref 11.5–15.5)
Smear Review: DECREASED
WBC: 1.6 10*3/uL — ABNORMAL LOW (ref 4.0–10.5)
nRBC: 0 % (ref 0.0–0.2)

## 2019-03-14 LAB — URINALYSIS, ROUTINE W REFLEX MICROSCOPIC
Bacteria, UA: NONE SEEN
Bilirubin Urine: NEGATIVE
Glucose, UA: NEGATIVE mg/dL
Hgb urine dipstick: NEGATIVE
Ketones, ur: NEGATIVE mg/dL
Leukocytes,Ua: NEGATIVE
Nitrite: NEGATIVE
Protein, ur: 100 mg/dL — AB
Specific Gravity, Urine: 1.013 (ref 1.005–1.030)
pH: 6 (ref 5.0–8.0)

## 2019-03-14 LAB — PREPARE RBC (CROSSMATCH)

## 2019-03-14 MED ORDER — SODIUM CHLORIDE 0.9% IV SOLUTION: Freq: Once | INTRAVENOUS | Status: AC

## 2019-03-14 MED ORDER — DEXTROSE-NACL 5-0.9 % IV SOLN: INTRAVENOUS | Status: AC

## 2019-03-14 NOTE — Progress Notes (Signed)
Pt resting alert oriented skin pink warm and dry. Blood tx completed. IV order placed to flush and give lasix as ordered.

## 2019-03-14 NOTE — Progress Notes (Signed)
Physical Therapy Session Note  Patient Details  Name: Debra Hopkins MRN: [id number] Date of Birth: [birth year]  Today's Date: 03/14/2019 PT Individual Time: [id number] PT Individual Time Calculation (min): 15 min   Short Term Goals: Week 2:  PT Short Term Goal 1 (Week 2): Patient will perform bed<>chair transfers with min A. PT Short Term Goal 2 (Week 2): Patient will perform bed mobility with min A. PT Short Term Goal 3 (Week 2): Patient will perform sit<>stand with max A using LRAD.  Skilled Therapeutic Interventions/Progress Updates:     Patient in bed with RN performing in/out catheterization upon PT arrival. Patient alert and agreeable to PT session. Patient reported significant R thigh pain during session, RN aware and premedicated patient prior to session. Patient unable to rate pain due to cognitive deficits, however, showed signs of grimacing, moaning, and crying out while indicating R thigh by pointing and verbally. PT provided repositioning, rest breaks, and distraction as pain interventions throughout session. Patient declined performing bed level exercises, however, was agreeable to performing bed mobility for improved positioning for comfort and participated in diaphragmatic breathing and guided imagery for relaxation and pain management during session. Patient performed rolling to the R for PT to place 2 pillows under her trunk, pelvis, and R residual limb for pressure relief of her sacrum and comfort of L LE. Educated patient on alternating side-lying for pressure relief in the bed, patient nodded in understanding, RN made aware of position and time for pressure relief schedule. Patient in bed at end of session with breaks locked, bed alarm set, and all needs within reach. Patient missed 45 min of skilled PT due to pain and fatigue, will attempt to make up missed time as able.    Therapy Documentation Precautions:  Precautions Precautions: Fall Precaution Comments: R AKA  NWB Restrictions Weight Bearing Restrictions: Yes RLE Weight Bearing: Non weight bearing General: PT Amount of Missed Time (min): 45 Minutes PT Missed Treatment Reason: Patient fatigue;Pain    Therapy/Group: Individual Therapy  Azzie Thiem L Yamna Mackel PT, DPT  03/14/2019, 3:42 PM

## 2019-03-14 NOTE — Progress Notes (Signed)
Occupational Therapy Session Note  Patient Details  Name: Debra Hopkins MRN: [id number] Date of Birth: June 26, 1940  Today's Date: 03/14/2019 OT Individual Time: [id number] OT Individual Time Calculation (min): 42 min  and Today's Date: 03/14/2019 OT Missed Time: 18 Minutes Missed Time Reason: Patient fatigue;Pain   Short Term Goals: Week 1:  OT Short Term Goal 1 (Week 1): Pt will don pants with mod A OT Short Term Goal 1 - Progress (Week 1): Not met OT Short Term Goal 2 (Week 1): Pt will complete transfer to [facility] with max +1 OT Short Term Goal 2 - Progress (Week 1): Not met OT Short Term Goal 3 (Week 1): Pt will propel w/c 50 ft with min A to increase home accessibility OT Short Term Goal 3 - Progress (Week 1): Not met Week 2:  OT Short Term Goal 1 (Week 2): Pt will maintain EOB sitting balance with mod A during simple ADL task OT Short Term Goal 2 (Week 2): Pt will complete transfer to [facility] with max +1 assist OT Short Term Goal 3 (Week 2): Pt will don shirt with min A  Skilled Therapeutic Interventions/Progress Updates:    Upon entering the room, pt supine in bed and observed to be groaning and grimacing. OT asking pt where she hurt and she stared blankly at therapist and not answering question. Pt asked if she heard question and she reports, " yes, I just don't have an answer".Vascular arrived to remove wound vac and therapist assisted with positioning for task. RN also requesting assistance with positioning for dressing. Pt required total A +2 for positioning and rolling in bed secondary to pt being resistive to movements due to increased pain. Pt does have fever today and is scheduled to have blood transfusion later today. Pt positioned on L side with pillows utilized for comfort. Pt continues to grimace and cry out in pain. 18 missed minutes missed secondary to increase pain and medical concerns.   Therapy Documentation Precautions:  Precautions Precautions: Fall Precaution Comments: R AKA  NWB Restrictions Weight Bearing Restrictions: Yes RLE Weight Bearing: Non weight bearing General: General OT Amount of Missed Time: 18 Minutes Vital Signs: Therapy Vitals Temp: 100.2 F (37.9 C) Temp Source: Oral Pulse Rate: 79 Resp: 20 BP: (!) 133/57 Patient Position (if appropriate): Lying Oxygen Therapy SpO2: 98 % O2 Device: Room Air Pain: Pain Assessment Pain Score: 4  ADL: ADL Eating: Set up Where Assessed-Eating: Bed level Grooming: Setup Where Assessed-Grooming: Edge of bed Upper Body Bathing: Supervision/safety Where Assessed-Upper Body Bathing: Edge of bed Lower Body Bathing: Moderate assistance Where Assessed-Lower Body Bathing: Edge of bed Upper Body Dressing: Supervision/safety Where Assessed-Upper Body Dressing: Edge of bed Lower Body Dressing: Maximal assistance Where Assessed-Lower Body Dressing: Bed level Toileting: Dependent Where Assessed-Toileting: Bedside Commode Toilet Transfer: Maximal assistance Toilet Transfer Method: Theatre manager: Bedside commode Vision   Perception    Praxis   Exercises:   Other Treatments:     Therapy/Group: Individual Therapy  Gypsy Decant 03/14/2019, 9:56 AM

## 2019-03-14 NOTE — Progress Notes (Signed)
   VASCULAR SURGERY ASSESSMENT & PLAN:   POD 5 -RIGHT GROIN DEBRIDEMENT, SARTORIUS FLAP, PLACEMENT OF VAC: Patient has had some low-grade fevers.  Therefore elected to remove the VAC.  There is some odor to the wound.  We will discontinue the VAC and go to twice daily dressing changes which I have written for.  She is on IV Ancef.   SUBJECTIVE:   No specific complaints.  PHYSICAL EXAM:   Vitals:   03/13/19 1944 03/13/19 2124 03/14/19 0423 03/14/19 0820  BP: 123/72 (!) 132/44 (!) 99/53 (!) 133/57  Pulse: 86 85 73 79  Resp: 18   20  Temp: 99 F (37.2 C)  97.7 F (36.5 C) 100.2 F (37.9 C)  TempSrc: Oral  Oral Oral  SpO2: 100%  97% 98%  Weight:      Height:       The wound has good granulation tissue with some odor.  LABS:   Lab Results  Component Value Date   WBC 1.6 (L) 03/14/2019   HGB 6.9 (LL) 03/14/2019   HCT 21.6 (L) 03/14/2019   MCV 96.4 03/14/2019   PLT 79 (L) 03/14/2019   Lab Results  Component Value Date   CREATININE 1.14 (H) 03/14/2019   Lab Results  Component Value Date   INR 1.01 12/18/2016   CBG (last 3)  Recent Labs    03/13/19 1707 03/13/19 2105 03/14/19 0629  GLUCAP 183* 164* 174*    PROBLEM LIST:    Principal Problem:   S/P AKA (above knee amputation) unilateral, right (HCC) Active Problems:   Essential hypertension, benign   PVD   Anemia   Type 2 diabetes mellitus with stage 3 chronic kidney disease, without long-term current use of insulin (HCC)   Right below-knee amputee (HCC)   Pressure injury of skin   AKI (acute kidney injury) ([place])   Leukopenia   Acute blood loss anemia   Sleep disturbance   Diabetic peripheral neuropathy ([place])   Labile blood glucose   CURRENT MEDS:   . sodium chloride   Intravenous Once  . acetaminophen  650 mg Oral TID  . vitamin C  500 mg Oral BID  . aspirin EC  81 mg Oral Daily  . Chlorhexidine Gluconate Cloth  6 each Topical BID  . cloNIDine  0.2 mg Oral QHS  . docusate sodium  100 mg Oral  Daily  . feeding supplement (ENSURE ENLIVE)  237 mL Oral TID BM  . feeding supplement (PRO-STAT SUGAR FREE 64)  30 mL Oral BID  . furosemide  20 mg Intravenous Once  . furosemide  20 mg Intravenous Once  . Gerhardt's butt cream   Topical BID  . heparin  5,000 Units Subcutaneous Q8H  . hydrALAZINE  25 mg Oral QHS  . insulin aspart  0-9 Units Subcutaneous TID AC & HS  . insulin glargine  10 Units Subcutaneous QHS  . levothyroxine  50 mcg Oral QAC breakfast  . lidocaine  2 patch Transdermal Q24H  . losartan  100 mg Oral Daily  . multivitamin with minerals  1 tablet Oral Daily  . NIFEdipine  30 mg Oral Daily  . pantoprazole  40 mg Oral Daily  . pneumococcal 23 valent vaccine  0.5 mL Intramuscular Tomorrow-1000  . pregabalin  25 mg Oral TID  . simvastatin  40 mg Oral q1800  . zinc sulfate  220 mg Oral Daily    Deitra Mayo Office: [phone] 03/14/2019

## 2019-03-14 NOTE — Progress Notes (Signed)
[facility] PHYSICAL MEDICINE & REHABILITATION PROGRESS NOTE   Subjective/Complaints:   Pt didn't sleep at all last night per staff- Hb down to 6.9- will transfuse 2 units since took less than 5 days to need another transfusion. Spoke to Vascular because pt running temps in 100s-they decided to remove wound VAC and do dry dressing BID-   WBC not reliable due to running so low and no left shift- also not voiding anymore. Requiring in/out caths which is new in last week- Also has new DTI on L heel per nursing. Will check labs as well as U/A and Cx Cr back down to 1.14 and BUN down slightly to 66.  Will decrease IVFs to 60cc/hr, since Cr doing better and more awake this AM when saw her.     ROS: Patient denies CP, shortness of breath, nausea, vomiting or diarrhea.  Objective:   No results found. Recent Labs    03/13/19 0439 03/14/19 0327  WBC 2.4* 1.6*  HGB 7.4* 6.9*  HCT 23.3* 21.6*  PLT 88* 79*   Recent Labs    03/13/19 0439 03/14/19 0327  NA 143 142  K 4.6 4.3  CL 107 109  CO2 26 24  GLUCOSE 182* 198*  BUN 69* 66*  CREATININE 1.32* 1.14*  CALCIUM 8.8* 8.9    Intake/Output Summary (Last 24 hours) at 03/14/2019 1009 Last data filed at 03/14/2019 0900 Gross per 24 hour  Intake 1510 ml  Output 1500 ml  Net 10 ml     Physical Exam: Vital Signs Blood pressure (!) 133/57, pulse 79, temperature 100.2 F (37.9 C), temperature source Oral, resp. rate 20, height 5\' 3"  (1.6 m), weight 73.3 kg, SpO2 98 %.  Constitutional: No distress . Vital signs and labs reviewed. Awake but a little drowsy, when nursing giving meds 1 at a time; NAD, moaning intermittently- better than yesterday  HENT: Normocephalic.  Atraumatic. Eyes: EOMI. No discharge. Cardiovascular: RRR Resp: Nomal effort.  CTA B/L GI: Non-distended. NT, soft, (+) hypoactive BS Skin:  R groin more pink, more TTP over wound VAC-  Overall color more pale today AKA no drainage from staples- edematous.  Psych: flat  affect/a little drowsy Musc: Right AKA with edema and tenderness Neuro: Lethargic Motor: Bilateral upper extremities: Appear to be 4+/5 proximal distal Left lower extremity: Appears to be 3/5 hip flexion, knee extension and 4/5 ankle dorsiflexion Right lower extremity: Limited due to pain  Assessment/Plan: 1. 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  Physiatrist is providing close team supervision and 24 hour management of active medical problems listed below.  Physiatrist and rehab team continue to assess barriers to discharge/monitor patient progress toward functional and medical goals  Care Tool:  Bathing    Body parts bathed by patient: Right arm, Left arm, Chest, Face   Body parts bathed by helper: Abdomen, Front perineal area, Buttocks, Right upper leg, Left upper leg, Left lower leg Body parts n/a: Right lower leg   Bathing assist Assist Level: 2 Helpers     Upper Body Dressing/Undressing Upper body dressing   What is the patient wearing?: Hospital gown only    Upper body assist Assist Level: Moderate Assistance - Patient 50 - 74%    Lower Body Dressing/Undressing Lower body dressing      What is the patient wearing?: Hospital gown only     Lower body assist Assist for  lower body dressing: Moderate Assistance - Patient 50 - 74%     Toileting Toileting    Toileting assist Assist for toileting: 2 Helpers     Transfers Chair/bed transfer  Transfers assist  Chair/bed transfer activity did not occur: Safety/medical concerns(Pt with increased pain and LLE weakness limiting participation in therapy)  Chair/bed transfer assist level: Minimal Assistance - Patient > 75%     Locomotion Ambulation   Ambulation assist   Ambulation activity did not occur: Safety/medical concerns(Pt with increased pain and LLE  weakness limiting participation in therapy)          Walk 10 feet activity   Assist  Walk 10 feet activity did not occur: Safety/medical concerns(Pt with increased pain and LLE weakness limiting participation in therapy)        Walk 50 feet activity   Assist Walk 50 feet with 2 turns activity did not occur: Safety/medical concerns(Pt with increased pain and LLE weakness limiting participation in therapy)         Walk 150 feet activity   Assist Walk 150 feet activity did not occur: Safety/medical concerns(Pt with increased pain and LLE weakness limiting participation in therapy)         Walk 10 feet on uneven surface  activity   Assist Walk 10 feet on uneven surfaces activity did not occur: Safety/medical concerns(Pt with increased pain and LLE weakness limiting participation in therapy)         Wheelchair     Assist Will patient use wheelchair at discharge?: Yes Type of Wheelchair: Manual Wheelchair activity did not occur: Safety/medical concerns(Pt with increased pain and LLE weakness limiting participation in therapy)         Wheelchair 50 feet with 2 turns activity    Assist    Wheelchair 50 feet with 2 turns activity did not occur: Safety/medical concerns(Pt with increased pain and LLE weakness limiting participation in therapy)       Wheelchair 150 feet activity     Assist  Wheelchair 150 feet activity did not occur: Safety/medical concerns(Pt with increased pain and LLE weakness limiting participation in therapy)       Blood pressure (!) 133/57, pulse 79, temperature 100.2 F (37.9 C), temperature source Oral, resp. rate 20, height 5\' 3"  (1.6 m), weight 73.3 kg, SpO2 98 %.  1. 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 Vancomycin resumed for wound coverage 03/01/2019- changed to Ancef   Continue CIR 2. Antithrombotics:  -DVT/anticoagulation:  Subcutaneous heparin- suggest Doppler of LLE due to severe pain of LLE   12/23- Dopplers of LLE (-)  1/3- Plts 79k- per Heme?onc can con't until plts 50k -antiplatelet therapy: Aspirin 81 mg daily  3. Pain Management: Lyrica 25 mg 3 times daily as needed, oxycodone as needed   lidoderm patches for L calf and coccyx; added skelaxin for muscle pain prn; and made lyrica scheduled- probably need to increase in 2-3 days.  Tylenol scheduled  1/3- reduced oxy to 5 mg q4 hours prn since so sedated yesterday 4. Mood: Ego support -antipsychotic agents: N/A  5. Neuropsych: This patient is capable of making decisions on her own behalf?.  6. Skin/Wound Care: Routine skin checks   Appreciate vascular Recs-right groin with fat necrosis and serous drainage Will likely require groin debridement and possible VAC placement this week.   12/29- OR today  12/30- has wound VAC- saw PA from vascular- she noted no changes currently  12/31- wound Care consult ordered as well as [place] at night for incontinence and air mattress  Wound care orders-  Cleanse sacral wound with NS and pat dry. Apply alginate dressing to wound bed ([facility] #[id number]) and cover with sacral silicone foam. Change alginate daily and replace foam every three days  1/3- wound VAC removed and BID dressing changes ordered by Vascular.  7. Fluids/Electrolytes/Nutrition: Routine in and outs. CMP ordered.  8. Acute blood loss anemia. CBC stable on 12/24, with decreased Hgb on 12/25, will repeat tomorrow.    12/26: Hgb with increase.  12/29- Hb down to 7.1 from 7.8- with OR today, will need transfusion.   12/30- 2 units pRBCs today  12/31- Hb up to 8.4  1/3- Hb down to 6.9- esp with IVFs fluids- will replete 2 units 9. History of TIA. Continue aspirin  10. Hypertension. Clonidine 0.2 mg nightly,Cozaar 100 mg daily, Demadex 20 mg daily, Procardia XL 30 mg daily. Monitor with increased mobility.   hydralazine 25 mg nightly increased to twice daily on  12/25, changed back to nightly on 12/27  Controlled on 12/28  11- BP running really low- likely due to dehydration- will write hold/reduced dose parameters on BP meds.  11. Hypothyroidism. Synthroid  12. Diabetes mellitus peripheral neuropathy. Hemoglobin A1c 5.9. Lantus insulin 15 units nightly. Check blood sugars before meals and at bedtime. Monitor with increased mobility.  CBG (last 3)  Recent Labs    03/13/19 1707 03/13/19 2105 03/14/19 0629  GLUCAP 183* 164* 174*   Stable/little elevated on 12/30   1/3- Less than 200 13. Hyperlipidemia. Zocor  14. Constipation. Laxative assistance  15. GERD. Protonix  16. Thrombocytopenia-   Platelets 96 on 12/26, labs pending for today  12/29- Plts slightly better at 113k  12/31- Plts 110k after surgery  1/3- Plts 79k 17. Leukopenia  WBCs 2.4 on 12/26, labs pending for today  12/29- WBC 1.9  12/30- up to 3k  1/3- WBC 1.6k- no Left shift 18. Anemia-   Hemoglobin 7.8 on 12/26, labs pending for today-may require transfusion given multiple associated sequela  12/29- Hb down to 7.1- will reorder for tomorrow, -likely will need transfusion.   12/30- transfusion today 2 units  12/31- Hb 8.4 s/p transfusion  1/1- Hb  7.6  1/3- Hb 6.9- giving 2 units pRBCs 19. AKI-   Creatinine 1.15 on 12/26, labs pending for today   12/29- Cr 1.39- likely due to Vanc  12/30- Cr up to 1.54- on Ancef- could be due to NPO status all yesterday.  1/1- Cr up to 1.75 and BUN up to 75- will hold Torsemide 20 mg as well as give some IVFs and called Pharmacy to see if they think if could be Cefazolin/Ancef  as cause. Pharmacy doesn't think Ancef is the cause.  1/2- BUN down to 69 and Cr down a lot to 1.32- will give another day of D5 NS 75cc/hr since a little lethargic  1/3- Cr down to 1.14 and BUN 66- con't IVFs at 60cc/hr 20. Infection?- Vanc restarted due to increased drainage from groin:  Continue IV Vanco, critical value on 12/27  12/30 changed to Ancef  yesterday- will monitor- is 2g q12  hours per pharmacy  1/3- con't same Ancef dose per Vascular; check U/A and Cx due to urinary retention 21. Hypokalemia:   Potassium 3.9 on 12/26, continue to monitor  12/29- K+ 4.4 22.  Sleep disturbance  Trazodone 100 nightly-consider decreasing dose if persistently lethargic in a.m. 23.  Pancytopenia  ?  Improving, labs pending for today  12/29-a little worse- will recheck in AM- could be due to [place]   12/30- WBC up to 3.0 and plts up to 130k- could have been Vanc  12/31- WBC up to 4.5k- doing MUCH better  1/1- WBC down to 3.3-  1/2- WBC down to 2.4 and Plts down to 88k- so variable, and running intermittent temps to 100.4 today- will call Heme/Onc- they felt like was due to underlying infection and wound VAC and asked [place] to monitor, but no intervention at this time.  1/3- WBC down to 1.6k-   24. Urinary retention- needing in/out caths  1/3- will check U/A and Cx    LOS: 12 days A FACE TO FACE EVALUATION WAS PERFORMED  Cardarius Senat 03/14/2019, 10:09 AM

## 2019-03-14 NOTE — Progress Notes (Signed)
First unit of blood transfusion started at 1701. Pt tolerating well. Vital signs at baseline at 50min check. No signs of transfusion reaction noted. Will continue to monitor.   Gerald Stabs, RN

## 2019-03-15 ENCOUNTER — Inpatient Hospital Stay (HOSPITAL_COMMUNITY): Payer: Medicare Other | Admitting: Speech Pathology

## 2019-03-15 ENCOUNTER — Inpatient Hospital Stay (HOSPITAL_COMMUNITY)
Admission: AD | Admit: 2019-03-15 | Discharge: 2019-03-22 | DRG: 853 | Disposition: A | Discharge status: Hospice | Payer: Medicare Other | Source: Ambulatory Visit | Attending: Family Medicine | Admitting: Family Medicine

## 2019-03-15 ENCOUNTER — Inpatient Hospital Stay (HOSPITAL_COMMUNITY): Payer: Medicare Other | Admitting: Occupational Therapy

## 2019-03-15 ENCOUNTER — Inpatient Hospital Stay (HOSPITAL_COMMUNITY): Payer: Medicare Other

## 2019-03-15 DIAGNOSIS — E1122 Type 2 diabetes mellitus with diabetic chronic kidney disease: Secondary | ICD-10-CM | POA: Diagnosis present

## 2019-03-15 DIAGNOSIS — Z87442 Personal history of urinary calculi: Secondary | ICD-10-CM

## 2019-03-15 DIAGNOSIS — L97509 Non-pressure chronic ulcer of other part of unspecified foot with unspecified severity: Secondary | ICD-10-CM | POA: Diagnosis present

## 2019-03-15 DIAGNOSIS — I739 Peripheral vascular disease, unspecified: Secondary | ICD-10-CM | POA: Diagnosis present

## 2019-03-15 DIAGNOSIS — D61818 Other pancytopenia: Secondary | ICD-10-CM | POA: Diagnosis present

## 2019-03-15 DIAGNOSIS — D62 Acute posthemorrhagic anemia: Secondary | ICD-10-CM | POA: Diagnosis not present

## 2019-03-15 DIAGNOSIS — Z20822 Contact with and (suspected) exposure to covid-19: Secondary | ICD-10-CM | POA: Diagnosis present

## 2019-03-15 DIAGNOSIS — Z9842 Cataract extraction status, left eye: Secondary | ICD-10-CM

## 2019-03-15 DIAGNOSIS — E1151 Type 2 diabetes mellitus with diabetic peripheral angiopathy without gangrene: Secondary | ICD-10-CM | POA: Diagnosis present

## 2019-03-15 DIAGNOSIS — Z89611 Acquired absence of right leg above knee: Secondary | ICD-10-CM

## 2019-03-15 DIAGNOSIS — M797 Fibromyalgia: Secondary | ICD-10-CM | POA: Diagnosis present

## 2019-03-15 DIAGNOSIS — Z66 Do not resuscitate: Secondary | ICD-10-CM

## 2019-03-15 DIAGNOSIS — R6520 Severe sepsis without septic shock: Secondary | ICD-10-CM | POA: Diagnosis present

## 2019-03-15 DIAGNOSIS — Z961 Presence of intraocular lens: Secondary | ICD-10-CM | POA: Diagnosis present

## 2019-03-15 DIAGNOSIS — N1831 Chronic kidney disease, stage 3a: Secondary | ICD-10-CM | POA: Diagnosis present

## 2019-03-15 DIAGNOSIS — G9341 Metabolic encephalopathy: Secondary | ICD-10-CM | POA: Diagnosis present

## 2019-03-15 DIAGNOSIS — Z515 Encounter for palliative care: Secondary | ICD-10-CM | POA: Diagnosis present

## 2019-03-15 DIAGNOSIS — D801 Nonfamilial hypogammaglobulinemia: Secondary | ICD-10-CM | POA: Diagnosis present

## 2019-03-15 DIAGNOSIS — Z96651 Presence of right artificial knee joint: Secondary | ICD-10-CM | POA: Diagnosis present

## 2019-03-15 DIAGNOSIS — Z79899 Other long term (current) drug therapy: Secondary | ICD-10-CM

## 2019-03-15 DIAGNOSIS — D703 Neutropenia due to infection: Secondary | ICD-10-CM | POA: Diagnosis present

## 2019-03-15 DIAGNOSIS — Z823 Family history of stroke: Secondary | ICD-10-CM

## 2019-03-15 DIAGNOSIS — K219 Gastro-esophageal reflux disease without esophagitis: Secondary | ICD-10-CM | POA: Diagnosis present

## 2019-03-15 DIAGNOSIS — Z87891 Personal history of nicotine dependence: Secondary | ICD-10-CM

## 2019-03-15 DIAGNOSIS — D7581 Myelofibrosis: Secondary | ICD-10-CM | POA: Diagnosis present

## 2019-03-15 DIAGNOSIS — H3530 Unspecified macular degeneration: Secondary | ICD-10-CM | POA: Diagnosis present

## 2019-03-15 DIAGNOSIS — Z7189 Other specified counseling: Secondary | ICD-10-CM

## 2019-03-15 DIAGNOSIS — A419 Sepsis, unspecified organism: Secondary | ICD-10-CM | POA: Diagnosis present

## 2019-03-15 DIAGNOSIS — Z8249 Family history of ischemic heart disease and other diseases of the circulatory system: Secondary | ICD-10-CM

## 2019-03-15 DIAGNOSIS — I2510 Atherosclerotic heart disease of native coronary artery without angina pectoris: Secondary | ICD-10-CM | POA: Diagnosis present

## 2019-03-15 DIAGNOSIS — Z89511 Acquired absence of right leg below knee: Secondary | ICD-10-CM

## 2019-03-15 DIAGNOSIS — E1121 Type 2 diabetes mellitus with diabetic nephropathy: Secondary | ICD-10-CM | POA: Diagnosis not present

## 2019-03-15 DIAGNOSIS — L03115 Cellulitis of right lower limb: Secondary | ICD-10-CM | POA: Diagnosis present

## 2019-03-15 DIAGNOSIS — K5900 Constipation, unspecified: Secondary | ICD-10-CM | POA: Diagnosis not present

## 2019-03-15 DIAGNOSIS — I10 Essential (primary) hypertension: Secondary | ICD-10-CM | POA: Diagnosis not present

## 2019-03-15 DIAGNOSIS — Z7982 Long term (current) use of aspirin: Secondary | ICD-10-CM

## 2019-03-15 DIAGNOSIS — Z79890 Hormone replacement therapy (postmenopausal): Secondary | ICD-10-CM

## 2019-03-15 DIAGNOSIS — E038 Other specified hypothyroidism: Secondary | ICD-10-CM | POA: Diagnosis not present

## 2019-03-15 DIAGNOSIS — E039 Hypothyroidism, unspecified: Secondary | ICD-10-CM | POA: Diagnosis present

## 2019-03-15 DIAGNOSIS — I25118 Atherosclerotic heart disease of native coronary artery with other forms of angina pectoris: Secondary | ICD-10-CM | POA: Diagnosis not present

## 2019-03-15 DIAGNOSIS — Z888 Allergy status to other drugs, medicaments and biological substances status: Secondary | ICD-10-CM

## 2019-03-15 DIAGNOSIS — Z833 Family history of diabetes mellitus: Secondary | ICD-10-CM

## 2019-03-15 DIAGNOSIS — E782 Mixed hyperlipidemia: Secondary | ICD-10-CM | POA: Diagnosis present

## 2019-03-15 DIAGNOSIS — I9789 Other postprocedural complications and disorders of the circulatory system, not elsewhere classified: Secondary | ICD-10-CM | POA: Diagnosis not present

## 2019-03-15 DIAGNOSIS — I129 Hypertensive chronic kidney disease with stage 1 through stage 4 chronic kidney disease, or unspecified chronic kidney disease: Secondary | ICD-10-CM | POA: Diagnosis present

## 2019-03-15 DIAGNOSIS — Z79891 Long term (current) use of opiate analgesic: Secondary | ICD-10-CM

## 2019-03-15 DIAGNOSIS — Z8673 Personal history of transient ischemic attack (TIA), and cerebral infarction without residual deficits: Secondary | ICD-10-CM

## 2019-03-15 DIAGNOSIS — Z882 Allergy status to sulfonamides status: Secondary | ICD-10-CM

## 2019-03-15 DIAGNOSIS — Z794 Long term (current) use of insulin: Secondary | ICD-10-CM

## 2019-03-15 DIAGNOSIS — Z9841 Cataract extraction status, right eye: Secondary | ICD-10-CM

## 2019-03-15 LAB — COMPREHENSIVE METABOLIC PANEL
ALT: 15 U/L (ref 0–44)
AST: 23 U/L (ref 15–41)
Albumin: 1.6 g/dL — ABNORMAL LOW (ref 3.5–5.0)
Alkaline Phosphatase: 60 U/L (ref 38–126)
Anion gap: 9 (ref 5–15)
BUN: 53 mg/dL — ABNORMAL HIGH (ref 8–23)
CO2: 25 mmol/L (ref 22–32)
Calcium: 8.9 mg/dL (ref 8.9–10.3)
Chloride: 110 mmol/L (ref 98–111)
Creatinine, Ser: 1.06 mg/dL — ABNORMAL HIGH (ref 0.44–1.00)
GFR calc Af Amer: 58 mL/min — ABNORMAL LOW (ref >60)
GFR calc non Af Amer: 50 mL/min — ABNORMAL LOW (ref >60)
Glucose, Bld: 170 mg/dL — ABNORMAL HIGH (ref 70–99)
Potassium: 4 mmol/L (ref 3.5–5.1)
Sodium: 144 mmol/L (ref 135–145)
Total Bilirubin: 0.4 mg/dL (ref 0.3–1.2)
Total Protein: 4.5 g/dL — ABNORMAL LOW (ref 6.5–8.1)

## 2019-03-15 LAB — LACTIC ACID, PLASMA
Lactic Acid, Venous: 1.3 mmol/L (ref 0.5–1.9)
Lactic Acid, Venous: 0.7 mmol/L (ref 0.5–1.9)

## 2019-03-15 LAB — CBC WITH DIFFERENTIAL/PLATELET
Abs Immature Granulocytes: 0 10*3/uL (ref 0.00–0.07)
Basophils Absolute: 0 10*3/uL (ref 0.0–0.1)
Basophils Relative: 0 %
Eosinophils Absolute: 0 10*3/uL (ref 0.0–0.5)
Eosinophils Relative: 0 %
HCT: 26.5 % — ABNORMAL LOW (ref 36.0–46.0)
Hemoglobin: 8.8 g/dL — ABNORMAL LOW (ref 12.0–15.0)
Lymphocytes Relative: 23 %
Lymphs Abs: 0.4 10*3/uL — ABNORMAL LOW (ref 0.7–4.0)
MCH: 30.6 pg (ref 26.0–34.0)
MCHC: 33.2 g/dL (ref 30.0–36.0)
MCV: 92 fL (ref 80.0–100.0)
Monocytes Absolute: 0.1 10*3/uL (ref 0.1–1.0)
Monocytes Relative: 4 %
Neutro Abs: 1.3 10*3/uL — ABNORMAL LOW (ref 1.7–7.7)
Neutrophils Relative %: 73 %
Platelets: 74 10*3/uL — ABNORMAL LOW (ref 150–400)
RBC: 2.88 MIL/uL — ABNORMAL LOW (ref 3.87–5.11)
RDW: 15.4 % (ref 11.5–15.5)
WBC: 1.8 10*3/uL — ABNORMAL LOW (ref 4.0–10.5)
nRBC: 0 % (ref 0.0–0.2)
nRBC: 0 /100 WBC

## 2019-03-15 LAB — TYPE AND SCREEN
ABO/RH(D): A POS
Antibody Screen: NEGATIVE
Unit division: 0
Unit division: 0

## 2019-03-15 LAB — GLUCOSE, CAPILLARY
Glucose-Capillary: 138 mg/dL — ABNORMAL HIGH (ref 70–99)
Glucose-Capillary: 140 mg/dL — ABNORMAL HIGH (ref 70–99)
Glucose-Capillary: 162 mg/dL — ABNORMAL HIGH (ref 70–99)
Glucose-Capillary: 192 mg/dL — ABNORMAL HIGH (ref 70–99)

## 2019-03-15 LAB — BPAM RBC
Blood Product Expiration Date: 202101262359
Blood Product Expiration Date: 202101262359
ISSUE DATE / TIME: 202101031645
ISSUE DATE / TIME: 202101032311
Unit Type and Rh: 6200
Unit Type and Rh: 6200

## 2019-03-15 MED ORDER — ENSURE ENLIVE PO LIQD
237.0000 mL | Freq: Four times a day (QID) | ORAL | Status: DC
  Administered 2019-03-15 (×2): 237 mL via ORAL

## 2019-03-15 MED ORDER — ACETAMINOPHEN 325 MG PO TABS
650.0000 mg | ORAL_TABLET | ORAL | Status: DC | PRN
  Administered 2019-03-15: 19:00:00 650 mg via ORAL
  Filled 2019-03-15: qty 2

## 2019-03-15 MED ORDER — COLLAGENASE 250 UNIT/GM EX OINT
TOPICAL_OINTMENT | Freq: Every day | CUTANEOUS | Status: DC
  Filled 2019-03-15: qty 30

## 2019-03-15 MED ORDER — OXYCODONE HCL 5 MG PO TABS
5.0000 mg | ORAL_TABLET | ORAL | Status: DC | PRN
  Administered 2019-03-15: 5 mg via ORAL
  Filled 2019-03-15: qty 1

## 2019-03-15 MED ORDER — IOHEXOL 300 MG/ML  SOLN
100.0000 mL | Freq: Once | INTRAMUSCULAR | Status: AC | PRN
  Administered 2019-03-15: 100 mL via INTRAVENOUS

## 2019-03-15 MED ORDER — VANCOMYCIN HCL 1750 MG/350ML IV SOLN
1750.0000 mg | INTRAVENOUS | Status: DC
  Administered 2019-03-15: 21:00:00 1750 mg via INTRAVENOUS
  Filled 2019-03-15: qty 350

## 2019-03-15 MED ORDER — SODIUM CHLORIDE 0.9 % IV SOLN
2.0000 g | Freq: Two times a day (BID) | INTRAVENOUS | Status: DC
  Administered 2019-03-15: 19:00:00 2 g via INTRAVENOUS
  Filled 2019-03-15 (×2): qty 2

## 2019-03-15 MED ORDER — VANCOMYCIN HCL 1750 MG/350ML IV SOLN
1750.0000 mg | INTRAVENOUS | Status: DC
  Filled 2019-03-15: qty 350

## 2019-03-15 NOTE — Significant Event (Signed)
Rapid Response Event Note  MEWS yellow.  BP 145/55  HR 88  RR 12  O2 sat 97% on RA Temp 102.1 rectal  RN gave tylenol and placed ice packs. Already has orders for [facility] and abx. Known source of infection, has had recurrent fevers.    Recommended Lactic acid, and consult medical team.  Raliegh Ip

## 2019-03-15 NOTE — Consult Note (Signed)
WOC Nurse Consult Note: Patient receiving care in [facility] [phone]. Reason for Consult: Primary RN asked for a re-evaluation of the sacral/coccyx wound Wound type: unstageable Pressure Injury POA: No Measurement: 5 cm x 4 cm Wound bed: black, yellow, some grey Drainage (amount, consistency, odor) none Periwound: intact, hardened at the inferior border Dressing procedure/placement/frequency:  Apply Santyl to the coccyx/sacral wound in a nickel thick layer. Cover with a saline moistened gauze, then dry gauze or ABD pad.  Change daily. PT to perform after hydrotherapy, primary RN to do when PT not performing hydrotherapy. Use ONLY ONE DermaTherapy pad beneath patient's buttocks. Do NOT use disposable pads. I have requested PT to perform hydrotherapy to the area. Also, avoid the use of incontinent briefs. Val Riles, RN, MSN, CWOCN, CNS-BC, pager [phone]

## 2019-03-15 NOTE — Progress Notes (Signed)
VASCULAR SURGERY ASSESSMENT & PLAN:   POD 5 -RIGHT GROIN DEBRIDEMENT, SARTORIUS FLAP, PLACEMENT OF VAC:  I removed her VAC dressing yesterday because she had been having low-grade fevers. The wound had some odor. Today there is no odor to the wound. The hydrogel has made the wound somewhat soupy so I will switch to wet-to-dry dressing changes every 8 hours to help clean the wound up. She remains on IV Ancef.  ANEMIA: Hemoglobin is 6.9. I do not see any evidence of bleeding from her wound in the right groin. I think that addressing her anemia will will help with wound healing.  NUTRITION: Would consider nutrition consult. Certainly this could be contributing to her healing issues also.  SUBJECTIVE:   No specific complaints.  PHYSICAL EXAM:   Vitals:   03/14/19 2322 03/15/19 0002 03/15/19 0332 03/15/19 0332  BP: (!) 118/37 (!) 113/56 (!) 119/52 (!) 119/52  Pulse: 91 82 70 70  Resp: 20 20 17 17   Temp: (!) 101.2 F (38.4 C) (!) 101.3 F (38.5 C) 99.6 F (37.6 C) 99.6 F (37.6 C)  TempSrc: Oral Oral Oral Oral  SpO2: 97% 97%  98%  Weight:      Height:       I changed her dressing this morning. The hydrogel is making the wound somewhat soupy. No odor to the wound.  LABS:   Lab Results  Component Value Date   WBC 1.6 (L) 03/14/2019   HGB 6.9 (LL) 03/14/2019   HCT 21.6 (L) 03/14/2019   MCV 96.4 03/14/2019   PLT 79 (L) 03/14/2019   Lab Results  Component Value Date   CREATININE 1.06 (H) 03/15/2019   Lab Results  Component Value Date   INR 1.01 12/18/2016   CBG (last 3)  Recent Labs    03/14/19 1723 03/14/19 2120 03/15/19 0600  GLUCAP 216* 163* 138*    PROBLEM LIST:    Principal Problem:   S/P AKA (above knee amputation) unilateral, right (HCC) Active Problems:   Essential hypertension, benign   PVD   Anemia   Type 2 diabetes mellitus with stage 3 chronic kidney disease, without long-term current use of insulin (HCC)   Right below-knee amputee (HCC)  Pressure injury of skin   AKI (acute kidney injury) ([place])   Leukopenia   Acute blood loss anemia   Sleep disturbance   Diabetic peripheral neuropathy (HCC)   Labile blood glucose   CURRENT MEDS:   . sodium chloride   Intravenous Once  . acetaminophen  650 mg Oral TID  . vitamin C  500 mg Oral BID  . aspirin EC  81 mg Oral Daily  . Chlorhexidine Gluconate Cloth  6 each Topical BID  . cloNIDine  0.2 mg Oral QHS  . docusate sodium  100 mg Oral Daily  . feeding supplement (ENSURE ENLIVE)  237 mL Oral TID BM  . feeding supplement (PRO-STAT SUGAR FREE 64)  30 mL Oral BID  . furosemide  20 mg Intravenous Once  . furosemide  20 mg Intravenous Once  . Gerhardt's butt cream   Topical BID  . heparin  5,000 Units Subcutaneous Q8H  . hydrALAZINE  25 mg Oral QHS  . insulin aspart  0-9 Units Subcutaneous TID AC & HS  . insulin glargine  10 Units Subcutaneous QHS  . levothyroxine  50 mcg Oral QAC breakfast  . lidocaine  2 patch Transdermal Q24H  . losartan  100 mg Oral Daily  . multivitamin with minerals  1  tablet Oral Daily  . NIFEdipine  30 mg Oral Daily  . pantoprazole  40 mg Oral Daily  . pneumococcal 23 valent vaccine  0.5 mL Intramuscular Tomorrow-1000  . pregabalin  25 mg Oral TID  . simvastatin  40 mg Oral q1800  . zinc sulfate  220 mg Oral Daily    Deitra Mayo Office: [phone] 03/15/2019

## 2019-03-15 NOTE — Progress Notes (Signed)
Pt's wound dressing with large amount of drainage dressing change done using 3 of the 4x4 gauze dressings - hydrogel with wet to dry NS gauze covered with 4x4 and ABD pad. Pt tolerated fair. Pt found to have large amount of stool in rectum and unable to expel on own. Pt disimpacted. Dr. Dagoberto Ligas called and orders received.

## 2019-03-15 NOTE — Plan of Care (Signed)
  Problem: Consults Goal: RH LIMB LOSS PATIENT EDUCATION Description: Description: See Patient Education module for eduction specifics. Outcome: Progressing Goal: Skin Care Protocol Initiated - if Braden Score 18 or less Description: If consults are not indicated, leave blank or document N/A Outcome: Progressing Goal: Diabetes Guidelines if Diabetic/Glucose > 140 Description: If diabetic or lab glucose is > 140 mg/dl - Initiate Diabetes/Hyperglycemia Guidelines & Document Interventions  Outcome: Progressing   Problem: RH BLADDER ELIMINATION Goal: RH STG MANAGE BLADDER WITH ASSISTANCE Description: STG Manage Bladder With moderate Assistance Outcome: Progressing   Problem: RH SKIN INTEGRITY Goal: RH STG ABLE TO PERFORM INCISION/WOUND CARE W/ASSISTANCE Description: STG Able To Perform Incision/Wound Care With total Assistance. Outcome: Progressing   WOC consulted regarding coccyx wound.  Problem: RH SAFETY Goal: RH STG ADHERE TO SAFETY PRECAUTIONS W/ASSISTANCE/DEVICE Description: STG Adhere to Safety Precautions With moderate Assistance/Device. Outcome: Progressing Goal: RH STG DECREASED RISK OF FALL WITH ASSISTANCE Description: STG Decreased Risk of Fall With moderate Assistance. Outcome: Progressing   Problem: Consults Goal: Nutrition Consult-if indicated Outcome: Not Progressing   Problem: RH BOWEL ELIMINATION Goal: RH STG MANAGE BOWEL WITH ASSISTANCE Description: STG Manage Bowel with moderate Assistance. Outcome: Not Progressing   Problem: RH PAIN MANAGEMENT Goal: RH STG PAIN MANAGED AT OR BELOW PT'S PAIN GOAL Description: Patient's pain level will be <8 by discharge. Outcome: Not Progressing  Notified Dr. Ranell Patrick regarding pt pain control. New orders placed for drsg change pain management. Also paged Dr. Scot Dock office, awaiting call back. Prn meds given and some effectiveness noted. Will cont to monitor.    Problem: RH KNOWLEDGE DEFICIT LIMB LOSS Goal: RH STG INCREASE  KNOWLEDGE OF SELF CARE AFTER LIMB LOSS Description: Patient will verbalize how to promote good circulation in stump and leg. Outcome: Not Progressing

## 2019-03-15 NOTE — Progress Notes (Signed)
Speech Language Pathology Daily Session Note  Patient Details  Name: Debra Hopkins MRN: [id number] Date of Birth: September 21, 1940  Today's Date: 03/15/2019 SLP Individual Time: 1430-1440 SLP Individual Time Calculation (min): 10 min  Short Term Goals: Week 2: SLP Short Term Goal 1 (Week 2): Patient will demonstrate sustained attention to tasks for ~10 minutes with Mod A verbal cues for redirection. SLP Short Term Goal 2 (Week 2): Patient will self-monitor and correct errors during functional tasks with Mod A verbal cues. SLP Short Term Goal 3 (Week 2): Patient will recall new, daily information with use of external aids with Mod A verbal and visual cues. SLP Short Term Goal 4 (Week 2): Patient will demonstrate functional problem solving for basic and familair tasks with Min A verbal cues.  Skilled Therapeutic Interventions: Skilled treatment session focused on cognitive goals. Upon arrival, patient was awake while upright in bed. Patient was groaning throughout session due to pain and required Max verbal cues for arousal and attention. RN aware. Patient missed remaninig 20 minutes of session. Continue with current plan of care.      Pain Pain Assessment Pain Scale: 0-10 Pain Score: 9  Pain Type: Acute pain Pain Location: Groin Pain Orientation: Right Pain Descriptors / Indicators: Pressure;Throbbing Pain Intervention(s): Repositioned  Therapy/Group: Individual Therapy  Javarion Douty 03/15/2019, 3:36 PM

## 2019-03-15 NOTE — Progress Notes (Signed)
Blood tx continues pt is resting and appears comfortable at this time.

## 2019-03-15 NOTE — Progress Notes (Signed)
Physical Therapy Session Note  Patient Details  Name: Debra Hopkins MRN: [id number] Date of Birth: 10/24/1940  Today's Date: 03/15/2019 PT Individual Time: 1120-1200 PT Individual Time Calculation (min): 40 min   Short Term Goals: Week 2:  PT Short Term Goal 1 (Week 2): Patient will perform bed<>chair transfers with min A. PT Short Term Goal 2 (Week 2): Patient will perform bed mobility with min A. PT Short Term Goal 3 (Week 2): Patient will perform sit<>stand with max A using LRAD.  Skilled Therapeutic Interventions/Progress Updates:     Patient in bed upon PT arrival. Patient alert and agreeable to PT session. Patient reported 15/10 sacral and R thigh pain during session, RN made aware and stated that patient was pre-medicated prior to session. PT provided repositioning, rest breaks, cues for diaphragmatic breathing, and distraction as pain interventions throughout session.   Therapeutic Activity: Bed Mobility: Patient performed supine to/from sit with mod-max A with use of bed rails and HOB slightly elevated, patient unable to tolerate coming to sitting with her trunk forward due to R thigh pain and was returned to lying immediately. Provided verbal cues for sliding LEs off the bed and pushing through elbows to sit up. Patient performed rolling R/L with min A with use of bed rails as PT performed peri-care and replaced incontinence pad due to patient being incontinent of urine following sitting EOB. Patient expressed concern about urinary incontinence. PT provided education about her current condition and recovery and comforted patient. Patient appreciated education. She performed scooting up in bed with max A with cues for pushing with her L LE and lifting her bottom to reduce friction on her skin. Patient stated that she was hurting too much to continue with therapy and expressed concern about lying on back all the time. PT discussed with nursing and confirmed alternating rolling to R and lying  on her back for pressure relief. Place patient on her R side with pillows under her trunk and hips. Patient in bed at end of session with breaks locked, bed alarm set, and all needs within reach. Patient missed 20 min of skilled therapy due to pain.   Therapy Documentation Precautions:  Precautions Precautions: Fall Precaution Comments: R AKA NWB Restrictions Weight Bearing Restrictions: Yes RLE Weight Bearing: Non weight bearing    Therapy/Group: Individual Therapy  Mahin Guardia L Raiven Belizaire PT, DPT  03/15/2019, 3:58 PM

## 2019-03-15 NOTE — Progress Notes (Signed)
Patient  family notified [facility], [place] transfer to [id number]

## 2019-03-15 NOTE — Progress Notes (Signed)
Pt is resting with eyes closed appears comfortable. Blood tx continues.

## 2019-03-15 NOTE — Progress Notes (Signed)
Pt assessment complete, vital signs stable. Report given to transferring unit on Daralene Milch, RN. HS meds given as ordered.

## 2019-03-15 NOTE — Progress Notes (Signed)
Progress Note    03/15/2019 4:39 PM 6 Days Post-Op  Subjective: At bedside today to review wound care with tendon origin.  RN and nursing assistant have her rolled on her left side for an enema.  Erythema of the right hip proximal thigh area noted   Vitals:   03/15/19 0332 03/15/19 1409  BP: (!) 119/52 (!) 131/57  Pulse: 70 73  Resp: 17 14  Temp: 99.6 F (37.6 C) 98.1 F (36.7 C)  SpO2: 98% 100%    Physical Exam: Patient is awake alert.  In pain secondary to enema Incisions: Erythema noted of right hip and proximal thigh.  Margins outlined in ink.  Right groin wound dressing removed.  Clear drainage.  Several areas of necrotic fat.  Muscle appears viable. no purulence Extremities: Right AKA staple line is well approximated.  Residual limb is edematous.  Incision is dry and there is no erythema.         CBC    Component Value Date/Time   WBC 1.8 (L) 03/15/2019 0544   RBC 2.88 (L) 03/15/2019 0544   HGB 8.8 (L) 03/15/2019 0544   HCT 26.5 (L) 03/15/2019 0544   PLT 74 (L) 03/15/2019 0544   MCV 92.0 03/15/2019 0544   MCH 30.6 03/15/2019 0544   MCHC 33.2 03/15/2019 0544   RDW 15.4 03/15/2019 0544   LYMPHSABS 0.4 (L) 03/15/2019 0544   MONOABS 0.1 03/15/2019 0544   EOSABS 0.0 03/15/2019 0544   BASOSABS 0.0 03/15/2019 0544    BMET    Component Value Date/Time   NA 144 03/15/2019 0544   NA 137 11/04/2018 1132   K 4.0 03/15/2019 0544   CL 110 03/15/2019 0544   CO2 25 03/15/2019 0544   GLUCOSE 170 (H) 03/15/2019 0544   BUN 53 (H) 03/15/2019 0544   BUN 24 11/04/2018 1132   CREATININE 1.06 (H) 03/15/2019 0544   CALCIUM 8.9 03/15/2019 0544   GFRNONAA 50 (L) 03/15/2019 0544   GFRAA 58 (L) 03/15/2019 0544     Intake/Output Summary (Last 24 hours) at 03/15/2019 1639 Last data filed at 03/15/2019 1424 Gross per 24 hour  Intake 1330 ml  Output 751 ml  Net 579 ml    HOSPITAL MEDICATIONS Scheduled Meds: . sodium chloride   Intravenous Once  . acetaminophen  650  mg Oral TID  . vitamin C  500 mg Oral BID  . aspirin EC  81 mg Oral Daily  . Chlorhexidine Gluconate Cloth  6 each Topical BID  . cloNIDine  0.2 mg Oral QHS  . collagenase   Topical Daily  . docusate sodium  100 mg Oral Daily  . feeding supplement (ENSURE ENLIVE)  237 mL Oral QID  . feeding supplement (PRO-STAT SUGAR FREE 64)  30 mL Oral BID  . furosemide  20 mg Intravenous Once  . furosemide  20 mg Intravenous Once  . Gerhardt's butt cream   Topical BID  . heparin  5,000 Units Subcutaneous Q8H  . hydrALAZINE  25 mg Oral QHS  . insulin aspart  0-9 Units Subcutaneous TID AC & HS  . insulin glargine  10 Units Subcutaneous QHS  . levothyroxine  50 mcg Oral QAC breakfast  . lidocaine  2 patch Transdermal Q24H  . losartan  100 mg Oral Daily  . multivitamin with minerals  1 tablet Oral Daily  . NIFEdipine  30 mg Oral Daily  . pantoprazole  40 mg Oral Daily  . pneumococcal 23 valent vaccine  0.5 mL Intramuscular Tomorrow-1000  .  pregabalin  25 mg Oral TID  . simvastatin  40 mg Oral q1800  . zinc sulfate  220 mg Oral Daily   Continuous Infusions: .  ceFAZolin (ANCEF) IV Stopped (03/15/19 1250)  . dextrose 5 % and 0.9% NaCl 75 mL/hr at 03/15/19 0824   PRN Meds:.bisacodyl, diphenhydrAMINE, EPINEPHrine, guaiFENesin-dextromethorphan, metaxalone, oxyCODONE, polyethylene glycol, sodium chloride flush, sorbitol  Assessment:  79 y.o. 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 Right groin incision with fat necrosis requiring debridement and myofascial flap closure 6 days ago.  Now with new erythema of the right thigh.  Discussed with medical service.  Locating the picture is her history of bone marrow disease, anemia and recent neutropenia.    Plan: - Will broaden antibiotics.  Call to pharmacist regarding covering for MRSA and history of acute kidney  injury.  They will consult and manage dosing.  New cultures obtained of right groin wound.  Continue wound care.  Will obtain noncontrasted CT of the right hemipelvis, hip and right residual limb.  Her son, Claudie Leach present at bedside today and is updated. 6 Days Post-Op  -DVT prophylaxis:  heparin   Risa Grill, PA-C [facility] [phone] 03/15/2019  4:39 PM

## 2019-03-15 NOTE — Progress Notes (Signed)
Nutrition Follow-up/Consult  RD working remotely.  DOCUMENTATION CODES:   Not applicable  INTERVENTION:   Pt with very poor PO intake at meals (mostly 0%). Recommend considering Cortrak NG tube placement and initiation of enteral nutrition as wounds are unlikely to heal without increased kcal and protein which can be provided via nutrition support. Recommend: - Osmolite 1.2 @ 55 ml/hr (1320 ml/day) - Pro-stat 30 ml BID per tube  Recommended tube feeding regimen provides 1784 kcal, 103 grams of protein, and 1082 ml of H2O (100% kcal needs, 100% protein needs).  - Liberalize diet to Regular to promote PO intake at meals  -Increase Ensure Enlive po to QID, each supplement provides 350 kcal and 20 grams of protein  - Continue Pro-stat 30 ml po BID, each supplement provides 100 kcal and 15 grams of protein  - Continue MVI with minerals daily  - Add gravy to meats with meals   - Encourage adequate PO intake  NUTRITION DIAGNOSIS:   Increased nutrient needs related to wound healing as evidenced by estimated needs.  Ongoing, being addressed via supplements  GOAL:   Patient will meet greater than or equal to 90% of their needs  Unmet at this time  MONITOR:   PO intake, Supplement acceptance, Labs, Weight trends, Skin  REASON FOR ASSESSMENT:   Consult Wound healing  ASSESSMENT:   79 year old female with PMH of CAD, DM, TIA, fibromyalgia, macular degeneration, PVD with multiple revascularization procedures. Presented on 02/15/19 with chronic RLE ischemic changes and toe ulceration. Recent aortogram showed aortic and iliac segments heavily calcified. 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 Hospital course complicated by pain due to profound ischemic changes of pt's foot  which was felt to be nonviable and pt underwent right AKA on 02/22/19. Pt admitted to CIR on 03/02/19.  12/29 - s/p I&D of right groin, placement of wound VAC 01/03 - VAC removed  Noted planned d/c date of 1/18.  Per RN note on 1/1, pt with poor appetite but drinking 100% of Ensure supplements.  Per [facility] note today, plan is for hydrotherapy for sacral/coccyx wound. Per WOC, wounds are unstageable.  Unable to reach pt via phone call to room.  Pt with very poor PO intake at meals (mostly 0%). Recommend considering Cortrak NG tube placement and initiation of enteral nutrition as wounds are unlikely to heal without increased kcal and protein which can be provided via nutrition support.  Per RN edema assessment, pt with deep pitting edema to RLE.  Meal Completion: 0-50% x last 8 meals  Medications reviewed and include: vitamin C 500 mg BID, colace, Ensure Enlive TID, Pro-stat BID, SSI, Lantus 10 units daily, MVI with minerals, protonix, zinc sulfate 220 mg daily, IV abx IVF: D5 in NS @ 75 ml/hr  Labs reviewed: hemoglobin 8.8 CBG's: 138-216 x 24 hours  UOP: 1100 ml x 24 hours  Diet Order:   Diet Order            Diet regular Room service appropriate? Yes; Fluid consistency: Thin  Diet effective now              EDUCATION NEEDS:   Education needs have been addressed  Skin:  Skin Assessment: Skin Integrity Issues: DTI: left heel Stage I: N/A Stage II: bilateral buttocks, bilateral coccyx (unstageable per WOC note) Wound Vac:  N/A Incisions: right groin, thigh  Last BM:  03/15/19 large type 4  Height:   Ht Readings from Last 1 Encounters:  03/09/19 5\' 3"  (1.6 m)    Weight:   Wt Readings from Last 1 Encounters:  03/09/19 73.3 kg    Ideal Body Weight:  48.1 kg (adjusted for AKA)  BMI:  Body mass index is 28.63 kg/m.  Estimated Nutritional Needs:   Kcal:  1700-1900  Protein:  100-115 grams  Fluid:  1.7-1.9 L    Gaynell Face, MS, RD,  LDN Inpatient Clinical Dietitian Pager: [phone] Weekend/After Hours: [phone]

## 2019-03-15 NOTE — Progress Notes (Signed)
Occupational Therapy Session Note  Patient Details  Name: Debra Hopkins MRN: [id number] Date of Birth: 09-Nov-1940  Today's Date: 03/15/2019 OT Individual Time: [id number] OT Individual Time Calculation (min): 31 min    Short Term Goals: Week 2:  OT Short Term Goal 1 (Week 2): Pt will maintain EOB sitting balance with mod A during simple ADL task OT Short Term Goal 2 (Week 2): Pt will complete transfer to [facility] with max +1 assist OT Short Term Goal 3 (Week 2): Pt will don shirt with min A  Skilled Therapeutic Interventions/Progress Updates:    Patient alert, confusion noted related to medical issues but she is aware of month/year and that she is at [facility].  Attempted to move to edge of bed for upright sitting but she is unable to tolerate.  Completed trunk mobility and forward sit into long sit from elevated head of bed position using bed rails for support.  She is able to complete UB and left LE AROM activities with increased time.  Completed rolling in each direction with repetition and increased time - mod A level.  Remained in side lying on left side at close of session, bed alarm set and call bell in reach.    Therapy Documentation Precautions:  Precautions Precautions: Fall Precaution Comments: R AKA NWB Restrictions Weight Bearing Restrictions: Yes RLE Weight Bearing: Non weight bearing General:   Vital Signs: Therapy Vitals Temp: 98.1 F (36.7 C) Pulse Rate: 73 Resp: 14 BP: (!) 131/57 Patient Position (if appropriate): Lying Oxygen Therapy SpO2: 100 % O2 Device: Room Air Pain: Pain Assessment Pain Scale: 0-10 Pain Score: 9  Pain Type: Acute pain Pain Location: Groin Pain Orientation: Right Pain Descriptors / Indicators: Pressure;Throbbing Pain Intervention(s): Repositioned   Other Treatments:     Therapy/Group: Individual Therapy  Carlos Levering 03/15/2019, 3:27 PM

## 2019-03-15 NOTE — Progress Notes (Signed)
Occupational Therapy Session Note  Patient Details  Name: Debra Hopkins MRN: [id number] Date of Birth: 1940-05-18  Today's Date: 03/15/2019 OT Individual Time: [id number] OT Individual Time Calculation (min): 25 min  Missed 35 minutes due to limited responsiveness, fatigue and pain  Short Term Goals: Week 2:  OT Short Term Goal 1 (Week 2): Pt will maintain EOB sitting balance with mod A during simple ADL task OT Short Term Goal 2 (Week 2): Pt will complete transfer to [facility] with max +1 assist OT Short Term Goal 3 (Week 2): Pt will don shirt with min A  Skilled Therapeutic Interventions/Progress Updates:    patient in bed, eyes closed, soft moaning.  occ verbal responses that are appropriate to questions but keeps eyes closed 90%.  States that she does not want to eat.   She states that she is having pain in her back but unable to specify.  Completed repositioning in bed, light PROM bilateral UEs, and left LE with grimacing - unable to engage patient in AAROM or AROM activities.  She washes her face when presented with a warm wash cloth.  Oral care completed with oral gel on swab - patient requests repeat but will not hold the swab herself.  Missed 35 minutes of therapy due to limited response/participation and ongoing indication of pain, nursing aware.  Will attempt to make up missed time as tolerated.    Therapy Documentation Precautions:  Precautions Precautions: Fall Precaution Comments: R AKA NWB Restrictions Weight Bearing Restrictions: Yes RLE Weight Bearing: Non weight bearing General: General OT Amount of Missed Time: 35 Minutes Vital Signs:  Pain: Pain Assessment Pain Scale: 0-10 Pain Score: 10-Worst pain ever Pain Type: Acute pain Pain Location: Groin Pain Orientation: Right Pain Descriptors / Indicators: Aching;Grimacing;Moaning Pain Frequency: Constant Pain Onset: On-going Patients Stated Pain Goal: 7 Pain Intervention(s): Medication (See  eMAR);Repositioned;Relaxation PAINAD (Pain Assessment in Advanced Dementia) Breathing: normal Negative Vocalization: occasional moan/groan, low speech, negative/disapproving quality Facial Expression: facial grimacing Body Language: relaxed Consolability: distracted or reassured by voice/touch PAINAD Score: 4   Other Treatments:     Therapy/Group: Individual Therapy  Carlos Levering 03/15/2019, 9:33 AM

## 2019-03-15 NOTE — Progress Notes (Addendum)
[facility] PHYSICAL MEDICINE & REHABILITATION PROGRESS NOTE   Subjective/Complaints: Patient was whimpering in pain with changing of wound dressing this morning.  Awake, and nods head when asked about pain. Could not indicate when she last received her pain medications. As per chart review, she received none this morning prior to dressing change. First dose oxycodone given at 8:51am.  Labs stable.   ROS: Patient denies CP, shortness of breath, nausea, vomiting or diarrhea.  Objective:   No results found. Recent Labs    03/14/19 0327 03/15/19 0544  WBC 1.6* 1.8*  HGB 6.9* 8.8*  HCT 21.6* 26.5*  PLT 79* 74*   Recent Labs    03/14/19 0327 03/15/19 0544  NA 142 144  K 4.3 4.0  CL 109 110  CO2 24 25  GLUCOSE 198* 170*  BUN 66* 53*  CREATININE 1.14* 1.06*  CALCIUM 8.9 8.9    Intake/Output Summary (Last 24 hours) at 03/15/2019 0851 Last data filed at 03/15/2019 0845 Gross per 24 hour  Intake 1450 ml  Output 1100 ml  Net 350 ml     Physical Exam: Vital Signs Blood pressure (!) 119/52, pulse 70, temperature 99.6 F (37.6 C), temperature source Oral, resp. rate 17, height 5\' 3"  (1.6 m), weight 73.3 kg, SpO2 98 %.  Constitutional: No distress . Vital signs and labs reviewed. Awake but a little drowsy, when nursing giving meds 1 at a time; NAD, moaning intermittently- better than yesterday  HENT: Normocephalic.  Atraumatic. Eyes: EOMI. No discharge. Cardiovascular: RRR Resp: Nomal effort.  CTA B/L GI: Non-distended. NT, soft, (+) hypoactive BS Skin:  R groin more pink, more TTP over wound VAC-  Overall color more pale today AKA no drainage from staples- edematous.  Psych: flat affect/a little drowsy Musc: Right AKA with edema and tenderness Neuro: Whimpering in pain from pressure ulcer dressing change.  Motor: Bilateral upper extremities: Appear to be 4+/5 proximal distal Left lower extremity: Appears to be 3/5 hip flexion, knee extension and 4/5 ankle  dorsiflexion Right lower extremity: Limited due to pain  Assessment/Plan: 1. 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  Physiatrist is providing close team supervision and 24 hour management of active medical problems listed below.  Physiatrist and rehab team continue to assess barriers to discharge/monitor patient progress toward functional and medical goals  Care Tool:  Bathing    Body parts bathed by patient: Right arm, Left arm, Chest, Face   Body parts bathed by helper: Abdomen, Front perineal area, Buttocks, Right upper leg, Left upper leg, Left lower leg Body parts n/a: Right lower leg   Bathing assist Assist Level: 2 Helpers     Upper Body Dressing/Undressing Upper body dressing   What is the patient wearing?: Hospital gown only    Upper body assist Assist Level: Moderate Assistance - Patient 50 - 74%    Lower Body Dressing/Undressing Lower body dressing      What is the patient wearing?: Hospital gown only     Lower body assist Assist for lower body dressing: Moderate Assistance - Patient 50 - 74%     Toileting Toileting    Toileting assist Assist for toileting: 2 Helpers     Transfers Chair/bed transfer  Transfers assist  Chair/bed transfer activity did not occur: Safety/medical concerns(Pt with increased pain and LLE weakness limiting participation in therapy)  Chair/bed transfer assist level: Minimal Assistance -  Patient > 75%     Locomotion Ambulation   Ambulation assist   Ambulation activity did not occur: Safety/medical concerns(Pt with increased pain and LLE weakness limiting participation in therapy)          Walk 10 feet activity   Assist  Walk 10 feet activity did not occur: Safety/medical concerns(Pt with increased pain and LLE weakness limiting participation in therapy)         Walk 50 feet activity   Assist Walk 50 feet with 2 turns activity did not occur: Safety/medical concerns(Pt with increased pain and LLE weakness limiting participation in therapy)         Walk 150 feet activity   Assist Walk 150 feet activity did not occur: Safety/medical concerns(Pt with increased pain and LLE weakness limiting participation in therapy)         Walk 10 feet on uneven surface  activity   Assist Walk 10 feet on uneven surfaces activity did not occur: Safety/medical concerns(Pt with increased pain and LLE weakness limiting participation in therapy)         Wheelchair     Assist Will patient use wheelchair at discharge?: Yes Type of Wheelchair: Manual Wheelchair activity did not occur: Safety/medical concerns(Pt with increased pain and LLE weakness limiting participation in therapy)         Wheelchair 50 feet with 2 turns activity    Assist    Wheelchair 50 feet with 2 turns activity did not occur: Safety/medical concerns(Pt with increased pain and LLE weakness limiting participation in therapy)       Wheelchair 150 feet activity     Assist  Wheelchair 150 feet activity did not occur: Safety/medical concerns(Pt with increased pain and LLE weakness limiting participation in therapy)       Blood pressure (!) 119/52, pulse 70, temperature 99.6 F (37.6 C), temperature source Oral, resp. rate 17, height 5\' 3"  (1.6 m), weight 73.3 kg, SpO2 98 %.  1. 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 Vancomycin resumed for wound coverage 03/01/2019- changed to Ancef   Continue CIR 2. Antithrombotics:  -DVT/anticoagulation: Subcutaneous heparin- suggest Doppler of LLE due to severe pain of LLE   12/23- Dopplers of LLE (-)  1/3- Plts 79k- per Heme?onc can con't until plts 50k  1/4: Platelets 74 today -antiplatelet therapy: Aspirin 81 mg daily  3. Pain  Management: Lyrica 25 mg 3 times daily as needed, oxycodone as needed   lidoderm patches for L calf and coccyx; added skelaxin for muscle pain prn; and made lyrica scheduled- probably need to increase in 2-3 days.  Tylenol scheduled  1/3- reduced oxy to 5 mg q4 hours prn since so sedated yesterday  1/4: Patient was in a lot of pain this morning during dressing change and had not received any pain medications prior. I will place nursing order to request that prn oxycodone be given 30 minutes prior to dressing change to minimize pain.  4. Mood: Ego support -antipsychotic agents: N/A  5. Neuropsych: This patient is capable of making decisions on her own behalf?.  6. Skin/Wound Care: Routine skin checks   Appreciate vascular Recs-right groin with fat necrosis and serous drainage Will likely require groin debridement and possible VAC placement this week.   12/29- OR today  12/30- has wound VAC- saw PA from vascular- she noted no changes currently  12/31- wound Care consult ordered as well as [place] at night for incontinence and  air mattress  Wound care orders-  Cleanse sacral wound with NS and pat dry. Apply alginate dressing to wound bed ([facility]WSON #[id number]) and cover with sacral silicone foam. Change alginate daily and replace foam every three days  1/3- wound VAC removed and BID dressing changes ordered by Vascular.   1/4: appreciate vascular follow-up. Wound care has not signed off. Will order nutrition consult as recommended by Dr. Scot Dock.  7. Fluids/Electrolytes/Nutrition: Routine in and outs. CMP ordered.  8. Acute blood loss anemia. CBC stable on 12/24, with decreased Hgb on 12/25, will repeat tomorrow.    12/26: Hgb with increase.  12/29- Hb down to 7.1 from 7.8- with OR today, will need transfusion.   12/30- 2 units pRBCs today  12/31- Hb up to 8.4  1/3- Hb down to 6.9- esp with IVFs fluids- will replete 2 units 9. History of TIA. Continue aspirin  10. Hypertension. Clonidine 0.2 mg  nightly,Cozaar 100 mg daily, Demadex 20 mg daily, Procardia XL 30 mg daily. Monitor with increased mobility.   hydralazine 25 mg nightly increased to twice daily on 12/25, changed back to nightly on 12/27  Controlled on 12/28  11- BP running really low- likely due to dehydration- will write hold/reduced dose parameters on BP meds.  11. Hypothyroidism. Synthroid  12. Diabetes mellitus peripheral neuropathy. Hemoglobin A1c 5.9. Lantus insulin 15 units nightly. Check blood sugars before meals and at bedtime. Monitor with increased mobility.  CBG (last 3)  Recent Labs    03/14/19 1723 03/14/19 2120 03/15/19 0600  GLUCAP 216* 163* 138*   Stable/little elevated on 12/30   1/3- Less than 200 13. Hyperlipidemia. Zocor  14. Constipation. Laxative assistance  15. GERD. Protonix  16. Thrombocytopenia-   Platelets 96 on 12/26, labs pending for today  12/29- Plts slightly better at 113k  12/31- Plts 110k after surgery  1/3- Plts 79k 17. Leukopenia  WBCs 2.4 on 12/26, labs pending for today  12/29- WBC 1.9  12/30- up to 3k  1/3- WBC 1.6k- no Left shift 18. Anemia-   Hemoglobin 7.8 on 12/26, labs pending for today-may require transfusion given multiple associated sequela  12/29- Hb down to 7.1- will reorder for tomorrow, -likely will need transfusion.   12/30- transfusion today 2 units  12/31- Hb 8.4 s/p transfusion  1/1- Hb  7.6  1/3- Hb 6.9- giving 2 units pRBCs  1/4: Hgb 8.8 after receiving 2U PRBCs yesterday.  19. AKI-   Creatinine 1.15 on 12/26, labs pending for today   12/29- Cr 1.39- likely due to Vanc  12/30- Cr up to 1.54- on Ancef- could be due to NPO status all yesterday.  1/1- Cr up to 1.75 and BUN up to 75- will hold Torsemide 20 mg as well as give some IVFs and called Pharmacy to see if they think if could be Cefazolin/Ancef  as cause. Pharmacy doesn't think Ancef is the cause.  1/2- BUN down to 69 and Cr down a lot to 1.32- will give another day of D5 NS 75cc/hr since a  little lethargic  1/3- Cr down to 1.14 and BUN 66- con't IVFs at 60cc/hr  1/4: Cr down to 1.06.  20. Infection?- Vanc restarted due to increased drainage from groin:  Continue IV Vanco, critical value on 12/27  12/30 changed to Ancef yesterday- will monitor- is 2g q12  hours per pharmacy  1/3- con't same Ancef dose per Vascular; check U/A and Cx due to urinary retention  1/4: UA negative.  21. Hypokalemia:  Potassium 3.9 on 12/26, continue to monitor  12/29- K+ 4.4  1/4: K+ 4.0 22.  Sleep disturbance  Trazodone 100 nightly-consider decreasing dose if persistently lethargic in a.m.  1/4: Not lethargic this AM.  23.  Pancytopenia  ?  Improving, labs pending for today  12/29-a little worse- will recheck in AM- could be due to [place]   12/30- WBC up to 3.0 and plts up to 130k- could have been Vanc  12/31- WBC up to 4.5k- doing MUCH better  1/1- WBC down to 3.3-  1/2- WBC down to 2.4 and Plts down to 88k- so variable, and running intermittent temps to 100.4 today- will call Heme/Onc- they felt like was due to underlying infection and wound VAC and asked [place] to monitor, but no intervention at this time.  1/3- WBC down to 1.6k-   24. Urinary retention- needing in/out caths  1/3- will check U/A and Cx  1/4: UA negative.     LOS: 13 days A FACE TO FACE EVALUATION WAS PERFORMED  Clide Deutscher Earle Burson 03/15/2019, 8:51 AM

## 2019-03-15 NOTE — Progress Notes (Signed)
Updated by nurse regarding recurrent fevers as well as issues with wound. Vascular note reviewed and note that antibiotic course being broadened as well as CT pelvis ordered. Frequent vitals and BC X 2 ordered. Labs this am show pancytopenia with ongoing AKI due to poor po intake.  Rapid questioned need for transfer to acute and reached out to hospitalist.  Dr. Jonelle Sidle updated on events with evidence of sepsis. He agreed to admit patient to acute for closer monitoring and for further work up.

## 2019-03-15 NOTE — Consult Note (Signed)
WOC Nurse Consult Note: Patient receiving care in [facility] [phone]. I verified with Dr. Scot Dock, via telephone, that he does NOT want VAC therapy restarted to the right groin wound.  WOC consult completed. We will not follow. [place] nurse will not follow at this time.  Please re-consult the [place] team if needed.  Val Riles, RN, MSN, CWOCN, CNS-BC, pager [phone]

## 2019-03-15 NOTE — Progress Notes (Signed)
Pharmacy Antibiotic Note  Debra Hopkins is a 79 y.o. female admitted on 02/15/19 with ischemic R foot and toe ulceration; pt underwent stenting of R SFA and popliteal arteries by Vascular Surgery on 02/15/19. 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 Pt was transferred to CIR on 03/02/19. Right groin infusion with fat necrosis requiring debridement and myofascial flap closure 6 days ago. Pharmacy has been consulted for cefepime and vancomycin dosing for R groin incision/surgical site infection.  Pt rec'd vancomycin from during acute stay and from 12.22-12/27 in [facility]; vancomycin was changed to cefazolin on 12/29. Pt also rec'd one dose of PO amoxicillin for PCN allergy testing on 12/31; pt tolerated the challenge with no issues, so PCN allergy removed from pt's allergy profile.  Pt experienced AKI; Scr was 1.15 on 12/26 and increased to 1.75 on 03/12/19; Scr down to 1.06 today; CrCl 41.3 ml/min.  WBC 1.8K, Tmax 101.36F  Plan: Cefepime 2 gm IV Q 12 hrs Vancomycin 1750 mg IV Q 48 hrs (estimated vancomycin AUC on this regimen, using Scr 1.06, is 488.4; goal AUC is 400-550) Monitor renal function, WBC, temp, clinical improvement, cultures, vancomycin levels as indicated  Height: 5\' 3"  (160 cm) Weight: 161 lb 9.6 oz (73.3 kg) IBW/kg (Calculated) : 52.4  Temp (24hrs), Avg:99.5 F (37.5 C), Min:98.1 F (36.7 C), Max:101.3 F (38.5 C)  Recent Labs  Lab 03/10/19 0453 03/11/19 0549 03/12/19 0357 03/13/19 0439 03/14/19 0327 03/15/19 0544  WBC 3.0* 4.5 3.3* 2.4* 1.6* 1.8*  CREATININE 1.54*  --  1.75* 1.32* 1.14* 1.06*    Estimated Creatinine Clearance: 41.3 mL/min (A) (by C-G formula based on SCr of 1.06 mg/dL (H)).    Allergies  Allergen Reactions  . Byetta 10 Mcg Pen [Exenatide] Other (See Comments)    Unknown  . Ceclor [Cefaclor] Other (See Comments)    Unknown  . Celexa [Citalopram Hydrobromide]  Other (See Comments)    Sick  . Ciprofloxacin Nausea Only and Other (See Comments)    Dizziness  . Claritin [Loratadine] Other (See Comments)    Hair fell out  . Erythromycin Other (See Comments)    REACTION: Upset stomach  . Flexeril [Cyclobenzaprine] Other (See Comments)    Nervous  . Fosamax [Alendronate] Other (See Comments)    Made pt sick  . Jardiance [Empagliflozin] Other (See Comments)    Vaginal infection, UTI  . Motrin [Ibuprofen] Nausea Only  . Naproxen Other (See Comments)    REACTION: severe nausea  . Norvasc [Amlodipine Besylate] Other (See Comments)    Unknown  . Prednisone Other (See Comments)    REACTION: itchy rash  . Prozac [Fluoxetine Hcl] Other (See Comments)    Sick  . Sulfamethoxazole-Trimethoprim Nausea And Vomiting and Other (See Comments)    REACTION: Upset stomach    Microbiology results: 12/9 UCx: NG/final 1/3 UCx: pending 1/4 R groin superficial culture: pending 12/8, 12.9 MRSA PCR: negative 12/3 COVID: negative  Thank you for allowing pharmacy to be a part of this patient's care.  Gillermina Hu, PharmD, BCPS, [facility] Clinical Pharmacist 03/15/2019 5:20 PM

## 2019-03-16 ENCOUNTER — Inpatient Hospital Stay (HOSPITAL_COMMUNITY): Payer: Medicare Other | Admitting: Occupational Therapy

## 2019-03-16 ENCOUNTER — Inpatient Hospital Stay (HOSPITAL_COMMUNITY): Payer: Medicare Other

## 2019-03-16 ENCOUNTER — Inpatient Hospital Stay (HOSPITAL_COMMUNITY): Payer: Medicare Other | Admitting: Speech Pathology

## 2019-03-16 DIAGNOSIS — I10 Essential (primary) hypertension: Secondary | ICD-10-CM

## 2019-03-16 DIAGNOSIS — E038 Other specified hypothyroidism: Secondary | ICD-10-CM

## 2019-03-16 DIAGNOSIS — I739 Peripheral vascular disease, unspecified: Secondary | ICD-10-CM

## 2019-03-16 DIAGNOSIS — N1831 Chronic kidney disease, stage 3a: Secondary | ICD-10-CM

## 2019-03-16 DIAGNOSIS — E1121 Type 2 diabetes mellitus with diabetic nephropathy: Secondary | ICD-10-CM

## 2019-03-16 DIAGNOSIS — A419 Sepsis, unspecified organism: Principal | ICD-10-CM

## 2019-03-16 DIAGNOSIS — Z89611 Acquired absence of right leg above knee: Secondary | ICD-10-CM

## 2019-03-16 DIAGNOSIS — K219 Gastro-esophageal reflux disease without esophagitis: Secondary | ICD-10-CM

## 2019-03-16 DIAGNOSIS — I25118 Atherosclerotic heart disease of native coronary artery with other forms of angina pectoris: Secondary | ICD-10-CM

## 2019-03-16 LAB — CBC WITH DIFFERENTIAL/PLATELET
Abs Immature Granulocytes: 0.03 10*3/uL (ref 0.00–0.07)
Basophils Absolute: 0 10*3/uL (ref 0.0–0.1)
Basophils Relative: 1 %
Eosinophils Absolute: 0 10*3/uL (ref 0.0–0.5)
Eosinophils Relative: 1 %
HCT: 27.6 % — ABNORMAL LOW (ref 36.0–46.0)
Hemoglobin: 8.9 g/dL — ABNORMAL LOW (ref 12.0–15.0)
Immature Granulocytes: 2 %
Lymphocytes Relative: 23 %
Lymphs Abs: 0.4 10*3/uL — ABNORMAL LOW (ref 0.7–4.0)
MCH: 30.2 pg (ref 26.0–34.0)
MCHC: 32.2 g/dL (ref 30.0–36.0)
MCV: 93.6 fL (ref 80.0–100.0)
Monocytes Absolute: 0.3 10*3/uL (ref 0.1–1.0)
Monocytes Relative: 18 %
Neutro Abs: 0.8 10*3/uL — ABNORMAL LOW (ref 1.7–7.7)
Neutrophils Relative %: 55 %
Platelets: 72 10*3/uL — ABNORMAL LOW (ref 150–400)
RBC: 2.95 MIL/uL — ABNORMAL LOW (ref 3.87–5.11)
RDW: 14.9 % (ref 11.5–15.5)
WBC: 1.5 10*3/uL — ABNORMAL LOW (ref 4.0–10.5)
nRBC: 0 % (ref 0.0–0.2)

## 2019-03-16 LAB — COMPREHENSIVE METABOLIC PANEL
ALT: 12 U/L (ref 0–44)
AST: 17 U/L (ref 15–41)
Albumin: 1.8 g/dL — ABNORMAL LOW (ref 3.5–5.0)
Alkaline Phosphatase: 62 U/L (ref 38–126)
Anion gap: 11 (ref 5–15)
BUN: 41 mg/dL — ABNORMAL HIGH (ref 8–23)
CO2: 21 mmol/L — ABNORMAL LOW (ref 22–32)
Calcium: 9.3 mg/dL (ref 8.9–10.3)
Chloride: 112 mmol/L — ABNORMAL HIGH (ref 98–111)
Creatinine, Ser: 0.98 mg/dL (ref 0.44–1.00)
GFR calc Af Amer: >60 mL/min (ref >60)
GFR calc non Af Amer: 55 mL/min — ABNORMAL LOW (ref >60)
Glucose, Bld: 148 mg/dL — ABNORMAL HIGH (ref 70–99)
Potassium: 4 mmol/L (ref 3.5–5.1)
Sodium: 144 mmol/L (ref 135–145)
Total Bilirubin: 0.5 mg/dL (ref 0.3–1.2)
Total Protein: 4.9 g/dL — ABNORMAL LOW (ref 6.5–8.1)

## 2019-03-16 LAB — URINE CULTURE
Culture: NO GROWTH
Special Requests: NORMAL

## 2019-03-16 LAB — GLUCOSE, CAPILLARY
Glucose-Capillary: 164 mg/dL — ABNORMAL HIGH (ref 70–99)
Glucose-Capillary: 111 mg/dL — ABNORMAL HIGH (ref 70–99)
Glucose-Capillary: 123 mg/dL — ABNORMAL HIGH (ref 70–99)
Glucose-Capillary: 132 mg/dL — ABNORMAL HIGH (ref 70–99)

## 2019-03-16 LAB — CBC
HCT: 27.9 % — ABNORMAL LOW (ref 36.0–46.0)
Hemoglobin: 9.2 g/dL — ABNORMAL LOW (ref 12.0–15.0)
MCH: 30.1 pg (ref 26.0–34.0)
MCHC: 33 g/dL (ref 30.0–36.0)
MCV: 91.2 fL (ref 80.0–100.0)
Platelets: 75 10*3/uL — ABNORMAL LOW (ref 150–400)
RBC: 3.06 MIL/uL — ABNORMAL LOW (ref 3.87–5.11)
RDW: 14.9 % (ref 11.5–15.5)
WBC: 1.7 10*3/uL — ABNORMAL LOW (ref 4.0–10.5)
nRBC: 0 % (ref 0.0–0.2)

## 2019-03-16 MED ORDER — POLYVINYL ALCOHOL 1.4 % OP SOLN
1.0000 [drp] | Freq: Four times a day (QID) | OPHTHALMIC | Status: DC | PRN
  Filled 2019-03-16: qty 15

## 2019-03-16 MED ORDER — ACETAMINOPHEN 650 MG RE SUPP: 650.0000 mg | Freq: Four times a day (QID) | RECTAL | Status: DC | PRN

## 2019-03-16 MED ORDER — OXYCODONE-ACETAMINOPHEN 5-325 MG PO TABS
1.0000 | ORAL_TABLET | Freq: Four times a day (QID) | ORAL | Status: DC | PRN
  Administered 2019-03-16 – 2019-03-18 (×3): 1 via ORAL
  Filled 2019-03-16 (×3): qty 1

## 2019-03-16 MED ORDER — INSULIN ASPART 100 UNIT/ML ~~LOC~~ SOLN: 0.0000 [IU] | Freq: Every day | SUBCUTANEOUS | Status: DC

## 2019-03-16 MED ORDER — INSULIN ASPART 100 UNIT/ML ~~LOC~~ SOLN
0.0000 [IU] | Freq: Three times a day (TID) | SUBCUTANEOUS | Status: DC
  Administered 2019-03-17 – 2019-03-18 (×4): 3 [IU] via SUBCUTANEOUS

## 2019-03-16 MED ORDER — FENTANYL CITRATE (PF) 100 MCG/2ML IJ SOLN
12.5000 ug | Freq: Once | INTRAMUSCULAR | Status: AC
  Administered 2019-03-16: 12.5 ug via INTRAVENOUS
  Filled 2019-03-16: qty 2

## 2019-03-16 MED ORDER — VITAMIN D 25 MCG (1000 UNIT) PO TABS
2000.0000 [IU] | ORAL_TABLET | Freq: Every day | ORAL | Status: DC
  Administered 2019-03-16 – 2019-03-19 (×3): 2000 [IU] via ORAL
  Filled 2019-03-16 (×3): qty 2

## 2019-03-16 MED ORDER — PROPYLENE GLYCOL 0.6 % OP SOLN: Freq: Every day | OPHTHALMIC | Status: DC | PRN

## 2019-03-16 MED ORDER — ACETAMINOPHEN 325 MG PO TABS
650.0000 mg | ORAL_TABLET | Freq: Four times a day (QID) | ORAL | Status: DC | PRN
  Administered 2019-03-16: 650 mg via ORAL
  Filled 2019-03-16: qty 2

## 2019-03-16 MED ORDER — ONDANSETRON HCL 4 MG/2ML IJ SOLN: 4.0000 mg | Freq: Four times a day (QID) | INTRAMUSCULAR | Status: DC | PRN

## 2019-03-16 MED ORDER — TRAZODONE HCL 100 MG PO TABS
100.0000 mg | ORAL_TABLET | Freq: Every day | ORAL | Status: DC
  Administered 2019-03-16 – 2019-03-18 (×3): 100 mg via ORAL
  Filled 2019-03-16 (×3): qty 1

## 2019-03-16 MED ORDER — SODIUM CHLORIDE 0.9 % IV SOLN
2.0000 g | Freq: Two times a day (BID) | INTRAVENOUS | Status: DC
  Administered 2019-03-16 – 2019-03-20 (×9): 2 g via INTRAVENOUS
  Filled 2019-03-16 (×11): qty 2

## 2019-03-16 MED ORDER — INSULIN GLARGINE 100 UNIT/ML ~~LOC~~ SOLN
10.0000 [IU] | Freq: Every day | SUBCUTANEOUS | Status: DC
  Administered 2019-03-16 – 2019-03-18 (×3): 10 [IU] via SUBCUTANEOUS
  Filled 2019-03-16 (×4): qty 0.1

## 2019-03-16 MED ORDER — VANCOMYCIN HCL 1750 MG/350ML IV SOLN
1750.0000 mg | INTRAVENOUS | Status: DC
  Filled 2019-03-16: qty 350

## 2019-03-16 MED ORDER — PANTOPRAZOLE SODIUM 40 MG PO TBEC
40.0000 mg | DELAYED_RELEASE_TABLET | Freq: Every day | ORAL | Status: DC
  Administered 2019-03-16 – 2019-03-19 (×3): 40 mg via ORAL
  Filled 2019-03-16 (×3): qty 1

## 2019-03-16 MED ORDER — LOSARTAN POTASSIUM 50 MG PO TABS
100.0000 mg | ORAL_TABLET | Freq: Every day | ORAL | Status: DC
  Administered 2019-03-18 – 2019-03-19 (×2): 100 mg via ORAL
  Filled 2019-03-16 (×3): qty 2

## 2019-03-16 MED ORDER — METHOCARBAMOL 500 MG PO TABS
750.0000 mg | ORAL_TABLET | Freq: Two times a day (BID) | ORAL | Status: DC | PRN
  Administered 2019-03-16 – 2019-03-18 (×2): 750 mg via ORAL
  Filled 2019-03-16 (×2): qty 2

## 2019-03-16 MED ORDER — CLONIDINE HCL 0.2 MG PO TABS
0.2000 mg | ORAL_TABLET | Freq: Every day | ORAL | Status: DC
  Administered 2019-03-16 – 2019-03-18 (×3): 0.2 mg via ORAL
  Filled 2019-03-16 (×3): qty 1

## 2019-03-16 MED ORDER — OXYCODONE-ACETAMINOPHEN 5-325 MG PO TABS
1.0000 | ORAL_TABLET | Freq: Four times a day (QID) | ORAL | Status: AC | PRN
  Administered 2019-03-16: 1 via ORAL
  Filled 2019-03-16: qty 1

## 2019-03-16 MED ORDER — NIFEDIPINE ER OSMOTIC RELEASE 30 MG PO TB24
30.0000 mg | ORAL_TABLET | Freq: Every day | ORAL | Status: DC
  Administered 2019-03-18 – 2019-03-19 (×2): 30 mg via ORAL
  Filled 2019-03-16 (×3): qty 1

## 2019-03-16 MED ORDER — KETOTIFEN FUMARATE 0.025 % OP SOLN
1.0000 [drp] | Freq: Every day | OPHTHALMIC | Status: DC | PRN
  Filled 2019-03-16: qty 5

## 2019-03-16 MED ORDER — OMEGA-3-ACID ETHYL ESTERS 1 G PO CAPS
1.0000 | ORAL_CAPSULE | Freq: Every day | ORAL | Status: DC
  Administered 2019-03-16 – 2019-03-19 (×3): 1 g via ORAL
  Filled 2019-03-16 (×3): qty 1

## 2019-03-16 MED ORDER — CLOBETASOL PROPIONATE 0.05 % EX CREA
1.0000 "application " | TOPICAL_CREAM | Freq: Every day | CUTANEOUS | Status: DC | PRN
  Filled 2019-03-16: qty 15

## 2019-03-16 MED ORDER — ALLOPURINOL 100 MG PO TABS: 100.0000 mg | ORAL_TABLET | Freq: Every day | ORAL | Status: DC | PRN

## 2019-03-16 MED ORDER — COLLAGENASE 250 UNIT/GM EX OINT
1.0000 "application " | TOPICAL_OINTMENT | Freq: Every day | CUTANEOUS | Status: DC
  Administered 2019-03-16 – 2019-03-22 (×6): 1 via TOPICAL
  Filled 2019-03-16: qty 30

## 2019-03-16 MED ORDER — ASPIRIN EC 81 MG PO TBEC
81.0000 mg | DELAYED_RELEASE_TABLET | Freq: Every day | ORAL | Status: DC
  Administered 2019-03-18 – 2019-03-19 (×2): 81 mg via ORAL
  Filled 2019-03-16 (×3): qty 1

## 2019-03-16 MED ORDER — PREGABALIN 25 MG PO CAPS
25.0000 mg | ORAL_CAPSULE | Freq: Three times a day (TID) | ORAL | Status: DC | PRN
  Administered 2019-03-16 – 2019-03-17 (×2): 25 mg via ORAL
  Filled 2019-03-16 (×2): qty 1

## 2019-03-16 MED ORDER — TRIAMCINOLONE ACETONIDE 0.5 % EX CREA
1.0000 "application " | TOPICAL_CREAM | Freq: Every day | CUTANEOUS | Status: DC | PRN
  Filled 2019-03-16: qty 15

## 2019-03-16 MED ORDER — CHLORHEXIDINE GLUCONATE CLOTH 2 % EX PADS
6.0000 | MEDICATED_PAD | Freq: Every day | CUTANEOUS | Status: DC
  Administered 2019-03-16 – 2019-03-19 (×3): 6 via TOPICAL

## 2019-03-16 MED ORDER — SODIUM CHLORIDE 0.9 % IV SOLN: INTRAVENOUS | Status: DC

## 2019-03-16 MED ORDER — HYDRALAZINE HCL 25 MG PO TABS
25.0000 mg | ORAL_TABLET | Freq: Every day | ORAL | Status: DC
  Administered 2019-03-16 – 2019-03-18 (×3): 25 mg via ORAL
  Filled 2019-03-16 (×3): qty 1

## 2019-03-16 MED ORDER — LEVOTHYROXINE SODIUM 50 MCG PO TABS
50.0000 ug | ORAL_TABLET | Freq: Every day | ORAL | Status: DC
  Administered 2019-03-17 – 2019-03-19 (×3): 50 ug via ORAL
  Filled 2019-03-16 (×3): qty 1

## 2019-03-16 MED ORDER — SIMVASTATIN 20 MG PO TABS
10.0000 mg | ORAL_TABLET | ORAL | Status: DC
  Administered 2019-03-16 – 2019-03-18 (×2): 10 mg via ORAL
  Filled 2019-03-16 (×3): qty 1

## 2019-03-16 MED ORDER — ENOXAPARIN SODIUM 40 MG/0.4ML ~~LOC~~ SOLN
40.0000 mg | Freq: Every day | SUBCUTANEOUS | Status: DC
  Administered 2019-03-16 – 2019-03-19 (×3): 40 mg via SUBCUTANEOUS
  Filled 2019-03-16 (×3): qty 0.4

## 2019-03-16 MED ORDER — CALCIUM CITRATE 950 (200 CA) MG PO TABS
1.0000 | ORAL_TABLET | Freq: Every day | ORAL | Status: DC
  Administered 2019-03-16 – 2019-03-19 (×3): 200 mg via ORAL
  Filled 2019-03-16 (×4): qty 1

## 2019-03-16 MED ORDER — ONDANSETRON HCL 4 MG PO TABS: 4.0000 mg | ORAL_TABLET | Freq: Four times a day (QID) | ORAL | Status: DC | PRN

## 2019-03-16 NOTE — Discharge Summary (Signed)
Physician Discharge Summary  Patient ID: Debra Hopkins MRN: [id number] DOB/AGE: 1941-02-05 79 y.o.  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 EXAM: CT HEAD WITHOUT CONTRAST TECHNIQUE: Contiguous axial images were obtained from the base of the skull through the vertex without intravenous contrast. COMPARISON:  Head CT 01/28/2019. FINDINGS: Brain: Mild cerebral atrophy. Patchy and confluent areas of decreased attenuation are noted throughout the deep and periventricular white matter of the cerebral hemispheres bilaterally, compatible with chronic microvascular ischemic disease. No evidence of acute infarction, hemorrhage, hydrocephalus, extra-axial collection or mass lesion/mass effect. Vascular: No hyperdense vessel or unexpected calcification. Skull: Normal. Negative for fracture or focal lesion. Sinuses/Orbits: No acute finding. Other: None. IMPRESSION: 1. No acute intracranial abnormalities. 2. Mild cerebral atrophy with chronic microvascular ischemic changes in the cerebral white matter, as above. Electronically Signed   By: Vinnie Langton M.D.   On: 02/18/2019 14:25   CT ABDOMEN PELVIS W CONTRAST  Result Date: 03/15/2019 CLINICAL DATA:  79 year old female status post right above knee amputation and muscle flap to the right groin incision. Concern for  cellulitis/soft tissue infection. EXAM: CT ABDOMEN AND PELVIS WITH CONTRAST TECHNIQUE: Multidetector CT imaging of the abdomen and pelvis was performed using the standard protocol following bolus administration of intravenous contrast. CONTRAST:  185mL OMNIPAQUE IOHEXOL 300 MG/ML  SOLN COMPARISON:  CT of the abdomen pelvis dated 09/05/2018. FINDINGS: Lower chest: Partially visualized small bilateral pleural effusions with associated partial compressive atelectasis of the lung bases. Borderline cardiomegaly. Coronary vascular calcifications noted. No intra-abdominal free air or free fluid. Hepatobiliary: The liver is unremarkable. No intrahepatic biliary ductal dilatation. Minimal layering gallstones. No pericholecystic fluid or evidence of acute cholecystitis by CT. Pancreas: Unremarkable. No pancreatic ductal dilatation or surrounding inflammatory changes. Spleen: Top-normal spleen measuring up to 13 cm in length. Adrenals/Urinary Tract: The adrenal glands are unremarkable. There is no hydronephrosis on either side. Subcentimeter left renal inferior pole hypodense focus is too small to characterize. There is symmetric enhancement and excretion of contrast by both kidneys. The visualized ureters and urinary bladder appear unremarkable. Stomach/Bowel: There is moderate stool throughout the colon. There is no bowel obstruction or active inflammation. There is sigmoid diverticulosis without active inflammatory changes. Appendectomy. Vascular/Lymphatic: Advanced aortoiliac atherosclerotic disease. The IVC is unremarkable. No portal venous gas. There is no adenopathy. Severe atherosclerotic calcification of the superficial femoral arteries bilaterally most severely involving the right lower extremity. There is associated high-grade luminal narrowing and non opacification of the SFA's bilaterally. Reproductive: Hysterectomy. No adnexal masses. Other: There is postsurgical changes of right above knee amputation. There is  open wound in the right groin and small pockets of air in the superficial soft tissues of the right groin. There is diffuse edema of the subcutaneous soft tissues of the right hip and right lower extremity which may represent cellulitis. Clinical correlation is recommended. No drainable fluid collection or abscess. Musculoskeletal: Above knee amputation of  the right lower extremity. The edge of the right femoral stump appears sharp and smooth. There is some callus formation adjacent to the distal end of the right femoral stump. No periosteal elevation or bone erosion. There is multilevel degenerative changes of the spine. No acute fracture. IMPRESSION: 1. Postsurgical changes of right above knee amputation. There is an open wound in the right groin and small pockets of air in the superficial soft tissues of the right groin, likely postsurgical. No drainable fluid collection or abscess. 2. Diffuse edema of the subcutaneous soft tissues of the right hip and right lower extremity may represent cellulitis. Clinical correlation is recommended. 3. No bowel obstruction or active inflammation. Normal appendix. 4. Sigmoid diverticulosis. 5. Small bilateral pleural effusions with partial compressive atelectasis of the lung bases. 6. Aortic Atherosclerosis (ICD10-I70.0). Electronically Signed   By: Anner Crete M.D.   On: 03/15/2019 22:56   CT FEMUR RIGHT W CONTRAST  Result Date: 03/15/2019 CLINICAL DATA:  79 year old female status post right above knee amputation and muscle flap to the right groin incision. Concern for cellulitis/soft tissue infection. EXAM: CT ABDOMEN AND PELVIS WITH CONTRAST TECHNIQUE: Multidetector CT imaging of the abdomen and pelvis was performed using the standard protocol following bolus administration of intravenous contrast. CONTRAST:  145mL OMNIPAQUE IOHEXOL 300 MG/ML  SOLN COMPARISON:  CT of the abdomen pelvis dated 09/05/2018. FINDINGS: Lower chest: Partially visualized small bilateral  pleural effusions with associated partial compressive atelectasis of the lung bases. Borderline cardiomegaly. Coronary vascular calcifications noted. No intra-abdominal free air or free fluid. Hepatobiliary: The liver is unremarkable. No intrahepatic biliary ductal dilatation. Minimal layering gallstones. No pericholecystic fluid or evidence of acute cholecystitis by CT. Pancreas: Unremarkable. No pancreatic ductal dilatation or surrounding inflammatory changes. Spleen: Top-normal spleen measuring up to 13 cm in length. Adrenals/Urinary Tract: The adrenal glands are unremarkable. There is no hydronephrosis on either side. Subcentimeter left renal inferior pole hypodense focus is too small to characterize. There is symmetric enhancement and excretion of contrast by both kidneys. The visualized ureters and urinary bladder appear unremarkable. Stomach/Bowel: There is moderate stool throughout the colon. There is no bowel obstruction or active inflammation. There is sigmoid diverticulosis without active inflammatory changes. Appendectomy. Vascular/Lymphatic: Advanced aortoiliac atherosclerotic disease. The IVC is unremarkable. No portal venous gas. There is no adenopathy. Severe atherosclerotic calcification of the superficial femoral arteries bilaterally most severely involving the right lower extremity. There is associated high-grade luminal narrowing and non opacification of the SFA's bilaterally. Reproductive: Hysterectomy. No adnexal masses. Other: There is postsurgical changes of right above knee amputation. There is open wound in the right groin and small pockets of air in the superficial soft tissues of the right groin. There is diffuse edema of the subcutaneous soft tissues of the right hip and right lower extremity which may represent cellulitis. Clinical correlation is recommended. No drainable fluid collection or abscess. Musculoskeletal: Above knee amputation of the right lower extremity. The edge of the  right femoral stump appears sharp and smooth. There is some callus formation adjacent to the distal end of the right femoral stump. No periosteal elevation or bone erosion. There is multilevel degenerative changes of the spine. No acute fracture. IMPRESSION: 1. Postsurgical changes of right above knee amputation. There is an open wound in the right groin and small pockets of air in the superficial soft tissues of the right groin, likely postsurgical. No drainable fluid collection or abscess. 2. Diffuse edema of the subcutaneous soft tissues of the right hip and right  lower extremity may represent cellulitis. Clinical correlation is recommended. 3. No bowel obstruction or active inflammation. Normal appendix. 4. Sigmoid diverticulosis. 5. Small bilateral pleural effusions with partial compressive atelectasis of the lung bases. 6. Aortic Atherosclerosis (ICD10-I70.0). Electronically Signed   By: Anner Crete M.D.   On: 03/15/2019 22:56   PERIPHERAL VASCULAR CATHETERIZATION  Result Date: 02/15/2019 Patient name: Debra Hopkins MRN: [id number] DOB: May 02, 1940 Sex: female 02/15/2019 Pre-operative Diagnosis: Critical right lower extremity ischemia with toe ulceration Post-operative diagnosis:  Same Surgeon:  Erlene Quan C. [name], MD Procedure Performed: 1.  Ultrasound-guided cannulation left common femoral artery 2.  Aortogram with bilateral lower extremity runoff 3.  Stent of right external iliac artery with 7 x 37 millimeters express balloon expandable 4.  Ultrasound-guided cannulation right posterior tibial artery 5.  Stent of right popliteal artery with 5 x 150 Viabahn 6.  Stent of right SFA with 6 x 120, 6 x 120, 6 x 147mm Elluvia 7.  Attempted Mynx device closure left common femoral artery Indications: 79 year old female with history of intervention on the left SFA.  She now has toe ulceration on the right which has been acutely getting worse.  This causes her significant pain.  She has monophasic signals at the ankle  ABI that is noncompressible.  She is indicated for aortogram possible intervention on the right. Findings: Aorta and iliac segments were heavily calcified.  The right external leg artery was subtotally occluded.  After stenting with 7 mm balloon there was no flow-limiting stenosis or dissection.  On the left side she has an SFA occlusion appears to reconstitute popliteal has runoff via posterior tibial and peroneal arteries.  On the right side which is the site of interest the SFA occludes after 2 or 3 cm reconstitutes above-knee popliteal and appears to have runoff via the posterior tibial only.  We could not get through in an antegrade fashion.  We were able to get through in a retrograde fashion and snare just at the distal common femoral artery.  After stenting there is brisk flow to the level of the popliteal below the knee runoff is via the posterior tibial artery.  There is good signal at the posterior tibial at the ankle and brisk capillary refill.  Patient will be high risk for below-knee amputation. Patient was tearful and complaining of generalized pain throughout the procedure.  She did not have any specific complaints but the procedure was quite difficult given her inability to tolerate lying flat and catheter, wire, balloon, stent manipulation.  Procedure:  The patient was identified in the holding area and taken to room 8.  The patient was then placed supine on the table and prepped and draped in the usual sterile fashion.  A time out was called.  Ultrasound was used to evaluate the left common femoral artery.  This was heavily diseased.  The area was anesthetized 1% lidocaine.  This was cannulated micropuncture needle followed by wire and sheath.  And images saved the permanent record.  A Bentson wire was placed followed by 5 [place] sheath.  Omni catheter was placed to level L1 and aortogram was performed.  With the above findings I attempted to cross the bifurcation but could not given the stenosis.   I performed a pelvic angiography which demonstrated likely occluded external leg artery.  This time I placed a long 7 French sheath into the right common iliac artery.  I used the Glidewire advantage to cross and confirmed intraluminal access with quick cross catheter.  I then primarily stented the right external iliac artery with 7 mm balloon expandable stent.  I then replaced the dilator of the sheath and placed it down into the common femoral artery.  I screw cross catheter and Glidewire advantage to attempt to cross from above.  I was able to get all the way to the knee but was in a dissection plane could not return.  Also attempted to use V 18 wire but could not all the way to the level of popliteal.  With this I used ultrasound to cannulate the posterior tibial artery with micropuncture needle, wire and sheath.  I then placed a CXI catheter followed by the team.  I attempted to snare this behind the knee and up in the SFA but could not.  I elected to go all the way retrograde with a long CXI V 18.  I was ultimately able to snare it is just in the distal common femoral artery.  I pulled through and through access.  I balloon dilated the whole popliteal SFA segment with 4 mm balloon.  I then elected to stent with drug-eluting stents starting distal at the adductor.  This was a 6 x 120 followed by 6 x 120 followed by 6 x 170mm proximally.  These were all postdilated with 5 mm balloon.  Completion demonstrated still flow-limiting stenosis distally.  There did appear to reconstitute popliteal at the level of the knee.  I then placed a Viabahn covered stent distally.  This was deployed.  I then postdilated this with a 4 mm balloon.  Completion demonstrated flow all the way to the posterior tibial artery.  There did not appear to be any other tibials past the mid calf.  Flow was impeded by the sheath in the posterior tibial artery.  This was removed and pressure held.  I then pulled the sheath back over the wire in  the left common femoral artery and perform retrograde angiography.  I exchanged over a Bentson wire for a short 7 [place] sheath.  I attempted to use a minx device but the balloon popped on calcification.  Pressure was held.  Other than her discomfort throughout the case she tolerated it okay.  At the completion she had a good signal of the posterior tibial all the way to the ankle brisk capillary refill. Contrast: 215 cc Brandon C. [name], MD [facility] Office: [phone] Pager: [phone]  DG Ang/Ext/Uni/Or Right  Result Date: 02/17/2019 CLINICAL DATA:  Right lower extremity arteriogram EXAM: RIGHT ANG/EXT/UNI/ OR CONTRAST:  Not provided FLUOROSCOPY TIME:  45 seconds COMPARISON:  None. FINDINGS: Two intraoperative right lower extremity angiograms are provided for review Initial arteriogram demonstrates the distal end of a below knee bypass graft. While the distal end of the bypass graft anastomosis appear patent, there appears to be a small amount of contrast extravasation about the distal anastomotic site. Subsequent arteriogram centered at the level of the lower leg and hindfoot demonstrates a 2 vessel runoff to the right lower leg and foot via the posterior tibial and likely the peroneal artery. The peroneal artery appears to reconstitute the dorsalis pedis artery at the level of the ankle mortise. The dorsalis pedis artery appears patent to the level of the imaged midfoot. There is short-segment occlusion involving the distal aspect of the posterior tibial artery with early staggered reconstitution. No discrete internal filling defect suggest distal embolism. IMPRESSION: Intraoperative arteriogram as detailed above. Electronically Signed   By: Eldridge Abrahams.D.  On: 02/17/2019 07:35   VAS [place] GROIN PSEUDOANEURYSM  Result Date: 02/16/2019  ARTERIAL PSEUDOANEURYSM  Exam: Left groin Indications: Patient complains of groin pain and bruising. History: S/p catheterization.  Performing Technologist: June Leap RDMS, RVT  Examination Guidelines: A complete evaluation includes B-mode imaging, spectral Doppler, color Doppler, and power Doppler as needed of all accessible portions of each vessel. Bilateral testing is considered an integral part of a complete examination. Limited examinations for reoccurring indications may be performed as noted. +-----------+----------+--------+------+----------+ Left DuplexPSV (cm/s)WaveformPlaqueComment(s) +-----------+----------+--------+------+----------+ CFA            96                             +-----------+----------+--------+------+----------+ Left Vein comments: patent CFV  Findings: An area with well defined borders measuring 4.3 cm x 3.0 cm was visualized arising off of the left CFA with ultrasound characteristics of a pseudoaneurysm. The neck measures approximately 0.8 cm wide and 1.7 cm long.  Diagnosing physician: Harold Barban MD Electronically signed by Harold Barban MD on 02/16/2019 at 5:25:51 PM.   --------------------------------------------------------------------------------    Final    VAS [place] LOWER EXTREMITY ARTERIAL DUPLEX  Result Date: 02/16/2019 LOWER EXTREMITY ARTERIAL DUPLEX STUDY Indications: Rest pain.  Vascular Interventions: 02/15/19 Stent of right external iliac artery, right SFA,                         and right popliteal artery. Current ABI:            02/11/19 noncompressible Performing Technologist: June Leap RDMS, RVT  Examination Guidelines: A complete evaluation includes B-mode imaging, spectral Doppler, color Doppler, and power Doppler as needed of all accessible portions of each vessel. Bilateral testing is considered an integral part of a complete examination. Limited examinations for reoccurring indications may be performed as noted.  +-----------+--------+-----+---------------+----------+--------+ RIGHT      PSV cm/sRatioStenosis       Waveform  Comments  +-----------+--------+-----+---------------+----------+--------+ CFA Prox   302          50-74% stenosis                   +-----------+--------+-----+---------------+----------+--------+ DFA        138                         monophasic         +-----------+--------+-----+---------------+----------+--------+ SFA Prox   24           occluded       monophasic         +-----------+--------+-----+---------------+----------+--------+ SFA Mid                 occluded                          +-----------+--------+-----+---------------+----------+--------+ SFA Distal              occluded                          +-----------+--------+-----+---------------+----------+--------+ POP Prox                occluded                          +-----------+--------+-----+---------------+----------+--------+ POP Mid  occluded                          +-----------+--------+-----+---------------+----------+--------+ POP Distal              occluded                          +-----------+--------+-----+---------------+----------+--------+ ATA Distal              occluded                          +-----------+--------+-----+---------------+----------+--------+ PTA Distal              occluded                          +-----------+--------+-----+---------------+----------+--------+ PERO Distal             occluded                          +-----------+--------+-----+---------------+----------+--------+  Summary: Right: 50-74% stenosis noted in the common femoral artery. Total occlusion noted in the superficial femoral artery. Total occlusion noted in the popliteal artery. Total occlusion noted in the anterior tibial artery. Total occlusion noted in the posterior  tibial artery. Total occlusion noted in the peroneal artery.  See table(s) above for measurements and observations. Electronically signed by Harold Barban MD on 02/16/2019 at 5:26:10 PM.     Final    VAS [place] LOWER EXTREMITY VENOUS (DVT)  Result Date: 03/03/2019  Lower Venous Study Indications: Edema.  Limitations: Pain pain tolerance. Comparison Study: no prior Performing Technologist: Abram Sander RVS  Examination Guidelines: A complete evaluation includes B-mode imaging, spectral Doppler, color Doppler, and power Doppler as needed of all accessible portions of each vessel. Bilateral testing is considered an integral part of a complete examination. Limited examinations for reoccurring indications may be performed as noted.  +---------+---------------+---------+-----------+----------+-------------------+ LEFT     CompressibilityPhasicitySpontaneityPropertiesThrombus Aging      +---------+---------------+---------+-----------+----------+-------------------+ CFV      Full           Yes      Yes                                      +---------+---------------+---------+-----------+----------+-------------------+ SFJ      Full                                                             +---------+---------------+---------+-----------+----------+-------------------+ FV Prox  Full                                                             +---------+---------------+---------+-----------+----------+-------------------+ FV Mid   Full                                                             +---------+---------------+---------+-----------+----------+-------------------+  FV DistalFull                                                             +---------+---------------+---------+-----------+----------+-------------------+ PFV      Full                                                             +---------+---------------+---------+-----------+----------+-------------------+ POP                     Yes      Yes                  unable to tolerate                                                        compression          +---------+---------------+---------+-----------+----------+-------------------+ PTV                                                   unable to tolerate                                                        compression         +---------+---------------+---------+-----------+----------+-------------------+ PERO                                                  Not visualized      +---------+---------------+---------+-----------+----------+-------------------+     Summary: Left: There is no evidence of deep vein thrombosis in the lower extremity. However, portions of this examination were limited- see technologist comments above. No cystic structure found in the popliteal fossa.  *See table(s) above for measurements and observations. Electronically signed by Curt Jews MD on 03/03/2019 at 4:06:25 PM.    Final     Labs:  Basic Metabolic Panel: Recent Labs  Lab 03/10/19 0453 03/12/19 0357 03/13/19 0439 03/14/19 0327 03/15/19 0544  NA 138 137 143 142 144  K 4.0 4.0 4.6 4.3 4.0  CL 102 101 107 109 110  CO2 26 26 26 24 25   GLUCOSE 199* 198* 182* 198* 170*  BUN 48* 75* 69* 66* 53*  CREATININE 1.54* 1.75* 1.32* 1.14* 1.06*  CALCIUM 9.0 8.7* 8.8* 8.9 8.9    CBC: Recent Labs  Lab 03/14/19 0327 03/15/19 0544 03/15/19 2300  WBC 1.6* 1.8* 1.5*  NEUTROABS 0.8* 1.3* 0.8*  HGB 6.9* 8.8* 8.9*  HCT 21.6* 26.5* 27.6*  MCV 96.4 92.0 93.6  PLT 79* 74* 72*    CBG: Recent Labs  Lab 03/15/19 0600 03/15/19 1151 03/15/19 1637 03/15/19 2307 03/16/19 0059  GLUCAP 138* 140* 162* 192* 164*   Family history.  Mother with CVA hypertension.  Father with CVA hypertension diabetes mellitus CAD.  Sister with diabetes mellitus hypertension.  Brother with lung cancer.  Denies any rectal cancer  Brief HPI:   Debra Hopkins is a 79 y.o. right-handed female with history of CAD, diabetes mellitus, TIA, left CEA 2008, fibromyalgia, macular degeneration, peripheral vascular disease with  multiple revascularization procedures and maintained on low-dose aspirin.  Presented 02/15/2019 with chronic right lower extremity ischemic changes and toe ulceration.  Recent aortogram showed aortic and iliac segments heavily calcified.  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 [name] on 02/15/2019.  Noted postoperatively mottling of the right foot started on heparin drip.  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  Hospital course complicated by pain due to profound ischemic changes of her foot which felt to be nonviable and underwent right AKA 02/22/2019 per Dr. Donnetta Hutching.  Hospital course complicated by anemia transfused 2 units packed red blood cells.  Subcutaneous heparin was initiated for DVT prophylaxis 02/23/2019 as well as remaining on low-dose aspirin.  Vancomycin resumed 03/01/2019 for wound coverage.  Therapy evaluations completed and patient was admitted for a comprehensive rehab program.   Hospital Course: Debra Hopkins was admitted to rehab 03/15/2019 for inpatient therapies to consist of PT, ST and OT at least three hours five days a week. Past admission physiatrist, therapy team and rehab RN have worked together to provide customized collaborative inpatient rehab.  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  DVT prophylaxis with subcutaneous heparin venous Doppler studies negative.  Close monitoring of thrombocytopenia 74,000.  Patient also remain on low-dose aspirin.  Pain managed with use of Lyrica as needed oxycodone as needed addition of Lidoderm patches.  Her oxycodone was decreased to 5 mg every 4 hours as needed due to sedation.  Wound coverage close follow-up per vascular surgery right  groin with fat necrosis serous drainage considering plan for groin debridement possible wound VAC placement she went to the operating room on 1229 wound VAC placed followed by vascular surgery.  Acute blood loss anemia hemoglobin did dip to 6.9 plan for transfusion.  Her blood pressures were monitored closely while on clonidine Cozaar Demadex and Procardia her hydralazine was adjusted.  Blood sugars with Lantus insulin hemoglobin A1c 5.9.  On the evening of 03/14/2018 noted increasing erythema of the right hip proximal thigh and vascular surgery follow-up antibiotics were broadened covering of MRSA.  New cultures were obtained of the right groin as well as orders for noncontrast CT of right hemipelvis hip and right residual limb.  Rapid response contacted on the evening of 03/14/2018 for fever of 102.1 rectal blood cultures remain pending and follow-up hemoglobin 8.9 hematocrit 27.6 WBC 1.5 platelets 72,000.  In regards to spike in fever and wound coverage [facility] team was consulted for transfer to acute care services 03/15/2018 for ongoing monitoring and evaluation of wound.   Blood pressures were monitored on TID basis and remained soft and monitored  Diabetes has been monitored with ac/hs CBG checks and SSI was use prn for  tighter BS control.   She has made gains slow progress during rehab stay   /She will continue to receive follow up therapies   after discharge  Rehab course: During patient's stay in rehab weekly team conferences were held to monitor patient's progress, set goals and discuss barriers to discharge. At admission, patient required max assist to total assist squat pivot transfers minimal assist ambulate 3 feet rolling walker.  Minimal assist for eating.  Moderate assist upper body bathing max is lower body bathing mod assist upper body dressing max assist lower body dressing  She  has had improvement in activity tolerance, balance, postural control as well as ability to compensate  for deficits. She has had improvement in functional use RUE/LUE  and RLE/LLE as well as improvement in awareness.  Patient needing some encouragement to participate.  Perform supine to sit from sit with mod max assist with use of bed rails.  Provided verbal cues for sliding lower extremities off the bed and pushing through elbows to sit up.  Perform rolling right to left with minimal assistance.  Completed trunk mobility and forward sit and to long sit from elevated head of bed position using bed rails for support.  Completed rolling in each direction with repetition and increased time moderate assist level.       Disposition: Discharged to acute care services There are no questions and answers to display.         Diet: Carb modified diet  Special Instructions: Wound care as directed per vascular surgery  Medications at discharge.  As per [facility] team      Signed: Lavon Paganini [place] 03/16/2019, 5:25 AM

## 2019-03-16 NOTE — Progress Notes (Signed)
Same day note  Patient seen and examined at bedside.  Patient was admitted to the hospital for wound infection, sepsis  At the time of my evaluation, patient complains of Feeling okay.  Denies overt pain fever chills.  Physical examination reveals thinly built female status post right BKA.  Right groin with oozing purulent discharge and dressing.  Laboratory data and imaging was reviewed for the last 24 hours  Assessment and Plan. Sepsis secondary to right groin wound infection.  Continue IV antibiotics with vancomycin and cefepime.  Vascular surgery to follow.  Blood cultures negative so far.  Wound culture with gram variable rods and gram-positive cocci.  History of coronary artery disease.  No acute issues.    History of GERD- continue PPI  Hypothyroidism -continue Synthroid  Essential hypertension.  Will resume clonidine, hydralazine, losartan.  Monitor renal function closely.  Peripheral vascular disease status post right BKA.  Vascular surgery on board.  Pancytopenia, neutropenia.  Will closely monitor.  Continue broad-spectrum antibiotic at this time.  No Evidence of bleeding.  We will resume home medication.  Hold torsemide for now.  No Charge  Signed,  Delila Pereyra, MD [facility]

## 2019-03-16 NOTE — Progress Notes (Signed)
RN doing bedside report with incoming shift and noticed pt had deaccessed her own R chest port.  IV team consulted to access port.

## 2019-03-16 NOTE — Plan of Care (Signed)
  Problem: Clinical Measurements: Goal: Will remain free from infection Outcome: Progressing Goal: Respiratory complications will improve Outcome: Progressing   Problem: Nutrition: Goal: Adequate nutrition will be maintained Outcome: Progressing   Problem: Education: Goal: Knowledge of General Education information will improve Description: Including pain rating scale, medication(s)/side effects and non-pharmacologic comfort measures Outcome: Not Progressing   Problem: Coping: Goal: Level of anxiety will decrease Outcome: Not Progressing   Problem: Pain Managment: Goal: General experience of comfort will improve Outcome: Not Progressing

## 2019-03-16 NOTE — Consult Note (Signed)
[facility] Nurse Consult Note: Patient now receiving care in [facility] 2W21.  I saw the patient twice yesterday while in our Inpatient Rehab area.  I have restarted the orders for wound care from yesterday. Reason for Consult: Wound type: Pressure Injury POA: Yes/No/NA Measurement: Wound bed: Drainage (amount, consistency, odor)  Periwound: Dressing procedure/placement/frequency: Apply Santyl to the sacral coccyx wound in a nickel thick layer. Cover with a saline moistened gauze, then dry gauze or ABD pad.  Change daily.  PT to perform after hydrotherapy. Primary RN to do all days PT not performing. Saline moistened gauze into right groin wound, top with ABD pads, change each shift.    PLEASE NOTE:  THE VASCULAR SURGEON IS DIRECTING THE CARE OF THE RIGHT GROIN WOUND, NOT THE WOC TEAM.  Monitor the wound area(s) for worsening of condition such as: Signs/symptoms of infection,  Increase in size,  Development of or worsening of odor, Development of pain, or increased pain at the affected locations.  Notify the medical team if any of these develop.  Thank you for the consult.  [facility] nurse will not follow at this time.  Please re-consult the [facility] team if needed.  Val Riles, RN, MSN, CWOCN, CNS-BC, pager [phone]

## 2019-03-16 NOTE — Progress Notes (Addendum)
Progress Note  VASCULAR SURGERY ASSESSMENT & PLAN:   POD6-RIGHT GROIN DEBRIDEMENT, SARTORIUS FLAP, PLACEMENT OF [id number] a CT of the abdomen and right thigh was ordered because of her cellulitis in the lateral right thigh.  I do not see an abscess.  Based on her physical examination the wound is not healing well and she will likely require debridement in the operating room.  She did not tolerate the VAC as she was developing low-grade fevers and when the [facility] was removed there was a significant odor which is now resolved.  Continue with wet-to-dry dressing changes for now.    I have put the patient on the schedule tomorrow for debridement of the right groin by Dr. [name].  Her platelet count is 75,000 which is stable.  I have written preop orders.  ID: She is now on IV Maxipime and IV vancomycin.  Culture of her right groin wound shows abundant Klebsiella oxytoca susceptibilities are pending.  2 blood cultures have been negative so far.  ANEMIA: Hemoglobin is now 9.2 after transfusion.  NUTRITION: Would consider nutrition consult. Certainly this could be contributing to her healing issues also.  Deitra Mayo, MD Office: [phone]   03/16/2019 9:25 AM   Subjective:  Admitted to acute care this morning from acute rehab.  She is alert and oriented; denies pain    Vitals:   03/16/19 0300 03/16/19 0800  BP: (!) 139/50 138/61  Pulse:  76  Resp:  17  Temp: 98.5 F (36.9 C) 97.8 F (36.6 C)  SpO2:  91%    Physical Exam: Cardiac:  RRR Lungs:  nonlabored Incisions:  Right AKA incision intact without drainage. Right residual limb edema persists. Erythema of right hip/lateral aspect of thigh noted. No induration or fluctuance.  Extremities:  LLE: foot warm without skin breakdown. Brisk biphasic PT, peroneal doppler pulses and detectable at the 1st and 2nd web space.  Groin dressing in place. No odor detected   CLINICAL DATA:  79 year old female status post right above  knee amputation and muscle flap to the right groin incision. Concern for cellulitis/soft tissue infection.  EXAM: CT ABDOMEN AND PELVIS WITH CONTRAST  TECHNIQUE: Multidetector CT imaging of the abdomen and pelvis was performed using the standard protocol following bolus administration of intravenous contrast.  CONTRAST:  167mL OMNIPAQUE IOHEXOL 300 MG/ML  SOLN  COMPARISON:  CT of the abdomen pelvis dated 09/05/2018.  FINDINGS: Lower chest: Partially visualized small bilateral pleural effusions with associated partial compressive atelectasis of the lung bases. Borderline cardiomegaly. Coronary vascular calcifications noted.  No intra-abdominal free air or free fluid.  Hepatobiliary: The liver is unremarkable. No intrahepatic biliary ductal dilatation. Minimal layering gallstones. No pericholecystic fluid or evidence of acute cholecystitis by CT.  Pancreas: Unremarkable. No pancreatic ductal dilatation or surrounding inflammatory changes.  Spleen: Top-normal spleen measuring up to 13 cm in length.  Adrenals/Urinary Tract: The adrenal glands are unremarkable. There is no hydronephrosis on either side. Subcentimeter left renal inferior pole hypodense focus is too small to characterize. There is symmetric enhancement and excretion of contrast by both kidneys. The visualized ureters and urinary bladder appear unremarkable.  Stomach/Bowel: There is moderate stool throughout the colon. There is no bowel obstruction or active inflammation. There is sigmoid diverticulosis without active inflammatory changes. Appendectomy.  Vascular/Lymphatic: Advanced aortoiliac atherosclerotic disease. The IVC is unremarkable. No portal venous gas. There is no adenopathy. Severe atherosclerotic calcification of the superficial femoral arteries bilaterally most severely involving the right lower extremity. There is associated high-grade luminal  narrowing and non opacification of the  SFA's bilaterally.  Reproductive: Hysterectomy. No adnexal masses.  Other: There is postsurgical changes of right above knee amputation. There is open wound in the right groin and small pockets of air in the superficial soft tissues of the right groin. There is diffuse edema of the subcutaneous soft tissues of the right hip and right lower extremity which may represent cellulitis. Clinical correlation is recommended. No drainable fluid collection or abscess.  Musculoskeletal: Above knee amputation of the right lower extremity. The edge of the right femoral stump appears sharp and smooth. There is some callus formation adjacent to the distal end of the right femoral stump. No periosteal elevation or bone erosion. There is multilevel degenerative changes of the spine. No acute fracture.  IMPRESSION: 1. Postsurgical changes of right above knee amputation. There is an open wound in the right groin and small pockets of air in the superficial soft tissues of the right groin, likely postsurgical. No drainable fluid collection or abscess. 2. Diffuse edema of the subcutaneous soft tissues of the right hip and right lower extremity may represent cellulitis. Clinical correlation is recommended. 3. No bowel obstruction or active inflammation. Normal appendix. 4. Sigmoid diverticulosis. 5. Small bilateral pleural effusions with partial compressive atelectasis of the lung bases. 6. Aortic Atherosclerosis (ICD10-I70.0).   Electronically Signed   By: Anner Crete M.D.  CBC    Component Value Date/Time   WBC 1.7 (L) 03/16/2019 0508   RBC 3.06 (L) 03/16/2019 0508   HGB 9.2 (L) 03/16/2019 0508   HCT 27.9 (L) 03/16/2019 0508   PLT 75 (L) 03/16/2019 0508   MCV 91.2 03/16/2019 0508   MCH 30.1 03/16/2019 0508   MCHC 33.0 03/16/2019 0508   RDW 14.9 03/16/2019 0508   LYMPHSABS 0.4 (L) 03/15/2019 2300   MONOABS 0.3 03/15/2019 2300   EOSABS 0.0 03/15/2019 2300   BASOSABS 0.0  03/15/2019 2300    BMET    Component Value Date/Time   NA 144 03/16/2019 0508   NA 137 11/04/2018 1132   K 4.0 03/16/2019 0508   CL 112 (H) 03/16/2019 0508   CO2 21 (L) 03/16/2019 0508   GLUCOSE 148 (H) 03/16/2019 0508   BUN 41 (H) 03/16/2019 0508   BUN 24 11/04/2018 1132   CREATININE 0.98 03/16/2019 0508   CALCIUM 9.3 03/16/2019 0508   GFRNONAA 55 (L) 03/16/2019 0508   GFRAA >60 03/16/2019 0508     Intake/Output Summary (Last 24 hours) at 03/16/2019 0925 Last data filed at 03/16/2019 0600 Gross per 24 hour  Intake 500 ml  Output --  Net 500 ml    HOSPITAL MEDICATIONS Scheduled Meds: . collagenase  1 application Topical Daily  . enoxaparin (LOVENOX) injection  40 mg Subcutaneous Daily  . insulin aspart  0-15 Units Subcutaneous TID WC  . insulin aspart  0-5 Units Subcutaneous QHS   Continuous Infusions: . sodium chloride 100 mL/hr at 03/16/19 0100  . ceFEPime (MAXIPIME) IV    . [START ON 03/17/2019] vancomycin     PRN Meds:.acetaminophen **OR** acetaminophen, ondansetron **OR** ondansetron (ZOFRAN) IV, oxyCODONE-acetaminophen  Assessment:  79 y.o. female is s/p:  12/7: aortogram. Right EIA stent, right pop art stent 12/8: right fem-pop bypass, repair left pseudoaneurysm 12/14: right AKA 12/29: right groin debridement with sartorius muscle rotational flap  She had maximal temperature to 102 yesterday at approx. 5:30pm. Tmax today 98.6. Non toxic appearing. CT performed without findings of abscess or fluid collection.  Evidence of right thigh cellulitis.  Now on vancomycin and cefepime  Plan: -Continue current antibiotics and follow-up new right groin culture obtained yesterday.  We will continue damp-to-damp dressing changes to groin wound. Stable LLE. -DVT prophylaxis:  Lovenox   Risa Grill, PA-C [facility] [phone] 03/16/2019  9:25 AM

## 2019-03-16 NOTE — Progress Notes (Signed)
Physical Therapy Wound Treatment Patient Details  Name: Debra Hopkins MRN: [id number] Date of Birth: [birth year]  Today's Date: 03/16/2019 Time: 1320-1430 Time Calculation (min): 70 min  Subjective  Subjective: Pt appears anxious and is tearful throughout session. "I don't know if I should be a part of this or not." Patient and Family Stated Goals: Go back home and do what she was doing there to "manage." Prior Treatments: Off loading and dressing changes by nursing staff  Pain Score:  Pt tearful throughout session and moaning at times. Not able to specifically localize pain - when asked she says "everywhere".  Wound Assessment  Pressure Injury 03/08/19 Coccyx Right;Left;Medial Unstageable - Full thickness tissue loss in which the base of the injury is covered by slough (yellow, tan, gray, green or brown) and/or eschar (tan, brown or black) in the wound bed. yellow, black and gr (Active)  Wound Image   03/16/19 1400  Dressing Type ABD;Barrier Film (skin prep);Gauze (Comment);Moist to dry 03/16/19 1400  Dressing Changed;Clean;Dry;Intact 03/16/19 1400  Dressing Change Frequency Daily 03/16/19 1400  State of Healing Non-healing 03/16/19 1400  Site / Wound Assessment Red;Yellow;Black 03/16/19 1400  % Wound base Red or Granulating 5% 03/16/19 1400  % Wound base Yellow/Fibrinous Exudate 30% 03/16/19 1400  % Wound base Black/Eschar 65% 03/16/19 1400  % Wound base Other/Granulation Tissue (Comment) 0% 03/16/19 1400  Peri-wound Assessment Pink 03/16/19 1400  Wound Length (cm) 5.2 cm 03/16/19 1400  Wound Width (cm) 4.5 cm 03/16/19 1400  Wound Depth (cm) 0.1 cm 03/16/19 1400  Wound Surface Area (cm^2) 23.4 cm^2 03/16/19 1400  Wound Volume (cm^3) 2.34 cm^3 03/16/19 1400  Margins Unattached edges (unapproximated) 03/16/19 1400  Drainage Amount Minimal 03/16/19 1400  Drainage Description Serosanguineous 03/16/19 1400  Treatment Debridement (Selective);Hydrotherapy (Pulse lavage);Packing (Saline  gauze) 03/16/19 1400   Santyl applied to wound bed prior to applying dressing.     Hydrotherapy Pulsed lavage therapy - wound location: Sacrum Pulsed Lavage with Suction (psi): 8 psi Pulsed Lavage with Suction - Normal Saline Used: 1000 mL Pulsed Lavage Tip: Tip with splash shield Selective Debridement Selective Debridement - Location: Sacrum Selective Debridement - Tools Used: Forceps;Scalpel Selective Debridement - Tissue Removed: Black and yellow unviable tissue   Wound Assessment and Plan  Wound Therapy - Assess/Plan/Recommendations Wound Therapy - Clinical Statement: Pt presents to hydrotherapy with unstagable pressure injury. At this time necrotic tissue is tightly adhered. She will benefit from continued hydrotherapy for selective removal of unviable tissue, to decrease bioburden and promote wound bed healing.  Wound Therapy - Functional Problem List: Global weakness in the setting of multiple wounds, R AKA, and gross debility from prolonged hospital stay.  Factors Delaying/Impairing Wound Healing: Diabetes Mellitus;Immobility;Multiple medical problems Hydrotherapy Plan: Debridement;Dressing change;Pulsatile lavage with suction;Patient/family education Wound Therapy - Frequency: 6X / week Wound Therapy - Follow Up Recommendations: Other (comment)(CIR vs SNF) Wound Plan: See above  Wound Therapy Goals- Improve the function of patient's integumentary system by progressing the wound(s) through the phases of wound healing (inflammation - proliferation - remodeling) by: Decrease Necrotic Tissue to: 20% Decrease Necrotic Tissue - Progress: Goal set today Increase Granulation Tissue to: 75% Increase Granulation Tissue - Progress: Goal set today Improve Drainage Characteristics: Min;Serous Improve Drainage Characteristics - Progress: Goal set today Goals/treatment plan/discharge plan were made with and agreed upon by patient/family: Yes Time For Goal Achievement: 7 days Wound Therapy  - Potential for Goals: Good  Goals will be updated until maximal potential achieved or discharge criteria met.  Discharge criteria: when goals achieved, discharge from hospital, MD decision/surgical intervention, no progress towards goals, refusal/missing three consecutive treatments without notification or medical reason.  GP     [name] 03/16/2019, 3:11 PM   Rolinda Roan, PT, DPT Acute Rehabilitation Services Pager: [phone] Office: [phone]

## 2019-03-16 NOTE — H&P (Signed)
History and Physical   Debra Hopkins [id number] DOB: 28-May-1940 DOA: 03/15/2019  Referring MD/NP/PA: From acute rehab  PCP: Monico Blitz, MD   Outpatient Specialists: None  Patient coming from: Acute rehab  Chief Complaint: Sepsis  HPI: Debra Hopkins is a 79 y.o. female with medical history significant of history of CAD, diabetes mellitus, TIA, left CEA 2008, fibromyalgia, macular degeneration, peripheral vascular disease with multiple revascularization procedures and maintained on low-dose aspirin. History taken from chart review and patient. Presented on 02/15/2019 with chronic RLE ischemic changes and toe ulceration. Recent aortogram showed aortic and iliac segments heavily calcified. 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 [name] on 02/15/2019. Noted postoperatively mottling of the right foot started on heparin drip. 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 Donnetta Hutching. Hospital course further complicated by acute blood loss anemia patient was transfused 2 units with latest hemoglobin 9.5. Subcutaneous heparin was initiated for DVT prophylaxis 02/23/2019 as well as remaining on low-dose aspirin. Vancomycin resumed 03/01/2023 wound coverage.  Patient admitted to acute rehab where she has now developed fever chills and generally septic.  She is being followed by vascular surgery with a wound VAC and dressing.  Antibiotics was broadened and she is transferred back to acute care for treatment of sepsis probably infected right groin wound.  Review of Systems: As per HPI otherwise 10 point review of systems negative.    Past Medical History:  Diagnosis Date  . Arthritis   . CAD (coronary artery  disease)   . Cancer ([place])   . Carotid artery occlusion   . Cataracts, bilateral   . DDD (degenerative disc disease)   . Diabetes mellitus    Type 2  . Diverticulitis   . Dyspnea   . Fibromyalgia   . GERD (gastroesophageal reflux disease)   . Heart murmur   . Hemorrhoids   . History of shingles   . Impaired hearing   . Macular degeneration   . Other and unspecified hyperlipidemia   . PVD (peripheral vascular disease) ([place])   . TIA (transient ischemic attack)   . Unspecified essential hypertension   . Varicose veins     Past Surgical History:  Procedure Laterality Date  . ABDOMINAL AORTOGRAM W/LOWER EXTREMITY Bilateral 02/15/2019   Procedure: ABDOMINAL AORTOGRAM W/LOWER EXTREMITY;  Surgeon: Waynetta Sandy, MD;  Location: [facility];  Service: Cardiovascular;  Laterality: Bilateral;  . AMPUTATION Right 02/22/2019   Procedure: RIGHT ABOVE KNEE AMPUTATION;  Surgeon: Rosetta Posner, MD;  Location: [facility];  Service: Vascular;  Laterality: Right;  . ANGIOPLASTY     left leg 2003  . APPENDECTOMY    . APPLICATION OF WOUND VAC Right 03/09/2019   Procedure: APPLICATION OF WOUND VAC RIGHT GROIN;  Surgeon: Waynetta Sandy, MD;  Location: [facility];  Service: Vascular;  Laterality: Right;  . aspiration of a cyst in the right breast    . BALLOON DILATION N/A 10/01/2013   Procedure: BALLOON DILATION;  Surgeon: Rogene Houston, MD;  [facility];  Service: Endoscopy;  Laterality: N/A;  . BREAST BIOPSY    . BREAST BIOPSY Left   . CARDIAC CATHETERIZATION    . CAROTID ENDARTERECTOMY  12-03-06  left CEA  . CATARACT EXTRACTION W/PHACO Right 10/14/2013   Procedure: CATARACT EXTRACTION PHACO AND INTRAOCULAR LENS PLACEMENT (IOC);  Surgeon: Tonny Branch, MD;  Location: [facility];  Service: Ophthalmology;  Laterality: Right;  CDE 7.20  . CATARACT EXTRACTION W/PHACO Left 11/04/2013   Procedure: CATARACT EXTRACTION PHACO AND INTRAOCULAR LENS PLACEMENT (IOC);  Surgeon: Tonny Branch, MD;  Location: [facility];  Service: Ophthalmology;  Laterality: Left;  CDE 8.57  . COLONOSCOPY N/A 09/19/2014   Procedure: COLONOSCOPY;  Surgeon: Rogene Houston, MD;  [facility];  Service: Endoscopy;  Laterality: N/A;  7:30-8:30  . CYSTO WITH HYDRODISTENSION N/A 03/29/2016   Procedure: CYSTOSCOPY/HYDRODISTENSION;  Surgeon: Irine Seal, MD;  [facility];  Service: Urology;  Laterality: N/A;  . CYSTOSCOPY WITH INJECTION N/A 03/29/2016   Procedure: CYSTOSCOPY WITH PYRIDIUM AND MARCAINE INSTALLATION;  Surgeon: Irine Seal, MD;  [facility];  Service: Urology;  Laterality: N/A;  . ESOPHAGOGASTRODUODENOSCOPY N/A 10/01/2013   Procedure: ESOPHAGOGASTRODUODENOSCOPY (EGD);  Surgeon: Rogene Houston, MD;  [facility];  Service: Endoscopy;  Laterality: N/A;  120  . ESOPHAGOGASTRODUODENOSCOPY N/A 10/24/2014   Procedure: ESOPHAGOGASTRODUODENOSCOPY (EGD);  Surgeon: Rogene Houston, MD;  [facility];  Service: Endoscopy;  Laterality: N/A;  730  . FALSE ANEURYSM REPAIR Left 02/16/2019   Procedure: REPAIR LEFT FEMORAL PSEUDOANEURYSM;  Surgeon: Waynetta Sandy, MD;  Location: [facility] [facility];  Service: Vascular;  Laterality: Left;  . FEMORAL-POPLITEAL BYPASS GRAFT Right 02/16/2019   Procedure: RIGHT BYPASS GRAFT FEMORAL-POPLITEAL ARTERY USING NON-REVERSED RIGHT GREATER SAPHENOUS VEIN;  Surgeon: Waynetta Sandy, MD;  Location: [place];  Service: Vascular;  Laterality: Right;  . GIVENS CAPSULE STUDY N/A 10/24/2014   Procedure: GIVENS CAPSULE STUDY;  Surgeon: Rogene Houston, MD;  [facility];  Service: Endoscopy;  Laterality: N/A;  . JOINT REPLACEMENT  2011   Right total knee replacement  . [place]  . KNEE ARTHROSCOPY  2009  . LOWER EXTREMITY ANGIOGRAM Right 02/16/2019   Procedure: Lower Extremity Angiogram;  Surgeon: Waynetta Sandy, MD;  Location: [facility];  Service: Vascular;  Laterality: Right;  . MALONEY DILATION N/A  10/01/2013   Procedure: Venia Minks DILATION;  Surgeon: Rogene Houston, MD;  [facility];  Service: Endoscopy;  Laterality: N/A;  . PERIPHERAL VASCULAR INTERVENTION Right 02/15/2019   Procedure: PERIPHERAL VASCULAR INTERVENTION;  Surgeon: Waynetta Sandy, MD;  Location: [facility];  Service: Cardiovascular;  Laterality: Right;  external iliac, SFA, popliteal  . SAVORY DILATION N/A 10/01/2013   Procedure: SAVORY DILATION;  Surgeon: Rogene Houston, MD;  [facility];  Service: Endoscopy;  Laterality: N/A;  . SPINE SURGERY  1997   C5-c6 fusion  . TOTAL ABDOMINAL HYSTERECTOMY  1973  . WOUND DEBRIDEMENT Right 03/09/2019   Procedure: DEBRIDEMENT WOUND RIGHT GROIN;  Surgeon: Waynetta Sandy, MD;  Location: [place];  Service: Vascular;  Laterality: Right;     reports that she quit smoking about 34 years ago. Her smoking use included cigarettes. She has never used smokeless tobacco. She reports that she does not drink alcohol or use drugs.  Allergies  Allergen Reactions  . Byetta 10 Mcg Pen [Exenatide] Other (See Comments)    Unknown  . Ceclor [Cefaclor] Other (See Comments)    Unknown  . Celexa [Citalopram Hydrobromide] Other (See Comments)    Sick  . Ciprofloxacin Nausea Only and Other (See Comments)    Dizziness  .  Claritin [Loratadine] Other (See Comments)    Hair fell out  . Erythromycin Other (See Comments)    REACTION: Upset stomach  . Flexeril [Cyclobenzaprine] Other (See Comments)    Nervous  . Fosamax [Alendronate] Other (See Comments)    Made pt sick  . Jardiance [Empagliflozin] Other (See Comments)    Vaginal infection, UTI  . Motrin [Ibuprofen] Nausea Only  . Naproxen Other (See Comments)    REACTION: severe nausea  . Norvasc [Amlodipine Besylate] Other (See Comments)    Unknown  . Prednisone Other (See Comments)    REACTION: itchy rash  . Prozac [Fluoxetine Hcl] Other (See Comments)    Sick  . Sulfamethoxazole-Trimethoprim  Nausea And Vomiting and Other (See Comments)    REACTION: Upset stomach    Family History  Problem Relation Age of Onset  . Stroke Mother   . Hypertension Mother   . Stroke Father   . Hypertension Father   . Diabetes Father   . Coronary artery disease Father   . Heart disease Father   . Diabetes Sister   . Hypertension Sister   . Lung cancer Brother   . Sinusitis Brother   . Healthy Brother   . Diabetes Son   . Hypertension Son   . Coronary artery disease Other      Prior to Admission medications   Medication Sig Start Date End Date Taking? Authorizing Provider  ACCU-CHEK AVIVA PLUS test strip TEST FOUR TIMES DAILY AS DIRECTED 08/04/18   Cassandria Anger, MD  Accu-Chek Softclix Lancets lancets USE ONE DAILY AS DIRECTED - THIS IS A 100 DAY SUPPLY 05/20/18   Nida, Marella Chimes, MD  allopurinol (ZYLOPRIM) 100 MG tablet Take 100 mg by mouth daily as needed (Gout).  01/19/19   [provider]  aspirin EC 81 MG tablet Take 81 mg by mouth daily.    [provider]  Boswellia-Glucosamine-Vit D (OSTEO BI-FLEX ONE PER DAY PO) Take 1 tablet by mouth every other day.     [provider]  Calcium Citrate 250 MG TABS Take 200 mg of elemental calcium by mouth daily.    [provider]  Carboxymeth-Glycerin-Polysorb (REFRESH OPTIVE ADVANCED) 0.5-1-0.5 % SOLN Apply 1 drop to eye daily as needed (for dry eyes).     [provider]  Cholecalciferol (VITAMIN D3) 50 MCG (2000 UT) TABS Take 2,000 Units by mouth daily.     [provider]  clobetasol (TEMOVATE) 0.05 % external solution Apply 1 application topically daily as needed (eczema).    [provider]  cloNIDine (CATAPRES) 0.1 MG tablet Take 0.2 mg by mouth at bedtime.     [provider]  esomeprazole (NEXIUM) 20 MG capsule Take 20 mg by mouth daily before breakfast.     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    [provider]  GLOBAL EASE INJECT PEN NEEDLES 32G X 4 MM MISC USE ONCE DAILY WITH SOLOSTAR INSULIN 09/01/18   Cassandria Anger, MD  hydrALAZINE (APRESOLINE) 25 MG tablet Take 25 mg by mouth at bedtime.     [provider]  insulin glargine (LANTUS) 100 UNIT/ML injection Inject 0.1 mLs (10 Units total) into the skin at bedtime. 03/02/19   Setzer, Edman Circle, PA-C  ketotifen (ALAWAY) 0.025 % ophthalmic solution Place 1 drop into both eyes daily as needed (for allergies).     [provider]  Astrid Drafts  Omega-3 500 MG CAPS Take 1 tablet by mouth daily.     [provider]  levothyroxine (SYNTHROID) 75 MCG tablet TAKE 1 TABLET BY MOUTH EVERY DAY BEFORE BREAKFAST Patient taking differently: Take 50 mcg by mouth daily before breakfast.  11/18/18   Nida, Marella Chimes, MD  losartan (COZAAR) 100 MG tablet Take 100 mg by mouth daily.      [provider]  methocarbamol (ROBAXIN) 750 MG tablet Take 750 mg by mouth 2 (two) times daily as needed for muscle spasms.     [provider]  Misc Natural Products (TART CHERRY ADVANCED PO) Take 125 mLs by mouth daily. Juice    [provider]  Multiple Vitamins-Minerals (PRESERVISION AREDS 2 PO) Take 2 tablets by mouth daily.     [provider]  NIFEdipine (PROCARDIA-XL/ADALAT-CC/NIFEDICAL-XL) 30 MG 24 hr tablet Take 30 mg by mouth daily.    [provider]  oxyCODONE-acetaminophen (PERCOCET/ROXICET) 5-325 MG tablet Take 1 tablet by mouth every 6 (six) hours as needed for moderate pain.  01/25/19   [provider]  pregabalin (LYRICA) 25 MG capsule Take 25 mg by mouth 3 (three) times daily as needed (Pain).     [provider]  Propylene Glycol (SYSTANE BALANCE OP) Apply 1 drop to eye daily as needed (for dry eyes).     [provider]  simvastatin (ZOCOR) 10 MG tablet Take 10 mg by mouth every other day. In the evening     [provider]  SUPER B COMPLEX/C PO Take 1 tablet by mouth daily.     [provider]  torsemide (DEMADEX) 20 MG tablet Take 20 mg by mouth daily.     [provider]  traZODone (DESYREL) 100 MG tablet Take 100 mg by mouth at bedtime.    [provider]    Physical Exam: There were no vitals filed for this visit.    Constitutional: Weak and uncomfortable There were no vitals filed for this visit. Eyes: PERRL, lids and conjunctivae normal ENMT: Mucous membranes are moist. Posterior pharynx clear of any exudate or lesions.Normal dentition.  Neck: normal, supple, no masses, no thyromegaly Respiratory: clear to auscultation bilaterally, no wheezing, no crackles. Normal respiratory effort. No accessory muscle use.  Cardiovascular: Regular rate and rhythm, no murmurs / rubs / gallops. No extremity edema. 2+ pedal pulses. No carotid bruits.  Abdomen: no tenderness, no masses palpated. No hepatosplenomegaly. Bowel sounds positive.  Musculoskeletal: no clubbing / cyanosis. No joint deformity upper and lower extremities. Good ROM, no contractures. Normal muscle tone.  Status post right BKA Skin: Right groin wound oozing purulent materials, no rashes, lesions, ulcers. No induration Neurologic: CN 2-12 grossly intact. Sensation intact, DTR normal. Strength 5/5 in all 4.  Psychiatric: Normal judgment and insight. Alert and oriented x 3. Normal mood.     Labs on Admission: I have personally reviewed following labs and imaging studies  CBC: Recent Labs  Lab 03/12/19 0357 03/13/19 0439 03/14/19 0327 03/15/19 0544 03/15/19 2300  WBC 3.3* 2.4* 1.6* 1.8* 1.5*  NEUTROABS 1.9 1.3* 0.8* 1.3* 0.8*  HGB 7.6* 7.4* 6.9* 8.8* 8.9*  HCT 23.1* 23.3* 21.6* 26.5* 27.6*  MCV 92.8 95.1 96.4 92.0 93.6  PLT 95* 88* 79* 74* 72*   Basic Metabolic Panel: Recent Labs  Lab 03/10/19 0453 03/12/19 0357 03/13/19 0439 03/14/19 0327 03/15/19 0544  NA 138 137 143 142 144    K 4.0 4.0 4.6 4.3 4.0  CL 102 101 107 109  110  CO2 26 26 26 24 25   GLUCOSE 199* 198* 182* 198* 170*  BUN 48* 75* 69* 66* 53*  CREATININE 1.54* 1.75* 1.32* 1.14* 1.06*  CALCIUM 9.0 8.7* 8.8* 8.9 8.9   GFR: Estimated Creatinine Clearance: 41.3 mL/min (A) (by C-G formula based on SCr of 1.06 mg/dL (H)). Liver Function Tests: Recent Labs  Lab 03/12/19 0357 03/15/19 0544  AST 21 23  ALT 12 15  ALKPHOS 62 60  BILITOT 0.4 0.4  PROT 4.8* 4.5*  ALBUMIN 1.9* 1.6*   No results for input(s): LIPASE, AMYLASE in the last 168 hours. No results for input(s): AMMONIA in the last 168 hours. Coagulation Profile: No results for input(s): INR, PROTIME in the last 168 hours. Cardiac Enzymes: No results for input(s): CKTOTAL, CKMB, CKMBINDEX, TROPONINI in the last 168 hours. BNP (last 3 results) No results for input(s): PROBNP in the last 8760 hours. HbA1C: No results for input(s): HGBA1C in the last 72 hours. CBG: Recent Labs  Lab 03/14/19 2120 03/15/19 0600 03/15/19 1151 03/15/19 1637 03/15/19 2307  GLUCAP 163* 138* 140* 162* 192*   Lipid Profile: No results for input(s): CHOL, HDL, LDLCALC, TRIG, CHOLHDL, LDLDIRECT in the last 72 hours. Thyroid Function Tests: No results for input(s): TSH, T4TOTAL, FREET4, T3FREE, THYROIDAB in the last 72 hours. Anemia Panel: No results for input(s): VITAMINB12, FOLATE, FERRITIN, TIBC, IRON, RETICCTPCT in the last 72 hours. Urine analysis:    Component Value Date/Time   COLORURINE YELLOW 03/14/2019 [place] 03/14/2019 1437   LABSPEC 1.013 03/14/2019 1437   PHURINE 6.0 03/14/2019 1437   GLUCOSEU NEGATIVE 03/14/2019 1437   HGBUR NEGATIVE 03/14/2019 1437   BILIRUBINUR NEGATIVE 03/14/2019 1437   KETONESUR NEGATIVE 03/14/2019 1437   PROTEINUR 100 (A) 03/14/2019 1437   UROBILINOGEN 0.2 01/19/2010 1220   NITRITE NEGATIVE 03/14/2019 1437   LEUKOCYTESUR NEGATIVE 03/14/2019 1437   Sepsis  Labs: @LABRCNTIP (procalcitonin:4,lacticidven:4) )No results found for this or any previous visit (from the past 240 hour(s)).   Radiological Exams on Admission: CT ABDOMEN PELVIS W CONTRAST  Result Date: 03/15/2019 CLINICAL DATA:  79 year old female status post right above knee amputation and muscle flap to the right groin incision. Concern for cellulitis/soft tissue infection. EXAM: CT ABDOMEN AND PELVIS WITH CONTRAST TECHNIQUE: Multidetector CT imaging of the abdomen and pelvis was performed using the standard protocol following bolus administration of intravenous contrast. CONTRAST:  120mL OMNIPAQUE IOHEXOL 300 MG/ML  SOLN COMPARISON:  CT of the abdomen pelvis dated 09/05/2018. FINDINGS: Lower chest: Partially visualized small bilateral pleural effusions with associated partial compressive atelectasis of the lung bases. Borderline cardiomegaly. Coronary vascular calcifications noted. No intra-abdominal free air or free fluid. Hepatobiliary: The liver is unremarkable. No intrahepatic biliary ductal dilatation. Minimal layering gallstones. No pericholecystic fluid or evidence of acute cholecystitis by CT. Pancreas: Unremarkable. No pancreatic ductal dilatation or surrounding inflammatory changes. Spleen: Top-normal spleen measuring up to 13 cm in length. Adrenals/Urinary Tract: The adrenal glands are unremarkable. There is no hydronephrosis on either side. Subcentimeter left renal inferior pole hypodense focus is too small to characterize. There is symmetric enhancement and excretion of contrast by both kidneys. The visualized ureters and urinary bladder appear unremarkable. Stomach/Bowel: There is moderate stool throughout the colon. There is no bowel obstruction or active inflammation. There is sigmoid diverticulosis without active inflammatory changes. Appendectomy. Vascular/Lymphatic: Advanced aortoiliac atherosclerotic disease. The IVC is unremarkable. No portal venous gas. There is no adenopathy. Severe  atherosclerotic calcification of the superficial femoral arteries bilaterally most severely  involving the right lower extremity. There is associated high-grade luminal narrowing and non opacification of the SFA's bilaterally. Reproductive: Hysterectomy. No adnexal masses. Other: There is postsurgical changes of right above knee amputation. There is open wound in the right groin and small pockets of air in the superficial soft tissues of the right groin. There is diffuse edema of the subcutaneous soft tissues of the right hip and right lower extremity which may represent cellulitis. Clinical correlation is recommended. No drainable fluid collection or abscess. Musculoskeletal: Above knee amputation of the right lower extremity. The edge of the right femoral stump appears sharp and smooth. There is some callus formation adjacent to the distal end of the right femoral stump. No periosteal elevation or bone erosion. There is multilevel degenerative changes of the spine. No acute fracture. IMPRESSION: 1. Postsurgical changes of right above knee amputation. There is an open wound in the right groin and small pockets of air in the superficial soft tissues of the right groin, likely postsurgical. No drainable fluid collection or abscess. 2. Diffuse edema of the subcutaneous soft tissues of the right hip and right lower extremity may represent cellulitis. Clinical correlation is recommended. 3. No bowel obstruction or active inflammation. Normal appendix. 4. Sigmoid diverticulosis. 5. Small bilateral pleural effusions with partial compressive atelectasis of the lung bases. 6. Aortic Atherosclerosis (ICD10-I70.0). Electronically Signed   By: Anner Crete M.D.   On: 03/15/2019 22:56   CT FEMUR RIGHT W CONTRAST  Result Date: 03/15/2019 CLINICAL DATA:  79 year old female status post right above knee amputation and muscle flap to the right groin incision. Concern for cellulitis/soft tissue infection. EXAM: CT ABDOMEN AND  PELVIS WITH CONTRAST TECHNIQUE: Multidetector CT imaging of the abdomen and pelvis was performed using the standard protocol following bolus administration of intravenous contrast. CONTRAST:  184mL OMNIPAQUE IOHEXOL 300 MG/ML  SOLN COMPARISON:  CT of the abdomen pelvis dated 09/05/2018. FINDINGS: Lower chest: Partially visualized small bilateral pleural effusions with associated partial compressive atelectasis of the lung bases. Borderline cardiomegaly. Coronary vascular calcifications noted. No intra-abdominal free air or free fluid. Hepatobiliary: The liver is unremarkable. No intrahepatic biliary ductal dilatation. Minimal layering gallstones. No pericholecystic fluid or evidence of acute cholecystitis by CT. Pancreas: Unremarkable. No pancreatic ductal dilatation or surrounding inflammatory changes. Spleen: Top-normal spleen measuring up to 13 cm in length. Adrenals/Urinary Tract: The adrenal glands are unremarkable. There is no hydronephrosis on either side. Subcentimeter left renal inferior pole hypodense focus is too small to characterize. There is symmetric enhancement and excretion of contrast by both kidneys. The visualized ureters and urinary bladder appear unremarkable. Stomach/Bowel: There is moderate stool throughout the colon. There is no bowel obstruction or active inflammation. There is sigmoid diverticulosis without active inflammatory changes. Appendectomy. Vascular/Lymphatic: Advanced aortoiliac atherosclerotic disease. The IVC is unremarkable. No portal venous gas. There is no adenopathy. Severe atherosclerotic calcification of the superficial femoral arteries bilaterally most severely involving the right lower extremity. There is associated high-grade luminal narrowing and non opacification of the SFA's bilaterally. Reproductive: Hysterectomy. No adnexal masses. Other: There is postsurgical changes of right above knee amputation. There is open wound in the right groin and small pockets of air  in the superficial soft tissues of the right groin. There is diffuse edema of the subcutaneous soft tissues of the right hip and right lower extremity which may represent cellulitis. Clinical correlation is recommended. No drainable fluid collection or abscess. Musculoskeletal: Above knee amputation of the right lower extremity. The edge of the right femoral  stump appears sharp and smooth. There is some callus formation adjacent to the distal end of the right femoral stump. No periosteal elevation or bone erosion. There is multilevel degenerative changes of the spine. No acute fracture. IMPRESSION: 1. Postsurgical changes of right above knee amputation. There is an open wound in the right groin and small pockets of air in the superficial soft tissues of the right groin, likely postsurgical. No drainable fluid collection or abscess. 2. Diffuse edema of the subcutaneous soft tissues of the right hip and right lower extremity may represent cellulitis. Clinical correlation is recommended. 3. No bowel obstruction or active inflammation. Normal appendix. 4. Sigmoid diverticulosis. 5. Small bilateral pleural effusions with partial compressive atelectasis of the lung bases. 6. Aortic Atherosclerosis (ICD10-I70.0). Electronically Signed   By: Anner Crete M.D.   On: 03/15/2019 22:56      Assessment/Plan Principal Problem:   Sepsis ([place]) Active Problems:   Mixed hyperlipidemia   Essential hypertension, benign   Coronary atherosclerosis   PVD   Hypothyroidism   GERD (gastroesophageal reflux disease)   Type 2 diabetes mellitus with stage 3 chronic kidney disease, without long-term current use of insulin (HCC)   S/P AKA (above knee amputation) unilateral, right ([place])     #1 sepsis: Secondary to right groin wound infection most likely.  Patient will be transferred to progressive care.  IV fluids IV vancomycin and cefepime.  Cultures to be followed.  Appreciate vascular surgery who will continue to follow.   Wound care per surgery.  Rule out febrile neutropenia  #2 coronary artery disease: No exacerbation.  #3 GERD: Continue with PPIs  #4 hypothyroidism: Continue with levothyroxine.  #5 hypertension: Resume antiblood pressure medications and adjust as necessary.  #6 peripheral vascular disease: Status post right BKA  #7 neutropenia: Patient has significant leukopenia and neutropenia.  Because not quite clear.  Broadening antibiotics as this could be febrile neutropenia   DVT prophylaxis: Lovenox Code Status: Full code Family Communication: No family at bedside Disposition Plan: To be determined probably back to rehab Consults called: Vascular surgery to continue following patient Admission status: Inpatient  Severity of Illness: The appropriate patient status for this patient is INPATIENT. Inpatient status is judged to be reasonable and necessary in order to provide the required intensity of service to ensure the patient's safety. The patient's presenting symptoms, physical exam findings, and initial radiographic and laboratory data in the context of their chronic comorbidities is felt to place them at high risk for further clinical deterioration. Furthermore, it is not anticipated that the patient will be medically stable for discharge from the hospital within 2 midnights of admission. The following factors support the patient status of inpatient.   " The patient's presenting symptoms include fever and chills. " The worrisome physical exam findings include right groin pain. " The initial radiographic and laboratory data are worrisome because of leukocytosis. " The chronic co-morbidities include peripheral vascular disease.   * I certify that at the point of admission it is my clinical judgment that the patient will require inpatient hospital care spanning beyond 2 midnights from the point of admission due to high intensity of service, high risk for further deterioration and high frequency  of surveillance required.Barbette Merino MD [facility] Pager [phone]  If 7PM-7AM, please contact night-coverage [email or web address] Password [email or web address]  03/16/2019, 12:20 AM

## 2019-03-16 NOTE — Discharge Summary (Deleted)
  The note originally documented on this encounter has been moved the the encounter in which it belongs.  

## 2019-03-17 ENCOUNTER — Encounter (HOSPITAL_COMMUNITY): Payer: Self-pay | Admitting: Internal Medicine

## 2019-03-17 ENCOUNTER — Inpatient Hospital Stay (HOSPITAL_COMMUNITY): Payer: Medicare Other | Admitting: Certified Registered Nurse Anesthetist

## 2019-03-17 ENCOUNTER — Encounter (HOSPITAL_COMMUNITY)
Admission: AD | Disposition: A | Discharge status: Hospice | Payer: Self-pay | Source: Ambulatory Visit | Attending: Internal Medicine

## 2019-03-17 DIAGNOSIS — D7581 Myelofibrosis: Secondary | ICD-10-CM

## 2019-03-17 DIAGNOSIS — E782 Mixed hyperlipidemia: Secondary | ICD-10-CM

## 2019-03-17 DIAGNOSIS — D61818 Other pancytopenia: Secondary | ICD-10-CM

## 2019-03-17 DIAGNOSIS — I9789 Other postprocedural complications and disorders of the circulatory system, not elsewhere classified: Secondary | ICD-10-CM

## 2019-03-17 HISTORY — PX: WOUND DEBRIDEMENT: SHX247

## 2019-03-17 LAB — GLUCOSE, CAPILLARY
Glucose-Capillary: 112 mg/dL — ABNORMAL HIGH (ref 70–99)
Glucose-Capillary: 110 mg/dL — ABNORMAL HIGH (ref 70–99)
Glucose-Capillary: 168 mg/dL — ABNORMAL HIGH (ref 70–99)
Glucose-Capillary: 187 mg/dL — ABNORMAL HIGH (ref 70–99)
Glucose-Capillary: 138 mg/dL — ABNORMAL HIGH (ref 70–99)

## 2019-03-17 LAB — BASIC METABOLIC PANEL
Anion gap: 10 (ref 5–15)
BUN: 26 mg/dL — ABNORMAL HIGH (ref 8–23)
CO2: 19 mmol/L — ABNORMAL LOW (ref 22–32)
Calcium: 9.2 mg/dL (ref 8.9–10.3)
Chloride: 118 mmol/L — ABNORMAL HIGH (ref 98–111)
Creatinine, Ser: 0.81 mg/dL (ref 0.44–1.00)
GFR calc Af Amer: >60 mL/min (ref >60)
GFR calc non Af Amer: >60 mL/min (ref >60)
Glucose, Bld: 117 mg/dL — ABNORMAL HIGH (ref 70–99)
Potassium: 3.5 mmol/L (ref 3.5–5.1)
Sodium: 147 mmol/L — ABNORMAL HIGH (ref 135–145)

## 2019-03-17 LAB — SAVE SMEAR(SSMR), FOR PROVIDER SLIDE REVIEW

## 2019-03-17 LAB — CBC
HCT: 29.7 % — ABNORMAL LOW (ref 36.0–46.0)
Hemoglobin: 9.6 g/dL — ABNORMAL LOW (ref 12.0–15.0)
MCH: 30.4 pg (ref 26.0–34.0)
MCHC: 32.3 g/dL (ref 30.0–36.0)
MCV: 94 fL (ref 80.0–100.0)
Platelets: 65 10*3/uL — ABNORMAL LOW (ref 150–400)
RBC: 3.16 MIL/uL — ABNORMAL LOW (ref 3.87–5.11)
RDW: 14.6 % (ref 11.5–15.5)
WBC: 1.4 10*3/uL — CL (ref 4.0–10.5)
nRBC: 0 % (ref 0.0–0.2)

## 2019-03-17 LAB — MAGNESIUM: Magnesium: 2.1 mg/dL (ref 1.7–2.4)

## 2019-03-17 SURGERY — DEBRIDEMENT, WOUND
Anesthesia: General | Site: Groin | Laterality: Right

## 2019-03-17 MED ORDER — FENTANYL CITRATE (PF) 100 MCG/2ML IJ SOLN
25.0000 ug | INTRAMUSCULAR | Status: DC | PRN
  Administered 2019-03-17 (×2): 50 ug via INTRAVENOUS

## 2019-03-17 MED ORDER — HYDRALAZINE HCL 20 MG/ML IJ SOLN
INTRAMUSCULAR | Status: AC
  Filled 2019-03-17: qty 1

## 2019-03-17 MED ORDER — SODIUM CHLORIDE (PF) 0.9 % IJ SOLN
INTRAMUSCULAR | Status: AC
  Filled 2019-03-17: qty 10

## 2019-03-17 MED ORDER — FENTANYL CITRATE (PF) 100 MCG/2ML IJ SOLN: 25.0000 ug | INTRAMUSCULAR | Status: DC | PRN

## 2019-03-17 MED ORDER — DEXAMETHASONE SODIUM PHOSPHATE 10 MG/ML IJ SOLN
INTRAMUSCULAR | Status: AC
  Filled 2019-03-17: qty 1

## 2019-03-17 MED ORDER — LACTATED RINGERS IV SOLN: INTRAVENOUS | Status: DC

## 2019-03-17 MED ORDER — ROCURONIUM BROMIDE 10 MG/ML (PF) SYRINGE
PREFILLED_SYRINGE | INTRAVENOUS | Status: AC
  Filled 2019-03-17: qty 10

## 2019-03-17 MED ORDER — OXYCODONE HCL 5 MG PO TABS: 5.0000 mg | ORAL_TABLET | Freq: Once | ORAL | Status: DC | PRN

## 2019-03-17 MED ORDER — FENTANYL CITRATE (PF) 250 MCG/5ML IJ SOLN
INTRAMUSCULAR | Status: AC
  Filled 2019-03-17: qty 5

## 2019-03-17 MED ORDER — HEPARIN SODIUM (PORCINE) 1000 UNIT/ML IJ SOLN
INTRAMUSCULAR | Status: AC
  Filled 2019-03-17: qty 1

## 2019-03-17 MED ORDER — HYDRALAZINE HCL 20 MG/ML IJ SOLN
INTRAMUSCULAR | Status: DC | PRN
  Administered 2019-03-17: 5 mg via INTRAVENOUS

## 2019-03-17 MED ORDER — PROMETHAZINE HCL 25 MG/ML IJ SOLN: 6.2500 mg | INTRAMUSCULAR | Status: DC | PRN

## 2019-03-17 MED ORDER — LIDOCAINE 2% (20 MG/ML) 5 ML SYRINGE
INTRAMUSCULAR | Status: AC
  Filled 2019-03-17: qty 5

## 2019-03-17 MED ORDER — FENTANYL CITRATE (PF) 250 MCG/5ML IJ SOLN
INTRAMUSCULAR | Status: DC | PRN
  Administered 2019-03-17 (×6): 25 ug via INTRAVENOUS

## 2019-03-17 MED ORDER — 0.9 % SODIUM CHLORIDE (POUR BTL) OPTIME
TOPICAL | Status: DC | PRN
  Administered 2019-03-17: 1000 mL

## 2019-03-17 MED ORDER — SODIUM CHLORIDE 0.9 % IR SOLN
Status: DC | PRN
  Administered 2019-03-17: 3000 mL

## 2019-03-17 MED ORDER — ONDANSETRON HCL 4 MG/2ML IJ SOLN
INTRAMUSCULAR | Status: DC | PRN
  Administered 2019-03-17: 4 mg via INTRAVENOUS

## 2019-03-17 MED ORDER — ARTIFICIAL TEARS OPHTHALMIC OINT
TOPICAL_OINTMENT | OPHTHALMIC | Status: AC
  Filled 2019-03-17: qty 3.5

## 2019-03-17 MED ORDER — PROPOFOL 10 MG/ML IV BOLUS
INTRAVENOUS | Status: AC
  Filled 2019-03-17: qty 20

## 2019-03-17 MED ORDER — ONDANSETRON HCL 4 MG/2ML IJ SOLN: 4.0000 mg | Freq: Once | INTRAMUSCULAR | Status: DC | PRN

## 2019-03-17 MED ORDER — OXYCODONE HCL 5 MG/5ML PO SOLN: 5.0000 mg | Freq: Once | ORAL | Status: DC | PRN

## 2019-03-17 MED ORDER — ONDANSETRON HCL 4 MG/2ML IJ SOLN
INTRAMUSCULAR | Status: AC
  Filled 2019-03-17: qty 2

## 2019-03-17 MED ORDER — PROPOFOL 10 MG/ML IV BOLUS
INTRAVENOUS | Status: DC | PRN
  Administered 2019-03-17: 100 mg via INTRAVENOUS

## 2019-03-17 MED ORDER — FENTANYL CITRATE (PF) 100 MCG/2ML IJ SOLN
INTRAMUSCULAR | Status: AC
  Filled 2019-03-17: qty 2

## 2019-03-17 MED ORDER — LIDOCAINE 2% (20 MG/ML) 5 ML SYRINGE
INTRAMUSCULAR | Status: DC | PRN
  Administered 2019-03-17: 40 mg via INTRAVENOUS

## 2019-03-17 SURGICAL SUPPLY — 33 items
BNDG GAUZE ELAST 4 BULKY (GAUZE/BANDAGES/DRESSINGS) ×2 IMPLANT
CANISTER SUCT 3000ML PPV (MISCELLANEOUS) ×3 IMPLANT
COVER SURGICAL LIGHT HANDLE (MISCELLANEOUS) ×3 IMPLANT
COVER WAND RF STERILE (DRAPES) ×3 IMPLANT
DRAPE IMP U-DRAPE 54X76 (DRAPES) ×2 IMPLANT
DRAPE ORTHO SPLIT 77X108 STRL (DRAPES) ×6
DRAPE SURG ORHT 6 SPLT 77X108 (DRAPES) IMPLANT
DRSG PAD ABDOMINAL 8X10 ST (GAUZE/BANDAGES/DRESSINGS) ×2 IMPLANT
ELECT REM PT RETURN 9FT ADLT (ELECTROSURGICAL) ×3
ELECTRODE REM PT RTRN 9FT ADLT (ELECTROSURGICAL) ×1 IMPLANT
GAUZE SPONGE 4X4 12PLY STRL (GAUZE/BANDAGES/DRESSINGS) ×2 IMPLANT
GAUZE SPONGE 4X4 12PLY STRL LF (GAUZE/BANDAGES/DRESSINGS) ×3 IMPLANT
GLOVE BIO SURGEON STRL SZ7.5 (GLOVE) ×5 IMPLANT
GLOVE BIOGEL PI IND STRL 6.5 (GLOVE) IMPLANT
GLOVE BIOGEL PI INDICATOR 6.5 (GLOVE) ×4
GLOVE ECLIPSE 6.5 STRL STRAW (GLOVE) ×4 IMPLANT
GOWN STRL REUS W/ TWL LRG LVL3 (GOWN DISPOSABLE) ×2 IMPLANT
GOWN STRL REUS W/ TWL XL LVL3 (GOWN DISPOSABLE) ×1 IMPLANT
GOWN STRL REUS W/TWL LRG LVL3 (GOWN DISPOSABLE) ×9
GOWN STRL REUS W/TWL XL LVL3 (GOWN DISPOSABLE) ×6
KIT BASIN OR (CUSTOM PROCEDURE TRAY) ×3 IMPLANT
KIT TURNOVER KIT B (KITS) ×3 IMPLANT
NS IRRIG 1000ML POUR BTL (IV SOLUTION) ×3 IMPLANT
PAD ARMBOARD 7.5X6 YLW CONV (MISCELLANEOUS) ×6 IMPLANT
SET INTERPULSE LAVAGE W/TIP (ORTHOPEDIC DISPOSABLE SUPPLIES) ×2 IMPLANT
SPONGE LAP 18X18 X RAY DECT (DISPOSABLE) ×4 IMPLANT
SUT PROLENE 5 0 C 1 24 (SUTURE) ×2 IMPLANT
SUT VIC AB 2-0 CT1 27 (SUTURE) ×6
SUT VIC AB 2-0 CT1 27XBRD (SUTURE) IMPLANT
TAPE CLOTH SURG 4X10 WHT LF (GAUZE/BANDAGES/DRESSINGS) ×2 IMPLANT
TOWEL GREEN STERILE (TOWEL DISPOSABLE) ×6 IMPLANT
TOWEL GREEN STERILE FF (TOWEL DISPOSABLE) ×3 IMPLANT
WATER STERILE IRR 1000ML POUR (IV SOLUTION) ×3 IMPLANT

## 2019-03-17 NOTE — Transfer of Care (Signed)
Immediate Anesthesia Transfer of Care Note  Patient: Debra Hopkins  Procedure(s) Performed: DEBRIDEMENT WOUND RIGHT GROIN (Right Groin)  Patient Location: PACU  Anesthesia Type:General  Level of Consciousness: drowsy  Airway & Oxygen Therapy: Patient Spontanous Breathing and Patient connected to face mask oxygen  Post-op Assessment: Report given to RN and Post -op Vital signs reviewed and stable  Post vital signs: Reviewed  Last Vitals:  Vitals Value Taken Time  BP 168/67 03/17/19 1221  Temp    Pulse 111 03/17/19 1226  Resp 11 03/17/19 1226  SpO2 98 % 03/17/19 1226  Vitals shown include unvalidated device data.  Last Pain:  Vitals:   03/17/19 0755  TempSrc: Axillary  PainSc:          Complications: No apparent anesthesia complications

## 2019-03-17 NOTE — Plan of Care (Signed)
  Problem: Clinical Measurements: Goal: Ability to maintain clinical measurements within normal limits will improve Outcome: Progressing Goal: Will remain free from infection Outcome: Progressing Goal: Diagnostic test results will improve Outcome: Progressing Goal: Respiratory complications will improve Outcome: Progressing Goal: Cardiovascular complication will be avoided Outcome: Progressing   Problem: Coping: Goal: Level of anxiety will decrease Outcome: Progressing   Problem: Elimination: Goal: Will not experience complications related to bowel motility Outcome: Progressing Goal: Will not experience complications related to urinary retention Outcome: Progressing   Problem: Safety: Goal: Ability to remain free from injury will improve Outcome: Progressing   Problem: Education: Goal: Knowledge of General Education information will improve Description: Including pain rating scale, medication(s)/side effects and non-pharmacologic comfort measures Outcome: Not Progressing   Problem: Health Behavior/Discharge Planning: Goal: Ability to manage health-related needs will improve Outcome: Not Progressing   Problem: Activity: Goal: Risk for activity intolerance will decrease Outcome: Not Progressing   Problem: Nutrition: Goal: Adequate nutrition will be maintained Outcome: Not Progressing   Problem: Pain Managment: Goal: General experience of comfort will improve Outcome: Not Progressing   Problem: Skin Integrity: Goal: Risk for impaired skin integrity will decrease Outcome: Not Progressing

## 2019-03-17 NOTE — Anesthesia Postprocedure Evaluation (Signed)
Anesthesia Post Note  Patient: Debra Hopkins  Procedure(s) Performed: DEBRIDEMENT WOUND RIGHT GROIN (Right Groin)     Patient location during evaluation: PACU Anesthesia Type: General Level of consciousness: awake and confused (At preop baseline) Pain management: pain level controlled Vital Signs Assessment: post-procedure vital signs reviewed and stable Respiratory status: spontaneous breathing, nonlabored ventilation and respiratory function stable Cardiovascular status: blood pressure returned to baseline, stable and tachycardic Postop Assessment: no apparent nausea or vomiting Anesthetic complications: no    Last Vitals:  Vitals:   03/17/19 1300 03/17/19 1330  BP:    Pulse:  (!) 111  Resp:  14  Temp: (!) 36.2 C   SpO2:  97%    Last Pain:  Vitals:   03/17/19 0755  TempSrc: Axillary  PainSc:                  Audry Pili

## 2019-03-17 NOTE — Progress Notes (Addendum)
PROGRESS NOTE  Debra Hopkins [id number] DOB: May 15, 1940 DOA: 03/15/2019 PCP: Monico Blitz, MD   LOS: 2 days   Brief narrative: As per HPI, Debra Hopkins is a 79 y.o. female with medical history significant of history of CAD, diabetes mellitus, TIA, left CEA 2008, fibromyalgia, macular degeneration, peripheral vascular disease with multiple revascularization procedures and maintained on low-dose aspirin presented on 02/15/2019 with chronic RLE ischemic changes and toe ulceration. Recent aortogram showed aortic and iliac segments heavily calcified. 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 [name] on 02/15/2019. Noted postoperatively mottling of the right foot started on heparin drip. 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 Donnetta Hutching. Hospital course further complicated by acute blood loss anemia patient was transfused 2 units with latest hemoglobin 9.5. Subcutaneous heparin was initiated for DVT prophylaxis 02/23/2019 as well as remaining on low-dose aspirin. Vancomycin resumed 03/01/2023 wound coverage.  Patient admitted to acute rehab where she  developed fever chills and was transferred to medical service for sepsis.  Patient is being followed by vascular surgery with a wound VAC and dressing.  Antibiotics was broadened and she was transferred back to acute care for treatment of sepsis probably secondary to infected right groin wound.  Assessment/Plan:  Principal Problem:   Sepsis ([place]) Active Problems:   Mixed hyperlipidemia   Essential hypertension, benign   Coronary atherosclerosis   PVD   Hypothyroidism   GERD (gastroesophageal reflux disease)   Type 2 diabetes mellitus with stage 3 chronic  kidney disease, without long-term current use of insulin (HCC)   S/P AKA (above knee amputation) unilateral, right ([place])  Sepsis secondary to right groin wound infection.  Continue IV antibiotics with vancomycin and cefepime.  Vascular surgery on board.  Blood cultures negative so far.  Wound culture with gram variable rods and gram-positive cocci- klebsiella.  Plan for debridement as per vascular surgery today.  Status post  right AKA with debridement of the right groin with a sartorius muscle flap in the past.  For further debridement today.  History of coronary artery disease.  No acute issues.    Chest pain.  History of GERD- continue PPI  Hypothyroidism -continue Synthroid  Essential hypertension.  Accelerated today likely secondary to n.p.o. status.  On  clonidine, hydralazine, losartan.  Monitor renal function closely.  Peripheral vascular disease status post right BKA.  Vascular surgery on board.  Pancytopenia, neutropenia.    Possibly from sepsis.  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  Will closely monitor.  Continue broad-spectrum antibiotic at this time.  No Evidence of bleeding.  WBC of 1.4 today, was 2.4,- 4 days ago and 4.5 - 6 days ago.  Hemoglobin of 9.6.  Platelets of 65 from 110- 6 days back.  Will need to closely monitor.  Patient follows up with Dr Lavone Neri, [facility]. Was on chemo in the past but her blood counts got worse to see you subsequently given antibody treatment as per the patient's son.  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  Patient does have history of recurrent abscesses and infections in the past.  Spoke with  hematooncologist Dr. Lorenso Courier regarding consultation.  Concern for difficulty swallowing as per nursing staff.  Check speech and swallow after surgical debridement.  CKD stage IIIa.  We will continue  to monitor BMP.   VTE Prophylaxis: Holding lovenox for surgery  Code Status:  Full  Family Communication:  I spoke with the patient's son and updated him about the clinical condition of the patient and spoke about her hematological condition.  Disposition Plan: rehab, pending surgical debridement today.  Will monitor closely.  Follow hematology recommendation.  Consultants:  Vascular surgery   hematology  Procedures:  Plan for surgical debridement today   Antibiotics:   Anti-infectives (From admission, onward)   Start     Dose/Rate Route Frequency Ordered Stop   03/17/19 2200  vancomycin (VANCOREADY) IVPB 1750 mg/350 mL     1,750 mg 175 mL/hr over 120 Minutes Intravenous Every 48 hours 03/16/19 0034     03/16/19 1000  ceFEPIme (MAXIPIME) 2 g in sodium chloride 0.9 % 100 mL IVPB     2 g 200 mL/hr over 30 Minutes Intravenous 2 times daily 03/16/19 0034       Subjective:  Today, patient feels okay, denies overt pain.  Denies nausea, vomiting or shortness of breath.  Waiting for surgical debridement  Objective: Vitals:   03/16/19 2323 03/17/19 0315  BP:    Pulse:    Resp:    Temp: 98.9 F (37.2 C) 98.1 F (36.7 C)  SpO2:      Intake/Output Summary (Last 24 hours) at 03/17/2019 0716 Last data filed at 03/17/2019 0603 Gross per 24 hour  Intake 500 ml  Output 800 ml  Net -300 ml   Filed Weights   03/16/19 0300  Weight: 77.1 kg   Body mass index is 30.11 kg/m.   Physical Exam: GENERAL: Patient is alert awake and communicative. Not in obvious distress. HENT: No scleral pallor or icterus. Pupils equally reactive to light. Oral mucosa is moist NECK: is supple, no palpable thyroid enlargement. CHEST: Clear to auscultation. No crackles or wheezes. Non tender on palpation. Diminished breath sounds bilaterally. CVS: S1 and S2 heard, no murmur. Regular rate and rhythm. No pericardial rub. ABDOMEN: Soft, non-tender, bowel sounds are present. EXTREMITIES:  Right groin  wound with packing.  Status post right above knee amputation. CNS: Cranial nerves are intact. No focal motor or sensory deficits. SKIN: warm and dry, right groin ulcer, status post right  above knee amputation.  Data Review: I have personally reviewed the following laboratory data and studies,  CBC: Recent Labs  Lab 03/12/19 0357 03/13/19 0439 03/14/19 0327 03/15/19 0544 03/15/19 2300 03/16/19 0508  WBC 3.3* 2.4* 1.6* 1.8* 1.5* 1.7*  NEUTROABS 1.9 1.3* 0.8* 1.3* 0.8*  --   HGB 7.6* 7.4* 6.9* 8.8* 8.9* 9.2*  HCT 23.1* 23.3* 21.6* 26.5* 27.6* 27.9*  MCV 92.8 95.1 96.4 92.0 93.6 91.2  PLT 95* 88* 79* 74* 72* 75*   Basic Metabolic Panel: Recent Labs  Lab 03/12/19 0357 03/13/19 0439 03/14/19 0327 03/15/19 0544 03/16/19 0508  NA 137 143 142 144 144  K 4.0 4.6 4.3 4.0 4.0  CL 101 107 109 110 112*  CO2 26 26 24 25  21*  GLUCOSE 198* 182* 198* 170* 148*  BUN 75* 69* 66* 53* 41*  CREATININE 1.75* 1.32* 1.14* 1.06* 0.98  CALCIUM 8.7* 8.8* 8.9 8.9 9.3   Liver Function Tests: Recent Labs  Lab 03/12/19 0357 03/15/19 0544 03/16/19 0508  AST 21 23 17   ALT 12 15  12  ALKPHOS 62 60 62  BILITOT 0.4 0.4 0.5  PROT 4.8* 4.5* 4.9*  ALBUMIN 1.9* 1.6* 1.8*   No results for input(s): LIPASE, AMYLASE in the last 168 hours. No results for input(s): AMMONIA in the last 168 hours. Cardiac Enzymes: No results for input(s): CKTOTAL, CKMB, CKMBINDEX, TROPONINI in the last 168 hours. BNP (last 3 results) No results for input(s): BNP in the last 8760 hours.  ProBNP (last 3 results) No results for input(s): PROBNP in the last 8760 hours.  CBG: Recent Labs  Lab 03/15/19 2307 03/16/19 0059 03/16/19 0928 03/16/19 1212 03/16/19 2032  GLUCAP 192* 164* 111* 123* 132*   Recent Results (from the past 240 hour(s))  Culture, Urine     Status: None   Collection Time: 03/14/19  2:53 PM   Specimen: Urine, Catheterized  Result Value Ref Range Status   Specimen Description URINE, CATHETERIZED   Final   Special Requests Normal  Final   Culture   Final    NO GROWTH Performed at [facility], [address] [place], [place] [address]    Report Status 03/16/2019 FINAL  Final  Aerobic Culture (superficial specimen)     Status: None (Preliminary result)   Collection Time: 03/15/19  4:05 PM   Specimen: Groin  Result Value Ref Range Status   Specimen Description GROIN RIGHT  Final   Special Requests NONE  Final   Gram Stain   Final    NO WBC SEEN MODERATE GRAM VARIABLE ROD RARE GRAM POSITIVE COCCI IN PAIRS    Culture   Final    ABUNDANT KLEBSIELLA OXYTOCA CULTURE REINCUBATED FOR BETTER GROWTH SUSCEPTIBILITIES TO FOLLOW Performed at [facility], [place] [address] [place], [place] [address]    Report Status PENDING  Incomplete  Blood culture , two sites.     Status: None (Preliminary result)   Collection Time: 03/15/19  6:42 PM   Specimen: BLOOD  Result Value Ref Range Status   Specimen Description BLOOD LEFT ANTECUBITAL  Final   Special Requests   Final    BOTTLES DRAWN AEROBIC AND ANAEROBIC Blood Culture adequate volume   Culture   Final    NO GROWTH < 24 HOURS Performed at [facility], [place] [address], [place], [place] [address]    Report Status PENDING  Incomplete  Culture, blood (routine x 2)     Status: None (Preliminary result)   Collection Time: 03/15/19  6:55 PM   Specimen: BLOOD  Result Value Ref Range Status   Specimen Description BLOOD LEFT ANTECUBITAL  Final   Special Requests   Final    BOTTLES DRAWN AEROBIC ONLY Blood Culture adequate volume   Culture   Final    NO GROWTH < 24 HOURS Performed at [facility], [place] [address], [place], [place] [address]    Report Status PENDING  Incomplete     Studies: CT ABDOMEN PELVIS W CONTRAST  Result Date: 03/15/2019 CLINICAL DATA:  79 year old female status post right above knee amputation and muscle flap to the right groin incision. Concern for cellulitis/soft tissue infection. EXAM:  CT ABDOMEN AND PELVIS WITH CONTRAST TECHNIQUE: Multidetector CT imaging of the abdomen and pelvis was performed using the standard protocol following bolus administration of intravenous contrast. CONTRAST:  150mL OMNIPAQUE IOHEXOL 300 MG/ML  SOLN COMPARISON:  CT of the abdomen pelvis dated 09/05/2018. FINDINGS: Lower chest: Partially visualized small bilateral pleural effusions with associated partial compressive atelectasis of the lung bases.  Borderline cardiomegaly. Coronary vascular calcifications noted. No intra-abdominal free air or free fluid. Hepatobiliary: The liver is unremarkable. No intrahepatic biliary ductal dilatation. Minimal layering gallstones. No pericholecystic fluid or evidence of acute cholecystitis by CT. Pancreas: Unremarkable. No pancreatic ductal dilatation or surrounding inflammatory changes. Spleen: Top-normal spleen measuring up to 13 cm in length. Adrenals/Urinary Tract: The adrenal glands are unremarkable. There is no hydronephrosis on either side. Subcentimeter left renal inferior pole hypodense focus is too small to characterize. There is symmetric enhancement and excretion of contrast by both kidneys. The visualized ureters and urinary bladder appear unremarkable. Stomach/Bowel: There is moderate stool throughout the colon. There is no bowel obstruction or active inflammation. There is sigmoid diverticulosis without active inflammatory changes. Appendectomy. Vascular/Lymphatic: Advanced aortoiliac atherosclerotic disease. The IVC is unremarkable. No portal venous gas. There is no adenopathy. Severe atherosclerotic calcification of the superficial femoral arteries bilaterally most severely involving the right lower extremity. There is associated high-grade luminal narrowing and non opacification of the SFA's bilaterally. Reproductive: Hysterectomy. No adnexal masses. Other: There is postsurgical changes of right above knee amputation. There is open wound in the right groin and small  pockets of air in the superficial soft tissues of the right groin. There is diffuse edema of the subcutaneous soft tissues of the right hip and right lower extremity which may represent cellulitis. Clinical correlation is recommended. No drainable fluid collection or abscess. Musculoskeletal: Above knee amputation of the right lower extremity. The edge of the right femoral stump appears sharp and smooth. There is some callus formation adjacent to the distal end of the right femoral stump. No periosteal elevation or bone erosion. There is multilevel degenerative changes of the spine. No acute fracture. IMPRESSION: 1. Postsurgical changes of right above knee amputation. There is an open wound in the right groin and small pockets of air in the superficial soft tissues of the right groin, likely postsurgical. No drainable fluid collection or abscess. 2. Diffuse edema of the subcutaneous soft tissues of the right hip and right lower extremity may represent cellulitis. Clinical correlation is recommended. 3. No bowel obstruction or active inflammation. Normal appendix. 4. Sigmoid diverticulosis. 5. Small bilateral pleural effusions with partial compressive atelectasis of the lung bases. 6. Aortic Atherosclerosis (ICD10-I70.0). Electronically Signed   By: Anner Crete M.D.   On: 03/15/2019 22:56   CT FEMUR RIGHT W CONTRAST  Result Date: 03/15/2019 CLINICAL DATA:  79 year old female status post right above knee amputation and muscle flap to the right groin incision. Concern for cellulitis/soft tissue infection. EXAM: CT ABDOMEN AND PELVIS WITH CONTRAST TECHNIQUE: Multidetector CT imaging of the abdomen and pelvis was performed using the standard protocol following bolus administration of intravenous contrast. CONTRAST:  161mL OMNIPAQUE IOHEXOL 300 MG/ML  SOLN COMPARISON:  CT of the abdomen pelvis dated 09/05/2018. FINDINGS: Lower chest: Partially visualized small bilateral pleural effusions with associated partial  compressive atelectasis of the lung bases. Borderline cardiomegaly. Coronary vascular calcifications noted. No intra-abdominal free air or free fluid. Hepatobiliary: The liver is unremarkable. No intrahepatic biliary ductal dilatation. Minimal layering gallstones. No pericholecystic fluid or evidence of acute cholecystitis by CT. Pancreas: Unremarkable. No pancreatic ductal dilatation or surrounding inflammatory changes. Spleen: Top-normal spleen measuring up to 13 cm in length. Adrenals/Urinary Tract: The adrenal glands are unremarkable. There is no hydronephrosis on either side. Subcentimeter left renal inferior pole hypodense focus is too small to characterize. There is symmetric enhancement and excretion of contrast by both kidneys. The visualized ureters and urinary bladder appear unremarkable.  Stomach/Bowel: There is moderate stool throughout the colon. There is no bowel obstruction or active inflammation. There is sigmoid diverticulosis without active inflammatory changes. Appendectomy. Vascular/Lymphatic: Advanced aortoiliac atherosclerotic disease. The IVC is unremarkable. No portal venous gas. There is no adenopathy. Severe atherosclerotic calcification of the superficial femoral arteries bilaterally most severely involving the right lower extremity. There is associated high-grade luminal narrowing and non opacification of the SFA's bilaterally. Reproductive: Hysterectomy. No adnexal masses. Other: There is postsurgical changes of right above knee amputation. There is open wound in the right groin and small pockets of air in the superficial soft tissues of the right groin. There is diffuse edema of the subcutaneous soft tissues of the right hip and right lower extremity which may represent cellulitis. Clinical correlation is recommended. No drainable fluid collection or abscess. Musculoskeletal: Above knee amputation of the right lower extremity. The edge of the right femoral stump appears sharp and  smooth. There is some callus formation adjacent to the distal end of the right femoral stump. No periosteal elevation or bone erosion. There is multilevel degenerative changes of the spine. No acute fracture. IMPRESSION: 1. Postsurgical changes of right above knee amputation. There is an open wound in the right groin and small pockets of air in the superficial soft tissues of the right groin, likely postsurgical. No drainable fluid collection or abscess. 2. Diffuse edema of the subcutaneous soft tissues of the right hip and right lower extremity may represent cellulitis. Clinical correlation is recommended. 3. No bowel obstruction or active inflammation. Normal appendix. 4. Sigmoid diverticulosis. 5. Small bilateral pleural effusions with partial compressive atelectasis of the lung bases. 6. Aortic Atherosclerosis (ICD10-I70.0). Electronically Signed   By: Anner Crete M.D.   On: 03/15/2019 22:56    Scheduled Meds: . aspirin EC  81 mg Oral Daily  . calcium citrate  1 tablet Oral Daily  . Chlorhexidine Gluconate Cloth  6 each Topical Daily  . cholecalciferol  2,000 Units Oral Daily  . cloNIDine  0.2 mg Oral QHS  . collagenase  1 application Topical Daily  . enoxaparin (LOVENOX) injection  40 mg Subcutaneous Daily  . hydrALAZINE  25 mg Oral QHS  . insulin aspart  0-15 Units Subcutaneous TID WC  . insulin aspart  0-5 Units Subcutaneous QHS  . insulin glargine  10 Units Subcutaneous QHS  . levothyroxine  50 mcg Oral QAC breakfast  . losartan  100 mg Oral Daily  . NIFEdipine  30 mg Oral Daily  . omega-3 acid ethyl esters  1 capsule Oral Daily  . pantoprazole  40 mg Oral Daily  . simvastatin  10 mg Oral QODAY  . traZODone  100 mg Oral QHS    Continuous Infusions: . sodium chloride 100 mL/hr at 03/16/19 0100  . ceFEPime (MAXIPIME) IV 2 g (03/16/19 2331)  . vancomycin       Flora Lipps, MD  [facility] 03/17/2019

## 2019-03-17 NOTE — Progress Notes (Signed)
  Progress Note    03/17/2019 10:30 AM Day of Surgery  Subjective: No overnight issues  Vitals:   03/17/19 0315 03/17/19 0755  BP:  (!) 181/66  Pulse:  90  Resp:  (!) 23  Temp: 98.1 F (36.7 C) 99.2 F (37.3 C)  SpO2:  95%    Physical Exam: Awake and alert Nonlabored respirations Right groin dressing is intact  CBC    Component Value Date/Time   WBC 1.7 (L) 03/16/2019 0508   RBC 3.06 (L) 03/16/2019 0508   HGB 9.2 (L) 03/16/2019 0508   HCT 27.9 (L) 03/16/2019 0508   PLT 75 (L) 03/16/2019 0508   MCV 91.2 03/16/2019 0508   MCH 30.1 03/16/2019 0508   MCHC 33.0 03/16/2019 0508   RDW 14.9 03/16/2019 0508   LYMPHSABS 0.4 (L) 03/15/2019 2300   MONOABS 0.3 03/15/2019 2300   EOSABS 0.0 03/15/2019 2300   BASOSABS 0.0 03/15/2019 2300    BMET    Component Value Date/Time   NA 147 (H) 03/17/2019 0818   NA 137 11/04/2018 1132   K 3.5 03/17/2019 0818   CL 118 (H) 03/17/2019 0818   CO2 19 (L) 03/17/2019 0818   GLUCOSE 117 (H) 03/17/2019 0818   BUN 26 (H) 03/17/2019 0818   BUN 24 11/04/2018 1132   CREATININE 0.81 03/17/2019 0818   CALCIUM 9.2 03/17/2019 0818   GFRNONAA >60 03/17/2019 0818   GFRAA >60 03/17/2019 0818    INR    Component Value Date/Time   INR 1.01 12/18/2016 1508     Intake/Output Summary (Last 24 hours) at 03/17/2019 1030 Last data filed at 03/17/2019 0603 Gross per 24 hour  Intake 260 ml  Output 600 ml  Net -340 ml     Assessment/plan:  79 y.o. female is s/p right AKA and debridement right groin with sartorius muscle flap. Now with transfer back to the hospital with sepsis concern for right groin source. Will take to operating room today for further debridement.   Patrisia Faeth C. [name], MD [facility] Office: [phone] Pager: [phone]  03/17/2019 10:30 AM

## 2019-03-17 NOTE — Progress Notes (Signed)
Patient back on unit at this time.  No s/s of distress noted.  Moaning with repositioning at this time.  PACU RN stated she has just received fentanyl.  Will continue to monitor.

## 2019-03-17 NOTE — Anesthesia Preprocedure Evaluation (Addendum)
Anesthesia Evaluation  Patient identified by MRN, date of birth, ID band Patient confused    Reviewed: Allergy & Precautions, H&P , NPO status , Patient's Chart, lab work & pertinent test results  History of Anesthesia Complications (+) POST - OP SPINAL HEADACHE  Airway Mallampati: III  TM Distance: >3 FB Neck ROM: Full    Dental  (+) Dental Advisory Given   Pulmonary shortness of breath, former smoker,    Pulmonary exam normal        Cardiovascular hypertension, Pt. on medications + CAD, + Peripheral Vascular Disease and +CHF  Normal cardiovascular exam   '19 Carotid US - 1-39% right ICAS, no left ICAS    Neuro/Psych TIAnegative psych ROS   GI/Hepatic GERD  Controlled and Medicated, Diverticulosis, hemorrhoids    Endo/Other  diabetes, Type 2, Oral Hypoglycemic Agents, Insulin DependentHypothyroidism  Hypernatremia Hyperchloremia   Renal/GU CRFRenal disease  negative genitourinary   Musculoskeletal  (+) Arthritis , Osteoarthritis,  Fibromyalgia -  Abdominal   Peds  Hematology  (+) anemia ,  Pancytopenia (WBC 1.7, Hct 27.9, Plt 75)    Anesthesia Other Findings HLD  Reproductive/Obstetrics                           Anesthesia Physical  Anesthesia Plan  ASA: III  Anesthesia Plan: General   Post-op Pain Management:    Induction: Intravenous  PONV Risk Score and Plan: 3 and Ondansetron and Treatment may vary due to age or medical condition  Airway Management Planned: LMA  Additional Equipment: None  Intra-op Plan:   Post-operative Plan: Extubation in OR  Informed Consent: I have reviewed the patients History and Physical, chart, labs and discussed the procedure including the risks, benefits and alternatives for the proposed anesthesia with the patient or authorized representative who has indicated his/her understanding and acceptance.     Dental advisory given  Plan  Discussed with: CRNA and Anesthesiologist  Anesthesia Plan Comments:        Anesthesia Quick Evaluation

## 2019-03-17 NOTE — Progress Notes (Signed)
RN requesting restraints

## 2019-03-17 NOTE — Anesthesia Procedure Notes (Signed)
Procedure Name: LMA Insertion Date/Time: 03/17/2019 11:18 AM Performed by: Janene Harvey, CRNA Pre-anesthesia Checklist: Patient identified, Emergency Drugs available, Suction available and Patient being monitored Patient Re-evaluated:Patient Re-evaluated prior to induction Oxygen Delivery Method: Circle system utilized Preoxygenation: Pre-oxygenation with 100% oxygen Induction Type: IV induction Ventilation: Mask ventilation without difficulty LMA: LMA inserted LMA Size: 3.0 Dental Injury: Teeth and Oropharynx as per pre-operative assessment  Comments: Attempted to place LMA 4, unable to seat. Pt easily masked, LMA 3 placed by Fransisco Beau.

## 2019-03-17 NOTE — Op Note (Signed)
    Patient name: Debra Hopkins MRN: [id number] DOB: 04-27-1940 Sex: female  03/17/2019 Pre-operative Diagnosis: Right groin wound necrosis, sepsis Post-operative diagnosis:  Same Surgeon:  Erlene Quan C. [name], MD Assistant: Arlee Muslim, PA Procedure Performed: 1.  Sharp excisional debridement of right groin wound consisting of muscle, skin, subcutaneous tissue to 12 x 11 x 3 cm 2.  Pulsavac irrigation right groin wound total 3 L 3.  Revision of right sartorius muscle flap  Indications: 79 year old female has previously undergone debridement of right groin for necrotic wound.  Sartorius muscle was used as flap coverage over her bypass at the time.  Bypass is vein remains patent despite above-knee amputation ipsilaterally.  She now has concern for sepsis with foul smell from the groin wound and is indicated for further debridement and washout.  Findings: The wound did not have any evidence of gross infection although there was significant necrosis particularly extending laterally.  All muscle in the wound was minimally reactive to cautery.  There was good bleeding in the wound particularly with division of the sartorius muscle flap.  This was debrided back to healthy-appearing tissue.  We then tacked this medially overlying our vein bypass graft.  A Betadine soaked wet-to-dry dressing was placed.  We will reevaluate wound for wound VAC placement at the bedside.   Procedure:  The patient was identified in the holding area and taken to the operating room where she is placed by operative general anesthesia induced.  She was sterilely prepped draped in the right groin usual fashion antibiotics were up-to-date timeout was called.  We began with Pulsavac irrigation of the total of 3 L.  We then evaluated the wound.  I used scissors and cautery to sharply excise tissue laterally that was grossly necrotic.  The muscle flap had dehisced medially.  I took down all the medial attachments thoroughly irrigated the  medial aspect of the wound.  The vein graft was in the wound bed was noted to have pulsatility.  I then bluntly took down the sartorius muscle flap cephalad.  I debrided this back to healthy tissue and then tacked it cephalad and medially with interrupted 2-0 Vicryl suture.  We obtain hemostasis of the wound again irrigated and placed a Betadine soaked Kerlix in the wound.  The entire Kerlix did fit.  A dry dressing was then placed above this.  She was awake from anesthesia having tolerated procedure without immediate complication.  Counts were correct at completion.  EBL: 100 cc    Gailya Tauer C. [name], MD [facility] Office: [phone] Pager: [phone]

## 2019-03-17 NOTE — Progress Notes (Signed)
Physical Therapy Wound Treatment Patient Details  Name: Debra Hopkins MRN: [id number] Date of Birth: [birth year]  Today's Date: 03/17/2019 Time: 1400-1440 Time Calculation (min): 40 min  Subjective  Subjective: Pt alert but appears flat. Restraints in place. Pt singing softly to herself. Patient and Family Stated Goals: None stated this session.  Prior Treatments: Off loading and dressing changes by nursing staff  Pain Score:  Pt premedicated, appeared to be in mild pain during debridement, however overall tolerated well and was singing to herself throughout session.   Wound Assessment  Pressure Injury 03/08/19 Coccyx Right;Left;Medial Unstageable - Full thickness tissue loss in which the base of the injury is covered by slough (yellow, tan, gray, green or brown) and/or eschar (tan, brown or black) in the wound bed. yellow, black and gr (Active)  Dressing Type ABD;Barrier Film (skin prep);Gauze (Comment);Moist to dry 03/17/19 1453  Dressing Clean;Dry;Intact;Changed 03/17/19 1453  Dressing Change Frequency Daily 03/17/19 1453  State of Healing Non-healing 03/17/19 1453  Site / Wound Assessment Red;Yellow;Black 03/17/19 1453  % Wound base Red or Granulating 5% 03/17/19 1453  % Wound base Yellow/Fibrinous Exudate 45% 03/17/19 1453  % Wound base Black/Eschar 50% 03/17/19 1453  % Wound base Other/Granulation Tissue (Comment) 0% 03/17/19 1453  Peri-wound Assessment Pink 03/17/19 1453  Wound Length (cm) 5.2 cm 03/16/19 1400  Wound Width (cm) 4.5 cm 03/16/19 1400  Wound Depth (cm) 0.1 cm 03/16/19 1400  Wound Surface Area (cm^2) 23.4 cm^2 03/16/19 1400  Wound Volume (cm^3) 2.34 cm^3 03/16/19 1400  Margins Unattached edges (unapproximated) 03/17/19 1453  Drainage Amount Minimal 03/17/19 1453  Drainage Description Serosanguineous 03/17/19 1453  Treatment Debridement (Selective);Hydrotherapy (Pulse lavage);Packing (Saline gauze) 03/17/19 1453   Santyl applied to wound bed prior to applying  dressing.     Hydrotherapy Pulsed lavage therapy - wound location: Sacrum Pulsed Lavage with Suction (psi): 8 psi Pulsed Lavage with Suction - Normal Saline Used: 1000 mL Pulsed Lavage Tip: Tip with splash shield Selective Debridement Selective Debridement - Location: Sacrum Selective Debridement - Tools Used: Forceps;Scalpel Selective Debridement - Tissue Removed: Black and yellow unviable tissue   Wound Assessment and Plan  Wound Therapy - Assess/Plan/Recommendations Wound Therapy - Clinical Statement: Necrotic tissue beginning to soften and debridement was more successful today. This patient will benefit from continued hydrotherapy for selective removal of unviable tissue, to decrease bioburden and promote wound bed healing.  Wound Therapy - Functional Problem List: Global weakness in the setting of multiple wounds, R AKA, and gross debility from prolonged hospital stay.  Factors Delaying/Impairing Wound Healing: Diabetes Mellitus;Immobility;Multiple medical problems Hydrotherapy Plan: Debridement;Dressing change;Pulsatile lavage with suction;Patient/family education Wound Therapy - Frequency: 6X / week Wound Therapy - Follow Up Recommendations: Other (comment)(CIR vs SNF) Wound Plan: See above  Wound Therapy Goals- Improve the function of patient's integumentary system by progressing the wound(s) through the phases of wound healing (inflammation - proliferation - remodeling) by: Decrease Necrotic Tissue to: 20% Decrease Necrotic Tissue - Progress: Progressing toward goal Increase Granulation Tissue to: 75% Increase Granulation Tissue - Progress: Progressing toward goal Improve Drainage Characteristics: Min;Serous Improve Drainage Characteristics - Progress: Progressing toward goal Goals/treatment plan/discharge plan were made with and agreed upon by patient/family: Yes Time For Goal Achievement: 7 days Wound Therapy - Potential for Goals: Good  Goals will be updated until  maximal potential achieved or discharge criteria met.  Discharge criteria: when goals achieved, discharge from hospital, MD decision/surgical intervention, no progress towards goals, refusal/missing three consecutive treatments without notification or medical reason.  GP     [name] 03/17/2019, 3:04 PM   Rolinda Roan, PT, DPT Acute Rehabilitation Services Pager: [phone] Office: [phone]

## 2019-03-17 NOTE — Progress Notes (Signed)
Lab called critical WBC 1.4

## 2019-03-17 NOTE — Evaluation (Signed)
Clinical/Bedside Swallow Evaluation Patient Details  Name: Debra Hopkins MRN: [id number] Date of Birth: April 07, 1940  Today's Date: 03/17/2019 Time: SLP Start Time (ACUTE ONLY): [id number] SLP Stop Time (ACUTE ONLY): 1610 SLP Time Calculation (min) (ACUTE ONLY): 19 min  Past Medical History:  Past Medical History:  Diagnosis Date  . Arthritis   . CAD (coronary artery disease)   . Cancer ([place])   . Carotid artery occlusion   . Cataracts, bilateral   . DDD (degenerative disc disease)   . Diabetes mellitus    Type 2  . Diverticulitis   . Dyspnea   . Fibromyalgia   . GERD (gastroesophageal reflux disease)   . Heart murmur   . Hemorrhoids   . History of shingles   . Impaired hearing   . Macular degeneration   . Other and unspecified hyperlipidemia   . PVD (peripheral vascular disease) ([place])   . TIA (transient ischemic attack)   . Unspecified essential hypertension   . Varicose veins    Past Surgical History:  Past Surgical History:  Procedure Laterality Date  . ABDOMINAL AORTOGRAM W/LOWER EXTREMITY Bilateral 02/15/2019   Procedure: ABDOMINAL AORTOGRAM W/LOWER EXTREMITY;  Surgeon: Waynetta Sandy, MD;  Location: [place] [facility];  Service: Cardiovascular;  Laterality: Bilateral;  . AMPUTATION Right 02/22/2019   Procedure: RIGHT ABOVE KNEE AMPUTATION;  Surgeon: Rosetta Posner, MD;  Location: [place];  Service: Vascular;  Laterality: Right;  . ANGIOPLASTY     left leg 2003  . APPENDECTOMY    . APPLICATION OF WOUND VAC Right 03/09/2019   Procedure: APPLICATION OF WOUND VAC RIGHT GROIN;  Surgeon: Waynetta Sandy, MD;  Location: [place];  Service: Vascular;  Laterality: Right;  . aspiration of a cyst in the right breast    . BALLOON DILATION N/A 10/01/2013   Procedure: BALLOON DILATION;  Surgeon: Rogene Houston, MD;  [facility];  Service: Endoscopy;  Laterality: N/A;  . BREAST BIOPSY    . BREAST BIOPSY Left   . CARDIAC CATHETERIZATION    . CAROTID  ENDARTERECTOMY  12-03-06   left CEA  . CATARACT EXTRACTION W/PHACO Right 10/14/2013   Procedure: CATARACT EXTRACTION PHACO AND INTRAOCULAR LENS PLACEMENT (IOC);  Surgeon: Tonny Branch, MD;  [facility]: [facility];  Service: Ophthalmology;  Laterality: Right;  CDE 7.20  . CATARACT EXTRACTION W/PHACO Left 11/04/2013   Procedure: CATARACT EXTRACTION PHACO AND INTRAOCULAR LENS PLACEMENT (IOC);  Surgeon: Tonny Branch, MD;  [facility]: [facility];  Service: Ophthalmology;  Laterality: Left;  CDE 8.57  . COLONOSCOPY N/A 09/19/2014   Procedure: COLONOSCOPY;  Surgeon: Rogene Houston, MD;  [facility];  Service: Endoscopy;  Laterality: N/A;  7:30-8:30  . CYSTO WITH HYDRODISTENSION N/A 03/29/2016   Procedure: CYSTOSCOPY/HYDRODISTENSION;  Surgeon: Irine Seal, MD;  [facility];  Service: Urology;  Laterality: N/A;  . CYSTOSCOPY WITH INJECTION N/A 03/29/2016   Procedure: CYSTOSCOPY WITH PYRIDIUM AND MARCAINE INSTALLATION;  Surgeon: Irine Seal, MD;  [facility];  Service: Urology;  Laterality: N/A;  . ESOPHAGOGASTRODUODENOSCOPY N/A 10/01/2013   Procedure: ESOPHAGOGASTRODUODENOSCOPY (EGD);  Surgeon: Rogene Houston, MD;  [facility];  Service: Endoscopy;  Laterality: N/A;  120  . ESOPHAGOGASTRODUODENOSCOPY N/A 10/24/2014   Procedure: ESOPHAGOGASTRODUODENOSCOPY (EGD);  Surgeon: Rogene Houston, MD;  [facility];  Service: Endoscopy;  Laterality: N/A;  730  . FALSE ANEURYSM REPAIR Left 02/16/2019   Procedure: REPAIR LEFT FEMORAL PSEUDOANEURYSM;  Surgeon: Waynetta Sandy, MD;  [facility];  Service: Vascular;  Laterality: Left;  . FEMORAL-POPLITEAL BYPASS GRAFT Right 02/16/2019   Procedure: RIGHT BYPASS GRAFT FEMORAL-POPLITEAL ARTERY USING NON-REVERSED RIGHT GREATER SAPHENOUS VEIN;  Surgeon: Waynetta Sandy, MD;  Location: [place];  Service: Vascular;  Laterality: Right;  . GIVENS CAPSULE STUDY N/A 10/24/2014   Procedure: GIVENS CAPSULE STUDY;  Surgeon: Rogene Houston, MD;   [facility];  Service: Endoscopy;  Laterality: N/A;  . JOINT REPLACEMENT  2011   Right total knee replacement  . [place]  . KNEE ARTHROSCOPY  2009  . LOWER EXTREMITY ANGIOGRAM Right 02/16/2019   Procedure: Lower Extremity Angiogram;  Surgeon: Waynetta Sandy, MD;  Location: [place];  Service: Vascular;  Laterality: Right;  . MALONEY DILATION N/A 10/01/2013   Procedure: Venia Minks DILATION;  Surgeon: Rogene Houston, MD;  [facility];  Service: Endoscopy;  Laterality: N/A;  . PERIPHERAL VASCULAR INTERVENTION Right 02/15/2019   Procedure: PERIPHERAL VASCULAR INTERVENTION;  Surgeon: Waynetta Sandy, MD;  Location: [place] [facility];  Service: Cardiovascular;  Laterality: Right;  external iliac, SFA, popliteal  . SAVORY DILATION N/A 10/01/2013   Procedure: SAVORY DILATION;  Surgeon: Rogene Houston, MD;  [facility];  Service: Endoscopy;  Laterality: N/A;  . SPINE SURGERY  1997   C5-c6 fusion  . TOTAL ABDOMINAL HYSTERECTOMY  1973  . 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 The patient was sent from the acute rehabilitation center secondary to sepsis. 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   Assessment / Plan / Recommendation Clinical Impression  Debra Hopkins encountered with  diffuse dried blood in oral cavity- source possibly sublingual (?) and able to remove majority. Verbalized only when requested in normal vocal quailty and appeared to have delayed processing or effects of meds for debridement today. There were no indications of decreased airway protection but with current cognitive status and need for continued oral care, recommend initiate full liquid diet with good prognosis to upgrade soon.   SLP Visit Diagnosis: Dysphagia, unspecified (R13.10)    Aspiration Risk  Mild aspiration risk    Diet Recommendation Thin liquid;Other (Comment)(full liquids)   Liquid Administration via: Cup;Straw Medication Administration: Whole meds with puree Supervision: Patient able to self feed Compensations: Slow rate;Small sips/bites Postural Changes: Seated upright at 90 degrees    Other  Recommendations Oral Care Recommendations: Oral care BID   Follow up Recommendations None      Frequency and Duration min 1 x/week  2 weeks       Prognosis Prognosis for Safe Diet Advancement: Good      Swallow Study   General HPI: Ms. 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 The patient was sent from the acute rehabilitation center secondary to sepsis. Per chart  patient initially presented 02/15/2019 with chronic right lower extremity ischemic changes and toe ulceration and has undergon stenting of her right SFA, popliteal arteries on 02/15/2019, right common femoral enterectomy with open repair of left common femoral pseudoaneurysm on 02/16/2019, underwent a right AKA on 12/14/720 . Type of Study: Bedside Swallow Evaluation Previous Swallow Assessment: (none) Diet Prior to this Study: NPO(npo for surgery) Temperature Spikes Noted: No Respiratory Status: Room air History of Recent Intubation:  No Behavior/Cognition: Alert;Cooperative;Requires cueing Oral Cavity Assessment: Other (comment)(dried blood diffuse) Oral Care Completed by SLP: Yes Oral Cavity - Dentition: Adequate natural dentition Vision: Functional for self-feeding Self-Feeding Abilities: Able to feed self;Needs assist Patient Positioning: Upright in bed Baseline Vocal Quality: Normal Volitional Cough: Weak Volitional Swallow: Able to elicit    Oral/Motor/Sensory Function Overall Oral Motor/Sensory Function: Generalized oral weakness   Ice Chips     Thin Liquid Thin Liquid: Within functional limits Presentation: Cup;Straw    Nectar Thick Nectar Thick Liquid: Not tested   Honey Thick Honey Thick Liquid: Not tested   Puree Puree: Within functional limits   Solid     Solid: Not tested      [place] Siren 03/17/2019,4:47 PM  Orbie Pyo Colvin Caroli.Ed Risk analyst [phone] Office [phone]

## 2019-03-17 NOTE — Progress Notes (Signed)
Patient removed restraints on left hand. Small red bruise noted on dorsal wrist area from friction in removal of restraint. Restraint reapplied, complete bed change and patient repositioned.

## 2019-03-17 NOTE — Progress Notes (Signed)
SLP Cancellation Note  Patient Details Name: Debra Hopkins MRN: [id number] DOB: 26-Feb-1941   Cancelled treatment:        Pt having debridement of right groin. Will see as soon as able for swallow eval.    Houston Siren 03/17/2019, 10:27 AM   Orbie Pyo Colvin Caroli.Ed Risk analyst [phone] Office [phone]

## 2019-03-17 NOTE — Consult Note (Addendum)
[place]  [phone] [phone]    [place]  Referring MD:  Dr. Flora Lipps  Reason for Referral: Pancytopenia  HPI: Ms. 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  The patient was sent from the acute rehabilitation center secondary to sepsis.  The patient initially presented to the hospital on 02/15/2019 with chronic right lower extremity ischemic changes and toe ulceration and she underwent stenting of her right SFA and popliteal arteries on 02/15/2019.  She had mottling of the right foot postoperatively and underwent a right common femoral enterectomy with open repair of left common femoral pseudoaneurysm on 02/16/2019.  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 Donnetta Hutching.  During this hospitalization, she also had acute blood loss anemia and received 2 unit PRBCs.  She was discharged to acute rehab and developed fevers and chills.  SHe was brought back to the hospital and started on IV antibiotics.  Admission, her white blood cell count was 1.7, hemoglobin 9.2, and platelet count 75,000.  She has been followed by Dr. Silvestre Mesi at [facility].  She was last seen by him on 01/21/2019.  She has a diagnosis of JAK2 positive myeloproliferative disorder.  It appears though she received 1 cycle of a Vidaza in January 2020 but subsequent cycles were held secondary to cytopenias.  Due to her poor performance status, it appears though she did not receive any additional chemotherapy.  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  When seen today, she just returned from the OR for sharp excisional debridement of the right groin wound and revision of the right sartorius muscle flap.  She is very sedated at the time my visit.  She was unable to wake up and answer my questions.  Hematology was asked see the patient to make recommendations regarding her pancytopenia.  Past Medical History:  Diagnosis Date   Arthritis    CAD (coronary artery disease)    Cancer ([place])    Carotid artery occlusion    Cataracts, bilateral    DDD (degenerative disc disease)    Diabetes mellitus    Type 2   Diverticulitis    Dyspnea    Fibromyalgia    GERD (gastroesophageal reflux disease)    Heart murmur    Hemorrhoids    History of shingles    Impaired hearing    Macular degeneration    Other and unspecified hyperlipidemia    PVD (peripheral vascular disease) (HCC)    TIA (transient ischemic attack)    Unspecified essential hypertension    Varicose veins   :    Past Surgical History:  Procedure Laterality Date   ABDOMINAL AORTOGRAM W/LOWER EXTREMITY Bilateral 02/15/2019   Procedure: ABDOMINAL AORTOGRAM W/LOWER EXTREMITY;  Surgeon: Waynetta Sandy, MD;  Location: [place] [facility];  Service: Cardiovascular;  Laterality: Bilateral;   AMPUTATION Right 02/22/2019   Procedure: RIGHT ABOVE KNEE AMPUTATION;  Surgeon: Rosetta Posner, MD;  Location: [place];  Service: Vascular;  Laterality: Right;   ANGIOPLASTY     left leg 2003   APPENDECTOMY     APPLICATION OF WOUND VAC Right 03/09/2019   Procedure:  APPLICATION OF WOUND VAC RIGHT GROIN;  Surgeon: Waynetta Sandy, MD;  Location: [place];  Service: Vascular;  Laterality: Right;   aspiration of a cyst in the right breast     BALLOON DILATION N/A 10/01/2013   Procedure: BALLOON DILATION;  Surgeon: Rogene Houston, MD;  [facility];  Service: Endoscopy;  Laterality: N/A;   BREAST BIOPSY     BREAST BIOPSY Left    CARDIAC CATHETERIZATION      CAROTID ENDARTERECTOMY  12-03-06   left CEA   CATARACT EXTRACTION W/PHACO Right 10/14/2013   Procedure: CATARACT EXTRACTION PHACO AND INTRAOCULAR LENS PLACEMENT (IOC);  Surgeon: Tonny Branch, MD;  Location: [facility];  Service: Ophthalmology;  Laterality: Right;  CDE 7.20   CATARACT EXTRACTION W/PHACO Left 11/04/2013   Procedure: CATARACT EXTRACTION PHACO AND INTRAOCULAR LENS PLACEMENT (IOC);  Surgeon: Tonny Branch, MD;  Location: [facility];  Service: Ophthalmology;  Laterality: Left;  CDE 8.57   COLONOSCOPY N/A 09/19/2014   Procedure: COLONOSCOPY;  Surgeon: Rogene Houston, MD;  [facility];  Service: Endoscopy;  Laterality: N/A;  7:30-8:30   CYSTO WITH HYDRODISTENSION N/A 03/29/2016   Procedure: CYSTOSCOPY/HYDRODISTENSION;  Surgeon: Irine Seal, MD;  [facility]: [facility];  Service: Urology;  Laterality: N/A;   CYSTOSCOPY WITH INJECTION N/A 03/29/2016   Procedure: CYSTOSCOPY WITH PYRIDIUM AND MARCAINE INSTALLATION;  Surgeon: Irine Seal, MD;  [facility];  Service: Urology;  Laterality: N/A;   ESOPHAGOGASTRODUODENOSCOPY N/A 10/01/2013   Procedure: ESOPHAGOGASTRODUODENOSCOPY (EGD);  Surgeon: Rogene Houston, MD;  [facility];  Service: Endoscopy;  Laterality: N/A;  120   ESOPHAGOGASTRODUODENOSCOPY N/A 10/24/2014   Procedure: ESOPHAGOGASTRODUODENOSCOPY (EGD);  Surgeon: Rogene Houston, MD;  [facility];  Service: Endoscopy;  Laterality: N/A;  730   FALSE ANEURYSM REPAIR Left 02/16/2019   Procedure: REPAIR LEFT FEMORAL PSEUDOANEURYSM;  Surgeon: Waynetta Sandy, MD;  Location: [place];  Service: Vascular;  Laterality: Left;   FEMORAL-POPLITEAL BYPASS GRAFT Right 02/16/2019   Procedure: RIGHT BYPASS GRAFT FEMORAL-POPLITEAL ARTERY USING NON-REVERSED RIGHT GREATER SAPHENOUS VEIN;  Surgeon: Waynetta Sandy, MD;  Location: [place];  Service: Vascular;  Laterality: Right;   GIVENS CAPSULE STUDY N/A 10/24/2014   Procedure: GIVENS CAPSULE STUDY;  Surgeon: Rogene Houston, MD;   [facility];  Service: Endoscopy;  Laterality: N/A;   JOINT REPLACEMENT  2011   Right total knee replacement   KIDNEY STONE SURGERY  1988   KNEE ARTHROSCOPY  2009   LOWER EXTREMITY ANGIOGRAM Right 02/16/2019   Procedure: Lower Extremity Angiogram;  Surgeon: Waynetta Sandy, MD;  Location: [place];  Service: Vascular;  Laterality: Right;   MALONEY DILATION N/A 10/01/2013   Procedure: Venia Minks DILATION;  Surgeon: Rogene Houston, MD;  [facility];  Service: Endoscopy;  Laterality: N/A;   PERIPHERAL VASCULAR INTERVENTION Right 02/15/2019   Procedure: PERIPHERAL VASCULAR INTERVENTION;  Surgeon: Waynetta Sandy, MD;  Location: [place] [facility];  Service: Cardiovascular;  Laterality: Right;  external iliac, SFA, popliteal   SAVORY DILATION N/A 10/01/2013   Procedure: SAVORY DILATION;  Surgeon: Rogene Houston, MD;  [facility];  Service: Endoscopy;  Laterality: N/A;   [place]   C5-c6 fusion   TOTAL ABDOMINAL HYSTERECTOMY  1973   WOUND DEBRIDEMENT Right 03/09/2019   Procedure: DEBRIDEMENT WOUND RIGHT GROIN;  Surgeon: Waynetta Sandy, MD;  Location: [place];  Service: Vascular;  Laterality: Right;  :  CURRENT MEDS: Current Facility-Administered Medications  Medication Dose Route Frequency Provider Last Rate Last Admin   0.9 %  sodium chloride infusion   Intravenous Continuous Dagoberto Ligas, PA-C 10 mL/hr at 03/17/19 1107 Rate Change at 03/17/19 1107   acetaminophen (TYLENOL) tablet 650 mg  650 mg Oral Q6H PRN Dagoberto Ligas, PA-C   650 mg at 03/16/19 [id number]   Or   acetaminophen (TYLENOL) suppository 650 mg  650 mg Rectal Q6H PRN Dagoberto Ligas, PA-C       allopurinol (ZYLOPRIM) tablet 100 mg  100 mg Oral Daily PRN Dagoberto Ligas, PA-C       aspirin EC tablet 81 mg  81 mg Oral Daily Dagoberto Ligas, PA-C       calcium citrate (CALCITRATE - dosed in mg elemental calcium) tablet 200 mg of elemental calcium  1 tablet  Oral Daily Dagoberto Ligas, PA-C   200 mg of elemental calcium at 03/16/19 1820   ceFEPIme (MAXIPIME) 2 g in sodium chloride 0.9 % 100 mL IVPB  2 g Intravenous BID Dagoberto Ligas, PA-C 200 mL/hr at 03/16/19 2331 2 g at 03/17/19 1139   Chlorhexidine Gluconate Cloth 2 % PADS 6 each  6 each Topical Daily Dagoberto Ligas, PA-C   6 each at 03/16/19 1544   cholecalciferol (VITAMIN D3) tablet 2,000 Units  2,000 Units Oral Daily Dagoberto Ligas, PA-C   2,000 Units at 03/16/19 1814   clobetasol cream (TEMOVATE) [id number] % 1 application  1 application Topical Daily PRN Dagoberto Ligas, PA-C       cloNIDine (CATAPRES) tablet 0.2 mg  0.2 mg Oral QHS Dagoberto Ligas, PA-C   0.2 mg at 03/16/19 2122   collagenase (SANTYL) ointment 1 application  1 application Topical Daily Dagoberto Ligas, PA-C   1 application at [id number]   enoxaparin (LOVENOX) injection 40 mg  40 mg Subcutaneous Daily Dagoberto Ligas, PA-C   40 mg at 03/16/19 0840   fentaNYL (SUBLIMAZE) 100 MCG/2ML injection            hydrALAZINE (APRESOLINE) tablet 25 mg  25 mg Oral QHS Dagoberto Ligas, PA-C   25 mg at 03/16/19 2122   insulin aspart (novoLOG) injection 0-15 Units  0-15 Units Subcutaneous TID WC Dagoberto Ligas, PA-C       insulin aspart (novoLOG) injection 0-5 Units  0-5 Units Subcutaneous QHS Dagoberto Ligas, PA-C       insulin glargine (LANTUS) injection 10 Units  10 Units Subcutaneous QHS Dagoberto Ligas, PA-C   10 Units at 03/16/19 2333   ketotifen (ZADITOR) 0.025 % ophthalmic solution 1 drop  1 drop Both Eyes Daily PRN Dagoberto Ligas, PA-C       levothyroxine (SYNTHROID) tablet 50 mcg  50 mcg Oral QAC breakfast Dagoberto Ligas, PA-C   50 mcg at 03/17/19 [id number]   losartan (COZAAR) tablet 100 mg  100 mg Oral Daily Dagoberto Ligas, PA-C       methocarbamol (ROBAXIN) tablet 750 mg  750 mg Oral BID PRN Dagoberto Ligas, PA-C   750 mg at 03/16/19 1820   NIFEdipine (PROCARDIA-XL/NIFEDICAL-XL) 24 hr tablet 30 mg  30 mg Oral Daily  Dagoberto Ligas, PA-C       omega-3 acid ethyl esters (LOVAZA) capsule 1 g  1 capsule Oral Daily Dagoberto Ligas, PA-C   1 g at 03/16/19 1824   ondansetron (ZOFRAN) tablet 4 mg  4 mg Oral Q6H PRN Dagoberto Ligas, PA-C       Or   ondansetron [facility]) injection 4 mg  4 mg  Intravenous Q6H PRN Dagoberto Ligas, PA-C       oxyCODONE-acetaminophen (PERCOCET/ROXICET) 5-325 MG per tablet 1 tablet  1 tablet Oral Q6H PRN Dagoberto Ligas, PA-C   1 tablet at 03/16/19 1814   pantoprazole (PROTONIX) EC tablet 40 mg  40 mg Oral Daily Dagoberto Ligas, PA-C   40 mg at 03/16/19 1814   polyvinyl alcohol (LIQUIFILM TEARS) 1.4 % ophthalmic solution 1 drop  1 drop Both Eyes QID PRN Dagoberto Ligas, PA-C       pregabalin (LYRICA) capsule 25 mg  25 mg Oral TID PRN Dagoberto Ligas, PA-C   25 mg at 03/16/19 2047   simvastatin (ZOCOR) tablet 10 mg  10 mg Oral Estella Husk, PA-C   10 mg at 03/16/19 2121   traZODone (DESYREL) tablet 100 mg  100 mg Oral QHS Dagoberto Ligas, PA-C   100 mg at 03/16/19 2122   triamcinolone cream (KENALOG) 0.5 % 1 application  1 application Topical Daily PRN Dagoberto Ligas, PA-C          Allergies  Allergen Reactions   Byetta 10 Mcg Pen [Exenatide] Other (See Comments)    Unknown   Ceclor [Cefaclor] Other (See Comments)    Unknown   Celexa [Citalopram Hydrobromide] Other (See Comments)    Sick   Ciprofloxacin Nausea Only and Other (See Comments)    Dizziness   Claritin [Loratadine] Other (See Comments)    Hair fell out   Erythromycin Other (See Comments)    REACTION: Upset stomach   Flexeril [Cyclobenzaprine] Other (See Comments)    Nervous   Fosamax [Alendronate] Other (See Comments)    Made pt sick   Jardiance [Empagliflozin] Other (See Comments)    Vaginal infection, UTI   Motrin [Ibuprofen] Nausea Only   Naproxen Other (See Comments)    REACTION: severe nausea   Norvasc [Amlodipine Besylate] Other (See Comments)    Unknown   Prednisone Other (See  Comments)    REACTION: itchy rash   Prozac [Fluoxetine Hcl] Other (See Comments)    Sick   Sulfamethoxazole-Trimethoprim Nausea And Vomiting and Other (See Comments)    REACTION: Upset stomach   :  Family History  Problem Relation Age of Onset   Stroke Mother    Hypertension Mother    Stroke Father    Hypertension Father    Diabetes Father    Coronary artery disease Father    Heart disease Father    Diabetes Sister    Hypertension Sister    Lung cancer Brother    Sinusitis Brother    Healthy Brother    Diabetes Son    Hypertension Son    Coronary artery disease Other    :  Social History   Socioeconomic History   Marital status: Widowed    Spouse name: Not on file   Number of children: 1   Years of education: 21   Highest education level: Not on file  Occupational History   Occupation: retired  Tobacco Use   Smoking status: Former Smoker    Types: Cigarettes    Quit date: 03/11/1985    Years since quitting: 34.0   Smokeless tobacco: Never Used  Substance and Sexual Activity   Alcohol use: No    Alcohol/week: 0.0 standard drinks   Drug use: No   Sexual activity: Not on file  Other Topics Concern   Not on file  Social History Narrative   Retired. Married. [place]   Right handed   Social Determinants of Health  Financial Resource Strain:    Difficulty of Paying Living Expenses: Not on file  Food Insecurity:    Worried About Charity fundraiser in the Last Year: Not on file   [employer] of Food in the Last Year: Not on file  Transportation Needs:    Lack of Transportation (Medical): Not on file   Lack of Transportation (Non-Medical): Not on file  Physical Activity:    Days of Exercise per Week: Not on file   Minutes of Exercise per Session: Not on file  Stress:    Feeling of Stress : Not on file  Social Connections:    Frequency of Communication with Friends and Family: Not on file   Frequency of Social Gatherings with Friends and Family: Not on  file   Attends Religious Services: Not on file   Active Member of Clubs or Organizations: Not on file   Attends Archivist Meetings: Not on file   Marital Status: Not on file  Intimate Partner Violence:    Fear of Current or Ex-Partner: Not on file   Emotionally Abused: Not on file   Physically Abused: Not on file   Sexually Abused: Not on file  :  REVIEW OF SYSTEMS: Unable to obtain secondary to patient sedation.  Exam: Patient Vitals for the past 24 hrs:  BP Temp Temp src Pulse Resp SpO2 Height Weight  03/17/19 1330 -- -- -- (!) 111 14 97 % -- --  03/17/19 1300 -- (!) 97.2 F (36.2 C) -- -- -- -- -- --  03/17/19 1252 (!) 187/83 -- -- (!) 118 14 99 % -- --  03/17/19 1236 (!) 166/61 -- -- (!) 117 17 98 % -- --  03/17/19 1225 (!) 168/67 (!) 97.2 F (36.2 C) -- (!) 112 11 98 % -- --  03/17/19 1221 (!) 168/67 -- -- (!) 107 (!) 21 98 % -- --  03/17/19 0940 -- -- -- -- -- -- 5' 2.99" (1.6 m) 170 lb (77.1 kg)  03/17/19 0755 (!) 181/66 99.2 F (37.3 C) Axillary 90 (!) 23 95 % -- --  03/17/19 0315 -- 98.1 F (36.7 C) Axillary -- -- -- -- --  03/16/19 2323 -- 98.9 F (37.2 C) Oral -- -- -- -- --  03/16/19 2000 (!) 178/64 97.9 F (36.6 C) Axillary 87 14 93 % -- --  03/16/19 1938 -- 98.2 F (36.8 C) Oral -- -- -- -- --    General: Sedated, appears comfortable Eyes:  no scleral icterus.   ENT:  There were no oropharyngeal lesions.   Neck was without thyromegaly.   Lymphatics:  Negative cervical, supraclavicular or axillary adenopathy.  Respiratory: lungs were clear bilaterally without wheezing or crackles.  Cardiovascular:  Regular rate and rhythm, S1/S2, without murmur, rub or gallop.   GI:  abdomen was soft, flat, nontender, nondistended, without organomegaly.   Musculoskeletal: Right AKA with staples intact.  Dressing to right groin without active bleeding. Skin exam was without ecchymosis, petechiae.   Neuro: Deferred secondary to sedation  LABS:  Lab Results   Component Value Date   WBC 1.4 (LL) 03/17/2019   HGB 9.6 (L) 03/17/2019   HCT 29.7 (L) 03/17/2019   PLT 65 (L) 03/17/2019   GLUCOSE 117 (H) 03/17/2019   CHOL 135 05/04/2018   TRIG 94 05/04/2018   HDL 38 (L) 05/04/2018   LDLCALC 78 05/04/2018   ALT 12 03/16/2019   AST 17 03/16/2019   NA 147 (H) 03/17/2019   K  3.5 03/17/2019   CL 118 (H) 03/17/2019   CREATININE 0.81 03/17/2019   BUN 26 (H) 03/17/2019   CO2 19 (L) 03/17/2019   INR 1.01 12/18/2016   HGBA1C 5.9 (H) 02/17/2019    CT HEAD WO CONTRAST  Result Date: 02/18/2019 CLINICAL DATA:  79 year old female with history of altered level of consciousness. EXAM: CT HEAD WITHOUT CONTRAST TECHNIQUE: Contiguous axial images were obtained from the base of the skull through the vertex without intravenous contrast. COMPARISON:  Head CT 01/28/2019. FINDINGS: Brain: Mild cerebral atrophy. Patchy and confluent areas of decreased attenuation are noted throughout the deep and periventricular white matter of the cerebral hemispheres bilaterally, compatible with chronic microvascular ischemic disease. No evidence of acute infarction, hemorrhage, hydrocephalus, extra-axial collection or mass lesion/mass effect. Vascular: No hyperdense vessel or unexpected calcification. Skull: Normal. Negative for fracture or focal lesion. Sinuses/Orbits: No acute finding. Other: None. IMPRESSION: 1. No acute intracranial abnormalities. 2. Mild cerebral atrophy with chronic microvascular ischemic changes in the cerebral white matter, as above. Electronically Signed   By: Vinnie Langton M.D.   On: 02/18/2019 14:25   CT ABDOMEN PELVIS W CONTRAST  Result Date: 03/15/2019 CLINICAL DATA:  80 year old female status post right above knee amputation and muscle flap to the right groin incision. Concern for cellulitis/soft tissue infection. EXAM: CT ABDOMEN AND PELVIS WITH CONTRAST TECHNIQUE: Multidetector CT imaging of the abdomen and pelvis was performed using the standard  protocol following bolus administration of intravenous contrast. CONTRAST:  116mL OMNIPAQUE IOHEXOL 300 MG/ML  SOLN COMPARISON:  CT of the abdomen pelvis dated 09/05/2018. FINDINGS: Lower chest: Partially visualized small bilateral pleural effusions with associated partial compressive atelectasis of the lung bases. Borderline cardiomegaly. Coronary vascular calcifications noted. No intra-abdominal free air or free fluid. Hepatobiliary: The liver is unremarkable. No intrahepatic biliary ductal dilatation. Minimal layering gallstones. No pericholecystic fluid or evidence of acute cholecystitis by CT. Pancreas: Unremarkable. No pancreatic ductal dilatation or surrounding inflammatory changes. Spleen: Top-normal spleen measuring up to 13 cm in length. Adrenals/Urinary Tract: The adrenal glands are unremarkable. There is no hydronephrosis on either side. Subcentimeter left renal inferior pole hypodense focus is too small to characterize. There is symmetric enhancement and excretion of contrast by both kidneys. The visualized ureters and urinary bladder appear unremarkable. Stomach/Bowel: There is moderate stool throughout the colon. There is no bowel obstruction or active inflammation. There is sigmoid diverticulosis without active inflammatory changes. Appendectomy. Vascular/Lymphatic: Advanced aortoiliac atherosclerotic disease. The IVC is unremarkable. No portal venous gas. There is no adenopathy. Severe atherosclerotic calcification of the superficial femoral arteries bilaterally most severely involving the right lower extremity. There is associated high-grade luminal narrowing and non opacification of the SFA's bilaterally. Reproductive: Hysterectomy. No adnexal masses. Other: There is postsurgical changes of right above knee amputation. There is open wound in the right groin and small pockets of air in the superficial soft tissues of the right groin. There is diffuse edema of the subcutaneous soft tissues of the  right hip and right lower extremity which may represent cellulitis. Clinical correlation is recommended. No drainable fluid collection or abscess. Musculoskeletal: Above knee amputation of the right lower extremity. The edge of the right femoral stump appears sharp and smooth. There is some callus formation adjacent to the distal end of the right femoral stump. No periosteal elevation or bone erosion. There is multilevel degenerative changes of the spine. No acute fracture. IMPRESSION: 1. Postsurgical changes of right above knee amputation. There is an open wound in the right groin  and small pockets of air in the superficial soft tissues of the right groin, likely postsurgical. No drainable fluid collection or abscess. 2. Diffuse edema of the subcutaneous soft tissues of the right hip and right lower extremity may represent cellulitis. Clinical correlation is recommended. 3. No bowel obstruction or active inflammation. Normal appendix. 4. Sigmoid diverticulosis. 5. Small bilateral pleural effusions with partial compressive atelectasis of the lung bases. 6. Aortic Atherosclerosis (ICD10-I70.0). Electronically Signed   By: Anner Crete M.D.   On: 03/15/2019 22:56   CT FEMUR RIGHT W CONTRAST  Result Date: 03/15/2019 CLINICAL DATA:  79 year old female status post right above knee amputation and muscle flap to the right groin incision. Concern for cellulitis/soft tissue infection. EXAM: CT ABDOMEN AND PELVIS WITH CONTRAST TECHNIQUE: Multidetector CT imaging of the abdomen and pelvis was performed using the standard protocol following bolus administration of intravenous contrast. CONTRAST:  171mL OMNIPAQUE IOHEXOL 300 MG/ML  SOLN COMPARISON:  CT of the abdomen pelvis dated 09/05/2018. FINDINGS: Lower chest: Partially visualized small bilateral pleural effusions with associated partial compressive atelectasis of the lung bases. Borderline cardiomegaly. Coronary vascular calcifications noted. No intra-abdominal free  air or free fluid. Hepatobiliary: The liver is unremarkable. No intrahepatic biliary ductal dilatation. Minimal layering gallstones. No pericholecystic fluid or evidence of acute cholecystitis by CT. Pancreas: Unremarkable. No pancreatic ductal dilatation or surrounding inflammatory changes. Spleen: Top-normal spleen measuring up to 13 cm in length. Adrenals/Urinary Tract: The adrenal glands are unremarkable. There is no hydronephrosis on either side. Subcentimeter left renal inferior pole hypodense focus is too small to characterize. There is symmetric enhancement and excretion of contrast by both kidneys. The visualized ureters and urinary bladder appear unremarkable. Stomach/Bowel: There is moderate stool throughout the colon. There is no bowel obstruction or active inflammation. There is sigmoid diverticulosis without active inflammatory changes. Appendectomy. Vascular/Lymphatic: Advanced aortoiliac atherosclerotic disease. The IVC is unremarkable. No portal venous gas. There is no adenopathy. Severe atherosclerotic calcification of the superficial femoral arteries bilaterally most severely involving the right lower extremity. There is associated high-grade luminal narrowing and non opacification of the SFA's bilaterally. Reproductive: Hysterectomy. No adnexal masses. Other: There is postsurgical changes of right above knee amputation. There is open wound in the right groin and small pockets of air in the superficial soft tissues of the right groin. There is diffuse edema of the subcutaneous soft tissues of the right hip and right lower extremity which may represent cellulitis. Clinical correlation is recommended. No drainable fluid collection or abscess. Musculoskeletal: Above knee amputation of the right lower extremity. The edge of the right femoral stump appears sharp and smooth. There is some callus formation adjacent to the distal end of the right femoral stump. No periosteal elevation or bone erosion.  There is multilevel degenerative changes of the spine. No acute fracture. IMPRESSION: 1. Postsurgical changes of right above knee amputation. There is an open wound in the right groin and small pockets of air in the superficial soft tissues of the right groin, likely postsurgical. No drainable fluid collection or abscess. 2. Diffuse edema of the subcutaneous soft tissues of the right hip and right lower extremity may represent cellulitis. Clinical correlation is recommended. 3. No bowel obstruction or active inflammation. Normal appendix. 4. Sigmoid diverticulosis. 5. Small bilateral pleural effusions with partial compressive atelectasis of the lung bases. 6. Aortic Atherosclerosis (ICD10-I70.0). Electronically Signed   By: Anner Crete M.D.   On: 03/15/2019 22:56   DG Ang/Ext/Uni/Or Right  Result Date: 02/17/2019 CLINICAL DATA:  Right  lower extremity arteriogram EXAM: RIGHT ANG/EXT/UNI/ OR CONTRAST:  Not provided FLUOROSCOPY TIME:  45 seconds COMPARISON:  None. FINDINGS: Two intraoperative right lower extremity angiograms are provided for review Initial arteriogram demonstrates the distal end of a below knee bypass graft. While the distal end of the bypass graft anastomosis appear patent, there appears to be a small amount of contrast extravasation about the distal anastomotic site. Subsequent arteriogram centered at the level of the lower leg and hindfoot demonstrates a 2 vessel runoff to the right lower leg and foot via the posterior tibial and likely the peroneal artery. The peroneal artery appears to reconstitute the dorsalis pedis artery at the level of the ankle mortise. The dorsalis pedis artery appears patent to the level of the imaged midfoot. There is short-segment occlusion involving the distal aspect of the posterior tibial artery with early staggered reconstitution. No discrete internal filling defect suggest distal embolism. IMPRESSION: Intraoperative arteriogram as detailed above.  Electronically Signed   By: Sandi Mariscal M.D.   On: 02/17/2019 07:35   VAS [place] GROIN PSEUDOANEURYSM  Result Date: 02/16/2019  ARTERIAL PSEUDOANEURYSM  Exam: Left groin Indications: Patient complains of groin pain and bruising. History: S/p catheterization. Performing Technologist: June Leap RDMS, RVT  Examination Guidelines: A complete evaluation includes B-mode imaging, spectral Doppler, color Doppler, and power Doppler as needed of all accessible portions of each vessel. Bilateral testing is considered an integral part of a complete examination. Limited examinations for reoccurring indications may be performed as noted. +-----------+----------+--------+------+----------+ Left DuplexPSV (cm/s)WaveformPlaqueComment(s) +-----------+----------+--------+------+----------+ CFA            96                             +-----------+----------+--------+------+----------+ Left Vein comments: patent CFV  Findings: An area with well defined borders measuring 4.3 cm x 3.0 cm was visualized arising off of the left CFA with ultrasound characteristics of a pseudoaneurysm. The neck measures approximately 0.8 cm wide and 1.7 cm long.  Diagnosing physician: Harold Barban MD Electronically signed by Harold Barban MD on 02/16/2019 at 5:25:51 PM.   --------------------------------------------------------------------------------    Final    VAS [place] LOWER EXTREMITY ARTERIAL DUPLEX  Result Date: 02/16/2019 LOWER EXTREMITY ARTERIAL DUPLEX STUDY Indications: Rest pain.  Vascular Interventions: 02/15/19 Stent of right external iliac artery, right SFA,                         and right popliteal artery. Current ABI:            02/11/19 noncompressible Performing Technologist: June Leap RDMS, RVT  Examination Guidelines: A complete evaluation includes B-mode imaging, spectral Doppler, color Doppler, and power Doppler as needed of all accessible portions of each vessel. Bilateral testing is considered an integral part of a  complete examination. Limited examinations for reoccurring indications may be performed as noted.  +-----------+--------+-----+---------------+----------+--------+ RIGHT      PSV cm/sRatioStenosis       Waveform  Comments +-----------+--------+-----+---------------+----------+--------+ CFA Prox   302          50-74% stenosis                   +-----------+--------+-----+---------------+----------+--------+ DFA        138                         monophasic         +-----------+--------+-----+---------------+----------+--------+ SFA  Prox   24           occluded       monophasic         +-----------+--------+-----+---------------+----------+--------+ SFA Mid                 occluded                          +-----------+--------+-----+---------------+----------+--------+ SFA Distal              occluded                          +-----------+--------+-----+---------------+----------+--------+ POP Prox                occluded                          +-----------+--------+-----+---------------+----------+--------+ POP Mid                 occluded                          +-----------+--------+-----+---------------+----------+--------+ POP Distal              occluded                          +-----------+--------+-----+---------------+----------+--------+ ATA Distal              occluded                          +-----------+--------+-----+---------------+----------+--------+ PTA Distal              occluded                          +-----------+--------+-----+---------------+----------+--------+ PERO Distal             occluded                          +-----------+--------+-----+---------------+----------+--------+  Summary: Right: 50-74% stenosis noted in the common femoral artery. Total occlusion noted in the superficial femoral artery. Total occlusion noted in the popliteal artery. Total occlusion noted in the anterior tibial artery.  Total occlusion noted in the posterior  tibial artery. Total occlusion noted in the peroneal artery.  See table(s) above for measurements and observations. Electronically signed by Harold Barban MD on 02/16/2019 at 5:26:10 PM.    Final    VAS [place] LOWER EXTREMITY VENOUS (DVT)  Result Date: 03/03/2019  Lower Venous Study Indications: Edema.  Limitations: Pain pain tolerance. Comparison Study: no prior Performing Technologist: Abram Sander RVS  Examination Guidelines: A complete evaluation includes B-mode imaging, spectral Doppler, color Doppler, and power Doppler as needed of all accessible portions of each vessel. Bilateral testing is considered an integral part of a complete examination. Limited examinations for reoccurring indications may be performed as noted.  +---------+---------------+---------+-----------+----------+-------------------+ LEFT     CompressibilityPhasicitySpontaneityPropertiesThrombus Aging      +---------+---------------+---------+-----------+----------+-------------------+ CFV      Full           Yes      Yes                                      +---------+---------------+---------+-----------+----------+-------------------+  SFJ      Full                                                             +---------+---------------+---------+-----------+----------+-------------------+ FV Prox  Full                                                             +---------+---------------+---------+-----------+----------+-------------------+ FV Mid   Full                                                             +---------+---------------+---------+-----------+----------+-------------------+ FV DistalFull                                                             +---------+---------------+---------+-----------+----------+-------------------+ PFV      Full                                                              +---------+---------------+---------+-----------+----------+-------------------+ POP                     Yes      Yes                  unable to tolerate                                                        compression         +---------+---------------+---------+-----------+----------+-------------------+ PTV                                                   unable to tolerate                                                        compression         +---------+---------------+---------+-----------+----------+-------------------+ PERO  Not visualized      +---------+---------------+---------+-----------+----------+-------------------+     Summary: Left: There is no evidence of deep vein thrombosis in the lower extremity. However, portions of this examination were limited- see technologist comments above. No cystic structure found in the popliteal fossa.  *See table(s) above for measurements and observations. Electronically signed by Curt Jews MD on 03/03/2019 at 4:06:25 PM.    Final     ASSESSMENT AND PLAN:  Pancytopenia -The patient has had intermittent leukopenia, neutropenia, anemia, and thrombocytopenia over the past year.  However, counts currently are lower than her baseline. -Suspect pancytopenia is multifactorial related to her underlying sepsis and antibiotics. -Recommend close monitoring of her counts and transfuse PRBCs for hemoglobin less than 7 or platelets less than 20,000 given her recent surgery. -We will review peripheral blood smear.  Sepsis secondary to right groin wound infection -Status post wound debridement earlier today. -Further management per vascular surgery and hospitalist.  JAK2 positive myeloproliferative neoplasm -Patient is followed at [facility] for her MPN. -She has been on the days that in the past but this was stopped secondary to cytopenias. -It appears as though she has  not resumed treatment secondary to declining performance status. -Recommend outpatient follow-up with her hematologist.  Hypogammaglobulinemia -Most recent immunoglobulins were checked on 01/11/2019 and were within normal limits. -She has a history of a low IgG level and has received IVIG in the past.  Thank you for this referral.  Mikey Bussing, DNP, AGPCNP-BC, AOCNP  I have reviewed the above note and seen the patient in the hospital today. I agree with the assessment and plan above and have made amendments where necessary.  Mrs. Nyholm is a 79 year old female with medical history significant for JAK2 myelofibrosis who presents with sepsis. Her blood counts are currently below baseline, with WBC 1.4 (ANC 0.8), Hgb 9.6, and Plt 65. Today she went to the OR for wound debridement and likely source of her infection. She is currently receiving cefepime IV.   At this time the patient has moderate neutropenia with ANC 0.8. GCSF is not recommended in the setting of sepsis (recommended for prevention of neutropenic illness, not treatment). Additionally her counts are likely depressed in the setting of sepsis and IV abx. As such I would recommend continued treatment of the sepsis and monitoring of the blood counts. At this time I would recommend transfusion for Hgb <7.0 or Plt <20 (in the setting of recent surgery and high risk for bleeding). Continue to monitor daily CBC.  As for her hypogammaglobulinemia, this was normal as last check so no IVIG is recommended at this time. At time of discharge please assure the patient has f/u with her primary hematologist at [facility].   Please do not hesitate to call or page with any further questions or concerns.  Ledell Peoples, MD Department of Hematology/Oncology [place] at [facility] Phone: [phone] Pager: [phone] Email: [email or web address]

## 2019-03-17 NOTE — Progress Notes (Signed)
Patient transported to short stay for debridement of right groin wound.  Transported on tele monitor with nurse and transport.  No s/s of distress noted.  Patient was attached to room monitor in short stay and report was called to receiving nurse.  Son would like MD to call after procedure.  CCM notified of patient transport.

## 2019-03-18 DIAGNOSIS — D7581 Myelofibrosis: Secondary | ICD-10-CM

## 2019-03-18 LAB — AEROBIC CULTURE W GRAM STAIN (SUPERFICIAL SPECIMEN): Gram Stain: NONE SEEN

## 2019-03-18 LAB — GLUCOSE, CAPILLARY
Glucose-Capillary: 171 mg/dL — ABNORMAL HIGH (ref 70–99)
Glucose-Capillary: 173 mg/dL — ABNORMAL HIGH (ref 70–99)
Glucose-Capillary: 192 mg/dL — ABNORMAL HIGH (ref 70–99)
Glucose-Capillary: 154 mg/dL — ABNORMAL HIGH (ref 70–99)

## 2019-03-18 MED ORDER — JUVEN PO PACK
1.0000 | PACK | Freq: Two times a day (BID) | ORAL | Status: DC
  Administered 2019-03-18 – 2019-03-19 (×2): 1 via ORAL
  Filled 2019-03-18: qty 1

## 2019-03-18 MED ORDER — MORPHINE SULFATE (PF) 2 MG/ML IV SOLN
2.0000 mg | INTRAVENOUS | Status: DC | PRN
  Administered 2019-03-18 – 2019-03-19 (×2): 2 mg via INTRAVENOUS
  Filled 2019-03-18 (×2): qty 1

## 2019-03-18 MED ORDER — ENSURE ENLIVE PO LIQD
237.0000 mL | Freq: Four times a day (QID) | ORAL | Status: DC
  Administered 2019-03-18 – 2019-03-19 (×4): 237 mL via ORAL

## 2019-03-18 MED ORDER — ADULT MULTIVITAMIN W/MINERALS CH
1.0000 | ORAL_TABLET | Freq: Every day | ORAL | Status: DC
  Administered 2019-03-18 – 2019-03-19 (×2): 1 via ORAL
  Filled 2019-03-18 (×2): qty 1

## 2019-03-18 NOTE — Progress Notes (Signed)
PROGRESS NOTE  Debra Hopkins [id number] DOB: [birth year] DOA: 03/15/2019 PCP: Monico Blitz, MD   LOS: 3 days   Brief narrative: As per HPI,  Debra Hopkins is a 79 y.o. female with medical history significant of history of CAD, diabetes mellitus, TIA, left CEA 2008, fibromyalgia, macular degeneration, peripheral vascular disease with multiple revascularization procedures and maintained on low-dose aspirin presented on 02/15/2019 with chronic RLE ischemic changes and toe ulceration. Recent aortogram showed aortic and iliac segments heavily calcified. 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 [name] on 02/15/2019. Noted postoperatively mottling of the right foot started on heparin drip. 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 Donnetta Hutching. Hospital course further complicated by acute blood loss anemia patient was transfused 2 units with latest hemoglobin 9.5. Subcutaneous heparin was initiated for DVT prophylaxis 02/23/2019 as well as remaining on low-dose aspirin. Vancomycin resumed 03/01/2023 wound coverage.  Patient admitted to acute rehab where she  developed fever chills and was transferred to medical service for sepsis.  Patient is being followed by vascular surgery with a wound VAC and dressing.  Antibiotics was broadened and she was transferred to acute care for treatment of sepsis probably secondary to infected right groin wound.  Assessment/Plan:  Principal Problem:   Sepsis ([place]) Active Problems:   Mixed hyperlipidemia   Essential hypertension, benign   Coronary atherosclerosis   PVD   Hypothyroidism   GERD (gastroesophageal reflux disease)   Type 2 diabetes mellitus with stage 3 chronic  kidney disease, without long-term current use of insulin (HCC)   S/P AKA (above knee amputation) unilateral, right ([place])   Myelofibrosis ([place])  Sepsis secondary to right groin wound infection. On  cefepime.  Vascular surgery on board.  Blood cultures negative so far.  Wound culture with klebsiella and enterobacter.  Status post debridement by vascular surgery 03/17/19.  Will follow vascular surgery recommendations.  T-max of 98.2 F at this time.  Status post  right AKA with debridement of the right groin with a sartorius muscle flap in the past. Further debridement by vascular surgery 03/17/2019.  Follow vascular surgery recommendations.  History of coronary artery disease.  No acute issues.     History of GERD- continue PPI  Hypothyroidism -continue Synthroid  Essential hypertension.  On  clonidine, hydralazine, losartan.  Monitor renal function closely.  Pancytopenia, neutropenia likely from  from sepsis at this time    History of myelofibrosis.. Hematooncology was consulted.  G-CSF is not recommended at this time.  Oncology recommended Continue broad-spectrum antibiotic at this time.  Closely monitor WBC. Of note,  patient follows up with Dr Federico Flake, Hematologist  at [facility].   Will transfuse for hemoglobin less than 7 and platelet transfusion for less than 20,000.  Palliative care has been consulted for goals of care discussion.  Concern for difficulty swallowing as per nursing staff.  Appreciated speech and swallow- on liquid diet at this time.  CKD stage IIIa.  We will continue to monitor BMP.  Check BMP in a.m. creatinine of 0.8.  VTE Prophylaxis:  lovenox   Code Status:  Full  Family Communication:  None today  Disposition Plan: rehab/ SNF.  Continue PT evaluation.  Follow surgical recommendations.  Palliative care has  been consulted.   Consultants:  Vascular surgery   hematology  Procedures:  Surgical debridement 03/17/19  Antibiotics:  Anti-infectives  (From admission, onward)   Start     Dose/Rate Route Frequency Ordered Stop   03/17/19 2200  vancomycin (VANCOREADY) IVPB 1750 mg/350 mL  Status:  Discontinued     1,750 mg 175 mL/hr over 120 Minutes Intravenous Every 48 hours 03/16/19 0034 03/17/19 1444   03/16/19 1000  ceFEPIme (MAXIPIME) 2 g in sodium chloride 0.9 % 100 mL IVPB     2 g 200 mL/hr over 30 Minutes Intravenous 2 times daily 03/16/19 0034       Subjective:  Today, patient complains of generalized body pain.  No nausea vomiting or shortness of breath.  Objective: Vitals:   03/18/19 0400 03/18/19 0413  BP:  (!) 153/65  Pulse: (!) 41 91  Resp: (!) 21 (!) 21  Temp:  98.6 F (37 C)  SpO2: 100%     Intake/Output Summary (Last 24 hours) at 03/18/2019 0723 Last data filed at 03/18/2019 0700 Gross per 24 hour  Intake 2415.49 ml  Output 950 ml  Net 1465.49 ml   Filed Weights   03/16/19 0300 03/17/19 0940  Weight: 77.1 kg 77.1 kg   Body mass index is 30.12 kg/m.   Physical Exam: GENERAL: Patient was slightly somnolent at the time of my evaluation.  Communicative.  Not in obvious distress.  On mittens HENT: No scleral pallor or icterus. Pupils equally reactive to light. Oral mucosa is moist NECK: is supple, no palpable thyroid enlargement. CHEST: Clear to auscultation. No crackles or wheezes. Non tender on palpation. Diminished breath sounds bilaterally.  Right chest wall Port-A-Cath in place. CVS: S1 and S2 heard, no murmur. Regular rate and rhythm. No pericardial rub. ABDOMEN: Soft, non-tender, bowel sounds are present. EXTREMITIES:  Right groin with dressing and wound VAC.Marland KitchenStatus post right above knee amputation. CNS: Cranial nerves are intact.  Communicative, mildly somnolent at the time of discharge. SKIN: Status post right above-knee amputation, right groin with wound VAC.Marland Kitchen  Data Review: I have personally reviewed the following laboratory data and studies,  CBC: Recent Labs  Lab 03/12/19 0357 03/13/19 0439  03/14/19 0327 03/15/19 0544 03/15/19 2300 03/16/19 0508 03/17/19 0818  WBC 3.3* 2.4* 1.6* 1.8* 1.5* 1.7* 1.4*  NEUTROABS 1.9 1.3* 0.8* 1.3* 0.8*  --   --   HGB 7.6* 7.4* 6.9* 8.8* 8.9* 9.2* 9.6*  HCT 23.1* 23.3* 21.6* 26.5* 27.6* 27.9* 29.7*  MCV 92.8 95.1 96.4 92.0 93.6 91.2 94.0  PLT 95* 88* 79* 74* 72* 75* 65*   Basic Metabolic Panel: Recent Labs  Lab 03/13/19 0439 03/14/19 0327 03/15/19 0544 03/16/19 0508 03/17/19 0818  NA 143 142 144 144 147*  K 4.6 4.3 4.0 4.0 3.5  CL 107 109 110 112* 118*  CO2 26 24 25  21* 19*  GLUCOSE 182* 198* 170* 148* 117*  BUN 69* 66* 53* 41* 26*  CREATININE 1.32* 1.14* 1.06* 0.98 0.81  CALCIUM 8.8* 8.9 8.9 9.3 9.2  MG  --   --   --   --  2.1   Liver Function Tests: Recent Labs  Lab 03/12/19 0357 03/15/19 0544 03/16/19 0508  AST 21 23 17   ALT 12 15 12   ALKPHOS 62 60 62  BILITOT 0.4 0.4 0.5  PROT 4.8* 4.5* 4.9*  ALBUMIN 1.9* 1.6* 1.8*   No results for input(s): LIPASE, AMYLASE in the last 168 hours. No results for input(s): AMMONIA in the last 168  hours. Cardiac Enzymes: No results for input(s): CKTOTAL, CKMB, CKMBINDEX, TROPONINI in the last 168 hours. BNP (last 3 results) No results for input(s): BNP in the last 8760 hours.  ProBNP (last 3 results) No results for input(s): PROBNP in the last 8760 hours.  CBG: Recent Labs  Lab 03/17/19 0751 03/17/19 1010 03/17/19 1307 03/17/19 1711 03/17/19 2118  GLUCAP 112* 110* 168* 187* 138*   Recent Results (from the past 240 hour(s))  Culture, Urine     Status: None   Collection Time: 03/14/19  2:53 PM   Specimen: Urine, Catheterized  Result Value Ref Range Status   Specimen Description URINE, CATHETERIZED  Final   Special Requests Normal  Final   Culture   Final    NO GROWTH Performed at [facility], [address] [place], [place] [address]    Report Status 03/16/2019 FINAL  Final  Aerobic Culture (superficial specimen)     Status: None (Preliminary result)    Collection Time: 03/15/19  4:05 PM   Specimen: Groin  Result Value Ref Range Status   Specimen Description GROIN RIGHT  Final   Special Requests NONE  Final   Gram Stain   Final    NO WBC SEEN MODERATE GRAM VARIABLE ROD RARE GRAM POSITIVE COCCI IN PAIRS Performed at [facility], [place] [address] [place], [place] [address]    Culture   Final    ABUNDANT KLEBSIELLA OXYTOCA ABUNDANT ENTEROBACTER AEROGENES    Report Status PENDING  Incomplete   Organism ID, Bacteria KLEBSIELLA OXYTOCA  Final   Organism ID, Bacteria ENTEROBACTER AEROGENES  Final      Susceptibility   Enterobacter aerogenes - MIC*    CEFAZOLIN RESISTANT Resistant     CEFEPIME <=0.12 SENSITIVE Sensitive     CEFTAZIDIME <=1 SENSITIVE Sensitive     CEFTRIAXONE <=0.25 SENSITIVE Sensitive     CIPROFLOXACIN <=0.25 SENSITIVE Sensitive     GENTAMICIN <=1 SENSITIVE Sensitive     IMIPENEM <=0.25 SENSITIVE Sensitive     TRIMETH/SULFA <=20 SENSITIVE Sensitive     PIP/TAZO <=4 SENSITIVE Sensitive     * ABUNDANT ENTEROBACTER AEROGENES   Klebsiella oxytoca - MIC*    AMPICILLIN >=32 RESISTANT Resistant     CEFAZOLIN 16 SENSITIVE Sensitive     CEFEPIME <=0.12 SENSITIVE Sensitive     CEFTAZIDIME <=1 SENSITIVE Sensitive     CEFTRIAXONE <=0.25 SENSITIVE Sensitive     CIPROFLOXACIN <=0.25 SENSITIVE Sensitive     GENTAMICIN <=1 SENSITIVE Sensitive     IMIPENEM <=0.25 SENSITIVE Sensitive     TRIMETH/SULFA <=20 SENSITIVE Sensitive     AMPICILLIN/SULBACTAM 16 INTERMEDIATE Intermediate     PIP/TAZO <=4 SENSITIVE Sensitive     * ABUNDANT KLEBSIELLA OXYTOCA  Blood culture , two sites.     Status: None (Preliminary result)   Collection Time: 03/15/19  6:42 PM   Specimen: BLOOD  Result Value Ref Range Status   Specimen Description BLOOD LEFT ANTECUBITAL  Final   Special Requests   Final    BOTTLES DRAWN AEROBIC AND ANAEROBIC Blood Culture adequate volume   Culture   Final    NO GROWTH 2 DAYS Performed at [facility], [place] [address], [place], [place] [address]    Report Status PENDING  Incomplete  Culture, blood (routine x 2)     Status: None (Preliminary result)   Collection Time: 03/15/19  6:55 PM   Specimen: BLOOD  Result Value Ref Range Status   Specimen Description  BLOOD LEFT ANTECUBITAL  Final   Special Requests   Final    BOTTLES DRAWN AEROBIC ONLY Blood Culture adequate volume   Culture   Final    NO GROWTH 2 DAYS Performed at [facility], [address] [place], [place] [address]    Report Status PENDING  Incomplete     Studies: No results found.  Scheduled Meds: . aspirin EC  81 mg Oral Daily  . calcium citrate  1 tablet Oral Daily  . Chlorhexidine Gluconate Cloth  6 each Topical Daily  . cholecalciferol  2,000 Units Oral Daily  . cloNIDine  0.2 mg Oral QHS  . collagenase  1 application Topical Daily  . enoxaparin (LOVENOX) injection  40 mg Subcutaneous Daily  . hydrALAZINE  25 mg Oral QHS  . insulin aspart  0-15 Units Subcutaneous TID WC  . insulin aspart  0-5 Units Subcutaneous QHS  . insulin glargine  10 Units Subcutaneous QHS  . levothyroxine  50 mcg Oral QAC breakfast  . losartan  100 mg Oral Daily  . NIFEdipine  30 mg Oral Daily  . omega-3 acid ethyl esters  1 capsule Oral Daily  . pantoprazole  40 mg Oral Daily  . simvastatin  10 mg Oral QODAY  . traZODone  100 mg Oral QHS    Continuous Infusions: . sodium chloride 100 mL/hr at 03/18/19 0605  . ceFEPime (MAXIPIME) IV 2 g (03/17/19 2206)     Flora Lipps, MD  [facility] 03/18/2019

## 2019-03-18 NOTE — Progress Notes (Signed)
Physical Therapy Wound Treatment Patient Details  Name: Debra Hopkins MRN: [id number] Date of Birth: November 04, 1940  Today's Date: 03/18/2019 Time: 5277-8242 Time Calculation (min): 45 min  Subjective  Subjective: Confused with mittens donned. Yelling out and thrashing around bed with rolling. Pt biting at L mitten and was able to get it off as she broke through the strap. RN notified.  Patient and Family Stated Goals: None stated this session.  Prior Treatments: Off loading and dressing changes by nursing staff  Pain Score:  Appeared painful with treatment. RN gave PO pain meds at end of session and requested IV pain meds for future sessions.   Wound Assessment  Pressure Injury 03/08/19 Coccyx Right;Left;Medial Unstageable - Full thickness tissue loss in which the base of the injury is covered by slough (yellow, tan, gray, green or brown) and/or eschar (tan, brown or black) in the wound bed. yellow, black and gr (Active)  Dressing Type ABD;Barrier Film (skin prep);Gauze (Comment);Moist to dry 03/18/19 1349  Dressing Clean;Dry;Intact;Changed 03/18/19 1349  Dressing Change Frequency Daily 03/18/19 1349  State of Healing Non-healing 03/18/19 1349  Site / Wound Assessment Red;Yellow;Black 03/18/19 1349  % Wound base Red or Granulating 5% 03/18/19 1349  % Wound base Yellow/Fibrinous Exudate 75% 03/18/19 1349  % Wound base Black/Eschar 20% 03/18/19 1349  % Wound base Other/Granulation Tissue (Comment) 0% 03/18/19 1349  Peri-wound Assessment Pink 03/18/19 1349  Wound Length (cm) 5.2 cm 03/16/19 1400  Wound Width (cm) 4.5 cm 03/16/19 1400  Wound Depth (cm) 0.1 cm 03/16/19 1400  Wound Surface Area (cm^2) 23.4 cm^2 03/16/19 1400  Wound Volume (cm^3) 2.34 cm^3 03/16/19 1400  Margins Unattached edges (unapproximated) 03/18/19 1349  Drainage Amount None 03/18/19 1349  Drainage Description Serosanguineous 03/18/19 1349  Treatment Debridement (Selective);Hydrotherapy (Pulse lavage);Packing (Saline  gauze) 03/18/19 1349   Santyl applied to wound bed prior to applying dressing.     Hydrotherapy Pulsed lavage therapy - wound location: Sacrum Pulsed Lavage with Suction (psi): 8 psi Pulsed Lavage with Suction - Normal Saline Used: 1000 mL Pulsed Lavage Tip: Tip with splash shield Selective Debridement Selective Debridement - Location: Sacrum Selective Debridement - Tools Used: Forceps;Scalpel Selective Debridement - Tissue Removed: Black and yellow unviable tissue   Wound Assessment and Plan  Wound Therapy - Assess/Plan/Recommendations Wound Therapy - Clinical Statement: Necrotic tissue beginning to soften and debridement was more successful today. This patient will benefit from continued hydrotherapy for selective removal of unviable tissue, to decrease bioburden and promote wound bed healing.  Wound Therapy - Functional Problem List: Global weakness in the setting of multiple wounds, R AKA, and gross debility from prolonged hospital stay.  Factors Delaying/Impairing Wound Healing: Diabetes Mellitus;Immobility;Multiple medical problems Hydrotherapy Plan: Debridement;Dressing change;Pulsatile lavage with suction;Patient/family education Wound Therapy - Frequency: 6X / week Wound Therapy - Follow Up Recommendations: Other (comment)(CIR vs SNF) Wound Plan: See above  Wound Therapy Goals- Improve the function of patient's integumentary system by progressing the wound(s) through the phases of wound healing (inflammation - proliferation - remodeling) by: Decrease Necrotic Tissue to: 20% Decrease Necrotic Tissue - Progress: Progressing toward goal Increase Granulation Tissue to: 75% Increase Granulation Tissue - Progress: Progressing toward goal Improve Drainage Characteristics: Min;Serous Improve Drainage Characteristics - Progress: Progressing toward goal Goals/treatment plan/discharge plan were made with and agreed upon by patient/family: Yes Time For Goal Achievement: 7 days Wound  Therapy - Potential for Goals: Good  Goals will be updated until maximal potential achieved or discharge criteria met.  Discharge criteria: when goals  achieved, discharge from hospital, MD decision/surgical intervention, no progress towards goals, refusal/missing three consecutive treatments without notification or medical reason.  GP     [name] 03/18/2019, 2:18 PM  Rolinda Roan, PT, DPT Acute Rehabilitation Services Pager: [phone] Office: [phone]

## 2019-03-18 NOTE — Progress Notes (Signed)
  Progress Note    03/18/2019 10:55 AM 1 Day Post-Op  Subjective: No acute issues  Vitals:   03/18/19 0413 03/18/19 0822  BP: (!) 153/65 (!) 161/71  Pulse: 91 99  Resp: (!) 21 16  Temp: 98.6 F (37 C) 98.4 F (36.9 C)  SpO2:  100%    Physical Exam: Awake and alert Right groin wound evaluated noted to be healthy without any evidence of further necrosis or infection  CBC    Component Value Date/Time   WBC 1.4 (LL) 03/17/2019 0818   RBC 3.16 (L) 03/17/2019 0818   HGB 9.6 (L) 03/17/2019 0818   HCT 29.7 (L) 03/17/2019 0818   PLT 65 (L) 03/17/2019 0818   MCV 94.0 03/17/2019 0818   MCH 30.4 03/17/2019 0818   MCHC 32.3 03/17/2019 0818   RDW 14.6 03/17/2019 0818   LYMPHSABS 0.4 (L) 03/15/2019 2300   MONOABS 0.3 03/15/2019 2300   EOSABS 0.0 03/15/2019 2300   BASOSABS 0.0 03/15/2019 2300    BMET    Component Value Date/Time   NA 147 (H) 03/17/2019 0818   NA 137 11/04/2018 1132   K 3.5 03/17/2019 0818   CL 118 (H) 03/17/2019 0818   CO2 19 (L) 03/17/2019 0818   GLUCOSE 117 (H) 03/17/2019 0818   BUN 26 (H) 03/17/2019 0818   BUN 24 11/04/2018 1132   CREATININE 0.81 03/17/2019 0818   CALCIUM 9.2 03/17/2019 0818   GFRNONAA >60 03/17/2019 0818   GFRAA >60 03/17/2019 0818    INR    Component Value Date/Time   INR 1.01 12/18/2016 1508     Intake/Output Summary (Last 24 hours) at 03/18/2019 1055 Last data filed at 03/18/2019 0900 Gross per 24 hour  Intake 2943.37 ml  Output 950 ml  Net 1993.37 ml     Assessment/plan:  79 y.o. 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  Now has right groin wound.  We will continue wet-to-dry dressings here all appears viable without infection at this time.  Palliative care has been consulted for goals of care and I discussed this with her son yesterday.  Jossue Rubenstein C. [name], MD [facility] Office: [phone] Pager: [phone]  03/18/2019 10:55 AM

## 2019-03-18 NOTE — Progress Notes (Signed)
MD paged to request on pt behalf iv pain med for wound care therapy as pt noted to be moaning during tx.

## 2019-03-18 NOTE — Progress Notes (Signed)
No agitation seen as patient is resting comfortably. Notified Danny NP to D/C the restrain as there's no indication for it right now. Restrain removed. Will continue to monitor

## 2019-03-18 NOTE — Progress Notes (Signed)
Initial Nutrition Assessment  INTERVENTION:   Pt with continued poor PO intake at meals. Recommend considering Cortrak NG tube placement and initiation of enteral nutrition as wounds are unlikely to heal without increased kcal and protein which can be provided via nutrition support.  Cortrak service next available 1/8  Recommendations: - Osmolite 1.2 @ 55 ml/hr (1320 ml/day) - Pro-stat 30 ml BID per tube  -Ensure Enlive po QID, each supplement provides 350 kcal and 20 grams of protein -Juven Fruit Punch BID, each serving provides 95kcal and 2.5g of protein (amino acids glutamine and arginine) -Multivitamin with minerals daily  NUTRITION DIAGNOSIS:   Increased nutrient needs related to wound healing as evidenced by estimated needs.  GOAL:   Patient will meet greater than or equal to 90% of their needs  MONITOR:   PO intake, Supplement acceptance, Labs, Weight trends, I & O's, Skin  REASON FOR ASSESSMENT:   Consult Assessment of nutrition requirement/status, Poor PO, Wound healing  ASSESSMENT:   79 year old female with PMH of CAD, DM, TIA, fibromyalgia, macular degeneration, PVD with multiple revascularization procedures. Presented on 02/15/19 with chronic RLE ischemic changes and toe ulceration. Recent aortogram showed aortic and iliac segments heavily calcified. 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 Pt admitted to CIR on 03/02/19.   12/29 - s/p I&D of right groin, placement of wound VAC 01/03 - VAC removed 01/05: discharged from [facility], transferred to 2W 01/06: s/p Sharp excisional debridement of right groin wound, irrigation of  right groin wound   **RD working remotely**  Patient's PO intake has not improved since last seen by RD team on 1/4. Recommend continued consideration of Cortrak tube placement to aid wound healing. Will order Ensure and Juven supplements to provide additional kcals and protein PO.  Current weight: 170 lbs.  I/Os: +2.1L since admit UOP: 800 ml x 24 hrs  Per nursing documentation, pt with mild edema in UEs and LLE. Pt with rt AKA.  Medications: Calcium citrate tablet, Vitamin D tablet, Lovaza capsule  Labs reviewed: CBGs: 138-171  NUTRITION - FOCUSED PHYSICAL EXAM:  Working remotely. Suspect depletions have not changed from previous notes (03/10/19).  Diet Order:   Diet Order            Diet full liquid Room service appropriate? Yes; Fluid consistency: Thin  Diet effective now              EDUCATION NEEDS:   No education needs have been identified at this time  Skin:  Skin Integrity Issues:: DTI, Stage II, Incisions DTI: left heel Stage II: bilateral buttocks Unstageable: bilateral coccyx Incisions: right groin  Last BM:  1/6 -type 1  Height:   Ht Readings from Last 1 Encounters:  03/17/19 5' 2.99" (1.6 m)    Weight:   Wt Readings from Last 1 Encounters:  03/17/19 77.1 kg    Ideal Body Weight:  48.1 kg  BMI:  Body mass index is 30.12 kg/m.  Estimated Nutritional Needs:   Kcal:  1700-1900  Protein:  100-110g  Fluid:  1.9L/day  Clayton Bibles, MS, RD, LDN Inpatient Clinical Dietitian Pager: [phone] After Hours Pager: [phone]

## 2019-03-18 NOTE — Progress Notes (Signed)
  Speech Language Pathology Treatment: Dysphagia  Patient Details Name: Debra Hopkins MRN: [id number] DOB: 1940/12/29 Today's Date: 03/18/2019 Time: [id number] SLP Time Calculation (min) (ACUTE ONLY): 9 min  Assessment / Plan / Recommendation Clinical Impression  Pt's oral cavity remains clear after dried blood clean out yesterday although mucosa is dry. Repositioned with PT assist; pt accepted puree in small amounts with pursed lips characteristic of pt's with cognitive impairments. Straw sips thin water consumed without s/s aspiration over several trials. Did not attempt solids this date as decreased mastication and potential pocketing is suspected. Puree (Dys 1) is appropriate, thin liquids, crush pills. Pt admitted from inpatient rehab and may have potential to advance texture- uncertain of baseline. Will see if she can upgrade if still here first of next week or recommend ST at next venue. Aspiration risk appears lower at present.    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 The patient was sent from the acute rehabilitation center secondary to sepsis. 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      SLP Plan  Continue with current plan of care       Recommendations  Diet recommendations: Dysphagia 1 (puree);Thin liquid Liquids provided via: Straw;Cup Medication Administration: Crushed with puree Supervision: Patient able to self feed;Full supervision/cueing for compensatory strategies Compensations: Slow  rate;Small sips/bites;Lingual sweep for clearance of pocketing Postural Changes and/or Swallow Maneuvers: Seated upright 90 degrees                Oral Care Recommendations: Oral care BID Follow up Recommendations: (TBD) SLP Visit Diagnosis: Dysphagia, unspecified (R13.10) Plan: Continue with current plan of care                       Houston Siren 03/18/2019, 12:14 PM  Orbie Pyo Colvin Caroli.Ed Risk analyst [phone] Office [phone]

## 2019-03-18 NOTE — Care Management Important Message (Signed)
Important Message  Patient Details  Name: Debra Hopkins MRN: [id number] Date of Birth: [birth year]   Medicare Important Message Given:  Yes     Orbie Pyo 03/18/2019, 3:36 PM

## 2019-03-19 DIAGNOSIS — Z66 Do not resuscitate: Secondary | ICD-10-CM

## 2019-03-19 DIAGNOSIS — Z515 Encounter for palliative care: Secondary | ICD-10-CM

## 2019-03-19 DIAGNOSIS — Z7189 Other specified counseling: Secondary | ICD-10-CM

## 2019-03-19 LAB — CBC
HCT: 26 % — ABNORMAL LOW (ref 36.0–46.0)
Hemoglobin: 8.4 g/dL — ABNORMAL LOW (ref 12.0–15.0)
MCH: 30.5 pg (ref 26.0–34.0)
MCHC: 32.3 g/dL (ref 30.0–36.0)
MCV: 94.5 fL (ref 80.0–100.0)
Platelets: 37 10*3/uL — ABNORMAL LOW (ref 150–400)
RBC: 2.75 MIL/uL — ABNORMAL LOW (ref 3.87–5.11)
RDW: 14.5 % (ref 11.5–15.5)
WBC: 1.3 10*3/uL — CL (ref 4.0–10.5)
nRBC: 0 % (ref 0.0–0.2)

## 2019-03-19 LAB — GLUCOSE, CAPILLARY
Glucose-Capillary: 110 mg/dL — ABNORMAL HIGH (ref 70–99)
Glucose-Capillary: 98 mg/dL (ref 70–99)
Glucose-Capillary: 98 mg/dL (ref 70–99)

## 2019-03-19 LAB — COMPREHENSIVE METABOLIC PANEL
ALT: 20 U/L (ref 0–44)
AST: 20 U/L (ref 15–41)
Albumin: 1.8 g/dL — ABNORMAL LOW (ref 3.5–5.0)
Alkaline Phosphatase: 44 U/L (ref 38–126)
Anion gap: 9 (ref 5–15)
BUN: 31 mg/dL — ABNORMAL HIGH (ref 8–23)
CO2: 21 mmol/L — ABNORMAL LOW (ref 22–32)
Calcium: 9.2 mg/dL (ref 8.9–10.3)
Chloride: 126 mmol/L — ABNORMAL HIGH (ref 98–111)
Creatinine, Ser: 0.79 mg/dL (ref 0.44–1.00)
GFR calc Af Amer: >60 mL/min (ref >60)
GFR calc non Af Amer: >60 mL/min (ref >60)
Glucose, Bld: 123 mg/dL — ABNORMAL HIGH (ref 70–99)
Potassium: 3.1 mmol/L — ABNORMAL LOW (ref 3.5–5.1)
Sodium: 156 mmol/L — ABNORMAL HIGH (ref 135–145)
Total Bilirubin: 0.4 mg/dL (ref 0.3–1.2)
Total Protein: 4.6 g/dL — ABNORMAL LOW (ref 6.5–8.1)

## 2019-03-19 LAB — MAGNESIUM: Magnesium: 2 mg/dL (ref 1.7–2.4)

## 2019-03-19 LAB — PHOSPHORUS: Phosphorus: 2.3 mg/dL — ABNORMAL LOW (ref 2.5–4.6)

## 2019-03-19 MED ORDER — BISACODYL 10 MG RE SUPP: 10.0000 mg | Freq: Every day | RECTAL | Status: DC | PRN

## 2019-03-19 MED ORDER — LORAZEPAM 2 MG/ML IJ SOLN: 0.5000 mg | INTRAMUSCULAR | Status: DC | PRN

## 2019-03-19 MED ORDER — GLYCOPYRROLATE 0.2 MG/ML IJ SOLN
0.4000 mg | INTRAMUSCULAR | Status: DC
  Administered 2019-03-19 – 2019-03-22 (×19): 0.4 mg via INTRAVENOUS
  Filled 2019-03-19 (×19): qty 2

## 2019-03-19 MED ORDER — MORPHINE SULFATE (PF) 2 MG/ML IV SOLN
1.0000 mg | INTRAVENOUS | Status: DC | PRN
  Administered 2019-03-19 – 2019-03-22 (×12): 2 mg via INTRAVENOUS
  Filled 2019-03-19 (×13): qty 1

## 2019-03-19 NOTE — Consult Note (Addendum)
Consultation Note Date: 03/19/2019   Patient Name: Debra Hopkins  DOB: 28-Nov-1940  MRN: [id number]  Age / Sex: 79 y.o., female  PCP: Monico Blitz, MD Referring Physician: Flora Lipps, MD  Reason for Consultation: Establishing goals of care  HPI/Patient Profile: Per intake H&P --> Debra Hopkins is a 79 y.o. female with medical history significant of history of CAD, diabetes mellitus, TIA, left CEA 2008, fibromyalgia, macular degeneration, peripheral vascular disease with multiple revascularization procedures and maintained on low-dose aspirin. History taken from chart review and patient. Presented on 02/15/2019 with chronic RLE ischemic changes and toe ulceration. Recent aortogram showed aortic and iliac segments heavily calcified. 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 [name] on 02/15/2019. Noted postoperatively mottling of the right foot started on heparin drip. 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 Donnetta Hutching. Hospital course further complicated by acute blood loss anemia patient was transfused 2 units with latest hemoglobin 9.5. Subcutaneous heparin was initiated for DVT prophylaxis 02/23/2019 as well as remaining on low-dose aspirin. Vancomycin resumed 03/01/2023 wound coverage.  Patient admitted to acute rehab where she has now developed fever chills and generally septic.  She is being followed by vascular surgery with a wound VAC and dressing.  Antibiotics was broadened and she is transferred back to acute care for treatment of sepsis probably infected right groin wound.  Clinical Assessment and Goals of  Care: Debra Hopkins was re-hospitalized on 1/5 for sepsis in her right groin. She has steadily been declining since hospitalization despite broad antibiotic therapy. Her course over the last month has been complicated. Per vascular surgery transitioning focus is an appropriate next step.  Met with Debra Salsbury at bedside who was grimacing in pain. Her bedside RN, [name] had just given her some pain medication. She was able to open her eyes and track me for minimal time. Not able to speak in response to my questions.   Called patients son, Debra Hopkins asked what had been going on with Debra Hopkins over the past few weeks. He shared that she is recently identified to be in a dire situation as she has severe wounds on her right groin and bottom that are posing a serious threat of becoming worse. He stated that the right groin could become gangrenous.   Debra Hopkins saw his mother yesterday and feels her situation is "coming to the point that the idea of healing is becoming less and less." He feels that she has suffered in so much pain for so long.   Based upon prior conversations that Debra Hopkins had with his mother he does not feel that she would want to continue aggressive interventions. He shared that if she cannot maintain a quality of life she would want to be kept comfortable.  We discussed what "keeping comfortable" in the hospital setting looks like which he agreed is aligned with what the patient would want for herself.   Antibiotics continued only while in the hospital, will not continue when she discharges home.   We completed a MOST together over the telephone.   Scott asked if there is anyway to get [name] in [place] with hospice. I endorsed that we would try our best. A TOC referral has been placed.  Discussed conversation with Dr. [name] and Dr. [name]  Decision Maker: Debra Hopkins (son) - [phone]  SUMMARY OF RECOMMENDATIONS   Online MOST completed DNAR/DNI Initiate comfort measures TOC  referral for Hospice to home transition   Code Status/Advance Care Planning:  DNR   Comfort Measures initiated   Symptom Management:  Prognosis is poor, days to weeks in the setting of no PO intake.    Pain, in the setting of debility:  - Morphine 1-1m IV Q1H PRN   - Methocarbomol 7572mPO Bid, while able to take orals  - Heat Pack PRN Agitation:  - Lorazepam 0.24m87m.g IV Q1H Secretions:  - Glycopyrrolate 0.4mg64m Q4H ATC Dry Eyes:  - Artificial Tears PRN Constipation:  - Bisacodyl 10mg23mWound Care:  - Turn Q2H  - Santyl as ordered  - Daily dressing changes  - Lift heels  Palliative Prophylaxis:   Aspiration, Bowel Regimen, Eye Care, Frequent Pain Assessment, Oral Care, Palliative Wound Care and Turn Reposition  Additional Recommendations (Limitations, Scope, Preferences):  Full Comfort Care  Psycho-social/Spiritual:   Desire for further Chaplaincy support: Yes  Additional Recommendations: Caregiving  Support/Resources  Prognosis:   < 2 weeks  Discharge Planning: Home with Hospice      Primary Diagnoses: Present on Admission: . Mixed hyperlipidemia . Essential hypertension, benign . Coronary atherosclerosis . PVD . Hypothyroidism . GERD (gastroesophageal reflux disease) . Type 2 diabetes mellitus with stage 3 chronic kidney disease, without long-term current use of insulin (HCC) [name]) [place], interviewed the patient and family, and examined the patient. The following aspects are pertinent.  Past Medical History:  Diagnosis Date  . Arthritis   . CAD (coronary artery disease)   . Cancer (HCC) [place] Carotid artery occlusion   . Cataracts, bilateral   . DDD (degenerative disc disease)   . Diabetes mellitus    Type 2  . Diverticulitis   . Dyspnea   . Fibromyalgia   . GERD (gastroesophageal reflux disease)   . Heart murmur   . Hemorrhoids   . History of shingles   . Impaired hearing   . Macular  degeneration   . Other and unspecified hyperlipidemia   . PVD (peripheral vascular disease) (HCC) [place] TIA (transient ischemic attack)   . Unspecified essential hypertension   . Varicose veins    Social History   Socioeconomic History  . Marital status: Widowed    Spouse name: Not on file  . Number of children: 1  . Years of education: 12  .25ighest education level: Not on file  Occupational History  . Occupation: retired  Tobacco Use  . Smoking status: Former Smoker    Types: Cigarettes    Quit date: 03/11/1985    Years since quitting: 34.0  . Smokeless tobacco: Never Used  Substance and Sexual Activity  . Alcohol use: No    Alcohol/week: 0.0 standard drinks  . Drug use: No  . Sexual activity: Not on file  Other Topics Concern  . Not on file  Social History Narrative   Retired. Married. [place]   Right handed   Social Determinants of Health   Financial Resource Strain:   . Difficulty of Paying Living Expenses: Not on file  Food Insecurity:   . Worried About Charity fundraiser in the Last Year: Not on file  . Ran Out of Food in the Last Year: Not on file  Transportation Needs:   . Lack of Transportation (Medical): Not on file  . Lack of Transportation (Non-Medical): Not on file  Physical Activity:   . Days of Exercise per Week: Not on file  . Minutes of Exercise per Session: Not on file  Stress:   . Feeling of Stress : Not on file  Social Connections:   . Frequency of Communication with Friends and Family: Not on file  . Frequency of Social Gatherings with Friends and Family: Not on file  . Attends Religious Services: Not on file  . Active Member of Clubs or Organizations: Not on file  . Attends Archivist Meetings: Not on file  . Marital Status: Not on file   Family History  Problem Relation Age of Onset  . Stroke Mother   . Hypertension Mother   . Stroke Father   . Hypertension Father   . Diabetes Father   . Coronary artery disease Father     . Heart disease Father   . Diabetes Sister   . Hypertension Sister   . Lung cancer Brother   . Sinusitis Brother   . Healthy Brother   . Diabetes Son   . Hypertension Son   . Coronary artery disease Other    Scheduled Meds: . collagenase  1 application Topical Daily  . feeding supplement (ENSURE ENLIVE)  237 mL Oral QID  . glycopyrrolate  0.4 mg Intravenous Q4H   Continuous Infusions: . sodium chloride 75 mL/hr at 03/18/19 1716  . ceFEPime (MAXIPIME) IV 2 g (03/19/19 0919)   PRN Meds:.acetaminophen **OR** acetaminophen, bisacodyl, clobetasol cream, ketotifen, LORazepam, methocarbamol, morphine injection, ondansetron **OR** ondansetron (ZOFRAN) IV, polyvinyl alcohol Medications Prior to Admission:  Prior to Admission medications   Medication Sig Start Date End Date Taking? Authorizing Provider  ACCU-CHEK AVIVA PLUS test strip TEST FOUR TIMES DAILY AS DIRECTED 08/04/18   Cassandria Anger, MD  Accu-Chek Softclix Lancets lancets USE ONE DAILY AS DIRECTED - THIS IS A 100 DAY SUPPLY 05/20/18   Nida, Marella Chimes, MD  allopurinol (ZYLOPRIM) 100 MG tablet Take 100 mg by mouth daily as needed (Gout).  01/19/19   [provider]  aspirin EC 81 MG tablet Take 81 mg by mouth daily.    [provider]  Boswellia-Glucosamine-Vit D (OSTEO BI-FLEX ONE PER DAY PO) Take 1 tablet by mouth every other day.     [provider]  Calcium Citrate 250 MG TABS Take 200 mg of elemental calcium by mouth daily.    [provider]  Carboxymeth-Glycerin-Polysorb (REFRESH OPTIVE ADVANCED) 0.5-1-0.5 % SOLN Apply 1 drop to eye daily as needed (for dry eyes).     [provider]  Cholecalciferol (VITAMIN D3) 50 MCG (2000 UT) TABS Take 2,000 Units by mouth daily.     [provider]  clobetasol (TEMOVATE) 0.05 % external solution Apply 1 application topically daily as needed (eczema).    [provider]  cloNIDine (CATAPRES) 0.1 MG tablet Take 0.2 mg  by mouth at bedtime.     [provider]  esomeprazole (NEXIUM) 20 MG capsule Take 20 mg by mouth daily before breakfast.     [provider]  fluticasone (CUTIVATE) 0.05 % cream Apply 1 application topically daily as needed (Eczema in ears).    [provider]  GLOBAL EASE INJECT PEN NEEDLES 32G X 4 MM MISC USE ONCE DAILY WITH SOLOSTAR INSULIN 09/01/18   Cassandria Anger, MD  hydrALAZINE (APRESOLINE) 25 MG tablet Take 25 mg by mouth at bedtime.     [provider]  insulin glargine (LANTUS) 100 UNIT/ML injection Inject 0.1 mLs (10 Units total) into the skin at bedtime. 03/02/19   Setzer, Edman Circle, PA-C  ketotifen (ALAWAY) 0.025 % ophthalmic solution Place 1 drop into both eyes daily as needed (for allergies).     [provider]  Astrid Drafts Omega-3 500 MG CAPS Take 1 tablet by mouth daily.     [provider]  levothyroxine (SYNTHROID) 75 MCG tablet TAKE 1 TABLET BY MOUTH EVERY DAY BEFORE BREAKFAST Patient taking differently: Take 50 mcg by mouth daily before breakfast.  11/18/18   Nida, Marella Chimes, MD  losartan (COZAAR) 100 MG tablet Take 100 mg by mouth daily.      [provider]  methocarbamol (ROBAXIN) 750 MG tablet Take 750 mg by mouth 2 (two) times daily as needed for muscle spasms.     [provider]  Misc Natural Products (TART CHERRY ADVANCED PO) Take 125 mLs by mouth daily. Juice    [provider]  Multiple Vitamins-Minerals (PRESERVISION AREDS 2 PO) Take 2 tablets by mouth daily.     [provider]  NIFEdipine (PROCARDIA-XL/ADALAT-CC/NIFEDICAL-XL) 30 MG 24 hr tablet Take 30 mg by mouth daily.    [provider]  oxyCODONE-acetaminophen (PERCOCET/ROXICET) 5-325 MG tablet Take 1 tablet by mouth every 6 (six) hours as needed for moderate pain.  01/25/19   [provider]  pregabalin (LYRICA) 25 MG capsule Take 25 mg by mouth 3 (three) times daily as needed (Pain).      [provider]  Propylene Glycol (SYSTANE BALANCE OP) Apply 1 drop to eye daily as needed (for dry eyes).     [provider]  simvastatin (ZOCOR) 10 MG tablet Take 10 mg by mouth every other day. In the evening    [provider]  SUPER B COMPLEX/C PO Take 1 tablet by mouth daily.     [provider]  torsemide (DEMADEX) 20 MG tablet Take 20 mg by mouth daily.     [provider]  traZODone (DESYREL) 100 MG tablet Take 100 mg by mouth at bedtime.    [provider]   Allergies  Allergen Reactions  . Byetta 10 Mcg Pen [Exenatide] Other (See Comments)    Unknown  . Ceclor [Cefaclor] Other (See Comments)    Unknown  . Celexa [Citalopram Hydrobromide] Other (See Comments)    Sick  . Ciprofloxacin Nausea Only and Other (See Comments)    Dizziness  . Claritin [Loratadine] Other (See Comments)    Hair fell out  . Erythromycin Other (See Comments)    REACTION: Upset stomach  . Flexeril [Cyclobenzaprine] Other (See Comments)    Nervous  . Fosamax [Alendronate] Other (See Comments)    Made pt sick  . Jardiance [Empagliflozin] Other (See Comments)    Vaginal infection, UTI  . Motrin [Ibuprofen] Nausea Only  . Naproxen Other (See Comments)    REACTION: severe nausea  . Norvasc [Amlodipine Besylate] Other (See  Comments)    Unknown  . Prednisone Other (See Comments)    REACTION: itchy rash  . Prozac [Fluoxetine Hcl] Other (See Comments)    Sick  . Sulfamethoxazole-Trimethoprim Nausea And Vomiting and Other (See Comments)    REACTION: Upset stomach   Review of Systems  Unable to perform ROS   Physical Exam Vitals and nursing note reviewed.  Constitutional:      Appearance: She is ill-appearing.  HENT:     Head: Normocephalic.     Nose: Nose normal.     Mouth/Throat:     Mouth: Mucous membranes are dry.  Eyes:     Conjunctiva/sclera: Conjunctivae normal.     Pupils: Pupils are equal, round, and reactive to light.    Cardiovascular:     Rate and Rhythm: Rhythm irregular.     Pulses: Normal pulses.  Pulmonary:     Effort: Pulmonary effort is normal.  Abdominal:     General: Abdomen is flat.  Musculoskeletal:     Cervical back: Tenderness present.     Comments: RLE amputation  Skin:    General: Skin is warm.     Capillary Refill: Capillary refill takes less than 2 seconds.     Coloration: Skin is pale.     Findings: Bruising present.          Vital Signs: BP (!) 181/65 (BP Location: Left Arm)   Pulse (!) 116   Temp 97.8 F (36.6 C) (Oral)   Resp 20   Ht 5' 2.99" (1.6 m)   Wt 77.1 kg   LMP  (LMP Unknown)   SpO2 96%   BMI 30.12 kg/m  Pain Scale: PAINAD   Pain Score: Asleep  SpO2: SpO2: 96 % O2 Device:SpO2: 96 % O2 Flow Rate: .O2 Flow Rate (L/min): 6 L/min  IO: Intake/output summary:   Intake/Output Summary (Last 24 hours) at 03/19/2019 1017 Last data filed at 03/18/2019 2100 Gross per 24 hour  Intake 919.59 ml  Output 1500 ml  Net -580.41 ml   LBM: Last BM Date: 03/15/19 Baseline Weight: Weight: 77.1 kg Most recent weight: Weight: 77.1 kg     Palliative Assessment/Data: 20%   Time In: 0900 Time Out: 1015 Time Total: 75 Greater than 50%  of this time was spent counseling and coordinating care related to the above assessment and plan.  Signed by: Rosezella Rumpf, NP   Please contact Palliative Medicine Team phone at [phone] for questions and concerns.  For individual provider: See Shea Evans

## 2019-03-19 NOTE — Progress Notes (Signed)
Brief Hematology Note:  Chart and labs have been reviewed. She has persistent pancytopenia that has slightly worsened. Likely has bone marrow suppression from sepsis, IV antibiotic use, and recent surgery. Recommend daily CBC. Transfuse PRBC for hemoglobin <7.0 or platelets for platelet count <20K or active bleeding.   CBC    Component Value Date/Time   WBC 1.3 (LL) 03/19/2019 0306   RBC 2.75 (L) 03/19/2019 0306   HGB 8.4 (L) 03/19/2019 0306   HCT 26.0 (L) 03/19/2019 0306   PLT 37 (L) 03/19/2019 0306   MCV 94.5 03/19/2019 0306   MCH 30.5 03/19/2019 0306   MCHC 32.3 03/19/2019 0306   RDW 14.5 03/19/2019 0306   LYMPHSABS 0.4 (L) 03/15/2019 2300   MONOABS 0.3 03/15/2019 2300   EOSABS 0.0 03/15/2019 2300   BASOSABS 0.0 03/15/2019 2300    Please call hematology over the weekend for questions. Will check on her on Monday 03/22/2019.   Mikey Bussing, DNP, AGPCNP-BC, AOCNP

## 2019-03-19 NOTE — Progress Notes (Signed)
PROGRESS NOTE  Debra Hopkins [id number] DOB: 04-Jun-1940 DOA: 03/15/2019 PCP: Monico Blitz, MD   LOS: 4 days   Brief narrative: As per HPI,  Debra Hopkins is a 79 y.o. female with medical history significant of history of CAD, diabetes mellitus, TIA, left CEA 2008, fibromyalgia, macular degeneration, peripheral vascular disease with multiple revascularization procedures and maintained on low-dose aspirin presented on 02/15/2019 with chronic RLE ischemic changes and toe ulceration. Recent aortogram showed aortic and iliac segments heavily calcified. 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 [name] on 02/15/2019. Noted postoperatively mottling of the right foot started on heparin drip. 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 Donnetta Hutching. Hospital course further complicated by acute blood loss anemia patient was transfused 2 units with latest hemoglobin 9.5. Subcutaneous heparin was initiated for DVT prophylaxis 02/23/2019 as well as remaining on low-dose aspirin. Vancomycin resumed 03/01/2023 wound coverage.  Patient admitted to acute rehab where she  developed fever chills and was transferred to medical service for sepsis.  Patient is being followed by vascular surgery with a wound VAC and dressing.  Antibiotics was broadened and she was transferred to acute care for treatment of sepsis probably secondary to infected right groin wound.  Assessment/Plan:  Principal Problem:   Sepsis ([place]) Active Problems:   Mixed hyperlipidemia   Essential hypertension, benign   Coronary atherosclerosis   PVD   Hypothyroidism   GERD (gastroesophageal reflux disease)   Type 2 diabetes mellitus with stage 3 chronic  kidney disease, without long-term current use of insulin (HCC)   S/P AKA (above knee amputation) unilateral, right (HCC)   Myelofibrosis (HCC)  Somnolence, lethargy likely metabolic encephalopathy from sepsis.  Sepsis secondary to right groin wound infection. On  cefepime.  Vascular surgery on board.  Blood cultures negative so far.  Wound culture with klebsiella and enterobacter.  Status post debridement by vascular surgery 03/17/19.  Will follow vascular surgery recommendations.  T-max of 97.8 F at this time.  Status post  right AKA with debridement of the right groin with a sartorius muscle flap in the past. Further debridement by vascular surgery 03/17/2019.    History of coronary artery disease.  No acute issues.     History of GERD-on comfort care.  Hypothyroidism -was on Synthroid.  Discontinued for comfort care  Essential hypertension.  On  clonidine, hydralazine, losartan at home.  Discontinued for comfort care..   Pancytopenia, neutropenia likely from  from sepsis.    History of myelofibrosis.. Hematooncology was consulted.  G-CSF is not recommended at this time.  Oncology recommended continue broad-spectrum antibiotic at this time.  . Of note,  patient follows up with Dr Federico Flake, Hematologist  at [facility].   Palliative care has seen the patient today and palliative care team had a prolonged discussion with the family regarding goals of care.  At this time, comfort care has been initiated. Spoke with palliative care team as well.  Concern for difficulty swallowing as per nursing staff.  Appreciated speech and swallow-on dysphagia 1 diet.  CKD stage IIIa.   On presentation.  VTE Prophylaxis: Lovenox discontinued for comfort care.  Code Status: DNR  Family Communication: Palliative care had a prolonged discussion with the family regarding goals  of care  Disposition Plan: At this time comfort care discussion has been initiated by palliative care  team.   Consultants:  Vascular surgery   hematology  Procedures:  Surgical debridement 03/17/19  Antibiotics:  Anti-infectives (From admission, onward)   Start     Dose/Rate Route Frequency Ordered Stop   03/17/19 2200  vancomycin (VANCOREADY) IVPB 1750 mg/350 mL  Status:  Discontinued     1,750 mg 175 mL/hr over 120 Minutes Intravenous Every 48 hours 03/16/19 0034 03/17/19 1444   03/16/19 1000  ceFEPIme (MAXIPIME) 2 g in sodium chloride 0.9 % 100 mL IVPB     2 g 200 mL/hr over 30 Minutes Intravenous 2 times daily 03/16/19 0034       Subjective:  Today, patient was extremely somnolent and unable to verbalize.  On mittens.  Intermittently agitated  Objective: Vitals:   03/18/19 2254 03/19/19 0902  BP: (!) 171/61 (!) 181/65  Pulse: (!) 116   Resp: 20   Temp: 98.1 F (36.7 C) 97.8 F (36.6 C)  SpO2: 96%     Intake/Output Summary (Last 24 hours) at 03/19/2019 1114 Last data filed at 03/18/2019 2100 Gross per 24 hour  Intake 919.59 ml  Output 1500 ml  Net -580.41 ml   Filed Weights   03/16/19 0300 03/17/19 0940  Weight: 77.1 kg 77.1 kg   Body mass index is 30.12 kg/m.   Physical Exam: GENERAL: Patient somnolent, intermittently agitated on mittens.  Communicative.  Not in obvious distress.  On mittens HENT: No scleral pallor or icterus. Pupils equally reactive to light.  NECK: is supple, no palpable thyroid enlargement. CHEST: Clear to auscultation. Diminished breath sounds bilaterally.  Right chest wall Port-A-Cath in place. CVS: S1 and S2 heard, no murmur.  Mild tachycardia.  No pericardial rub. ABDOMEN: Soft, non-tender, bowel sounds are present. EXTREMITIES:  Right groin with dressing. Status post right above knee amputation. CNS: Somnolent, nonverbal.  Intermittently agitated. SKIN: Status post right above-knee amputation, right groin with dressing.  Data Review: I have personally reviewed the following laboratory data and studies,  CBC: Recent Labs  Lab  March 19, 2019 0439 03/14/19 0327 03/15/19 0544 03/15/19 2300 03/16/19 0508 03/17/19 0818 03/19/19 0306  WBC 2.4* 1.6* 1.8* 1.5* 1.7* 1.4* 1.3*  NEUTROABS 1.3* 0.8* 1.3* 0.8*  --   --   --   HGB 7.4* 6.9* 8.8* 8.9* 9.2* 9.6* 8.4*  HCT 23.3* 21.6* 26.5* 27.6* 27.9* 29.7* 26.0*  MCV 95.1 96.4 92.0 93.6 91.2 94.0 94.5  PLT 88* 79* 74* 72* 75* 65* 37*   Basic Metabolic Panel: Recent Labs  Lab 03/14/19 0327 03/15/19 0544 03/16/19 0508 03/17/19 0818 03/19/19 0306  NA 142 144 144 147* 156*  K 4.3 4.0 4.0 3.5 3.1*  CL 109 110 112* 118* 126*  CO2 24 25 21* 19* 21*  GLUCOSE 198* 170* 148* 117* 123*  BUN 66* 53* 41* 26* 31*  CREATININE 1.14* 1.06* 0.98 0.81 0.79  CALCIUM 8.9 8.9 9.3 9.2 9.2  MG  --   --   --  2.1 2.0  PHOS  --   --   --   --  2.3*   Liver Function Tests: Recent Labs  Lab 03/15/19 0544 03/16/19 0508 03/19/19 0306  AST 23 17 20   ALT 15 12 20   ALKPHOS 60 62 44  BILITOT 0.4 0.5 0.4  PROT 4.5* 4.9* 4.6*  ALBUMIN 1.6* 1.8* 1.8*   No results for input(s): LIPASE, AMYLASE in the last 168 hours. No results  for input(s): AMMONIA in the last 168 hours. Cardiac Enzymes: No results for input(s): CKTOTAL, CKMB, CKMBINDEX, TROPONINI in the last 168 hours. BNP (last 3 results) No results for input(s): BNP in the last 8760 hours.  ProBNP (last 3 results) No results for input(s): PROBNP in the last 8760 hours.  CBG: Recent Labs  Lab 03/18/19 0804 03/18/19 1138 03/18/19 1630 03/18/19 2108 03/19/19 0904  GLUCAP 171* 173* 192* 154* 110*   Recent Results (from the past 240 hour(s))  Culture, Urine     Status: None   Collection Time: 03/14/19  2:53 PM   Specimen: Urine, Catheterized  Result Value Ref [place] Status   Specimen Description URINE, CATHETERIZED  Final   Special Requests Normal  Final   Culture   Final    NO GROWTH Performed at [facility], [place] [address], [place], [place] [address]    Report Status 03/16/2019 FINAL  Final  Aerobic Culture  (superficial specimen)     Status: None   Collection Time: 03/15/19  4:05 PM   Specimen: Groin  Result Value Ref [place] Status   Specimen Description GROIN RIGHT  Final   Special Requests NONE  Final   Gram Stain   Final    NO WBC SEEN MODERATE GRAM VARIABLE ROD RARE GRAM POSITIVE COCCI IN PAIRS    Culture   Final    ABUNDANT KLEBSIELLA OXYTOCA ABUNDANT ENTEROBACTER AEROGENES MODERATE EIKENELLA CORRODENS Usually susceptible to penicillin and other beta lactam agents,quinolones,macrolides and tetracyclines. Performed at [facility], [place] [address] [place], [place] [address]    Report Status 03/18/2019 FINAL  Final   Organism ID, Bacteria KLEBSIELLA OXYTOCA  Final   Organism ID, Bacteria ENTEROBACTER AEROGENES  Final      Susceptibility   Enterobacter aerogenes - MIC*    CEFAZOLIN RESISTANT Resistant     CEFEPIME <=0.12 SENSITIVE Sensitive     CEFTAZIDIME <=1 SENSITIVE Sensitive     CEFTRIAXONE <=0.25 SENSITIVE Sensitive     CIPROFLOXACIN <=0.25 SENSITIVE Sensitive     GENTAMICIN <=1 SENSITIVE Sensitive     IMIPENEM <=0.25 SENSITIVE Sensitive     TRIMETH/SULFA <=20 SENSITIVE Sensitive     PIP/TAZO <=4 SENSITIVE Sensitive     * ABUNDANT ENTEROBACTER AEROGENES   Klebsiella oxytoca - MIC*    AMPICILLIN >=32 RESISTANT Resistant     CEFAZOLIN 16 SENSITIVE Sensitive     CEFEPIME <=0.12 SENSITIVE Sensitive     CEFTAZIDIME <=1 SENSITIVE Sensitive     CEFTRIAXONE <=0.25 SENSITIVE Sensitive     CIPROFLOXACIN <=0.25 SENSITIVE Sensitive     GENTAMICIN <=1 SENSITIVE Sensitive     IMIPENEM <=0.25 SENSITIVE Sensitive     TRIMETH/SULFA <=20 SENSITIVE Sensitive     AMPICILLIN/SULBACTAM 16 INTERMEDIATE Intermediate     PIP/TAZO <=4 SENSITIVE Sensitive     * ABUNDANT KLEBSIELLA OXYTOCA  Blood culture , two sites.     Status: None (Preliminary result)   Collection Time: 03/15/19  6:42 PM   Specimen: BLOOD  Result Value Ref [place] Status   Specimen Description BLOOD LEFT ANTECUBITAL   Final   Special Requests   Final    BOTTLES DRAWN AEROBIC AND ANAEROBIC Blood Culture adequate volume   Culture   Final    NO GROWTH 4 DAYS Performed at [facility], [place] [address], [place],  [address]    Report Status PENDING  Incomplete  Culture, blood (routine x 2)     Status: None (Preliminary result)  Collection Time: 03/15/19  6:55 PM   Specimen: BLOOD  Result Value Ref [place] Status   Specimen Description BLOOD LEFT ANTECUBITAL  Final   Special Requests   Final    BOTTLES DRAWN AEROBIC ONLY Blood Culture adequate volume   Culture   Final    NO GROWTH 4 DAYS Performed at [facility], [address] [place], [place] [address]    Report Status PENDING  Incomplete     Studies: No results found.  Scheduled Meds: . collagenase  1 application Topical Daily  . feeding supplement (ENSURE ENLIVE)  237 mL Oral QID  . glycopyrrolate  0.4 mg Intravenous Q4H    Continuous Infusions: . ceFEPime (MAXIPIME) IV 2 g (03/19/19 0919)     Flora Lipps, MD  [facility] 03/19/2019

## 2019-03-19 NOTE — Progress Notes (Signed)
   03/19/19 1145  Clinical Encounter Type  Visited With Patient;Health care provider  Visit Type  (End of Life)  Referral From Nurse  Consult/Referral To [place] responded to consult for end of life support. Chaplain sat with Maelene for about 20 minutes and prayed over her. Chaplain informed RN that Eletha had made some moaning noises. Rodneshia was also reaching out as if she were trying to grasp something. Jade was a bit restless. But otherwise calm. Chaplain remains available for support as needs arise.   Chaplain Resident, Evelene Croon, [place] [phone]

## 2019-03-19 NOTE — Progress Notes (Signed)
   I change the wound at bedside appears to be hemostatic without any ongoing infection.  I have discussed with palliative care plans for hospice.  I will reach out to son today.  Niala Stcharles C. [name], MD [facility] Office: [phone] Pager: [phone]

## 2019-03-19 NOTE — Progress Notes (Signed)
  Speech Language Pathology Treatment: Dysphagia  Patient Details Name: Debra Hopkins MRN: [id number] DOB: [birth year] Today's Date: 03/19/2019 Time: [id number] SLP Time Calculation (min) (ACUTE ONLY): 16 min  Assessment / Plan / Recommendation Clinical Impression  Patient is bilateral mittens. She was repositioned upright in bed for breakfast. Patient tolerated Dys 1, thin liquid diet with no overt s/s of aspiration. Recommend continuation of current diet. ST will defer to next level of care for diet tolerance. No further ST services needed at this level of care.    HPI HPI: [name] is a 79 year old female with a past medical history significant for CAD, diabetes mellitus, TIA, fibromyalgia, macular degeneration, peripheral vascular disease, myelofibrosis followed by medical oncology at [facility]. The patient was sent from the acute rehabilitation center secondary to sepsis. 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      SLP Plan  (defer to SLP at next level of care for diet advancement)       Recommendations  Diet recommendations: Dysphagia 1 (puree);Thin liquid Liquids provided via: Straw;Cup Medication Administration: Crushed with puree Supervision: Staff to assist with self feeding;Full supervision/cueing for compensatory strategies Compensations: Slow rate;Small sips/bites;Lingual sweep for clearance of pocketing Postural Changes and/or Swallow Maneuvers: Seated upright 90 degrees                Oral Care Recommendations: Oral care BID Follow up Recommendations: Skilled Nursing facility SLP Visit Diagnosis: Dysphagia, unspecified (R13.10) Plan: (defer to SLP at next level of care for  diet advancement)       GO                Wynelle Bourgeois., MA, CCC-SLP 03/19/2019, 9:16 AM

## 2019-03-20 LAB — CULTURE, BLOOD (SINGLE)
Culture: NO GROWTH
Special Requests: ADEQUATE

## 2019-03-20 LAB — SARS CORONAVIRUS 2 (TAT 6-24 HRS): SARS Coronavirus 2: NEGATIVE

## 2019-03-20 LAB — CULTURE, BLOOD (ROUTINE X 2)
Culture: NO GROWTH
Special Requests: ADEQUATE

## 2019-03-20 NOTE — Progress Notes (Signed)
Pt has rested comfortably in bed today. Received a bath and bed change this morning. Has received 2 doses of PRN Morphine 2mg  through her port during this shift. COVID swab for dc was sent today. Once swab results, pt will be dc'd to [facility].

## 2019-03-20 NOTE — Plan of Care (Signed)
  Problem: Clinical Measurements: Goal: Ability to maintain clinical measurements within normal limits will improve Outcome: Progressing   Problem: Clinical Measurements: Goal: Respiratory complications will improve Outcome: Progressing   Problem: Clinical Measurements: Goal: Cardiovascular complication will be avoided Outcome: Progressing   

## 2019-03-20 NOTE — Progress Notes (Signed)
PROGRESS NOTE  Debra Hopkins [id number] DOB: [birth year] DOA: 03/15/2019 PCP: Monico Blitz, MD   LOS: 5 days   Brief narrative: As per HPI,  Debra Hopkins is a 79 y.o. female with medical history significant of history of CAD, diabetes mellitus, TIA, left CEA 2008, fibromyalgia, macular degeneration, peripheral vascular disease with multiple revascularization procedures and maintained on low-dose aspirin presented on 02/15/2019 with chronic RLE ischemic changes and toe ulceration. Recent aortogram showed aortic and iliac segments heavily calcified. 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 [name] on 02/15/2019. Noted postoperatively mottling of the right foot started on heparin drip. 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 Donnetta Hutching. Hospital course further complicated by acute blood loss anemia patient was transfused 2 units with latest hemoglobin 9.5. Subcutaneous heparin was initiated for DVT prophylaxis 02/23/2019 as well as remaining on low-dose aspirin. Vancomycin resumed 03/01/2019 wound coverage.  Patient admitted to acute rehab where she  developed fever chills and was transferred to medical service for sepsis.  Patient is being followed by vascular surgery with a wound VAC and dressing.  Antibiotics was broadened and she was transferred to acute care for treatment of sepsis probably secondary to infected right groin wound.   03/20/2019: Patient seen.  Available records reviewed.  Patient has been made comfort care only.  No new changes.  Patient remains comfortable.  Will pursue disposition once all necessary arrangements have been made.  Assessment/Plan:  Principal Problem:   Sepsis  ([place]) Active Problems:   Mixed hyperlipidemia   Essential hypertension, benign   Coronary atherosclerosis   PVD   Hypothyroidism   GERD (gastroesophageal reflux disease)   Type 2 diabetes mellitus with stage 3 chronic kidney disease, without long-term current use of insulin (HCC)   S/P AKA (above knee amputation) unilateral, right ([place])   Myelofibrosis ([place])   Palliative care by specialist   Goals of care, counseling/discussion   DNR (do not resuscitate)   Advanced care planning/counseling discussion   Terminal care  Sepsis secondary to right groin wound infection. On  cefepime.  Vascular surgery on board.  Blood cultures negative so far.  Wound culture with klebsiella and enterobacter.  Status post debridement by vascular surgery 03/17/19.  Will follow vascular surgery recommendations.  T-max of 98.2 F at this time. 03/20/2019: Patient is now comfort measures only.  Status post  right AKA with debridement of the right groin with a sartorius muscle flap in the past. Further debridement by vascular surgery 03/17/2019.  Follow vascular surgery recommendations. 11/29/2019: Patient is now comfort measures only.  History of coronary artery disease.  No acute issues.     History of GERD- continue PPI  Hypothyroidism -continue Synthroid  Essential hypertension: 03/20/2019: Patient is now comfort measures only.  Pancytopenia, neutropenia likely from  from sepsis at this time    History of myelofibrosis.. Hematooncology was consulted.  G-CSF is not recommended at this time.  Oncology recommended Continue broad-spectrum antibiotic at this time.  Closely monitor WBC. Of note,  patient follows up with Dr Federico Flake, Hematologist  at [facility].   Will transfuse for hemoglobin less than 7 and platelet transfusion for less than 20,000.  Palliative care has been consulted for goals of care  discussion. 03/20/2019: Patient is now comfort measures only.  Concern for difficulty swallowing as per nursing  staff.  Appreciated speech and swallow- on liquid diet at this time. 03/20/2019: Patient is now comfort measures only.  CKD stage IIIa: We will continue to monitor BMP.  Check BMP in a.m. creatinine of 0.8. 03/20/2019: Comfort measures only  Code Status: DO NOT RESUSCITATE.    Family Communication:  None today  Disposition Plan: Comfort measures.  Hospice.   Consultants:  Vascular surgery   hematology  Procedures:  Surgical debridement 03/17/19  Antibiotics:  Anti-infectives (From admission, onward)   Start     Dose/Rate Route Frequency Ordered Stop   03/17/19 2200  vancomycin (VANCOREADY) IVPB 1750 mg/350 mL  Status:  Discontinued     1,750 mg 175 mL/hr over 120 Minutes Intravenous Every 48 hours 03/16/19 0034 03/17/19 1444   03/16/19 1000  ceFEPIme (MAXIPIME) 2 g in sodium chloride 0.9 % 100 mL IVPB  Status:  Discontinued     2 g 200 mL/hr over 30 Minutes Intravenous 2 times daily 03/16/19 0034 03/20/19 0938     Subjective: No history from patient.    Objective: Vitals:   03/19/19 2334 03/20/19 0420  BP: (!) 170/85 (!) 186/65  Pulse: (!) 110 95  Resp: 20 16  Temp: 98 F (36.7 C) 97.6 F (36.4 C)  SpO2: 96% 99%    Intake/Output Summary (Last 24 hours) at 03/20/2019 0958 Last data filed at 03/19/2019 2300 Gross per 24 hour  Intake --  Output 2200 ml  Net -2200 ml   Filed Weights   03/16/19 0300 03/17/19 0940  Weight: 77.1 kg 77.1 kg   Body mass index is 30.12 kg/m.   Physical Exam: GENERAL: Patient is not in any distress.  Patient is awake.  Patient is not responding to any questions of asked.   HENT: Pallor.  No jaundice.   NECK: Prominent neck veins CHEST: Clear to auscultation. No crackles or wheezes. Non tender on palpation. Diminished breath sounds bilaterally.  Right chest wall Port-A-Cath in place. CVS: S1 and S2 heard ABDOMEN: Obese, soft, non-tender, bowel sounds are present. EXTREMITIES: Right above-knee amputation.  No edema left lower  extremity.    Data Review: I have personally reviewed the following laboratory data and studies,  CBC: Recent Labs  Lab 03/14/19 0327 03/15/19 0544 03/15/19 2300 03/16/19 0508 03/17/19 0818 03/19/19 0306  WBC 1.6* 1.8* 1.5* 1.7* 1.4* 1.3*  NEUTROABS 0.8* 1.3* 0.8*  --   --   --   HGB 6.9* 8.8* 8.9* 9.2* 9.6* 8.4*  HCT 21.6* 26.5* 27.6* 27.9* 29.7* 26.0*  MCV 96.4 92.0 93.6 91.2 94.0 94.5  PLT 79* 74* 72* 75* 65* 37*   Basic Metabolic Panel: Recent Labs  Lab 03/14/19 0327 03/15/19 0544 03/16/19 0508 03/17/19 0818 03/19/19 0306  NA 142 144 144 147* 156*  K 4.3 4.0 4.0 3.5 3.1*  CL 109 110 112* 118* 126*  CO2 24 25 21* 19* 21*  GLUCOSE 198* 170* 148* 117* 123*  BUN 66* 53* 41* 26* 31*  CREATININE 1.14* 1.06* 0.98 0.81 0.79  CALCIUM 8.9 8.9 9.3 9.2 9.2  MG  --   --   --  2.1 2.0  PHOS  --   --   --   --  2.3*   Liver Function Tests: Recent Labs  Lab 03/15/19 0544 03/16/19 0508 03/19/19 0306  AST 23 17 20   ALT 15 12 20   ALKPHOS 60 62 44  BILITOT 0.4  0.5 0.4  PROT 4.5* 4.9* 4.6*  ALBUMIN 1.6* 1.8* 1.8*   No results for input(s): LIPASE, AMYLASE in the last 168 hours. No results for input(s): AMMONIA in the last 168 hours. Cardiac Enzymes: No results for input(s): CKTOTAL, CKMB, CKMBINDEX, TROPONINI in the last 168 hours. BNP (last 3 results) No results for input(s): BNP in the last 8760 hours.  ProBNP (last 3 results) No results for input(s): PROBNP in the last 8760 hours.  CBG: Recent Labs  Lab 03/18/19 1630 03/18/19 2108 03/19/19 0904 03/19/19 1223 03/19/19 1740  GLUCAP 192* 154* 110* 98 98   Recent Results (from the past 240 hour(s))  Culture, Urine     Status: None   Collection Time: 03/14/19  2:53 PM   Specimen: Urine, Catheterized  Result Value Ref Range Status   Specimen Description URINE, CATHETERIZED  Final   Special Requests Normal  Final   Culture   Final    NO GROWTH Performed at [facility], [address]  [place], [place] [address]    Report Status 03/16/2019 FINAL  Final  Aerobic Culture (superficial specimen)     Status: None   Collection Time: 03/15/19  4:05 PM   Specimen: Groin  Result Value Ref Range Status   Specimen Description GROIN RIGHT  Final   Special Requests NONE  Final   Gram Stain   Final    NO WBC SEEN MODERATE GRAM VARIABLE ROD RARE GRAM POSITIVE COCCI IN PAIRS    Culture   Final    ABUNDANT KLEBSIELLA OXYTOCA ABUNDANT ENTEROBACTER AEROGENES MODERATE EIKENELLA CORRODENS Usually susceptible to penicillin and other beta lactam agents,quinolones,macrolides and tetracyclines. Performed at [facility], [place] [address] [place], [place] [address]    Report Status 03/18/2019 FINAL  Final   Organism ID, Bacteria KLEBSIELLA OXYTOCA  Final   Organism ID, Bacteria ENTEROBACTER AEROGENES  Final      Susceptibility   Enterobacter aerogenes - MIC*    CEFAZOLIN RESISTANT Resistant     CEFEPIME <=0.12 SENSITIVE Sensitive     CEFTAZIDIME <=1 SENSITIVE Sensitive     CEFTRIAXONE <=0.25 SENSITIVE Sensitive     CIPROFLOXACIN <=0.25 SENSITIVE Sensitive     GENTAMICIN <=1 SENSITIVE Sensitive     IMIPENEM <=0.25 SENSITIVE Sensitive     TRIMETH/SULFA <=20 SENSITIVE Sensitive     PIP/TAZO <=4 SENSITIVE Sensitive     * ABUNDANT ENTEROBACTER AEROGENES   Klebsiella oxytoca - MIC*    AMPICILLIN >=32 RESISTANT Resistant     CEFAZOLIN 16 SENSITIVE Sensitive     CEFEPIME <=0.12 SENSITIVE Sensitive     CEFTAZIDIME <=1 SENSITIVE Sensitive     CEFTRIAXONE <=0.25 SENSITIVE Sensitive     CIPROFLOXACIN <=0.25 SENSITIVE Sensitive     GENTAMICIN <=1 SENSITIVE Sensitive     IMIPENEM <=0.25 SENSITIVE Sensitive     TRIMETH/SULFA <=20 SENSITIVE Sensitive     AMPICILLIN/SULBACTAM 16 INTERMEDIATE Intermediate     PIP/TAZO <=4 SENSITIVE Sensitive     * ABUNDANT KLEBSIELLA OXYTOCA  Blood culture , two sites.     Status: None   Collection Time: 03/15/19  6:42 PM   Specimen: BLOOD  Result Value  Ref Range Status   Specimen Description BLOOD LEFT ANTECUBITAL  Final   Special Requests   Final    BOTTLES DRAWN AEROBIC AND ANAEROBIC Blood Culture adequate volume   Culture   Final    NO GROWTH 5 DAYS Performed at [facility], [address], [place], [place]  [id number]    Report Status 03/20/2019 FINAL  Final  Culture, blood (routine x 2)     Status: None   Collection Time: 03/15/19  6:55 PM   Specimen: BLOOD  Result Value Ref Range Status   Specimen Description BLOOD LEFT ANTECUBITAL  Final   Special Requests   Final    BOTTLES DRAWN AEROBIC ONLY Blood Culture adequate volume   Culture   Final    NO GROWTH 5 DAYS Performed at [facility], [place] [address] [place], [place] [address]    Report Status 03/20/2019 FINAL  Final     Studies: No results found.  Scheduled Meds: . collagenase  1 application Topical Daily  . feeding supplement (ENSURE ENLIVE)  237 mL Oral QID  . glycopyrrolate  0.4 mg Intravenous Q4H    Continuous Infusions:    Bonnell Public, MD  [facility] 03/20/2019

## 2019-03-20 NOTE — Progress Notes (Signed)
Airline pilot received referral from [employer] with PMT. At this time, it appears pt will dc to [facility].   Thank you for the referral.  Freddie Breech, RN [facility] Liaison [phone]

## 2019-03-20 NOTE — Care Management (Addendum)
10:50 Spoke w patient's son Debra Hopkins. Discussed DC options. He lives out of town until 04/01/19 and would prefere [facility] w receptionist who will have the intake RN call me back.  11:10 [facility] has a bed. They state they need a negative COVID. Spoke w Palliative NP who will order one. Updated the son, Debra Hopkins.  H&P, palliative note and facesheet faxed to [phone]

## 2019-03-20 NOTE — Progress Notes (Signed)
Palliative Medicine Inpatient Follow Up Note  HPI: Per intake H&P --> Debra Hopkins a 79 y.o.femalewith medical history significant ofhistory of CAD, diabetes mellitus, TIA, left CEA 2008, fibromyalgia, macular degeneration, peripheral vascular disease with multiple revascularization procedures and maintained on low-dose aspirin. History taken from chart review and patient. Presented on 02/15/2019 with chronic RLE ischemic changes and toe ulceration. Recent aortogram showed aortic and iliac segments heavily calcified. 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 [name] on 02/15/2019. Noted postoperatively mottling of the right foot started on heparin drip. 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 Donnetta Hutching. Hospital course further complicated by acute blood loss anemia patient was transfused 2 units with latest hemoglobin 9.5. Subcutaneous heparin was initiated for DVT prophylaxis 02/23/2019 as well as remaining on low-dose aspirin. Vancomycin resumed 03/01/2023 wound coverage.Patient admitted to acute rehab where she has now developed fever chills and generally septic. She is being followed by vascular surgery with a wound VAC and dressing. Antibiotics was broadened and she is transferred back to acute care for treatment of sepsis probably infected right groin wound.  Yesterday was transitioned to comfort oriented care.   Today's Discussion (03/20/2019): Chart reviewed. Patient evaluated, appears far less agitated today. Per discussion with bedside RN, [name] she got three doses of morphine yesterday and one overnight with good symptom control.  Spoke to patients son, Debra Hopkins who said that he is  grateful that the patients comfort is being optimized. We today discussed stopping her antibiotics as they are likely not of benefit if our focus is truly comfort. We discussed that in many cases antibiotics can be life prolonging at this stage. He agreed with stopping them this morning.  Spoke to case management about home with hospice or hospice bed in [facility]. Son will be reached out to.   Discussed with patient the importance of continued conversation with family and their  medical providers regarding overall plan of care and treatment options, ensuring decisions are within the context of the patients values and GOCs.  Questions and concerns addressed   Subjective Assessment: Patient able to open eyes. Not having any nonverbal indicators of pain or dyspnea.  Vital Signs Vitals:   03/19/19 2334 03/20/19 0420  BP: (!) 170/85 (!) 186/65  Pulse: (!) 110 95  Resp: 20 16  Temp: 98 F (36.7 C) 97.6 F (36.4 C)  SpO2: 96% 99%    Intake/Output Summary (Last 24 hours) at 03/20/2019 0932 Last data filed at 03/19/2019 2300 Gross per 24 hour  Intake --  Output 2200 ml  Net -2200 ml   Last Weight  Most recent update: 03/17/2019  9:40 AM   Weight  77.1 kg (170 lb)           Gen:  Elderly F in NAD HEENT: dry mucous membranes CV: Irregular rate and rhythm  PULM: diminished to auscultation bilaterally  ABD: soft/nontender  EXT: No edema  Neuro: Opens eyes  Recommendations and Plan:  Prognosis is poor, days to weeks in the setting of no PO intake.   Pain, in the setting of debility:                 - Morphine 1-2mg  IV Q1H PRN                  -  Methocarbomol 750mg  PO Bid, while able to take orals                 - Heat Pack PRN Agitation:                 - Lorazepam 0.5mg -1.g IV Q1H Secretions:                 - Glycopyrrolate 0.4mg  IV Q4H ATC Dry Eyes:                 - Artificial Tears PRN Constipation:                 - Bisacodyl 10mg  PR Wound Care:                  - Turn Q2H                 - Santyl as ordered                 - Daily dressing changes                 - Lift heels  Discussed with Dr. Marthenia Rolling  Time In: 0900 Time Out: 0925 Time Spent:  25 Greater than 50% of the time was spent in counseling and coordination of care ______________________________________________________________________________________ [place] Team Team Cell Phone: [phone] Please utilize secure chat with additional questions, if there is no response within 30 minutes please call the above phone number  Palliative Medicine Team providers are available by phone from 7am to 7pm daily and can be reached through the team cell phone.  Should this patient require assistance outside of these hours, please call the patient's attending physician.

## 2019-03-21 LAB — GLUCOSE, CAPILLARY: Glucose-Capillary: 142 mg/dL — ABNORMAL HIGH (ref 70–99)

## 2019-03-21 NOTE — Progress Notes (Signed)
PROGRESS NOTE  Debra Hopkins [id number] DOB: [birth year] DOA: 03/15/2019 PCP: Monico Blitz, MD   LOS: 6 days   Brief narrative: As per HPI,  Debra Hopkins is a 79 y.o. female with medical history significant of history of CAD, diabetes mellitus, TIA, left CEA 2008, fibromyalgia, macular degeneration, peripheral vascular disease with multiple revascularization procedures and maintained on low-dose aspirin presented on 02/15/2019 with chronic RLE ischemic changes and toe ulceration. Recent aortogram showed aortic and iliac segments heavily calcified. 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 [name] on 02/15/2019. Noted postoperatively mottling of the right foot started on heparin drip. 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 Donnetta Hutching. Hospital course further complicated by acute blood loss anemia patient was transfused 2 units with latest hemoglobin 9.5. Subcutaneous heparin was initiated for DVT prophylaxis 02/23/2019 as well as remaining on low-dose aspirin. Vancomycin resumed 03/01/2019 wound coverage.  Patient admitted to acute rehab where she  developed fever chills and was transferred to medical service for sepsis.  Patient is being followed by vascular surgery with a wound VAC and dressing.  Antibiotics was broadened and she was transferred to acute care for treatment of sepsis probably secondary to infected right groin wound.   03/20/2019: Patient seen.  Available records reviewed.  Patient has been made comfort care only.  No new changes.  Patient remains comfortable.  Will pursue disposition once all necessary arrangements have been made.  03/21/2019: Patient seen.  Patient remains comfortable.   Assessment/Plan:  Principal Problem:   Sepsis ([place]) Active Problems:   Mixed hyperlipidemia   Essential hypertension, benign   Coronary atherosclerosis   PVD   Hypothyroidism   GERD (gastroesophageal reflux disease)   Type 2 diabetes mellitus with stage 3 chronic kidney disease, without long-term current use of insulin (HCC)   S/P AKA (above knee amputation) unilateral, right ([place])   Myelofibrosis ([place])   Palliative care by specialist   Goals of care, counseling/discussion   DNR (do not resuscitate)   Advanced care planning/counseling discussion   Terminal care  Sepsis secondary to right groin wound infection. On  cefepime.  Vascular surgery on board.  Blood cultures negative so far.  Wound culture with klebsiella and enterobacter.  Status post debridement by vascular surgery 03/17/19.  Will follow vascular surgery recommendations.  T-max of 98.2 F at this time. 03/20/2019: Patient is now comfort measures only.  Status post  right AKA with debridement of the right groin with a sartorius muscle flap in the past. Further debridement by vascular surgery 03/17/2019.  Follow vascular surgery recommendations. 11/29/2019: Patient is now comfort measures only.  History of coronary artery disease.  No acute issues.     History of GERD- continue PPI  Hypothyroidism -continue Synthroid  Essential hypertension: 03/20/2019: Patient is now comfort measures only.  Pancytopenia, neutropenia likely from  from sepsis at this time    History of myelofibrosis.. Hematooncology was consulted.  G-CSF is not recommended at this time.  Oncology recommended Continue broad-spectrum antibiotic at this time.  Closely monitor WBC. Of note,  patient follows up with Dr Federico Flake, Hematologist  at [facility].   Will transfuse for hemoglobin less than 7 and platelet transfusion for less than 20,000.  Palliative  care has been consulted for goals of care discussion. 03/20/2019: Patient is now comfort measures only.   Concern for difficulty swallowing as per nursing staff.  Appreciated speech and swallow- on liquid diet at this time. 03/20/2019: Patient is now comfort measures only.  CKD stage IIIa: We will continue to monitor BMP.  Check BMP in a.m. creatinine of 0.8. 03/20/2019: Comfort measures only  Code Status: DO NOT RESUSCITATE.    Family Communication:  None today  Disposition Plan: Comfort measures.  Hospice.   Consultants:  Vascular surgery   hematology  Procedures:  Surgical debridement 03/17/19  Antibiotics:  Anti-infectives (From admission, onward)   Start     Dose/Rate Route Frequency Ordered Stop   03/17/19 2200  vancomycin (VANCOREADY) IVPB 1750 mg/350 mL  Status:  Discontinued     1,750 mg 175 mL/hr over 120 Minutes Intravenous Every 48 hours 03/16/19 0034 03/17/19 1444   03/16/19 1000  ceFEPIme (MAXIPIME) 2 g in sodium chloride 0.9 % 100 mL IVPB  Status:  Discontinued     2 g 200 mL/hr over 30 Minutes Intravenous 2 times daily 03/16/19 0034 03/20/19 0938     Subjective: No history from patient.    Objective: Vitals:   03/21/19 0654 03/21/19 0841  BP:  (!) 172/82  Pulse: (!) 137 (!) 134  Resp: (!) 28 (!) 27  Temp:  98.7 F (37.1 C)  SpO2: 99% 99%    Intake/Output Summary (Last 24 hours) at 03/21/2019 1531 Last data filed at 03/21/2019 1000 Gross per 24 hour  Intake 0 ml  Output -  Net 0 ml   Filed Weights   03/16/19 0300 03/17/19 0940  Weight: 77.1 kg 77.1 kg   Body mass index is 30.12 kg/m.   Physical Exam: GENERAL: Patient is not in any distress.  Patient is awake.  Patient is not responding to any questions of asked.   HENT: Pallor.  No jaundice.   NECK: Prominent neck veins CHEST: Clear to auscultation. No crackles or wheezes. Non tender on palpation. Diminished breath sounds bilaterally.  Right chest wall Port-A-Cath in place. CVS: S1 and S2 heard ABDOMEN: Obese, soft, non-tender, bowel sounds are present. EXTREMITIES: Right above-knee  amputation.  No edema left lower extremity.    Data Review: I have personally reviewed the following laboratory data and studies,  CBC: Recent Labs  Lab 03/15/19 0544 03/15/19 2300 03/16/19 0508 03/17/19 0818 03/19/19 0306  WBC 1.8* 1.5* 1.7* 1.4* 1.3*  NEUTROABS 1.3* 0.8*  --   --   --   HGB 8.8* 8.9* 9.2* 9.6* 8.4*  HCT 26.5* 27.6* 27.9* 29.7* 26.0*  MCV 92.0 93.6 91.2 94.0 94.5  PLT 74* 72* 75* 65* 37*   Basic Metabolic Panel: Recent Labs  Lab 03/15/19 0544 03/16/19 0508 03/17/19 0818 03/19/19 0306  NA 144 144 147* 156*  K 4.0 4.0 3.5 3.1*  CL 110 112* 118* 126*  CO2 25 21* 19* 21*  GLUCOSE 170* 148* 117* 123*  BUN 53* 41* 26* 31*  CREATININE 1.06* 0.98 0.81 0.79  CALCIUM 8.9 9.3 9.2 9.2  MG  --   --  2.1 2.0  PHOS  --   --   --  2.3*   Liver Function Tests: Recent Labs  Lab 03/15/19 0544 03/16/19 0508 03/19/19 0306  AST 23 17 20   ALT 15 12 20   ALKPHOS 60 62 44  BILITOT 0.4 0.5 0.4  PROT 4.5* 4.9* 4.6*  ALBUMIN 1.6* 1.8* 1.8*   No results for  input(s): LIPASE, AMYLASE in the last 168 hours. No results for input(s): AMMONIA in the last 168 hours. Cardiac Enzymes: No results for input(s): CKTOTAL, CKMB, CKMBINDEX, TROPONINI in the last 168 hours. BNP (last 3 results) No results for input(s): BNP in the last 8760 hours.  ProBNP (last 3 results) No results for input(s): PROBNP in the last 8760 hours.  CBG: Recent Labs  Lab 03/18/19 2108 03/19/19 0904 03/19/19 1223 03/19/19 1740 03/21/19 0810  GLUCAP 154* 110* 98 98 142*   Recent Results (from the past 240 hour(s))  Culture, Urine     Status: None   Collection Time: 03/14/19  2:53 PM   Specimen: Urine, Catheterized  Result Value Ref Range Status   Specimen Description URINE, CATHETERIZED  Final   Special Requests Normal  Final   Culture   Final    NO GROWTH Performed at [facility], [address] [place], [place] [address]    Report Status 03/16/2019 FINAL  Final  Aerobic  Culture (superficial specimen)     Status: None   Collection Time: 03/15/19  4:05 PM   Specimen: Groin  Result Value Ref Range Status   Specimen Description GROIN RIGHT  Final   Special Requests NONE  Final   Gram Stain   Final    NO WBC SEEN MODERATE GRAM VARIABLE ROD RARE GRAM POSITIVE COCCI IN PAIRS    Culture   Final    ABUNDANT KLEBSIELLA OXYTOCA ABUNDANT ENTEROBACTER AEROGENES MODERATE EIKENELLA CORRODENS Usually susceptible to penicillin and other beta lactam agents,quinolones,macrolides and tetracyclines. Performed at [facility], [place] [address] [place], [place] [address]    Report Status 03/18/2019 FINAL  Final   Organism ID, Bacteria KLEBSIELLA OXYTOCA  Final   Organism ID, Bacteria ENTEROBACTER AEROGENES  Final      Susceptibility   Enterobacter aerogenes - MIC*    CEFAZOLIN RESISTANT Resistant     CEFEPIME <=0.12 SENSITIVE Sensitive     CEFTAZIDIME <=1 SENSITIVE Sensitive     CEFTRIAXONE <=0.25 SENSITIVE Sensitive     CIPROFLOXACIN <=0.25 SENSITIVE Sensitive     GENTAMICIN <=1 SENSITIVE Sensitive     IMIPENEM <=0.25 SENSITIVE Sensitive     TRIMETH/SULFA <=20 SENSITIVE Sensitive     PIP/TAZO <=4 SENSITIVE Sensitive     * ABUNDANT ENTEROBACTER AEROGENES   Klebsiella oxytoca - MIC*    AMPICILLIN >=32 RESISTANT Resistant     CEFAZOLIN 16 SENSITIVE Sensitive     CEFEPIME <=0.12 SENSITIVE Sensitive     CEFTAZIDIME <=1 SENSITIVE Sensitive     CEFTRIAXONE <=0.25 SENSITIVE Sensitive     CIPROFLOXACIN <=0.25 SENSITIVE Sensitive     GENTAMICIN <=1 SENSITIVE Sensitive     IMIPENEM <=0.25 SENSITIVE Sensitive     TRIMETH/SULFA <=20 SENSITIVE Sensitive     AMPICILLIN/SULBACTAM 16 INTERMEDIATE Intermediate     PIP/TAZO <=4 SENSITIVE Sensitive     * ABUNDANT KLEBSIELLA OXYTOCA  Blood culture , two sites.     Status: None   Collection Time: 03/15/19  6:42 PM   Specimen: BLOOD  Result Value Ref Range Status   Specimen Description BLOOD LEFT ANTECUBITAL  Final    Special Requests   Final    BOTTLES DRAWN AEROBIC AND ANAEROBIC Blood Culture adequate volume   Culture   Final    NO GROWTH 5 DAYS Performed at [facility], [address], [place], [place] [address]    Report Status 03/20/2019 FINAL  Final  Culture, blood (routine x 2)  Status: None   Collection Time: 03/15/19  6:55 PM   Specimen: BLOOD  Result Value Ref Range Status   Specimen Description BLOOD LEFT ANTECUBITAL  Final   Special Requests   Final    BOTTLES DRAWN AEROBIC ONLY Blood Culture adequate volume   Culture   Final    NO GROWTH 5 DAYS Performed at [facility], [address], [place], [place] [address]    Report Status 03/20/2019 FINAL  Final  SARS CORONAVIRUS 2 (TAT 6-24 HRS) Nasopharyngeal Nasopharyngeal Swab     Status: None   Collection Time: 03/20/19 11:10 AM   Specimen: Nasopharyngeal Swab  Result Value Ref Range Status   SARS Coronavirus 2 NEGATIVE NEGATIVE Final    Comment: (NOTE) SARS-CoV-2 target nucleic acids are NOT DETECTED. The SARS-CoV-2 RNA is generally detectable in upper and lower respiratory specimens during the acute phase of infection. Negative results do not preclude SARS-CoV-2 infection, do not rule out co-infections with other pathogens, and should not be used as the sole basis for treatment or other patient management decisions. Negative results must be combined with clinical observations, patient history, and epidemiological information. The expected result is Negative. Fact Sheet for Patients: SugarRoll.be Fact Sheet for Healthcare Providers: https://www.woods-mathews.com/ This test is not yet approved or cleared by the [place] FDA and  has been authorized for detection and/or diagnosis of SARS-CoV-2 by FDA under an Emergency Use Authorization (EUA). This EUA will remain  in effect (meaning this test can be used) for the duration of the COVID-19 declaration under Section 56  4(b)(1) of the Act, 21 U.S.C. section 360bbb-3(b)(1), unless the authorization is terminated or revoked sooner. Performed at [facility], [place] [address] [place], [place] [address]      Studies: No results found.  Scheduled Meds: . collagenase  1 application Topical Daily  . feeding supplement (ENSURE ENLIVE)  237 mL Oral QID  . glycopyrrolate  0.4 mg Intravenous Q4H    Continuous Infusions:    Bonnell Public, MD  [facility] 03/21/2019

## 2019-03-21 NOTE — Progress Notes (Signed)
Spoke with [name], patient's son to update him on his mom. He is on his way to the hospital.

## 2019-03-21 NOTE — Progress Notes (Signed)
Palliative Medicine Inpatient Follow Up Note  HPI: Per intake H&P --> Debra Hopkins a 79 y.o.femalewith medical history significant ofhistory of CAD, diabetes mellitus, TIA, left CEA 2008, fibromyalgia, macular degeneration, peripheral vascular disease with multiple revascularization procedures and maintained on low-dose aspirin. History taken from chart review and patient. Presented on 02/15/2019 with chronic RLE ischemic changes and toe ulceration. Recent aortogram showed aortic and iliac segments heavily calcified. 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 [name] on 02/15/2019. Noted postoperatively mottling of the right foot started on heparin drip. 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 Donnetta Hutching. Hospital course further complicated by acute blood loss anemia patient was transfused 2 units with latest hemoglobin 9.5. Subcutaneous heparin was initiated for DVT prophylaxis 02/23/2019 as well as remaining on low-dose aspirin. Vancomycin resumed 03/01/2023 wound coverage.Patient admitted to acute rehab where she has now developed fever chills and generally septic. She is being followed by vascular surgery with a wound VAC and dressing. Antibiotics was broadened and she is transferred back to acute care for treatment of sepsis probably infected right groin wound.  Palliative Events: 1/8 - was transitioned to comfort oriented care.   1/9 - Appears comfortable upon AM Assessment. Bed found at [facility]  1/10- Plan for transition to [facility] tomorrow if a bed is available  Today's Discussion (03/21/2019): Chart reviewed. Patient evaluated, appears far less agitated today.  Per discussion with bedside RN, Aldona Bar patient has remained comfortable.  Still awaiting a bed at [facility], son is aware. Clinically appears to be progressing to more active phases of dying process.   Subjective Assessment: Patient not opening eyes during assessment. Not having any nonverbal indicators of pain or dyspnea.  Vital Signs Vitals:   03/21/19 0654 03/21/19 0841  BP:  (!) 172/82  Pulse: (!) 137 (!) 134  Resp: (!) 28 (!) 27  Temp:  98.7 F (37.1 C)  SpO2: 99% 99%   Gen:  Elderly F in NAD HEENT: dry mucous membranes CV: Irregular rate and rhythm  PULM: diminished to auscultation bilaterally  ABD: soft/nontender  EXT: No edema  Neuro: Somnolent  Recommendations and Plan: Pain, in the setting of debility:                 - Morphine 1-2mg  IV Q1H PRN                  - Heat Pack PRN Agitation:                 - Lorazepam 0.5mg -1.g IV Q1H Secretions:                 - Glycopyrrolate 0.4mg  IV Q4H ATC Dry Eyes:                 - Artificial Tears PRN Constipation:                 - Bisacodyl 10mg  PR Wound Care:                 - Turn Q2H                 - Santyl as ordered                 -  Daily dressing changes                 - Lift heels  Time In: 1200 Time Out: 1215 Time Spent:  25 Greater than 50% of the time was spent in counseling and coordination of care ______________________________________________________________________________________ [place] Team Team Cell Phone: [phone] Please utilize secure chat with additional questions, if there is no response within 30 minutes please call the above phone number  Palliative Medicine Team providers are available by phone from 7am to 7pm daily and can be reached through the team cell phone.  Should this patient require assistance outside of these hours, please call the patient's attending physician.

## 2019-03-21 NOTE — Care Management (Addendum)
07:20 Faxed negative COVID tests to [facility] [phone]. When they open, I will call to assess today's bed availability.  08:40 Awaiting callback from [facility] about bed availability. 09:22 Call back from [facility] [phone]. They do not currently have a bed available. They will have one open soon, possibly late tonight/ early tomorrow. I notified [name], the patient's son.

## 2019-03-22 MED ORDER — POLYVINYL ALCOHOL 1.4 % OP SOLN: 1.0000 [drp] | Freq: Four times a day (QID) | OPHTHALMIC | 0 refills | Status: AC | PRN

## 2019-03-22 MED ORDER — SCOPOLAMINE 1 MG/3DAYS TD PT72: 1.0000 | MEDICATED_PATCH | TRANSDERMAL | 1 refills | Status: AC

## 2019-03-22 MED ORDER — MORPHINE SULFATE (CONCENTRATE) 10 MG /0.5 ML PO SOLN: 10.0000 mg | ORAL | 0 refills | Status: AC | PRN

## 2019-03-22 MED ORDER — LORAZEPAM 2 MG/ML PO CONC: 0.5000 mg | ORAL | 0 refills | Status: AC | PRN

## 2019-03-22 NOTE — Progress Notes (Signed)
Brief hematology note:  Chart has been reviewed.  Note that the patient has been resistant to comfort measures only.  Planning for discharge to residential hospice hopefully later today.  No recent labs.  Oncology will sign off at this time.  Please call if any questions arise.  Mikey Bussing, DNP, AGPCNP-BC, AOCNP

## 2019-03-22 NOTE — Discharge Summary (Signed)
Physician Discharge Summary  Debra Hopkins [id number] DOB: [birth year] DOA: 03/15/2019  PCP: Monico Blitz, MD  Admit date: 03/15/2019 Discharge date: 03/22/2019  Time spent: 25 minutes  Recommendations for Outpatient Follow-up:  1. Patient to be discharged to [facility] with very guarded prognosis  Discharge Diagnoses:  Principal Problem:   Sepsis ([place]) Active Problems:   Mixed hyperlipidemia   Essential hypertension, benign   Coronary atherosclerosis   PVD   Hypothyroidism   GERD (gastroesophageal reflux disease)   Type 2 diabetes mellitus with stage 3 chronic kidney disease, without long-term current use of insulin (HCC)   S/P AKA (above knee amputation) unilateral, right ([place])   Myelofibrosis ([place])   Palliative care by specialist   Goals of care, counseling/discussion   DNR (do not resuscitate)   Advanced care planning/counseling discussion   Terminal care   Discharge Condition: Guarded comfort related  Diet recommendation: Comfort  Filed Weights   03/16/19 0300 03/17/19 0940  Weight: 77.1 kg 77.1 kg    History of present illness:  79 year old white female DM TY 2, TIA status post left CEA 11/2006, macular degeneration, CAD, PVD s/p L Femoral PTA 2003 for claudication   Admit 12/7 through 12/21 chronic right lower extremity ischemic changes toe ulceration status post right SFA 12/7 but unfortunately postop mottling noted-underwent R CFA and ischemic changes resulting in 02/22/2019 right AKA   Developed fever and chills at rehab antibiotics were broadened-felt this was secondary to right groin wound patient was transitioned to IV vancomycin and cefepime     Data Reviewed:  Sodium up from 1 47-1 56, potassium 3.5-->3.1 Bicarb 21 BUN/creatinine 26/0.8-->31/0.79 Albumin 1.8 WBC 1.3 prior 1.7 Hemoglobin 8.4 Platelet 37 Assessment & Plan: Sepsis secondary to right groin wound infection-recent right AKA status post multiple surgeries December and debridement  03/17/2019 HTN CAD Hypothyroid CKD 3 8  Patient was seen by palliative care and hospitalist service and it appears that she is actively dying she has been encephalopathic Goals of care were sought and it was felt that patient would not do well As such patient was transition to hospice trajectory and was eventually discharged to hospice facilityWhen bed was available     Awake alert very slow to respond has received morphine this morning somewhat incoherent Not verbalizing well Seems to have pain when I press on her belly S1-S2 tachycardic no rales no rhonchi Lower extremity on right side has AKA she also has a POA can      Discharge Exam: Vitals:   03/21/19 0841 03/22/19 0631  BP: (!) 172/82 (!) 169/74  Pulse: (!) 134 (!) 103  Resp: (!) 27 10  Temp: 98.7 F (37.1 C) 98.5 F (36.9 C)  SpO2: 99% 98%    General: Awake and arouses minimally to agitation Cardiovascular: S1-S2 tachycardic murmur Respiratory: Clear no added sounds Skin not examined Neuro exam difficult given sleepiness  Discharge Instructions   Discharge Instructions    Diet - low sodium heart healthy   Complete by: As directed    Increase activity slowly   Complete by: As directed      Allergies as of 03/22/2019      Reactions   Byetta 10 Mcg Pen [exenatide] Other (See Comments)   Unknown   Ceclor [cefaclor] Other (See Comments)   Unknown   Celexa [citalopram Hydrobromide] Other (See Comments)   Sick   Ciprofloxacin Nausea Only, Other (See Comments)   Dizziness   Claritin [loratadine] Other (See Comments)   Hair fell out  Erythromycin Other (See Comments)   REACTION: Upset stomach   Flexeril [cyclobenzaprine] Other (See Comments)   Nervous   Fosamax [alendronate] Other (See Comments)   Made pt sick   Jardiance [empagliflozin] Other (See Comments)   Vaginal infection, UTI   Motrin [ibuprofen] Nausea Only   Naproxen Other (See Comments)   REACTION: severe nausea   Norvasc [amlodipine  Besylate] Other (See Comments)   Unknown   Prednisone Other (See Comments)   REACTION: itchy rash   Prozac [fluoxetine Hcl] Other (See Comments)   Sick   Sulfamethoxazole-trimethoprim Nausea And Vomiting, Other (See Comments)   REACTION: Upset stomach      Medication List    STOP taking these medications   Accu-Chek Aviva Plus test strip Generic drug: glucose blood   Accu-Chek Softclix Lancets lancets   Alaway 0.025 % ophthalmic solution Generic drug: ketotifen   allopurinol 100 MG tablet Commonly known as: ZYLOPRIM   aspirin EC 81 MG tablet   Calcium Citrate 250 MG Tabs   clobetasol 0.05 % external solution Commonly known as: TEMOVATE   cloNIDine 0.1 MG tablet Commonly known as: CATAPRES   Demadex 20 MG tablet Generic drug: torsemide   esomeprazole 20 MG capsule Commonly known as: NEXIUM   fluticasone 0.05 % cream Commonly known as: CUTIVATE   Global Ease Inject Pen Needles 32G X 4 MM Misc Generic drug: Insulin Pen Needle   hydrALAZINE 25 MG tablet Commonly known as: APRESOLINE   insulin glargine 100 UNIT/ML injection Commonly known as: LANTUS   Krill Oil Omega-3 500 MG Caps   levothyroxine 75 MCG tablet Commonly known as: SYNTHROID   losartan 100 MG tablet Commonly known as: COZAAR   methocarbamol 750 MG tablet Commonly known as: ROBAXIN   NIFEdipine 30 MG 24 hr tablet Commonly known as: PROCARDIA-XL/NIFEDICAL-XL   OSTEO BI-FLEX ONE PER DAY PO   oxyCODONE-acetaminophen 5-325 MG tablet Commonly known as: PERCOCET/ROXICET   pregabalin 25 MG capsule Commonly known as: LYRICA   PRESERVISION AREDS 2 PO   Refresh Optive Advanced 0.5-1-0.5 % Soln Generic drug: Carboxymeth-Glycerin-Polysorb Replaced by: polyvinyl alcohol 1.4 % ophthalmic solution   simvastatin 10 MG tablet Commonly known as: ZOCOR   SUPER B COMPLEX/C PO   SYSTANE BALANCE OP   TART CHERRY ADVANCED PO   traZODone 100 MG tablet Commonly known as: DESYREL   Vitamin D3  50 MCG (2000 UT) Tabs     TAKE these medications   LORazepam 2 MG/ML concentrated solution Commonly known as: ATIVAN Take 0.3 mLs (0.6 mg total) by mouth every 4 (four) hours as needed for anxiety.   morphine CONCENTRATE 10 mg / 0.5 ml concentrated solution Take 0.5 mLs (10 mg total) by mouth every 3 (three) hours as needed for severe pain.   polyvinyl alcohol 1.4 % ophthalmic solution Commonly known as: LIQUIFILM TEARS Place 1 drop into both eyes 4 (four) times daily as needed (for dry eyes). Replaces: Refresh Optive Advanced 0.5-1-0.5 % Soln   scopolamine 1 MG/3DAYS Commonly known as: Transderm-Scop (1.5 MG) Place 1 patch (1.5 mg total) onto the skin every 3 (three) days.      Allergies  Allergen Reactions  . Byetta 10 Mcg Pen [Exenatide] Other (See Comments)    Unknown  . Ceclor [Cefaclor] Other (See Comments)    Unknown  . Celexa [Citalopram Hydrobromide] Other (See Comments)    Sick  . Ciprofloxacin Nausea Only and Other (See Comments)    Dizziness  . Claritin [Loratadine] Other (See Comments)  Hair fell out  . Erythromycin Other (See Comments)    REACTION: Upset stomach  . Flexeril [Cyclobenzaprine] Other (See Comments)    Nervous  . Fosamax [Alendronate] Other (See Comments)    Made pt sick  . Jardiance [Empagliflozin] Other (See Comments)    Vaginal infection, UTI  . Motrin [Ibuprofen] Nausea Only  . Naproxen Other (See Comments)    REACTION: severe nausea  . Norvasc [Amlodipine Besylate] Other (See Comments)    Unknown  . Prednisone Other (See Comments)    REACTION: itchy rash  . Prozac [Fluoxetine Hcl] Other (See Comments)    Sick  . Sulfamethoxazole-Trimethoprim Nausea And Vomiting and Other (See Comments)    REACTION: Upset stomach      The results of significant diagnostics from this hospitalization (including imaging, microbiology, ancillary and laboratory) are listed below for reference.    Significant Diagnostic Studies: CT ABDOMEN PELVIS  W CONTRAST  Result Date: 03/15/2019 CLINICAL DATA:  79 year old female status post right above knee amputation and muscle flap to the right groin incision. Concern for cellulitis/soft tissue infection. EXAM: CT ABDOMEN AND PELVIS WITH CONTRAST TECHNIQUE: Multidetector CT imaging of the abdomen and pelvis was performed using the standard protocol following bolus administration of intravenous contrast. CONTRAST:  161mL OMNIPAQUE IOHEXOL 300 MG/ML  SOLN COMPARISON:  CT of the abdomen pelvis dated 09/05/2018. FINDINGS: Lower chest: Partially visualized small bilateral pleural effusions with associated partial compressive atelectasis of the lung bases. Borderline cardiomegaly. Coronary vascular calcifications noted. No intra-abdominal free air or free fluid. Hepatobiliary: The liver is unremarkable. No intrahepatic biliary ductal dilatation. Minimal layering gallstones. No pericholecystic fluid or evidence of acute cholecystitis by CT. Pancreas: Unremarkable. No pancreatic ductal dilatation or surrounding inflammatory changes. Spleen: Top-normal spleen measuring up to 13 cm in length. Adrenals/Urinary Tract: The adrenal glands are unremarkable. There is no hydronephrosis on either side. Subcentimeter left renal inferior pole hypodense focus is too small to characterize. There is symmetric enhancement and excretion of contrast by both kidneys. The visualized ureters and urinary bladder appear unremarkable. Stomach/Bowel: There is moderate stool throughout the colon. There is no bowel obstruction or active inflammation. There is sigmoid diverticulosis without active inflammatory changes. Appendectomy. Vascular/Lymphatic: Advanced aortoiliac atherosclerotic disease. The IVC is unremarkable. No portal venous gas. There is no adenopathy. Severe atherosclerotic calcification of the superficial femoral arteries bilaterally most severely involving the right lower extremity. There is associated high-grade luminal narrowing and  non opacification of the SFA's bilaterally. Reproductive: Hysterectomy. No adnexal masses. Other: There is postsurgical changes of right above knee amputation. There is open wound in the right groin and small pockets of air in the superficial soft tissues of the right groin. There is diffuse edema of the subcutaneous soft tissues of the right hip and right lower extremity which may represent cellulitis. Clinical correlation is recommended. No drainable fluid collection or abscess. Musculoskeletal: Above knee amputation of the right lower extremity. The edge of the right femoral stump appears sharp and smooth. There is some callus formation adjacent to the distal end of the right femoral stump. No periosteal elevation or bone erosion. There is multilevel degenerative changes of the spine. No acute fracture. IMPRESSION: 1. Postsurgical changes of right above knee amputation. There is an open wound in the right groin and small pockets of air in the superficial soft tissues of the right groin, likely postsurgical. No drainable fluid collection or abscess. 2. Diffuse edema of the subcutaneous soft tissues of the right hip and right lower extremity may  represent cellulitis. Clinical correlation is recommended. 3. No bowel obstruction or active inflammation. Normal appendix. 4. Sigmoid diverticulosis. 5. Small bilateral pleural effusions with partial compressive atelectasis of the lung bases. 6. Aortic Atherosclerosis (ICD10-I70.0). Electronically Signed   By: Anner Crete M.D.   On: 03/15/2019 22:56   CT FEMUR RIGHT W CONTRAST  Result Date: 03/15/2019 CLINICAL DATA:  79 year old female status post right above knee amputation and muscle flap to the right groin incision. Concern for cellulitis/soft tissue infection. EXAM: CT ABDOMEN AND PELVIS WITH CONTRAST TECHNIQUE: Multidetector CT imaging of the abdomen and pelvis was performed using the standard protocol following bolus administration of intravenous contrast.  CONTRAST:  132mL OMNIPAQUE IOHEXOL 300 MG/ML  SOLN COMPARISON:  CT of the abdomen pelvis dated 09/05/2018. FINDINGS: Lower chest: Partially visualized small bilateral pleural effusions with associated partial compressive atelectasis of the lung bases. Borderline cardiomegaly. Coronary vascular calcifications noted. No intra-abdominal free air or free fluid. Hepatobiliary: The liver is unremarkable. No intrahepatic biliary ductal dilatation. Minimal layering gallstones. No pericholecystic fluid or evidence of acute cholecystitis by CT. Pancreas: Unremarkable. No pancreatic ductal dilatation or surrounding inflammatory changes. Spleen: Top-normal spleen measuring up to 13 cm in length. Adrenals/Urinary Tract: The adrenal glands are unremarkable. There is no hydronephrosis on either side. Subcentimeter left renal inferior pole hypodense focus is too small to characterize. There is symmetric enhancement and excretion of contrast by both kidneys. The visualized ureters and urinary bladder appear unremarkable. Stomach/Bowel: There is moderate stool throughout the colon. There is no bowel obstruction or active inflammation. There is sigmoid diverticulosis without active inflammatory changes. Appendectomy. Vascular/Lymphatic: Advanced aortoiliac atherosclerotic disease. The IVC is unremarkable. No portal venous gas. There is no adenopathy. Severe atherosclerotic calcification of the superficial femoral arteries bilaterally most severely involving the right lower extremity. There is associated high-grade luminal narrowing and non opacification of the SFA's bilaterally. Reproductive: Hysterectomy. No adnexal masses. Other: There is postsurgical changes of right above knee amputation. There is open wound in the right groin and small pockets of air in the superficial soft tissues of the right groin. There is diffuse edema of the subcutaneous soft tissues of the right hip and right lower extremity which may represent cellulitis.  Clinical correlation is recommended. No drainable fluid collection or abscess. Musculoskeletal: Above knee amputation of the right lower extremity. The edge of the right femoral stump appears sharp and smooth. There is some callus formation adjacent to the distal end of the right femoral stump. No periosteal elevation or bone erosion. There is multilevel degenerative changes of the spine. No acute fracture. IMPRESSION: 1. Postsurgical changes of right above knee amputation. There is an open wound in the right groin and small pockets of air in the superficial soft tissues of the right groin, likely postsurgical. No drainable fluid collection or abscess. 2. Diffuse edema of the subcutaneous soft tissues of the right hip and right lower extremity may represent cellulitis. Clinical correlation is recommended. 3. No bowel obstruction or active inflammation. Normal appendix. 4. Sigmoid diverticulosis. 5. Small bilateral pleural effusions with partial compressive atelectasis of the lung bases. 6. Aortic Atherosclerosis (ICD10-I70.0). Electronically Signed   By: Anner Crete M.D.   On: 03/15/2019 22:56   VAS [place] LOWER EXTREMITY VENOUS (DVT)  Result Date: 03/03/2019  Lower Venous Study Indications: Edema.  Limitations: Pain pain tolerance. Comparison Study: no prior Performing Technologist: Abram Sander RVS  Examination Guidelines: A complete evaluation includes B-mode imaging, spectral Doppler, color Doppler, and power Doppler as needed of all  accessible portions of each vessel. Bilateral testing is considered an integral part of a complete examination. Limited examinations for reoccurring indications may be performed as noted.  +---------+---------------+---------+-----------+----------+-------------------+ LEFT     CompressibilityPhasicitySpontaneityPropertiesThrombus Aging      +---------+---------------+---------+-----------+----------+-------------------+ CFV      Full           Yes      Yes                                       +---------+---------------+---------+-----------+----------+-------------------+ SFJ      Full                                                             +---------+---------------+---------+-----------+----------+-------------------+ FV Prox  Full                                                             +---------+---------------+---------+-----------+----------+-------------------+ FV Mid   Full                                                             +---------+---------------+---------+-----------+----------+-------------------+ FV DistalFull                                                             +---------+---------------+---------+-----------+----------+-------------------+ PFV      Full                                                             +---------+---------------+---------+-----------+----------+-------------------+ POP                     Yes      Yes                  unable to tolerate                                                        compression         +---------+---------------+---------+-----------+----------+-------------------+ PTV                                                   unable to  tolerate                                                        compression         +---------+---------------+---------+-----------+----------+-------------------+ PERO                                                  Not visualized      +---------+---------------+---------+-----------+----------+-------------------+     Summary: Left: There is no evidence of deep vein thrombosis in the lower extremity. However, portions of this examination were limited- see technologist comments above. No cystic structure found in the popliteal fossa.  *See table(s) above for measurements and observations. Electronically signed by Curt Jews MD on 03/03/2019 at 4:06:25 PM.    Final      Microbiology: Recent Results (from the past 240 hour(s))  Culture, Urine     Status: None   Collection Time: 03/14/19  2:53 PM   Specimen: Urine, Catheterized  Result Value Ref Range Status   Specimen Description URINE, CATHETERIZED  Final   Special Requests Normal  Final   Culture   Final    NO GROWTH Performed at [facility], [address] [place], [place] [address]    Report Status 03/16/2019 FINAL  Final  Aerobic Culture (superficial specimen)     Status: None   Collection Time: 03/15/19  4:05 PM   Specimen: Groin  Result Value Ref Range Status   Specimen Description GROIN RIGHT  Final   Special Requests NONE  Final   Gram Stain   Final    NO WBC SEEN MODERATE GRAM VARIABLE ROD RARE GRAM POSITIVE COCCI IN PAIRS    Culture   Final    ABUNDANT KLEBSIELLA OXYTOCA ABUNDANT ENTEROBACTER AEROGENES MODERATE EIKENELLA CORRODENS Usually susceptible to penicillin and other beta lactam agents,quinolones,macrolides and tetracyclines. Performed at [facility], [place] [address] [place],  [address]    Report Status 03/18/2019 FINAL  Final   Organism ID, Bacteria KLEBSIELLA OXYTOCA  Final   Organism ID, Bacteria ENTEROBACTER AEROGENES  Final      Susceptibility   Enterobacter aerogenes - MIC*    CEFAZOLIN RESISTANT Resistant     CEFEPIME <=0.12 SENSITIVE Sensitive     CEFTAZIDIME <=1 SENSITIVE Sensitive     CEFTRIAXONE <=0.25 SENSITIVE Sensitive     CIPROFLOXACIN <=0.25 SENSITIVE Sensitive     GENTAMICIN <=1 SENSITIVE Sensitive     IMIPENEM <=0.25 SENSITIVE Sensitive     TRIMETH/SULFA <=20 SENSITIVE Sensitive     PIP/TAZO <=4 SENSITIVE Sensitive     * ABUNDANT ENTEROBACTER AEROGENES   Klebsiella oxytoca - MIC*    AMPICILLIN >=32 RESISTANT Resistant     CEFAZOLIN 16 SENSITIVE Sensitive     CEFEPIME <=0.12 SENSITIVE Sensitive     CEFTAZIDIME <=1 SENSITIVE Sensitive     CEFTRIAXONE <=0.25 SENSITIVE Sensitive     CIPROFLOXACIN <=0.25 SENSITIVE  Sensitive     GENTAMICIN <=1 SENSITIVE Sensitive     IMIPENEM <=0.25 SENSITIVE Sensitive     TRIMETH/SULFA <=20 SENSITIVE Sensitive     AMPICILLIN/SULBACTAM 16 INTERMEDIATE Intermediate     PIP/TAZO <=4 SENSITIVE Sensitive     * ABUNDANT  KLEBSIELLA OXYTOCA  Blood culture , two sites.     Status: None   Collection Time: 03/15/19  6:42 PM   Specimen: BLOOD  Result Value Ref Range Status   Specimen Description BLOOD LEFT ANTECUBITAL  Final   Special Requests   Final    BOTTLES DRAWN AEROBIC AND ANAEROBIC Blood Culture adequate volume   Culture   Final    NO GROWTH 5 DAYS Performed at [facility], [address] [place], [place] [address]    Report Status 03/20/2019 FINAL  Final  Culture, blood (routine x 2)     Status: None   Collection Time: 03/15/19  6:55 PM   Specimen: BLOOD  Result Value Ref Range Status   Specimen Description BLOOD LEFT ANTECUBITAL  Final   Special Requests   Final    BOTTLES DRAWN AEROBIC ONLY Blood Culture adequate volume   Culture   Final    NO GROWTH 5 DAYS Performed at [facility], [place] [address], [place], [place] [address]    Report Status 03/20/2019 FINAL  Final  SARS CORONAVIRUS 2 (TAT 6-24 HRS) Nasopharyngeal Nasopharyngeal Swab     Status: None   Collection Time: 03/20/19 11:10 AM   Specimen: Nasopharyngeal Swab  Result Value Ref Range Status   SARS Coronavirus 2 NEGATIVE NEGATIVE Final    Comment: (NOTE) SARS-CoV-2 target nucleic acids are NOT DETECTED. The SARS-CoV-2 RNA is generally detectable in upper and lower respiratory specimens during the acute phase of infection. Negative results do not preclude SARS-CoV-2 infection, do not rule out co-infections with other pathogens, and should not be used as the sole basis for treatment or other patient management decisions. Negative results must be combined with clinical observations, patient history, and epidemiological information. The expected result is Negative. Fact Sheet for  Patients: SugarRoll.be Fact Sheet for Healthcare Providers: https://www.woods-mathews.com/ This test is not yet approved or cleared by the [place] FDA and  has been authorized for detection and/or diagnosis of SARS-CoV-2 by FDA under an Emergency Use Authorization (EUA). This EUA will remain  in effect (meaning this test can be used) for the duration of the COVID-19 declaration under Section 56 4(b)(1) of the Act, 21 U.S.C. section 360bbb-3(b)(1), unless the authorization is terminated or revoked sooner. Performed at [facility], [place] [address], [place], [place] [address]      Labs: Basic Metabolic Panel: Recent Labs  Lab 03/16/19 0508 03/17/19 0818 03/19/19 0306  NA 144 147* 156*  K 4.0 3.5 3.1*  CL 112* 118* 126*  CO2 21* 19* 21*  GLUCOSE 148* 117* 123*  BUN 41* 26* 31*  CREATININE 0.98 0.81 0.79  CALCIUM 9.3 9.2 9.2  MG  --  2.1 2.0  PHOS  --   --  2.3*   Liver Function Tests: Recent Labs  Lab 03/16/19 0508 03/19/19 0306  AST 17 20  ALT 12 20  ALKPHOS 62 44  BILITOT 0.5 0.4  PROT 4.9* 4.6*  ALBUMIN 1.8* 1.8*   No results for input(s): LIPASE, AMYLASE in the last 168 hours. No results for input(s): AMMONIA in the last 168 hours. CBC: Recent Labs  Lab 03/15/19 2300 03/16/19 0508 03/17/19 0818 03/19/19 0306  WBC 1.5* 1.7* 1.4* 1.3*  NEUTROABS 0.8*  --   --   --   HGB 8.9* 9.2* 9.6* 8.4*  HCT 27.6* 27.9* 29.7* 26.0*  MCV 93.6 91.2 94.0 94.5  PLT 72* 75* 65* 37*   Cardiac Enzymes: No results  for input(s): CKTOTAL, CKMB, CKMBINDEX, TROPONINI in the last 168 hours. BNP: BNP (last 3 results) No results for input(s): BNP in the last 8760 hours.  ProBNP (last 3 results) No results for input(s): PROBNP in the last 8760 hours.  CBG: Recent Labs  Lab 03/18/19 2108 03/19/19 0904 03/19/19 1223 03/19/19 1740 03/21/19 0810  GLUCAP 154* 110* 98 98 142*       Signed:  Nita Sells MD    [facility] 03/22/2019, 1:58 PM

## 2019-03-22 NOTE — Plan of Care (Signed)
Care plan goals met - pt adequate for discharge to hospice facility.

## 2019-03-22 NOTE — Progress Notes (Signed)
RN gave report to [employer].   Debra Glad RN verified that pt's port can continue to be accessed since it is pt's only route of receiving comfort medications currently.  RN will continue to monitor pt until discharge.

## 2019-03-22 NOTE — Progress Notes (Signed)
Pt picked up by [facility] for transfer. They are aware that hospice facility is allowing port-a-cath to be accessed. Pt left with belongings.

## 2019-03-22 NOTE — Care Management (Addendum)
08:21 LVM with office manager at [place] to check on bed availability. Awaiting call back. 10:25 LVM with Sherral Hammers at [facility] again requesting call back about bed status. [place] son, updated on progress. [name] at [facility], faxed over DC note. She will call son to get consents signed, after which patient will be able to transport.  1600 Received call from [place], all forms complete, called son [name] to let him know that patient will be leaving soon.  Higinio Roger for address.  Requested [facility] pickup. [phone] Please call report to [phone]- sent to nurse in secure chat. Patient will need de- accessed per request of [facility]. Spoke w nurse to notify her of the above.

## 2019-03-26 ENCOUNTER — Encounter: Payer: Medicare Other | Admitting: Vascular Surgery

## 2019-04-01 ENCOUNTER — Ambulatory Visit: Payer: Medicare Other | Admitting: "Endocrinology

## 2019-04-12 DEATH — deceased

## 2019-05-10 ENCOUNTER — Other Ambulatory Visit: Payer: Self-pay | Admitting: Oncology

## 2020-12-09 IMAGING — CT CT HEAD W/O CM
4 series · 16 of 47 positions shown, 18 images · non-contrast
Comparison: Head CT 01/28/2019.

CLINICAL DATA: 78-year-old female with history of altered level of
consciousness.

EXAM:
CT HEAD WITHOUT CONTRAST
TECHNIQUE: Contiguous axial images were obtained from the base of the skull
through the vertex without intravenous contrast.

[Series 3: head without · axial · non-contrast · 0.41mm/px · z∈[-115,+5]mm · 7 of 32 slices shown, 9 images]
[im 4/32  brain]
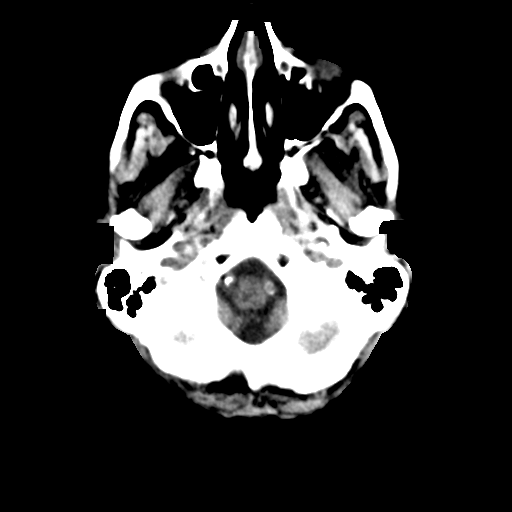
[im 4/32  bone]
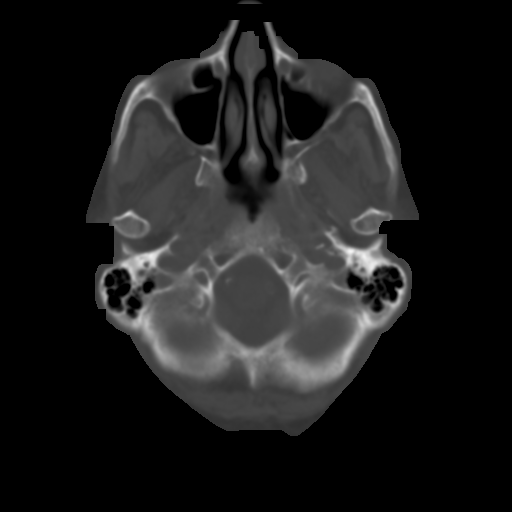
[im 8/32  brain]
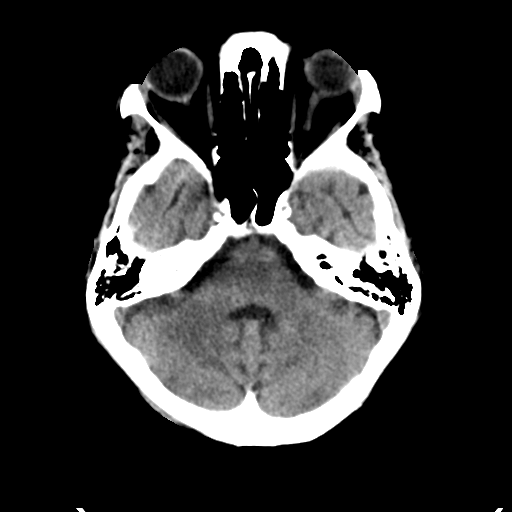
[im 12/32  brain]
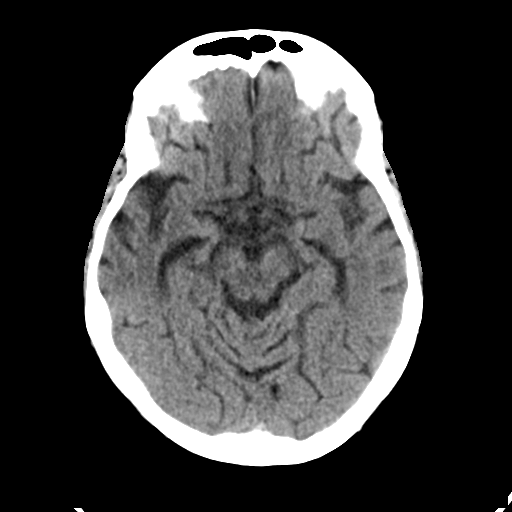
[im 16/32  brain]
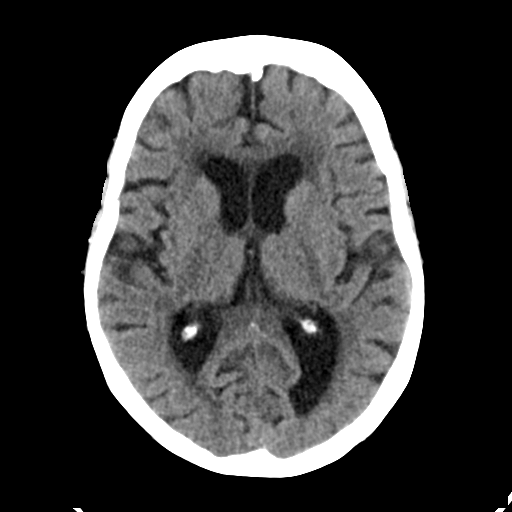
[im 20/32  brain]
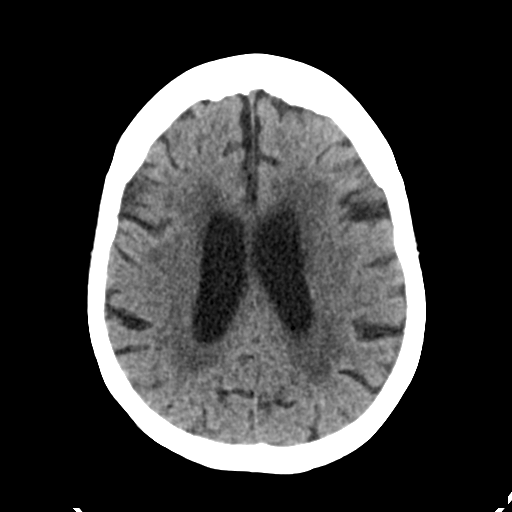
[im 20/32  bone]
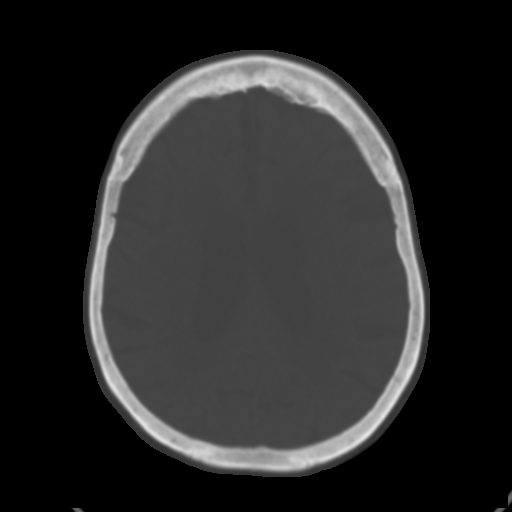
[im 24/32  brain]
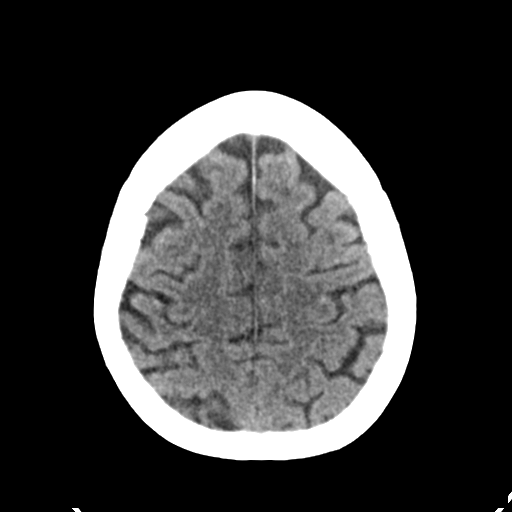
[im 28/32  brain]
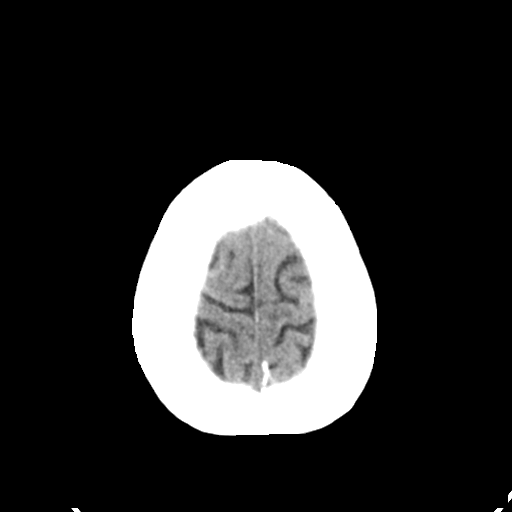

[Series 4: head bone · axial · 0.41mm/px · z∈[-116,-84]mm · 3 of 80 slices shown]
[im 8/80  bone]
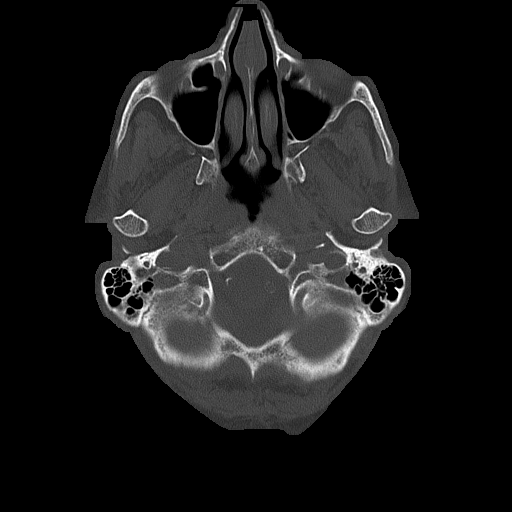
[im 16/80  bone]
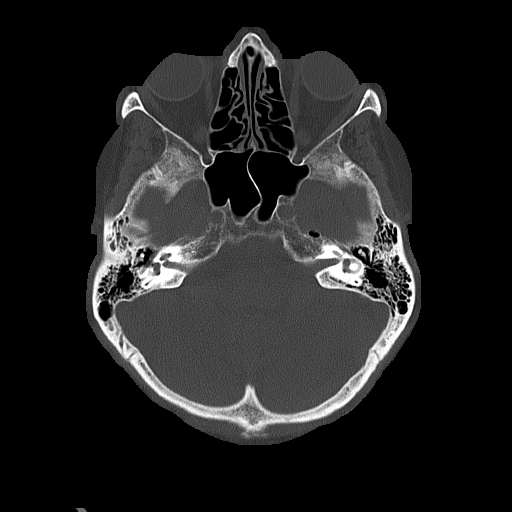
[im 24/80  bone]
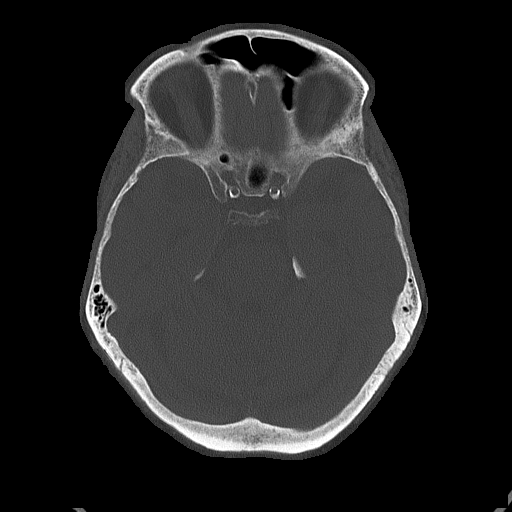

[Series 5: head without cor · coronal · non-contrast · 0.31mm/px · 3 of 67 slices shown]
[im 23/67  brain]
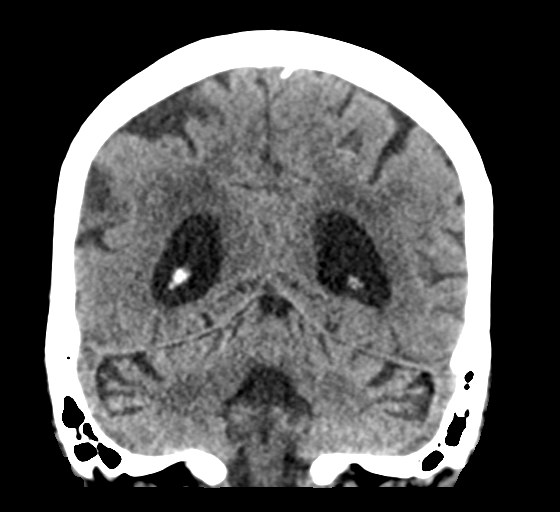
[im 30/67  brain]
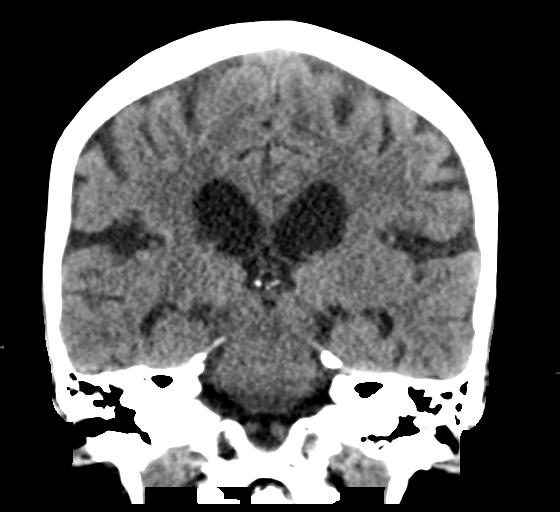
[im 37/67  brain]
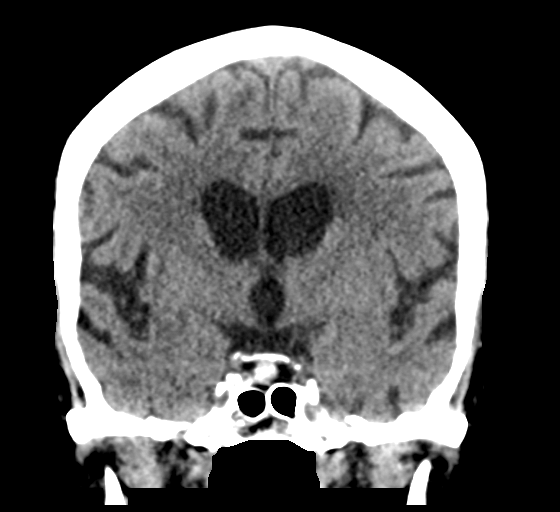

[Series 6: head without sag · sagittal · non-contrast · 0.31mm/px · 3 of 63 slices shown]
[im 21/63  brain]
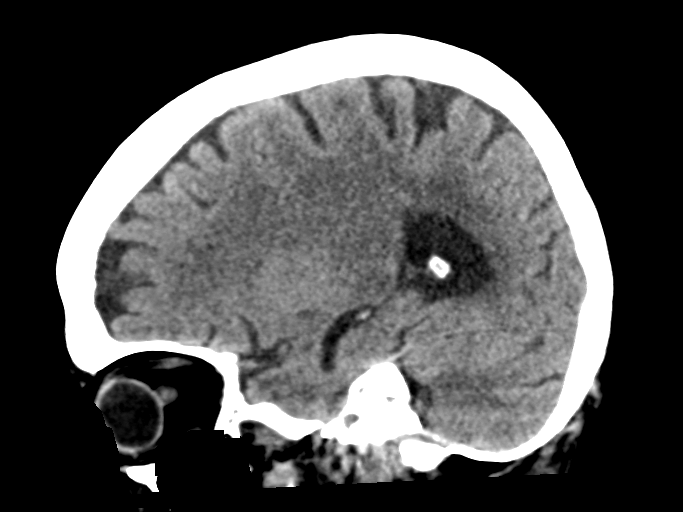
[im 32/63  brain]
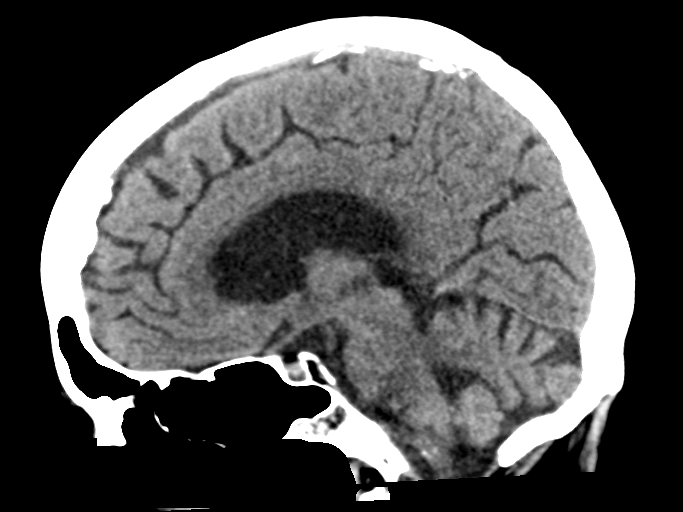
[im 42/63  brain]
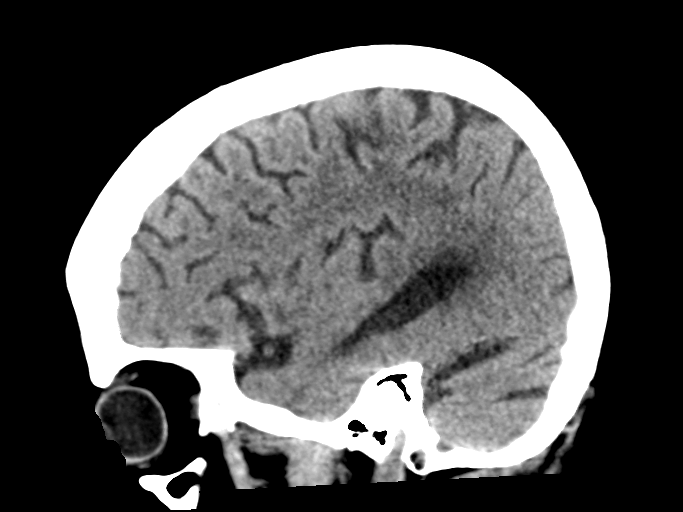

[16 of 47 positions shown; findings below may reference images not displayed]

FINDINGS: Brain: Mild cerebral atrophy. Patchy and confluent areas of
decreased attenuation are noted throughout the deep and
periventricular white matter of the cerebral hemispheres
bilaterally, compatible with chronic microvascular ischemic disease.
No evidence of acute infarction, hemorrhage, hydrocephalus,
extra-axial collection or mass lesion/mass effect.

Vascular: No hyperdense vessel or unexpected calcification.

Skull: Normal. Negative for fracture or focal lesion.

Sinuses/Orbits: No acute finding.

Other: None.
IMPRESSION: 1. No acute intracranial abnormalities.
2. Mild cerebral atrophy with chronic microvascular ischemic changes
in the cerebral white matter, as above.
